# Patient Record
Sex: Female | Born: 1965 | Race: White | Hispanic: No | Marital: Married | Smoking: Former smoker
Health system: Southern US, Community
[De-identification: ages and names within clinical notes are randomized; demographics above are authoritative.]

## PROBLEM LIST (undated history)

## (undated) DIAGNOSIS — F419 Anxiety disorder, unspecified: Secondary | ICD-10-CM

## (undated) DIAGNOSIS — K219 Gastro-esophageal reflux disease without esophagitis: Secondary | ICD-10-CM

## (undated) DIAGNOSIS — R011 Cardiac murmur, unspecified: Secondary | ICD-10-CM

## (undated) DIAGNOSIS — Z8719 Personal history of other diseases of the digestive system: Secondary | ICD-10-CM

## (undated) DIAGNOSIS — E669 Obesity, unspecified: Secondary | ICD-10-CM

## (undated) DIAGNOSIS — R112 Nausea with vomiting, unspecified: Secondary | ICD-10-CM

## (undated) DIAGNOSIS — D649 Anemia, unspecified: Secondary | ICD-10-CM

## (undated) DIAGNOSIS — E785 Hyperlipidemia, unspecified: Secondary | ICD-10-CM

## (undated) DIAGNOSIS — T7840XA Allergy, unspecified, initial encounter: Secondary | ICD-10-CM

## (undated) DIAGNOSIS — H269 Unspecified cataract: Secondary | ICD-10-CM

## (undated) DIAGNOSIS — Z86018 Personal history of other benign neoplasm: Secondary | ICD-10-CM

## (undated) DIAGNOSIS — E119 Type 2 diabetes mellitus without complications: Secondary | ICD-10-CM

## (undated) DIAGNOSIS — Z9889 Other specified postprocedural states: Secondary | ICD-10-CM

## (undated) HISTORY — DX: Anemia, unspecified: D64.9

## (undated) HISTORY — DX: Gastro-esophageal reflux disease without esophagitis: K21.9

## (undated) HISTORY — PX: MANDIBLE FRACTURE SURGERY: SHX706

## (undated) HISTORY — DX: Personal history of other benign neoplasm: Z86.018

## (undated) HISTORY — DX: Hyperlipidemia, unspecified: E78.5

## (undated) HISTORY — DX: Unspecified cataract: H26.9

## (undated) HISTORY — DX: Allergy, unspecified, initial encounter: T78.40XA

## (undated) HISTORY — DX: Cardiac murmur, unspecified: R01.1

## (undated) HISTORY — PX: CLEFT LIP REPAIR: SUR1164

## (undated) HISTORY — DX: Obesity, unspecified: E66.9

## (undated) HISTORY — DX: Type 2 diabetes mellitus without complications: E11.9

## (undated) HISTORY — PX: CATARACT EXTRACTION: SUR2

## (undated) HISTORY — PX: COLONOSCOPY: SHX174

---

## 1997-03-23 HISTORY — PX: ASD REPAIR: SHX258

## 1997-08-07 ENCOUNTER — Ambulatory Visit (HOSPITAL_COMMUNITY): Admission: RE | Admit: 1997-08-07 | Discharge: 1997-08-07 | Payer: Self-pay | Admitting: Cardiovascular Disease

## 1997-08-16 ENCOUNTER — Inpatient Hospital Stay (HOSPITAL_COMMUNITY)
Admission: RE | Admit: 1997-08-16 | Discharge: 1997-08-19 | Payer: Self-pay | Admitting: Thoracic Surgery (Cardiothoracic Vascular Surgery)

## 2001-05-23 ENCOUNTER — Encounter: Payer: Self-pay | Admitting: Family Medicine

## 2001-05-23 LAB — CONVERTED CEMR LAB
RBC count: 4.12 10*6/uL
WBC, blood: 6.2 10*3/uL

## 2004-01-29 ENCOUNTER — Ambulatory Visit: Payer: Self-pay | Admitting: Gastroenterology

## 2004-04-08 ENCOUNTER — Ambulatory Visit: Payer: Self-pay | Admitting: Gastroenterology

## 2004-06-24 ENCOUNTER — Ambulatory Visit: Payer: Self-pay | Admitting: Family Medicine

## 2004-07-07 ENCOUNTER — Ambulatory Visit: Payer: Self-pay | Admitting: Gastroenterology

## 2004-10-21 ENCOUNTER — Ambulatory Visit: Payer: Self-pay | Admitting: Unknown Physician Specialty

## 2005-02-20 ENCOUNTER — Encounter: Payer: Self-pay | Admitting: Family Medicine

## 2005-02-20 LAB — HM MAMMOGRAPHY

## 2005-03-03 ENCOUNTER — Ambulatory Visit: Payer: Self-pay | Admitting: Gastroenterology

## 2005-03-30 ENCOUNTER — Ambulatory Visit: Payer: Self-pay | Admitting: Family Medicine

## 2005-04-27 ENCOUNTER — Ambulatory Visit: Payer: Self-pay | Admitting: Family Medicine

## 2005-07-02 ENCOUNTER — Ambulatory Visit: Payer: Self-pay | Admitting: Family Medicine

## 2005-07-10 ENCOUNTER — Ambulatory Visit: Payer: Self-pay | Admitting: Family Medicine

## 2005-07-10 LAB — CONVERTED CEMR LAB
Blood Glucose, Fasting: 115 mg/dL
RBC count: 4.21 10*6/uL
WBC, blood: 5.2 10*3/uL

## 2005-08-31 ENCOUNTER — Ambulatory Visit: Payer: Self-pay | Admitting: Internal Medicine

## 2005-10-22 ENCOUNTER — Ambulatory Visit: Payer: Self-pay | Admitting: Unknown Physician Specialty

## 2006-03-25 ENCOUNTER — Ambulatory Visit: Payer: Self-pay | Admitting: Family Medicine

## 2006-04-20 ENCOUNTER — Ambulatory Visit: Payer: Self-pay | Admitting: Gastroenterology

## 2006-04-20 LAB — CONVERTED CEMR LAB
Basophils Absolute: 0.1 10*3/uL (ref 0.0–0.1)
Eosinophils Absolute: 0.2 10*3/uL (ref 0.0–0.6)
HCT: 32.5 % — ABNORMAL LOW (ref 36.0–46.0)
Hemoglobin: 11.7 g/dL — ABNORMAL LOW (ref 12.0–15.0)
Iron: 20 ug/dL — ABNORMAL LOW (ref 42–145)
Lymphocytes Relative: 32.8 % (ref 12.0–46.0)
MCHC: 35.9 g/dL (ref 30.0–36.0)
MCV: 86 fL (ref 78.0–100.0)
Monocytes Absolute: 0.8 10*3/uL — ABNORMAL HIGH (ref 0.2–0.7)
Monocytes Relative: 9 % (ref 3.0–11.0)
Neutro Abs: 4.6 10*3/uL (ref 1.4–7.7)
RDW: 14.8 % — ABNORMAL HIGH (ref 11.5–14.6)
WBC: 8.5 10*3/uL (ref 4.5–10.5)

## 2006-10-20 ENCOUNTER — Ambulatory Visit: Payer: Self-pay | Admitting: Family Medicine

## 2006-10-20 DIAGNOSIS — L255 Unspecified contact dermatitis due to plants, except food: Secondary | ICD-10-CM | POA: Insufficient documentation

## 2006-11-04 ENCOUNTER — Ambulatory Visit: Payer: Self-pay | Admitting: Unknown Physician Specialty

## 2006-11-08 ENCOUNTER — Ambulatory Visit: Payer: Self-pay | Admitting: Family Medicine

## 2007-03-30 ENCOUNTER — Ambulatory Visit: Payer: Self-pay | Admitting: Family Medicine

## 2007-03-30 DIAGNOSIS — B009 Herpesviral infection, unspecified: Secondary | ICD-10-CM | POA: Insufficient documentation

## 2007-05-02 ENCOUNTER — Ambulatory Visit: Payer: Self-pay | Admitting: Family Medicine

## 2007-05-03 ENCOUNTER — Encounter: Payer: Self-pay | Admitting: Family Medicine

## 2007-05-03 DIAGNOSIS — K219 Gastro-esophageal reflux disease without esophagitis: Secondary | ICD-10-CM | POA: Insufficient documentation

## 2007-05-03 DIAGNOSIS — Q211 Atrial septal defect: Secondary | ICD-10-CM | POA: Insufficient documentation

## 2007-05-03 DIAGNOSIS — Q375 Cleft hard and soft palate with unilateral cleft lip: Secondary | ICD-10-CM | POA: Insufficient documentation

## 2007-05-03 DIAGNOSIS — Z8679 Personal history of other diseases of the circulatory system: Secondary | ICD-10-CM

## 2007-05-03 DIAGNOSIS — Q2111 Secundum atrial septal defect: Secondary | ICD-10-CM | POA: Insufficient documentation

## 2007-05-03 HISTORY — DX: Cleft hard and soft palate with unilateral cleft lip: Q37.5

## 2007-05-03 HISTORY — DX: Personal history of other diseases of the circulatory system: Z86.79

## 2007-07-05 ENCOUNTER — Ambulatory Visit: Payer: Self-pay | Admitting: Gastroenterology

## 2007-07-05 LAB — CONVERTED CEMR LAB
ALT: 12 units/L (ref 0–35)
Basophils Absolute: 0.1 10*3/uL (ref 0.0–0.1)
Bilirubin, Direct: 0.1 mg/dL (ref 0.0–0.3)
Calcium: 8.9 mg/dL (ref 8.4–10.5)
Creatinine, Ser: 0.6 mg/dL (ref 0.4–1.2)
Eosinophils Absolute: 0.1 10*3/uL (ref 0.0–0.7)
Folate: 9.9 ng/mL
GFR calc Af Amer: 140 mL/min
Glucose, Bld: 98 mg/dL (ref 70–99)
HCT: 35.7 % — ABNORMAL LOW (ref 36.0–46.0)
Iron: 21 ug/dL — ABNORMAL LOW (ref 42–145)
MCHC: 34 g/dL (ref 30.0–36.0)
MCV: 87.9 fL (ref 78.0–100.0)
Monocytes Absolute: 0.5 10*3/uL (ref 0.1–1.0)
Neutrophils Relative %: 59.9 % (ref 43.0–77.0)
Platelets: 235 10*3/uL (ref 150–400)
Saturation Ratios: 5.1 % — ABNORMAL LOW (ref 20.0–50.0)
Sodium: 139 meq/L (ref 135–145)
Tissue Transglutaminase Ab, IgA: 0.4 units (ref <7)
Total Bilirubin: 0.3 mg/dL (ref 0.3–1.2)
Total Protein: 7.1 g/dL (ref 6.0–8.3)
Transferrin: 291.9 mg/dL (ref >=212.0)

## 2007-07-20 ENCOUNTER — Ambulatory Visit: Payer: Self-pay | Admitting: Family Medicine

## 2007-10-18 ENCOUNTER — Ambulatory Visit: Payer: Self-pay | Admitting: Family Medicine

## 2007-11-17 ENCOUNTER — Ambulatory Visit: Payer: Self-pay | Admitting: Unknown Physician Specialty

## 2008-04-03 ENCOUNTER — Ambulatory Visit: Payer: Self-pay | Admitting: Family Medicine

## 2008-05-17 ENCOUNTER — Encounter: Payer: Self-pay | Admitting: Family Medicine

## 2008-07-09 DIAGNOSIS — K449 Diaphragmatic hernia without obstruction or gangrene: Secondary | ICD-10-CM | POA: Insufficient documentation

## 2008-07-09 HISTORY — DX: Diaphragmatic hernia without obstruction or gangrene: K44.9

## 2008-07-10 ENCOUNTER — Ambulatory Visit: Payer: Self-pay | Admitting: Gastroenterology

## 2008-07-10 DIAGNOSIS — Z862 Personal history of diseases of the blood and blood-forming organs and certain disorders involving the immune mechanism: Secondary | ICD-10-CM | POA: Insufficient documentation

## 2008-07-17 ENCOUNTER — Ambulatory Visit: Payer: Self-pay | Admitting: Gastroenterology

## 2008-07-31 ENCOUNTER — Encounter: Payer: Self-pay | Admitting: Family Medicine

## 2008-09-03 ENCOUNTER — Ambulatory Visit: Payer: Self-pay | Admitting: Family Medicine

## 2008-09-03 DIAGNOSIS — J019 Acute sinusitis, unspecified: Secondary | ICD-10-CM | POA: Insufficient documentation

## 2008-09-25 ENCOUNTER — Ambulatory Visit: Payer: Self-pay | Admitting: Family Medicine

## 2008-10-11 ENCOUNTER — Ambulatory Visit: Payer: Self-pay | Admitting: Gastroenterology

## 2008-10-11 LAB — CONVERTED CEMR LAB
Basophils Absolute: 0 10*3/uL (ref 0.0–0.1)
Basophils Relative: 0.4 % (ref 0.0–3.0)
Eosinophils Absolute: 0.1 10*3/uL (ref 0.0–0.7)
Folate: 20 ng/mL
HCT: 34.1 % — ABNORMAL LOW (ref 36.0–46.0)
Hemoglobin: 11.7 g/dL — ABNORMAL LOW (ref 12.0–15.0)
Lymphocytes Relative: 40.2 % (ref 12.0–46.0)
Lymphs Abs: 2.4 10*3/uL (ref 0.7–4.0)
MCHC: 34.3 g/dL (ref 30.0–36.0)
MCV: 88.7 fL (ref 78.0–100.0)
Monocytes Absolute: 0.4 10*3/uL (ref 0.1–1.0)
Neutro Abs: 3 10*3/uL (ref 1.4–7.7)
RBC: 3.84 M/uL — ABNORMAL LOW (ref 3.87–5.11)
RDW: 14 % (ref 11.5–14.6)

## 2008-10-23 ENCOUNTER — Ambulatory Visit: Payer: Self-pay | Admitting: Ophthalmology

## 2008-10-23 ENCOUNTER — Ambulatory Visit: Payer: Self-pay | Admitting: Family Medicine

## 2008-10-23 DIAGNOSIS — H1010 Acute atopic conjunctivitis, unspecified eye: Secondary | ICD-10-CM | POA: Insufficient documentation

## 2008-11-06 ENCOUNTER — Encounter: Payer: Self-pay | Admitting: Family Medicine

## 2008-11-15 ENCOUNTER — Encounter: Payer: Self-pay | Admitting: Family Medicine

## 2008-11-23 ENCOUNTER — Encounter: Payer: Self-pay | Admitting: Family Medicine

## 2008-11-28 ENCOUNTER — Ambulatory Visit: Payer: Self-pay | Admitting: Unknown Physician Specialty

## 2009-06-21 ENCOUNTER — Ambulatory Visit: Payer: Self-pay | Admitting: Gastroenterology

## 2009-06-21 ENCOUNTER — Encounter (INDEPENDENT_AMBULATORY_CARE_PROVIDER_SITE_OTHER): Payer: Self-pay | Admitting: *Deleted

## 2009-06-21 DIAGNOSIS — K589 Irritable bowel syndrome without diarrhea: Secondary | ICD-10-CM | POA: Insufficient documentation

## 2009-06-21 DIAGNOSIS — D509 Iron deficiency anemia, unspecified: Secondary | ICD-10-CM | POA: Insufficient documentation

## 2009-06-21 HISTORY — DX: Irritable bowel syndrome, unspecified: K58.9

## 2009-06-21 LAB — CONVERTED CEMR LAB
AST: 15 units/L (ref 0–37)
Albumin: 3.8 g/dL (ref 3.5–5.2)
Alkaline Phosphatase: 44 units/L (ref 39–117)
BUN: 8 mg/dL (ref 6–23)
Basophils Relative: 0.2 % (ref 0.0–3.0)
CO2: 27 meq/L (ref 19–32)
Calcium: 8.8 mg/dL (ref 8.4–10.5)
Eosinophils Relative: 0.7 % (ref 0.0–5.0)
Ferritin: 30.5 ng/mL (ref 10.0–291.0)
GFR calc non Af Amer: 96.09 mL/min (ref >60)
Glucose, Bld: 131 mg/dL — ABNORMAL HIGH (ref 70–99)
HCT: 37.1 % (ref 36.0–46.0)
Hemoglobin: 13 g/dL (ref 12.0–15.0)
Lymphs Abs: 2.2 10*3/uL (ref 0.7–4.0)
MCV: 91.7 fL (ref 78.0–100.0)
Monocytes Absolute: 0.6 10*3/uL (ref 0.1–1.0)
Monocytes Relative: 7.8 % (ref 3.0–12.0)
Platelets: 195 10*3/uL (ref 150.0–400.0)
RBC: 4.05 M/uL (ref 3.87–5.11)
Saturation Ratios: 26.6 % (ref 20.0–50.0)
Sodium: 134 meq/L — ABNORMAL LOW (ref 135–145)
TSH: 2.67 microintl units/mL (ref 0.35–5.50)
Total Protein: 7.1 g/dL (ref 6.0–8.3)
WBC: 7.2 10*3/uL (ref 4.5–10.5)

## 2009-07-16 ENCOUNTER — Telehealth: Payer: Self-pay | Admitting: Gastroenterology

## 2009-07-17 ENCOUNTER — Ambulatory Visit: Payer: Self-pay | Admitting: Gastroenterology

## 2009-07-22 ENCOUNTER — Encounter: Payer: Self-pay | Admitting: Gastroenterology

## 2009-10-04 ENCOUNTER — Ambulatory Visit: Payer: Self-pay | Admitting: Family Medicine

## 2009-10-04 DIAGNOSIS — H6120 Impacted cerumen, unspecified ear: Secondary | ICD-10-CM | POA: Insufficient documentation

## 2009-10-29 ENCOUNTER — Encounter (INDEPENDENT_AMBULATORY_CARE_PROVIDER_SITE_OTHER): Payer: Self-pay | Admitting: *Deleted

## 2009-12-24 ENCOUNTER — Ambulatory Visit: Payer: Self-pay | Admitting: Unknown Physician Specialty

## 2010-01-15 ENCOUNTER — Ambulatory Visit: Payer: Self-pay | Admitting: Family Medicine

## 2010-01-15 DIAGNOSIS — N951 Menopausal and female climacteric states: Secondary | ICD-10-CM | POA: Insufficient documentation

## 2010-01-15 DIAGNOSIS — F4323 Adjustment disorder with mixed anxiety and depressed mood: Secondary | ICD-10-CM | POA: Insufficient documentation

## 2010-01-15 HISTORY — DX: Menopausal and female climacteric states: N95.1

## 2010-01-16 ENCOUNTER — Telehealth: Payer: Self-pay | Admitting: Family Medicine

## 2010-01-16 LAB — CONVERTED CEMR LAB
LH: 3.83 milliintl units/mL
TSH: 1.64 microintl units/mL (ref 0.35–5.50)

## 2010-01-17 ENCOUNTER — Encounter: Payer: Self-pay | Admitting: Family Medicine

## 2010-01-20 ENCOUNTER — Telehealth: Payer: Self-pay | Admitting: Family Medicine

## 2010-01-27 ENCOUNTER — Ambulatory Visit: Payer: Self-pay | Admitting: Family Medicine

## 2010-03-11 ENCOUNTER — Ambulatory Visit: Payer: Self-pay | Admitting: Family Medicine

## 2010-03-11 DIAGNOSIS — J069 Acute upper respiratory infection, unspecified: Secondary | ICD-10-CM | POA: Insufficient documentation

## 2010-04-16 ENCOUNTER — Ambulatory Visit
Admission: RE | Admit: 2010-04-16 | Discharge: 2010-04-16 | Payer: Self-pay | Source: Home / Self Care | Attending: Family Medicine | Admitting: Family Medicine

## 2010-04-22 NOTE — Procedures (Signed)
Summary: Colonoscopy  Patient: Hannah Rocha Note: All result statuses are Final unless otherwise noted.  Tests: (1) Colonoscopy (COL)   COL Colonoscopy           DONE     [facility]     [address]     [place], [place]  [address]           COLONOSCOPY PROCEDURE REPORT           PATIENT:  Hannah Rocha, Hannah Rocha  MR#:  [id number]     BIRTHDATE:  [birth year], 44 yrs. old  GENDER:  female     ENDOSCOPIST:  Vania Rea. Jarold Motto, MD, [facility]     REF. BY:     PROCEDURE DATE:  07/17/2009     PROCEDURE:  Higher-risk screening colonoscopy G0105           ASA CLASS:  Class II     INDICATIONS:  Elevated Risk Screening     MEDICATIONS:   Fentanyl 75 mcg IV, Versed 8 mg PO           DESCRIPTION OF PROCEDURE:   After the risks benefits and     alternatives of the procedure were thoroughly explained, informed     consent was obtained.  Digital rectal exam was performed and     revealed no abnormalities.   The LB CF-H180AL [id number] endoscope     was introduced through the anus and advanced to the cecum, which     was identified by both the appendix and ileocecal valve, without     limitations.  The quality of the prep was excellent, using     MoviPrep.  The instrument was then slowly withdrawn as the colon     was fully examined.     <<PROCEDUREIMAGES>>           FINDINGS:  No polyps or cancers were seen.  This was otherwise a     normal examination of the colon.   Retroflexed views in the rectum     revealed no abnormalities.    The scope was then withdrawn from     the patient and the procedure completed.           COMPLICATIONS:  None     ENDOSCOPIC IMPRESSION:     1) No polyps or cancers     2) Otherwise normal examination     RECOMMENDATIONS:     1) Continue current colorectal screening recommendations for     "routine risk" patients with a repeat colonoscopy in 10 years.     REPEAT EXAM:  No           ______________________________     Vania Rea. Jarold Motto, MD, [facility]            CC:           n.     eSIGNED:   Vania Rea. Sohrab Keelan at 07/17/2009 02:57 PM           Tamala Ser, [id number]  Note: An exclamation mark (!) indicates a result that was not dispersed into the flowsheet. Document Creation Date: 07/17/2009 2:58 PM _______________________________________________________________________  (1) Order result status: Final Collection or observation date-time: 07/17/2009 14:53 Requested date-time:  Receipt date-time:  Reported date-time:  Referring Physician:   Ordering Physician: Sheryn Bison [phone]) Specimen Source:  Source: Launa Grill Order Number: [id number] Lab site:   Appended Document: Colonoscopy    Clinical Lists Changes  Observations: Added new observation of COLONNXTDUE: 06/2019 (07/17/2009  15:11)     

## 2010-04-22 NOTE — Letter (Signed)
Summary: Nadara Eaton letter  [facility]  [address] [place], [place] [address]   Phone: [phone]  Fax: [phone]       10/29/2009 MRN: [id number]  [facility] [address] [place], [place]  [address]  Dear Ms. Dan Humphreys,  [facility] [place], and [facility] announce the retirement of Arta Silence, M.D., from full-time practice at the [facility] office effective September 19, 2009 and his plans of returning part-time.  It is important to Dr. Hetty Ely and to our practice that you understand that [facility] has seven physicians in our office for your health care needs.  We will continue to offer the same exceptional care that you have today.    Dr. Hetty Ely has spoken to many of you about his plans for retirement and returning part-time in the fall.   We will continue to work with you through the transition to schedule appointments for you in the office and meet the high standards that [place] is committed to.   Again, it is with great pleasure that we share the news that Dr. Hetty Ely will return to [facility] in October of 2011 with a reduced schedule.    If you have any questions, or would like to request an appointment with one of our physicians, please call us at [phone] and press the option for Scheduling an appointment.  We take pleasure in providing you with excellent patient care and look forward to seeing you at your next office visit.  Our [facility] Physicians are:  Tillman Abide, M.D. Laurita Quint, M.D. Roxy Manns, M.D. Kerby Nora, M.D. Hannah Beat, M.D. Ruthe Mannan, M.D. We proudly welcomed Raechel Ache, M.D. and Eustaquio Boyden, M.D. to the practice in July/August 2011.  Sincerely,  [facility]

## 2010-04-22 NOTE — Progress Notes (Signed)
Summary: prep ?'s  Phone Note Call from Patient Call back at [phone]   Caller: Patient Call For: Dr. Jarold Motto Reason for Call: Talk to Nurse Summary of Call: pt has COL tomorrow and has general ?'s about prep Initial call taken by: Vallarie Mare,  July 16, 2009 9:29 AM  Follow-up for Phone Call        Returned phone call to the patient she had questions regaurding if she could mix powdered  gator-aid with the movi prep which had already been mixed with 1 pouch A & 1 pouch B and with water.  I asked Dixie Doss, RN and she advised against adding gator-aid powder with the already mixed movi prep.  I suggested she might try to suck on hard candy if the taste was a problem.  Pt states she understands these instructions.  Pt asked what to do if she has problems with the prep if after hours.  I gave the pt. [phone] to the answering service if she needs to speak with the on call MD.  Pt has no further questions. Follow-up by: Eual Fines RN,  July 16, 2009 1:36 PM

## 2010-04-22 NOTE — Letter (Signed)
Summary: [facility] Instructions  [facility]  [address] [place], [place] [address]   Phone: [phone]  Fax: [phone]       Hannah Rocha    [birth year]    MRN: [id number]        Procedure Day /Date: Friday, 07/19/09     Arrival Time: 1:30      Procedure Time: 2:30     Location of Procedure:                    X_ _  [facility] (4th Floor)                        PREPARATION FOR COLONOSCOPY WITH MOVIPREP   Starting 5 days prior to your procedure 07/14/09 do not eat nuts, seeds, popcorn, corn, beans, peas,  salads, or any raw vegetables.  Do not take any fiber supplements (e.g. Metamucil, Citrucel, and Benefiber).  THE DAY BEFORE YOUR PROCEDURE         DATE: 07/18/09    DAY: Thursday  1.  Drink clear liquids the entire day-NO SOLID FOOD  2.  Do not drink anything colored red or purple.  Avoid juices with pulp.  No orange juice.  3.  Drink at least 64 oz. (8 glasses) of fluid/clear liquids during the day to prevent dehydration and help the prep work efficiently.  CLEAR LIQUIDS INCLUDE: Water Jello Ice Popsicles Tea (sugar ok, no milk/cream) Powdered fruit flavored drinks Coffee (sugar ok, no milk/cream) Gatorade Juice: apple, white grape, white cranberry  Lemonade Clear bullion, consomm, broth Carbonated beverages (any kind) Strained chicken noodle soup Hard Candy                             4.  In the morning, mix first dose of MoviPrep solution:    Empty 1 Pouch A and 1 Pouch B into the disposable container    Add lukewarm drinking water to the top line of the container. Mix to dissolve    Refrigerate (mixed solution should be used within 24 hrs)  5.  Begin drinking the prep at 5:00 p.m. The MoviPrep container is divided by 4 marks.   Every 15 minutes drink the solution down to the next mark (approximately 8 oz) until the full liter is complete.   6.  Follow completed prep with 16 oz of clear liquid of your choice (Nothing  red or purple).  Continue to drink clear liquids until bedtime.  7.  Before going to bed, mix second dose of MoviPrep solution:    Empty 1 Pouch A and 1 Pouch B into the disposable container    Add lukewarm drinking water to the top line of the container. Mix to dissolve    Refrigerate  THE DAY OF YOUR PROCEDURE      DATE: 07/19/09   DAY: Friday  Beginning at 9:30 a.m. (5 hours before procedure):         1. Every 15 minutes, drink the solution down to the next mark (approx 8 oz) until the full liter is complete.  2. Follow completed prep with 16 oz. of clear liquid of your choice.    3. You may drink clear liquids until 12:30  (2 HOURS BEFORE PROCEDURE).   MEDICATION INSTRUCTIONS  Unless otherwise instructed, you should take regular prescription medications with a small sip of water   as early as possible  the morning of your procedure.                  OTHER INSTRUCTIONS  You will need a responsible adult at least 46 years of age to accompany you and drive you home.   This person must remain in the waiting room during your procedure.  Wear loose fitting clothing that is easily removed.  Leave jewelry and other valuables at home.  However, you may wish to bring a book to read or  an iPod/MP3 player to listen to music as you wait for your procedure to start.  Remove all body piercing jewelry and leave at home.  Total time from sign-in until discharge is approximately 2-3 hours.  You should go home directly after your procedure and rest.  You can resume normal activities the  day after your procedure.  The day of your procedure you should not:   Drive   Make legal decisions   Operate machinery   Drink alcohol   Return to work  You will receive specific instructions about eating, activities and medications before you leave.    The above instructions have been reviewed and explained to me by   _______________________    I fully understand and can  verbalize these instructions _____________________________ Date _________

## 2010-04-22 NOTE — Letter (Signed)
Summary: Patient Notice-Endo Biopsy Results  [facility]  [address] [place], [place] [address]   Phone: [phone]  Fax: [phone]        Jul 22, 2009 MRN: [id number]    [facility] [address] [place], [place]  [address]    Dear Ms. Dan Humphreys,  I am pleased to inform you that the biopsies taken during your recent endoscopic examination did not show any evidence of cancer upon pathologic examination.  Additional information/recommendations:  __No further action is needed at this time.  Please follow-up with      your primary care physician for your other healthcare needs.  __ Please call [phone] to schedule a return visit to review      your condition.  XX__ Continue with the treatment plan as outlined on the day of your      exam.  __ You should have a repeat endoscopic examination for this problem              in _ months/years.   Please call us if you are having persistent problems or have questions about your condition that have not been fully answered at this time.  Sincerely,  Mardella Layman MD [facility]  This letter has been electronically signed by your physician.  Appended Document: Patient Notice-Endo Biopsy Results letter mailed 5.5.11

## 2010-04-22 NOTE — Procedures (Signed)
Summary: Upper Endoscopy  Patient: Hannah Rocha Note: All result statuses are Final unless otherwise noted.  Tests: (1) Upper Endoscopy (EGD)   EGD Upper Endoscopy       DONE     [facility]     [address]     [place], [place]  [address]           ENDOSCOPY PROCEDURE REPORT           PATIENT:  [name], [name]  MR#:  [id number]     BIRTHDATE:  09/16/1965, 44 yrs. old  GENDER:  female           ENDOSCOPIST:  Vania Rea. Jarold Motto, MD, [facility]     Referred by:           PROCEDURE DATE:  07/17/2009     PROCEDURE:  EGD with biopsy     ASA CLASS:  Class II     INDICATIONS:  CHRONIC GERD           MEDICATIONS:   There was residual sedation effect present from     prior procedure., Fentanyl 25 mcg IV, Versed 2 mg IV     TOPICAL ANESTHETIC:           DESCRIPTION OF PROCEDURE:   After the risks benefits and     alternatives of the procedure were thoroughly explained, informed     consent was obtained.  The [facility] GIF-H180 [id number] endoscope was     introduced through the mouth and advanced to the second portion of     the duodenum, without limitations.  The instrument was slowly     withdrawn as the mucosa was fully examined.     <<PROCEDUREIMAGES>>           ULTRASONIC FINDINGS:   A hiatal hernia was found.  CM. HH NOTED.     Normal GE junction was noted.  There were multiple polyps     identified. ENTIRE POLYP REMOVED FOR EXAM.MULTIPLE BENIGN FUNDAL     HYPERPLASTIC POLYPS NOTED.  The duodenal bulb was normal in     appearance, as was the postbulbar duodenum.    POLYPS  The scope     was then withdrawn from the patient and the procedure completed.           COMPLICATIONS:  None           ENDOSCOPIC IMPRESSION:     1) Normal GE junction     2) Hiatal hernia     3) Polyps, multiple     4) Normal duodenum     1. NO BARRETT'S MUCOSA     2. 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     RECOMMENDATIONS:     1) Await biopsy results     2) continue current medications        REPEAT EXAM:  No           ______________________________     Vania Rea. Jarold Motto, MD, [facility]           CC:           n.     eSIGNED:   Vania Rea. Patterson at 07/17/2009 03:09 PM           Tamala Ser, [id number]  Note: An exclamation mark (!) indicates a result that was not dispersed into the flowsheet. Document Creation Date: 07/17/2009 3:09 PM _______________________________________________________________________  (1) Order result status: Final Collection or observation date-time: 07/17/2009 15:03 Requested date-time:  Receipt date-time:  Reported date-time:  Referring Physician:   Ordering Physician: Sheryn Bison [phone]) Specimen Source:  Source: Launa Grill Order Number: [id number] Lab site:

## 2010-04-22 NOTE — Assessment & Plan Note (Signed)
Summary: DISCUSS MEDICATION/CLE   Vital Signs:  Patient profile:   45 year old female Height:      64 inches Weight:      171 pounds BMI:     29.46 Temp:     98.6 degrees F oral Pulse rate:   76 / minute Pulse rhythm:   regular BP sitting:   150 / 90  (right arm) Cuff size:   regular  Vitals Entered By: [name] CMA Duncan Dull) (January 27, 2010 3:14 PM) CC: discuss medications   History of Present Illness: 45 yo here to to discuss anxiety/depression.  Saw her on 01/15/2010, started on Zoloft 50 mg daily for anxiety and tearfulness. Stopped taking it after 9 days, made her to sick on her stomach and actually made her more nervous. Referred for psychotherapy, but she could not afford to go.  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 Father is doing better, now has [employer].  Does still have what she thinks are panic attacks- starts thinking about things and gets very tearful and hyperventilates.  Talked to her husband about it and they came up with strategies to calm herself down. No SI or HI.    Current Medications (verified): 1)  Protonix 40 Mg  Tbec (Pantoprazole Sodium) .... Take One By Mouth Daily 2)  Zovirax 5 %  Crea (Acyclovir) .... Apply 5x Per Day For Four Days As Needed. 3)  Necon 1/35 (28) 1-35 Mg-Mcg  Tabs (Norethindrone-Eth Estradiol) .... Take As Directed 4)  Tandem Plus 162-115.2-1 Mg  Caps (Fefum-Fepo-Fa-B Cmp-C-Zn-Mn-Cu) .... Take One Capsule By Mouth Daily 5)  Protonix 40 Mg Tbec (Pantoprazole Sodium) .... Take 1 Tablet By Mouth Once A Day 6)  Bcp .... Use As Directed 7)  Omnicef 300 Mg Caps (Cefdinir) .... 2 Tabs By Mouth Once A Day 8)  Protonix 40 Mg  Tbec (Pantoprazole Sodium) .Marland Kitchen.. 1 Each Day 30 Minutes Before Meal 9)  Loestrin 24 Fe 1-20 Mg-Mcg Tabs (Norethin Ace-Eth Estrad-Fe) .... One Tablet By Mouth Once Daily 10)  Alavert 10 Mg Tabs (Loratadine) .... One Tablet By Mouth Once Daily 11)  Se-Tan Plus 162-115.2-1 Mg Caps  (Fefum-Fepo-Fa-B Cmp-C-Zn-Mn-Cu) .... Take One Tablet By Mouth Daily 12)  Alprazolam 0.25 Mg Tabs (Alprazolam) .Marland Kitchen.. 1 Tab By Mouth Three Times A Day As Needed Anxiety.  Allergies (verified): 1)  ! Zoloft  Review of Systems      See HPI Psych:  Complains of anxiety, easily tearful, and panic attacks; denies depression, suicidal thoughts/plans, thoughts of violence, unusual visions or sounds, and thoughts /plans of harming others.  Physical Exam  General:  Well developed, well nourished, no acute distress.healthy appearing.   Psych:  Cognition and judgment appear intact. Alert and cooperative with normal attention span and concentration. No apparent delusions, illusions, hallucinations Tearful but very appropriate, anxious.     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 Has more difficulty with anxiety than depression. Discussed Buspar, I feel she would do quite well on that but she would like to try to deal with it on her own first. Does want something in case she has a severe panic attack because when she becomes short of breath, it really scares her. Will use very low dose as needed alprazolam but explained this is not meant for daily or  chronic use.  Pt in agreement with plan, will call me in a few weeks.  If she is still as anxious, she will try Buspar.  Complete Medication List: 1)  Protonix 40 Mg Tbec (Pantoprazole sodium) .... Take one by mouth daily 2)  Zovirax 5 % Crea (Acyclovir) .... Apply 5x per day for four days as needed. 3)  Necon 1/35 (28) 1-35 Mg-mcg Tabs (Norethindrone-eth estradiol) .... Take as directed 4)  Tandem Plus 162-115.2-1 Mg Caps (Fefum-fepo-fa-b cmp-c-zn-mn-cu) .... Take one capsule by mouth daily 5)  Protonix 40 Mg Tbec (Pantoprazole sodium) .... Take 1 tablet by  mouth once a day 6)  Bcp  .... Use as directed 7)  Omnicef 300 Mg Caps (Cefdinir) .... 2 tabs by mouth once a day 8)  Protonix 40 Mg Tbec (Pantoprazole sodium) .Marland Kitchen.. 1 each day 30 minutes before meal 9)  Loestrin 24 Fe 1-20 Mg-mcg Tabs (Norethin ace-eth estrad-fe) .... One tablet by mouth once daily 10)  Alavert 10 Mg Tabs (Loratadine) .... One tablet by mouth once daily 11)  Se-tan Plus 162-115.2-1 Mg Caps (Fefum-fepo-fa-b cmp-c-zn-mn-cu) .... Take one tablet by mouth daily 12)  Alprazolam 0.25 Mg Tabs (Alprazolam) .Marland Kitchen.. 1 tab by mouth three times a day as needed anxiety. Prescriptions: ALPRAZOLAM 0.25 MG TABS (ALPRAZOLAM) 1 tab by mouth three times a day as needed anxiety.  #30 x 0   Entered and Authorized by:   Ruthe Mannan MD   Signed by:   Ruthe Mannan MD on 01/27/2010   Method used:   Print then Give to Patient   RxID:   [id number]    Orders Added: 1)  Est. Patient Level IV [14782]    Current Allergies (reviewed today): ! ZOLOFT

## 2010-04-22 NOTE — Assessment & Plan Note (Signed)
Summary: female issues   Vital Signs:  Patient profile:   45 year old female Height:      64 inches Weight:      173 pounds BMI:     29.80 Temp:     98.2 degrees F oral Pulse rate:   80 / minute Pulse rhythm:   regular BP sitting:   130 / 82  (right arm) Cuff size:   regular  Vitals Entered By: [name] CMA Duncan Dull) (January 15, 2010 11:59 AM) CC: personal issues, menopause questions   History of Present Illness: 45 yo here for ?menopause.  Last several months, has had 2-3 months between periods, sometimes very light and othertimes very heavy.  Was placed on OCPs by her OBGYN. Discussed these symptoms with him as well. Lately, has been very tearful, difficulty sleeping and concentrating.  Very anxious.  Some vaginal dryness and hot sweats.    Current Medications (verified): 1)  Protonix 40 Mg  Tbec (Pantoprazole Sodium) .... Take One By Mouth Daily 2)  Zovirax 5 %  Crea (Acyclovir) .... Apply 5x Per Day For Four Days As Needed. 3)  Necon 1/35 (28) 1-35 Mg-Mcg  Tabs (Norethindrone-Eth Estradiol) .... Take As Directed 4)  Tandem Plus 162-115.2-1 Mg  Caps (Fefum-Fepo-Fa-B Cmp-C-Zn-Mn-Cu) .... Take One Capsule By Mouth Daily 5)  Protonix 40 Mg Tbec (Pantoprazole Sodium) .... Take 1 Tablet By Mouth Once A Day 6)  Bcp .... Use As Directed 7)  Omnicef 300 Mg Caps (Cefdinir) .... 2 Tabs By Mouth Once A Day 8)  Protonix 40 Mg  Tbec (Pantoprazole Sodium) .Marland Kitchen.. 1 Each Day 30 Minutes Before Meal 9)  Loestrin 24 Fe 1-20 Mg-Mcg Tabs (Norethin Ace-Eth Estrad-Fe) .... One Tablet By Mouth Once Daily 10)  Alavert 10 Mg Tabs (Loratadine) .... One Tablet By Mouth Once Daily 11)  Se-Tan Plus 162-115.2-1 Mg Caps (Fefum-Fepo-Fa-B Cmp-C-Zn-Mn-Cu) .... Take One Tablet By Mouth Daily 12)  Zoloft 50 Mg Tabs (Sertraline Hcl) .Marland Kitchen.. 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  Alcohol Use - yes occasionally Daily Caffeine Use Illicit Drug Use - no  Risk Factors: Smoking Status: never (06/21/2009)  Review of Systems      See HPI Psych:  Denies panic attacks, sense of great danger, suicidal thoughts/plans, thoughts of violence, unusual visions or sounds, and thoughts /plans of harming others. Endo:  Denies cold intolerance and heat intolerance.   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 Explained that LH and FSH may be skewed by OCPs but will check today.  Pt in agreement. Will start Zolof today, see below.   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  Discussed psychotherapy, she says they have a free counselor at work that she will talk to.  Follow up in one month.  Complete Medication List: 1)  Protonix 40 Mg Tbec (Pantoprazole sodium) .... Take one by mouth  daily 2)  Zovirax 5 % Crea (Acyclovir) .... Apply 5x per day for four days as needed. 3)  Necon 1/35 (28) 1-35 Mg-mcg Tabs (Norethindrone-eth estradiol) .... Take as directed 4)  Tandem Plus 162-115.2-1 Mg Caps (Fefum-fepo-fa-b cmp-c-zn-mn-cu) .... Take one capsule by mouth daily 5)  Protonix 40 Mg Tbec (Pantoprazole sodium) .... Take 1 tablet by mouth once a day 6)  Bcp  .... Use as directed 7)  Omnicef 300 Mg Caps  (Cefdinir) .... 2 tabs by mouth once a day 8)  Protonix 40 Mg Tbec (Pantoprazole sodium) .Marland Kitchen.. 1 each day 30 minutes before meal 9)  Loestrin 24 Fe 1-20 Mg-mcg Tabs (Norethin ace-eth estrad-fe) .... One tablet by mouth once daily 10)  Alavert 10 Mg Tabs (Loratadine) .... One tablet by mouth once daily 11)  Se-tan Plus 162-115.2-1 Mg Caps (Fefum-fepo-fa-b cmp-c-zn-mn-cu) .... Take one tablet by mouth daily 12)  Zoloft 50 Mg Tabs (Sertraline hcl) .Marland Kitchen.. 1 by mouth daily  Other Orders: Admin 1st Vaccine (36644) Flu Vaccine 82yrs + (03474)  Patient Instructions: 1)  Please make an appt to see me in one month. Prescriptions: ZOLOFT 50 MG TABS (SERTRALINE HCL) 1 by mouth daily  #30 x 6   Entered and Authorized by:   Ruthe Mannan MD   Signed by:   Ruthe Mannan MD on 01/15/2010   Method used:   [facility]        [facility] [address] [id number]       [address]       [place], [place]  [address]       Ph: [phone] or [phone]       Fax: [phone]   RxID:   [id number]    Orders Added: 1)  Admin 1st Vaccine [90471] 2)  Flu Vaccine 33yrs + [83151] 3)  Venipuncture [76160] 4)  TLB-TSH (Thyroid Stimulating Hormone) [84443-TSH] 5)  TLB-FSH (Follicle Stimulating Hormone) [83001-FSH] 6)  TLB-Luteinizing Hormone (LH) [83002-LH] 7)  Est. Patient Level IV [73710]     ACurrent Allergies (reviewed today): No known allergies  Flu Vaccine Consent Questions     Do you have a history of severe allergic reactions to this vaccine? no    Any prior history of allergic reactions to egg and/or gelatin? no    Do you have a sensitivity to the preservative Thimersol? no    Do you have a past history of Guillan-Barre Syndrome? no    Do you currently have an acute febrile illness? no    Have you ever had a severe reaction to latex? no    Vaccine information given and explained to patient? yes    Are you currently pregnant? no    Lot Number:[id number]   Exp Date:09/20/2010   Site  Given  Left Deltoid IM

## 2010-04-22 NOTE — Assessment & Plan Note (Signed)
Summary: 1 yr. f/u-med. refill/ch.   History of Present Illness Primary GI MD: Sheryn Bison MD FACP FAGA Chief Complaint: yearly f/u, Protonix refill. No problems at this time. History of Present Illness:   45 year old Caucasian female with chronic iron deficiency suspected from menorrhagia resolved daily and therapy. She denies lower GI complaints but does have a family history of colon polyps in her mother. She has chronic reflux symptoms managed by daily Protonix 40 mg, and denies current symptomatology or dysphagia. She does have a lot of abdominal gas and bloating and uses a large amount of p.o. fructose and sorbitol. She denies rectal bleeding, melena, alcohol, cigarette, or NSAID use. She has not had previous colonoscopy. Last endoscopy was completed in 1999.   GI Review of Systems      Denies abdominal pain, acid reflux, belching, bloating, chest pain, dysphagia with liquids, dysphagia with solids, heartburn, loss of appetite, nausea, vomiting, vomiting blood, weight loss, and  weight gain.        Denies anal fissure, black tarry stools, change in bowel habit, constipation, diarrhea, diverticulosis, fecal incontinence, heme positive stool, hemorrhoids, irritable bowel syndrome, jaundice, light color stool, liver problems, rectal bleeding, and  rectal pain. Preventive Screening-Counseling & Management  Alcohol-Tobacco     Smoking Status: never      Drug Use:  no.      Current Medications (verified): 1)  Protonix 40 Mg  Tbec (Pantoprazole Sodium) .Marland Kitchen.. 1 Each Day 30 Minutes Before Meal 2)  Loestrin 24 Fe 1-20 Mg-Mcg Tabs (Norethin Ace-Eth Estrad-Fe) .... One Tablet By Mouth Once Daily 3)  Alavert 10 Mg Tabs (Loratadine) .... One Tablet By Mouth Once Daily  Allergies (verified): No Known Drug Allergies  Past History:  Past medical, surgical, family and social histories (including risk factors) reviewed for relevance to current acute and chronic problems.  Past Medical  History: Anemia GERD Allergies  Past Surgical History: Reviewed history from 06/19/2009 and no changes required. 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 Family History of Prostate Cancer:Father Family History of Heart Disease: Mother, Father-MI Family History of Colon Polyps:Mother  Social History: Reviewed history and no changes required. Married Patient has never smoked.  Alcohol Use - yes occasionally Daily Caffeine Use Illicit Drug Use - no Smoking Status:  never Drug Use:  no  Review of Systems       The patient complains of vision changes.  The patient denies allergy/sinus, anemia, anxiety-new, arthritis/joint pain, back pain, blood in urine, breast changes/lumps, change in vision, confusion, cough, coughing up blood, depression-new, fainting, fatigue, fever, headaches-new, hearing problems, heart murmur, heart rhythm changes, itching, menstrual pain, muscle pains/cramps, night sweats, nosebleeds, pregnancy symptoms, shortness of breath, skin rash, sleeping problems, sore throat, swelling of feet/legs, swollen lymph glands, thirst - excessive , urination - excessive , urination changes/pain, urine leakage, and voice change.    Vital Signs:  Patient profile:   45 year old female Height:      64 inches Weight:      180.50 pounds BMI:     31.09 Pulse rate:   88 / minute Pulse rhythm:   regular BP sitting:   124 / 72  (right arm) Cuff size:   regular  Vitals Entered By: Christie Nottingham CMA [facility] Dull) (June 21, 2009 11:32 AM)  Physical Exam  General:  Well developed, well nourished, no acute distress.healthy appearing.   Head:  Normocephalic and atraumatic. Eyes:  PERRLA, no  icterus.exam deferred to patient's ophthalmologist.   Lungs:  Clear throughout to auscultation. Heart:  Regular rate and rhythm; no murmurs, rubs,  or bruits. Abdomen:  Soft, nontender and nondistended. No masses,  hepatosplenomegaly or hernias noted. Normal bowel sounds. Extremities:  No clubbing, cyanosis, edema or deformities noted. Neurologic:  Alert and  oriented x4;  grossly normal neurologically. Inguinal Nodes:  No significant inguinal adenopathy. Psych:  Alert and cooperative. Normal mood and affect.   Impression & Recommendations:  Problem # 1:  ESOPHAGEAL REFLUX (ICD-530.81) Assessment Improved Continue daily Protonix with followup endoscopy and followup labs. Orders: TLB-CBC Platelet - w/Differential (85025-CBCD) TLB-BMP (Basic Metabolic Panel-BMET) (80048-METABOL) TLB-Hepatic/Liver Function Pnl (80076-HEPATIC) TLB-TSH (Thyroid Stimulating Hormone) (84443-TSH) TLB-B12, Serum-Total ONLY (81191-Y78) TLB-Ferritin (82728-FER) TLB-Folic Acid (Folate) (82746-FOL) TLB-IBC Pnl (Iron/FE;Transferrin) (83550-IBC) Colon/Endo (Colon/Endo)  Problem # 2:  FAMILY HX OF COLONIC POLYPS (ICD-V18.51) Assessment: Unchanged  Colonoscopy scheduled at her convenience.  Orders: Colon/Endo (Colon/Endo)  Problem # 3:  ANEMIA-IRON DEFICIENCY (ICD-280.9) Assessment: Unchanged  Endoscopy, colonoscopy, and repeat labs scheduled.  Orders: Colon/Endo (Colon/Endo)  Patient Instructions: 1)  You are being scheduled for a colonoscopy. 2)  Please go to the basement for lab work. 3)  Please continue current medications.  4)  The medication list was reviewed and reconciled.  All changed / newly prescribed medications were explained.  A complete medication list was provided to the patient / caregiver. Prescriptions: MOVIPREP 100 GM  SOLR (PEG-KCL-NACL-NASULF-NA ASC-C) As per prep instructions.  #1 x 0   Entered by:   Ashok Cordia RN   Authorized by:   Mardella Layman MD [facility]   Signed by:   Ashok Cordia RN on 06/21/2009   Method used:   [facility]        [facility] [facility]       [address]       [place], [place]  [address]       Ph: [phone] or [phone]        Fax: [phone]   RxID:   [id number]

## 2010-04-22 NOTE — Progress Notes (Signed)
Summary: zoloft   Phone Note Call from Patient Call back at [phone]   Caller: Patient Call For: Dr. Dayton Martes  Summary of Call: Patient was in yesterday and was given rx for zoloft. She took it for the first time this morning. Patient says that after taking it she felt "really funky", her mouth is "dry as a bone", and feels very anxious. She is asking if it is normal to feel this way or if she should try something else. [facility]  Initial call taken by: Melody Comas,  January 16, 2010 11:45 AM  Follow-up for Phone Call        yes as we discussed, can have those side effects for the first week, usually resolves completely. Ruthe Mannan MD  January 16, 2010 11:48 AM  Patient advised as instructed via telephone.    Follow-up by: [name] CMA Duncan Dull),  January 16, 2010 11:50 AM

## 2010-04-22 NOTE — Assessment & Plan Note (Signed)
Summary: ears stopped up/nt   Vital Signs:  Patient profile:   45 year old female Height:      64 inches Weight:      180 pounds BMI:     31.01 Temp:     98.7 degrees F oral Pulse rate:   84 / minute Pulse rhythm:   regular BP sitting:   140 / 82  (left arm) Cuff size:   regular  Vitals Entered By: [name] CMA Duncan Dull) (October 04, 2009 12:20 PM) CC: right ear pain   History of Present Illness: 45 yo here for right ear pain and decreased hearing for 1 month. No runny nose, sore throat, sneezing, cough, fevers or other URI symptoms.   Current Medications (verified): 1)  Protonix 40 Mg  Tbec (Pantoprazole Sodium) .... Take One By Mouth Daily 2)  Zovirax 5 %  Crea (Acyclovir) .... Apply 5x Per Day For Four Days As Needed. 3)  Necon 1/35 (28) 1-35 Mg-Mcg  Tabs (Norethindrone-Eth Estradiol) .... Take As Directed 4)  Tandem Plus 162-115.2-1 Mg  Caps (Fefum-Fepo-Fa-B Cmp-C-Zn-Mn-Cu) .... Take One Capsule By Mouth Daily 5)  Protonix 40 Mg Tbec (Pantoprazole Sodium) .... Take 1 Tablet By Mouth Once A Day 6)  Bcp .... Use As Directed 7)  Omnicef 300 Mg Caps (Cefdinir) .... 2 Tabs By Mouth Once A Day 8)  Protonix 40 Mg  Tbec (Pantoprazole Sodium) .Marland Kitchen.. 1 Each Day 30 Minutes Before Meal 9)  Loestrin 24 Fe 1-20 Mg-Mcg Tabs (Norethin Ace-Eth Estrad-Fe) .... One Tablet By Mouth Once Daily 10)  Alavert 10 Mg Tabs (Loratadine) .... One Tablet By Mouth Once Daily  Allergies (verified): No Known Drug Allergies  Review of Systems      See HPI General:  Denies fever. ENT:  Complains of decreased hearing and earache; denies difficulty swallowing, ear discharge, postnasal drainage, ringing in ears, sinus pressure, and sore throat. Resp:  Denies cough.  Physical Exam  General:  Well developed, well nourished, no acute distress.healthy appearing.   Ears:  R cerumen impaction.   s/p cerumen removal, TM normal. Left ear normal. Mouth:  Oral mucosa and oropharynx without lesions or exudates.   Teeth in good repair.  Lungs:  Normal respiratory effort, chest expands symmetrically. Lungs are clear to auscultation, no crackles or wheezes. Heart:  Normal rate and regular rhythm. S1 and S2 normal without gallop, murmur, click, rub or other extra sounds. Psych:  Cognition and judgment appear intact. Alert and cooperative with normal attention span and concentration. No apparent delusions, illusions, hallucinations   Impression & Recommendations:  Problem # 1:  CERUMEN IMPACTION, RIGHT (ICD-380.4) Assessment New  Cerumen removed with curette and water irrigation. Hearing and pain immediately resolved once cerumen removed.  Orders: Cerumen Impaction Removal (16109)  Complete Medication List: 1)  Protonix 40 Mg Tbec (Pantoprazole sodium) .... Take one by mouth daily 2)  Zovirax 5 % Crea (Acyclovir) .... Apply 5x per day for four days as needed. 3)  Necon 1/35 (28) 1-35 Mg-mcg Tabs (Norethindrone-eth estradiol) .... Take as directed 4)  Tandem Plus 162-115.2-1 Mg Caps (Fefum-fepo-fa-b cmp-c-zn-mn-cu) .... Take one capsule by mouth daily 5)  Protonix 40 Mg Tbec (Pantoprazole sodium) .... Take 1 tablet by mouth once a day 6)  Bcp  .... Use as directed 7)  Omnicef 300 Mg Caps (Cefdinir) .... 2 tabs by mouth once a day 8)  Protonix 40 Mg Tbec (Pantoprazole sodium) .Marland Kitchen.. 1 each day 30 minutes before meal 9)  Loestrin 24 Fe  1-20 Mg-mcg Tabs (Norethin ace-eth estrad-fe) .... One tablet by mouth once daily 10)  Alavert 10 Mg Tabs (Loratadine) .... One tablet by mouth once daily   Current Allergies (reviewed today): No known allergies

## 2010-04-22 NOTE — Progress Notes (Signed)
Summary: Cannot afford to see a therapist  Phone Note Call from Patient Call back at [phone] cell   Caller: Patient Call For: Dr. Dayton Martes Summary of Call: Patient called and stated that Dr. Dayton Martes thought it may be a good idea for her to go see a therapist.  She says that she cannot afford to do this right now because her husband lost his job and found a new one but his pay is almost cut in half from what he was making.  She says that maybe she can see a therapist later on but right now is out of the question.  Please advise. Initial call taken by: [name] CMA Duncan Dull),  January 20, 2010 9:34 AM  Follow-up for Phone Call        Noted.  Thank you. Ruthe Mannan MD  January 20, 2010 9:48 AM  Patient advised as instructed via telephone.  She will call back when she can afford to see a therapist.  Follow-up by: [name] CMA Duncan Dull),  January 20, 2010 10:06 AM

## 2010-04-22 NOTE — Letter (Signed)
Summary: Out of Work  [employer] [employer] at [facility]  [address] [place], [place] [address]   Phone: [phone]  Fax: [phone]    January 17, 2010   Employee:  Hannah Rocha    To Whom It May Concern:   For Medical reasons, please excuse the above named employee from work for the following dates:  Start:   01/14/2010-01/16/2010  End:   May return to work on 01/17/2010, earlier if she feels up to it.  If you need additional information, please feel free to contact our office.         Sincerely,      [name] CMA [facility]) for Dr. Ruthe Mannan

## 2010-04-24 NOTE — Assessment & Plan Note (Signed)
Summary: EARS FEEL STOPPED UP/ LB   Vital Signs:  Patient profile:   45 year old female Height:      64 inches Weight:      172.25 pounds BMI:     29.67 Temp:     98.6 degrees F oral Pulse rate:   81 / minute Pulse rhythm:   regular BP sitting:   140 / 80  (right arm) Cuff size:   regular  Vitals Entered By: [name] CMA Duncan Dull) (April 16, 2010 12:23 PM) CC: ears stopped up   History of Present Illness: 45 yo with 2 weeks of progressive URI symptoms.  coughing  and sneezing.   Now both ear full, right ear hurt so much last night she could not sleep. +sinus pressure. Chills, no fever that she is aware of.  No sputum production or wheezing.  Current Medications (verified): 1)  Protonix 40 Mg Tbec (Pantoprazole Sodium) .... Take 1 Tablet By Mouth Once A Day 2)  Loestrin 24 Fe 1-20 Mg-Mcg Tabs (Norethin Ace-Eth Estrad-Fe) .... One Tablet By Mouth Once Daily 3)  Alavert 10 Mg Tabs (Loratadine) .... One Tablet By Mouth Once Daily 4)  Se-Tan Plus 162-115.2-1 Mg Caps (Fefum-Fepo-Fa-B Cmp-C-Zn-Mn-Cu) .... Take One Tablet By Mouth Daily 5)  Alprazolam 0.25 Mg Tabs (Alprazolam) .Marland Kitchen.. 1 Tab By Mouth Three Times A Day As Needed Anxiety. 6)  Augmentin 875-125 Mg Tabs (Amoxicillin-Pot Clavulanate) .Marland Kitchen.. 1 By Mouth 2 Times Daily X 10 Days  Allergies: 1)  ! 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  Alcohol Use - yes occasionally Daily Caffeine Use Illicit Drug Use - no  Risk Factors: Smoking Status: never (06/21/2009)  Review of Systems      See HPI General:   Complains of chills; denies fever. ENT:  Complains of earache; denies ear discharge. Resp:  Complains of cough; denies shortness of breath, sputum productive, and wheezing.  Physical Exam  General:  GEN: nad, alert and oriented HEENT: mucous membranes moist, TM w/o erythema, nasal epithelium injected, OP with cobblestoning NECK: supple w/o LA CV: rrr. PULM: ctab, no inc wob ABD: soft, +bs EXT: no edema     Impression & Recommendations:  Problem # 1:  URI (ICD-465.9) Assessment New Given duration and progression of symptoms, will treat for bacterial sinusitis. Supportive care as per pt instructions. Her updated medication list for this problem includes:    Alavert 10 Mg Tabs (Loratadine) ..... One tablet by mouth once daily  Complete Medication List: 1)  Protonix 40 Mg Tbec (Pantoprazole sodium) .... Take 1 tablet by mouth once a day 2)  Loestrin 24 Fe 1-20 Mg-mcg Tabs (Norethin ace-eth estrad-fe) .... One tablet by mouth once daily 3)  Alavert 10 Mg Tabs (Loratadine) .... One tablet by mouth once daily 4)  Se-tan Plus 162-115.2-1 Mg Caps (Fefum-fepo-fa-b cmp-c-zn-mn-cu) .... Take one tablet by mouth daily 5)  Alprazolam 0.25 Mg Tabs (Alprazolam) .Marland Kitchen.. 1 tab by mouth three times a day as needed anxiety. 6)  Augmentin 875-125 Mg Tabs (Amoxicillin-pot clavulanate) .Marland Kitchen.. 1 by mouth 2 times daily x 10 days  Patient Instructions: 1)  Take antibiotic as directed.  Drink lots of fluids.  Treat sympotmatically with Mucinex, nasal saline irrigation, and Tylenol/Ibuprofen. Also try claritin D or zyrtec D over the counter- two times a day as needed ( have to sign for them at pharmacy). You can use warm compresses.  Cough suppressant at night. Call if not improving as expected in 5-7 days.  Prescriptions: AUGMENTIN 875-125 MG TABS (AMOXICILLIN-POT CLAVULANATE) 1 by mouth 2 times daily x 10 days  #20 x 0   Entered and Authorized by:   Ruthe Mannan MD   Signed by:   Ruthe Mannan MD on 04/16/2010    Method used:   [facility]        [facility]       [address]       [place], [place]  [address]       Ph: [phone] or [phone]       Fax: [phone]   RxID:   [id number]    Orders Added: 1)  Est. Patient Level III [59563]    Current Allergies (reviewed today): ! ZOLOFT

## 2010-04-24 NOTE — Assessment & Plan Note (Signed)
Summary: CHEST CONGESTION,COUGH/CLE   Vital Signs:  Patient profile:   45 year old female Height:      64 inches Weight:      176.75 pounds BMI:     30.45 Temp:     98.8 degrees F oral Pulse rate:   88 / minute Pulse rhythm:   regular BP sitting:   152 / 86  (left arm) Cuff size:   regular  Vitals Entered By: Delilah Shan CMA Duncan Dull) (March 11, 2010 4:08 PM) CC: Chest congestion   History of Present Illness: Feeling better today than yesterday.  Had green sputum over last 2 weeks. Increased earlier in the week.  Had been coughing more and sneezing.  No FCNAV.  Occ R ear pain over last few days.  She can hear better from L ear.  H/o ETD.    Current Medications (verified): 1)  Zovirax 5 %  Crea (Acyclovir) .... Apply 5x Per Day For Four Days As Needed. 2)  Tandem Plus 162-115.2-1 Mg  Caps (Fefum-Fepo-Fa-B Cmp-C-Zn-Mn-Cu) .... Take One Capsule By Mouth Daily 3)  Protonix 40 Mg Tbec (Pantoprazole Sodium) .... Take 1 Tablet By Mouth Once A Day 4)  Loestrin 24 Fe 1-20 Mg-Mcg Tabs (Norethin Ace-Eth Estrad-Fe) .... One Tablet By Mouth Once Daily 5)  Alavert 10 Mg Tabs (Loratadine) .... One Tablet By Mouth Once Daily 6)  Se-Tan Plus 162-115.2-1 Mg Caps (Fefum-Fepo-Fa-B Cmp-C-Zn-Mn-Cu) .... Take One Tablet By Mouth Daily 7)  Alprazolam 0.25 Mg Tabs (Alprazolam) .Marland Kitchen.. 1 Tab By Mouth Three Times A Day As Needed Anxiety.  Allergies: 1)  ! Zoloft  Review of Systems       See HPI.  Otherwise negative.    Physical Exam  General:  GEN: nad, alert and oriented HEENT: mucous membranes moist, TM w/o erythema, nasal epithelium injected, OP with cobblestoning NECK: supple w/o LA CV: rrr. PULM: ctab, no inc wob ABD: soft, +bs EXT: no edema  Weber test eqal with air>bone conduction bilateral    Impression & Recommendations:  Problem # 1:  URI (ICD-465.9) Likely resolving uri, likely viral and no indication for antibiotics.  follow up as needed. supporitve tx o/w. She agrees.  follow up  if hearing changes are noted.   Her updated medication list for this problem includes:    Alavert 10 Mg Tabs (Loratadine) ..... One tablet by mouth once daily  Complete Medication List: 1)  Zovirax 5 % Crea (Acyclovir) .... Apply 5x per day for four days as needed. 2)  Tandem Plus 162-115.2-1 Mg Caps (Fefum-fepo-fa-b cmp-c-zn-mn-cu) .... Take one capsule by mouth daily 3)  Protonix 40 Mg Tbec (Pantoprazole sodium) .... Take 1 tablet by mouth once a day 4)  Loestrin 24 Fe 1-20 Mg-mcg Tabs (Norethin ace-eth estrad-fe) .... One tablet by mouth once daily 5)  Alavert 10 Mg Tabs (Loratadine) .... One tablet by mouth once daily 6)  Se-tan Plus 162-115.2-1 Mg Caps (Fefum-fepo-fa-b cmp-c-zn-mn-cu) .... Take one tablet by mouth daily 7)  Alprazolam 0.25 Mg Tabs (Alprazolam) .Marland Kitchen.. 1 tab by mouth three times a day as needed anxiety.  Patient Instructions: 1)  I would use the over the counter cough syrup.   2)  Get plenty of rest, drink lots of clear liquids, and use Tylenol or Ibuprofen for fever and comfort.  3)  This should continue to get better gradually.  Take care.     Orders Added: 1)  Est. Patient Level III [16109]    Current Allergies (reviewed today): ! ZOLOFT

## 2010-07-17 ENCOUNTER — Encounter: Payer: Self-pay | Admitting: Family Medicine

## 2010-07-18 ENCOUNTER — Ambulatory Visit (INDEPENDENT_AMBULATORY_CARE_PROVIDER_SITE_OTHER): Payer: BC Managed Care – PPO | Admitting: Family Medicine

## 2010-07-18 ENCOUNTER — Encounter: Payer: Self-pay | Admitting: Family Medicine

## 2010-07-18 DIAGNOSIS — L989 Disorder of the skin and subcutaneous tissue, unspecified: Secondary | ICD-10-CM

## 2010-07-18 NOTE — Patient Instructions (Signed)
Let us know if the spot on your leg gets bigger, redder, or more tender.  Take care.

## 2010-07-18 NOTE — Progress Notes (Signed)
"  Lump" on L anterior shin.  Present for ~43month.  No sig change in size.  Minimally ttp.  No trauma known.  Feels well o/w except for some congestion from allergies.  No other lumps/lesions noted by patient.  No FCNAV, no sweats.    Meds, vitals, and allergies reviewed.   ROS: See HPI.  Otherwise, noncontributory.  nad Skin exam unremarkable except for lesion on L anterior shin. ~1cm across.  Skin is minimally pinkish across the lesion, but not red.  I can feel a soft, minimally ttp lesion under the skin but mobile and not affixed to bone.  No ulceration or other changes.

## 2010-07-18 NOTE — Assessment & Plan Note (Signed)
No known trauma or bug bite.  It doesn't look infected or resemble a boil.  It doesn't feel like it involves deeper structures, ie muscle/tendon DDx: Dermatofibroma, lipoma, cyst, resolving bruise/scar tissue.  She'll observe and fu if she notes changes, inc in size or pain.

## 2010-07-29 ENCOUNTER — Other Ambulatory Visit: Payer: Self-pay | Admitting: Gastroenterology

## 2010-08-05 NOTE — Assessment & Plan Note (Signed)
[facility]                         GASTROENTEROLOGY OFFICE NOTE   Hannah Rocha, Hannah Rocha                      MRN:          [id number]  DATE:07/05/2007                            DOB:          January 02, 1966    Hannah Rocha is asymptomatic and is not having acid reflux symptoms on  Protonix 40 mg a day.  She was last seen in February 2008 and was  asymptomatic, but CBC showed mild anemia with a hemoglobin of 11.7 and a  5% iron saturation with a serum ferritin of 9.3 ng percent. She was on  iron replacement therapy for several months.   She is followed by Dr. Arvil Chaco in [facility] and continues to have  regular menstrual periods which generally last 2 days and are not  especially heavy.  She has had no melena, hematochezia, or lower GI  complaints.  She has no family history of colon cancer or polyps.  In  addition to Protonix, she is taking daily Ortho-Novum.   PAST MEDICAL HISTORY:  Otherwise entirely unremarkable except for  history of an ASD repair by Dr. Andrey Spearman in 1999.   PHYSICAL EXAMINATION:  GENERAL:  She is awake and alert, in no acute  distress.  VITAL SIGNS:  She weighs 182 pounds.  Blood pressure 114/76, pulse 72  and regular.  CHEST:  Clear.  CARDIAC:  She was in a regular rhythm without murmurs, gallops, or rubs.  ABDOMEN:  Exam was entirely benign without organomegaly, masses, or  tenderness.  Bowel sounds were normal.   ASSESSMENT:  1. Chronic gastroesophageal reflux disease, doing well on daily      Protonix therapy.  2. Mild iron-deficiency anemia.  3. Rule out osteoporosis.  4. Status post atrial septal defect repair.   RECOMMENDATIONS:  1. Repeat CBC and anemia profile.  2. Continue reflux regimen with daily Protonix.  3. Check celiac panel and multi-chemistry profile.  4. Continue GYN followup with Dr. Arvil Chaco.     Vania Rea. Jarold Motto, MD, Caleen Essex, FAGA  Electronically Signed   DRP/MedQ  DD: 07/05/2007  DT:  07/05/2007  Job #: [id number]   cc:   Salvatore Decent. Dorris Fetch, M.D.  Bonney [facility] Luberta Mutter C. Eden Emms, MD, [facility]

## 2010-11-25 ENCOUNTER — Encounter: Payer: Self-pay | Admitting: Family Medicine

## 2010-11-25 ENCOUNTER — Ambulatory Visit (INDEPENDENT_AMBULATORY_CARE_PROVIDER_SITE_OTHER): Payer: BC Managed Care – PPO | Admitting: Family Medicine

## 2010-11-25 DIAGNOSIS — H6590 Unspecified nonsuppurative otitis media, unspecified ear: Secondary | ICD-10-CM

## 2010-11-25 DIAGNOSIS — L989 Disorder of the skin and subcutaneous tissue, unspecified: Secondary | ICD-10-CM

## 2010-11-25 MED ORDER — FLUTICASONE PROPIONATE 50 MCG/ACT NA SUSP: 2.0000 | Freq: Every day | NASAL | Status: DC

## 2010-11-25 NOTE — Progress Notes (Signed)
New rash on L shin L biceps.  Present a few days.  Not itchy.  Flat w/o blister.  No others with similar.  No known trigger.  No help with topical cortisone.    R ear- dec in hearing noted. Gradual change.  No FCNAV.   Meds, vitals, and allergies reviewed.   ROS: See HPI.  Otherwise, noncontributory.  nad ncat Tm w/o erythema but R SOM noted Rinne and weber wnl Hearing improved after insuflation of R TM by pt.  Op wnl Neck supple ~1cm flaky patch on L biceps with central clearing.  Larger lesion on L anterior shin.  No fluctuant mass

## 2010-11-25 NOTE — Patient Instructions (Signed)
I would use the flonase 2 sprays in each nostril daily.  That should help with your ear symptoms.  I would use over the counter cream (clotrimazole, ketoconazole, or terbenafine) for the rash.  Take care.

## 2010-11-26 ENCOUNTER — Encounter: Payer: Self-pay | Admitting: Family Medicine

## 2010-11-26 DIAGNOSIS — H6590 Unspecified nonsuppurative otitis media, unspecified ear: Secondary | ICD-10-CM | POA: Insufficient documentation

## 2010-11-26 NOTE — Assessment & Plan Note (Signed)
Improved after inflation.  Use alavert and add on flonase, f/u prn.  She agrees.  Anatomy d/w pt.

## 2010-11-26 NOTE — Assessment & Plan Note (Signed)
Likely superficial fungal infection and use OTC tx.  She agrees.

## 2010-12-21 ENCOUNTER — Other Ambulatory Visit: Payer: Self-pay | Admitting: Gastroenterology

## 2011-01-06 NOTE — Telephone Encounter (Signed)
Patient will need an office visit for further refills  

## 2011-01-11 ENCOUNTER — Ambulatory Visit (INDEPENDENT_AMBULATORY_CARE_PROVIDER_SITE_OTHER): Payer: BC Managed Care – PPO

## 2011-01-11 ENCOUNTER — Inpatient Hospital Stay (INDEPENDENT_AMBULATORY_CARE_PROVIDER_SITE_OTHER)
Admission: RE | Admit: 2011-01-11 | Discharge: 2011-01-11 | Disposition: A | Discharge status: Home / Self Care | Payer: BC Managed Care – PPO | Source: Ambulatory Visit | Attending: Family Medicine | Admitting: Family Medicine

## 2011-01-11 DIAGNOSIS — J189 Pneumonia, unspecified organism: Secondary | ICD-10-CM

## 2011-01-12 ENCOUNTER — Telehealth: Payer: Self-pay | Admitting: *Deleted

## 2011-01-12 NOTE — Telephone Encounter (Signed)
[facility] Triage Call Report Triage Record Num: [id number] Operator: Chevis Pretty Patient Name: Hannah Rocha Call Date & Time: 01/11/2011 7:42:48AM Patient Phone: [phone] PCP: Ruthe Mannan Patient Gender: Female PCP Fax : [phone] Patient DOB: [birth year] [facility] Reason for Call: Patient calling about cough. Onset 01/07/11 but has progressed over the course of the week. States having some wheezing. States OTC medications are not helpful. Temp not taken, but states she has chills. States her back and chest ache from the cough. Had ASD repair 1999. Denies shortness of breath. Mild wheeze noted. Per protocol, advised being seen within 4 hours; referred to [facility] UC. States will go to UC at [facility]. Protocol(s) Used: Cough - Adult Recommended Outcome per Protocol: See Provider within 4 hours Reason for Outcome: New or worsening cough AND known cardiac or respiratory condition Care Advice: ~ Another adult should drive. Limit or avoid exposure to irritants and allergens (e.g. air pollution, smoke/smoking, chemicals, dust, pollen, pet dander, etc.) ~ ~ Continue all prescription medication as recommended until evaluated by provider. ~ Call provider if symptoms worsen or new symptoms develop. If home oxygen has been prescribed, apply it at the recommended flow rate; DO NOT increase the flow rate before consulting provider. ~ If patient has had similar symptoms in past, relieved by provider's recommendations, follow those recommendations now. ~ ~ Avoid any activity that produces symptoms until evaluated by provider. Call EMS 911 if sudden worsening of breathing problem, dusky or blue/gray color of lips, fingernail beds or skin; chest pain, weakness, or confusion occurs. ~ Go to ED IMMEDIATELY if developing increased shortness of breath, continuous cough, worsening fatigue, or unable to perform ADLs. ~ ~ SYMPTOM / CONDITION MANAGEMENT ~ IMMEDIATE  ACTION ~ CAUTIONS ~ List, or take, all current prescription(s), nonprescription or alternative medication(s) to provider for evaluation. Most adults need to drink 6-10 eight-ounce glasses (1.2-2.0 liters) of fluids per day unless previously told to limit fluid intake for other medical reasons. Limit fluids that contain caffeine, sugar or alcohol. Urine will be a very light yellow color when you drink enough fluids. ~ 01/11/2011 7:52:20AM Page 1 of 1 [email or web address]_[email or web address]

## 2011-01-13 ENCOUNTER — Encounter: Payer: Self-pay | Admitting: Family Medicine

## 2011-01-13 ENCOUNTER — Telehealth: Payer: Self-pay | Admitting: *Deleted

## 2011-01-13 ENCOUNTER — Other Ambulatory Visit: Payer: Self-pay | Admitting: Family Medicine

## 2011-01-13 ENCOUNTER — Ambulatory Visit (INDEPENDENT_AMBULATORY_CARE_PROVIDER_SITE_OTHER)
Admission: RE | Admit: 2011-01-13 | Discharge: 2011-01-13 | Disposition: A | Discharge status: Home / Self Care | Payer: BC Managed Care – PPO | Source: Ambulatory Visit | Attending: Family Medicine | Admitting: Family Medicine

## 2011-01-13 ENCOUNTER — Ambulatory Visit (INDEPENDENT_AMBULATORY_CARE_PROVIDER_SITE_OTHER): Payer: BC Managed Care – PPO | Admitting: Family Medicine

## 2011-01-13 VITALS — BP 122/70 | HR 96 | Temp 98.2°F | Ht 64.0 in | Wt 181.5 lb

## 2011-01-13 DIAGNOSIS — J189 Pneumonia, unspecified organism: Secondary | ICD-10-CM

## 2011-01-13 NOTE — Telephone Encounter (Signed)
Ok to write this note and use my stamp to sign. Thank you.

## 2011-01-13 NOTE — Telephone Encounter (Signed)
Pt was seen in office yestrday and diagnosed with and treated for CAP.

## 2011-01-13 NOTE — Telephone Encounter (Signed)
Patient is requesting a note to be out of work all week and return on Monday 01/19/2011.  Please advise.

## 2011-01-13 NOTE — Progress Notes (Signed)
  Subjective:    Patient ID: Hannah Rocha, female    DOB: 1965/09/22, 45 y.o.   MRN: [id number]  HPI  45 yo with h/o septal defect repair here for UC follow up. Note reviewed.  Was seen at [facility] on 01/11/2011 for 3 day h/o productive cough, wheezing and SOB.  CXR:  Patchy densities and possible nodular densities L mid lung.  Radiology recommended short term follow up to ensure resolution.  If not resolved, CT scan recommended.  Was placed Zpack, promethazine-codeine, and Ventolin inhaler.  Advised to follow up here for follow up CXR today. Feels better, has not had a fever in 3 days.  Still fatigued and easily SOB with exertion.  Review of Systems See HPI    Objective:   Physical Exam BP 122/70  Pulse 96  Temp(Src) 98.2 F (36.8 C) (Oral)  Ht 5\' 4"  (1.626 m)  Wt 181 lb 8 oz (82.328 kg)  BMI 31.15 kg/m2  LMP 01/12/2011  Gen:  Alert, NAD HEENT: neck supple, no adenopathy or pharyngeal erythema. Resp:  No increased WOB, Pos rales LLL CVS:  RRR Skin:  Good color and tone Ext:  No edema.     Assessment & Plan:   1. Community acquired pneumonia    New. Still with rales throughout LLL. Will repeat CXR as patient was instructed from UC but will likely need a repeat xray in several weeks for true comparison. If symptoms persist, consider adding Avelox (currently on day 3/5 of Zpack).

## 2011-01-13 NOTE — Patient Instructions (Signed)
Good to see you. We will call you with your xray results tonight or tomorrow. Please keep [place] posted with your symptoms.

## 2011-01-14 NOTE — Telephone Encounter (Signed)
Letter complete, patient advised via telephone letter ready for pick up will be left at front desk.

## 2011-01-17 ENCOUNTER — Other Ambulatory Visit: Payer: Self-pay | Admitting: Gastroenterology

## 2011-01-23 ENCOUNTER — Ambulatory Visit (INDEPENDENT_AMBULATORY_CARE_PROVIDER_SITE_OTHER): Payer: BC Managed Care – PPO | Admitting: Gastroenterology

## 2011-01-23 ENCOUNTER — Ambulatory Visit: Payer: BC Managed Care – PPO

## 2011-01-23 ENCOUNTER — Encounter: Payer: Self-pay | Admitting: Gastroenterology

## 2011-01-23 VITALS — BP 116/70 | HR 80 | Ht 64.0 in | Wt 180.0 lb

## 2011-01-23 DIAGNOSIS — K219 Gastro-esophageal reflux disease without esophagitis: Secondary | ICD-10-CM

## 2011-01-23 DIAGNOSIS — N920 Excessive and frequent menstruation with regular cycle: Secondary | ICD-10-CM

## 2011-01-23 DIAGNOSIS — D649 Anemia, unspecified: Secondary | ICD-10-CM | POA: Insufficient documentation

## 2011-01-23 LAB — CBC WITH DIFFERENTIAL/PLATELET
Basophils Absolute: 0 10*3/uL (ref 0.0–0.1)
Eosinophils Relative: 0.8 % (ref 0.0–5.0)
HCT: 39.5 % (ref 36.0–46.0)
Lymphs Abs: 2.4 10*3/uL (ref 0.7–4.0)
MCV: 93.9 fl (ref 78.0–100.0)
Monocytes Absolute: 0.8 10*3/uL (ref 0.1–1.0)
Neutro Abs: 6.4 10*3/uL (ref 1.4–7.7)
Platelets: 262 10*3/uL (ref 150.0–400.0)
RDW: 12.5 % (ref 11.5–14.6)

## 2011-01-23 LAB — IBC PANEL
Iron: 128 ug/dL (ref 42–145)
Saturation Ratios: 34.6 % (ref 20.0–50.0)
Transferrin: 264 mg/dL (ref 212.0–360.0)

## 2011-01-23 MED ORDER — PANTOPRAZOLE SODIUM 40 MG PO TBEC: 40.0000 mg | DELAYED_RELEASE_TABLET | Freq: Every day | ORAL | Status: DC

## 2011-01-23 NOTE — Progress Notes (Signed)
This is a pleasant 45 year old Caucasian female who has a history of chronic iron deficiency related to menorrhagia. I have had her on iron therapy for 6 months, she denies any current gastrointestinal issues. She does have chronic GERD and is on Protonix 40 mg a day. She has had negative screening colonoscopy. She specifically denies melena, hematochezia, reflux symptoms, dysphagia, or any hepatobiliary complaints her general medical symptomatology. He feels that she is undergoing menopause with decreasing intervals of menstrual cycles. She is on birth control pills.  Current Medications, Allergies, Past Medical History, Past Surgical History, Family History and Social History were reviewed in [employer] record.  Pertinent Review of Systems Negative   Physical Exam: Awake and alert no acute distress. Her abdomen shows no organomegaly, masses or tenderness. Bowel sounds are normal. Mental status is normal and peripheral extremities are unremarkable.    Assessment and Plan: I GERD doing well daily Protonix therapy. Reflux regime reviewed with the patient. We will repeat her CBC and iron studies and advise her as to continued Tandem prescription. She is to continue other medications as per primary care. Encounter Diagnosis  Name Primary?  Marland Kitchen Anemia Yes

## 2011-01-23 NOTE — Patient Instructions (Signed)
We will renew your Protonix prescription You need to go to the basement for labs today

## 2011-01-30 ENCOUNTER — Ambulatory Visit (INDEPENDENT_AMBULATORY_CARE_PROVIDER_SITE_OTHER)
Admission: RE | Admit: 2011-01-30 | Discharge: 2011-01-30 | Disposition: A | Discharge status: Home / Self Care | Payer: BC Managed Care – PPO | Source: Ambulatory Visit | Attending: Family Medicine | Admitting: Family Medicine

## 2011-01-30 DIAGNOSIS — J189 Pneumonia, unspecified organism: Secondary | ICD-10-CM

## 2011-02-11 ENCOUNTER — Ambulatory Visit: Payer: BC Managed Care – PPO

## 2011-02-16 ENCOUNTER — Ambulatory Visit (INDEPENDENT_AMBULATORY_CARE_PROVIDER_SITE_OTHER): Payer: BC Managed Care – PPO | Admitting: Family Medicine

## 2011-02-16 ENCOUNTER — Encounter: Payer: Self-pay | Admitting: Family Medicine

## 2011-02-16 DIAGNOSIS — H6590 Unspecified nonsuppurative otitis media, unspecified ear: Secondary | ICD-10-CM

## 2011-02-16 MED ORDER — AMOXICILLIN 875 MG PO TABS: 875.0000 mg | ORAL_TABLET | Freq: Two times a day (BID) | ORAL | Status: AC

## 2011-02-16 NOTE — Patient Instructions (Addendum)
Get some nasal saline and use it several times a day.  I would hold the antibiotics and only start them if the facial pain continues to the end of the week.  Continue using the flonase.  Take care.  Use back up birth control on the antibiotics.

## 2011-02-16 NOTE — Progress Notes (Signed)
Friday she had some ear congestion.  Now with more pain across her face, maxillary more than frontal.  Nose feels dry.  Prev with PNA per patient report, but she had gotten over that.  No fevers.  No cough.  No ST.  Voice is at baseline.    Meds, vitals, and allergies reviewed.   ROS: See HPI.  Otherwise, noncontributory.  GEN: nad, alert and oriented, nontoxic HEENT: mucous membranes moist, tm w/o erythema but B SOM noted, nasal exam w/o erythema, clear discharge noted,  OP with cobblestoning, max sinuses ttp NECK: supple w/o LA CV: rrr.   PULM: ctab, no inc wob EXT: no edema SKIN: no acute rash

## 2011-02-16 NOTE — Assessment & Plan Note (Signed)
With URI.  Likely viral and nontoxic, I would hold abx and use nasal saline and flonase.  If sinus pain continues, then start abx.  Fu prn. She agrees.

## 2011-03-24 HISTORY — PX: BREAST CYST ASPIRATION: SHX578

## 2011-04-03 ENCOUNTER — Encounter: Payer: Self-pay | Admitting: Family Medicine

## 2011-04-03 ENCOUNTER — Ambulatory Visit (INDEPENDENT_AMBULATORY_CARE_PROVIDER_SITE_OTHER): Payer: BC Managed Care – PPO | Admitting: Family Medicine

## 2011-04-03 VITALS — BP 132/72 | HR 87 | Temp 98.8°F | Wt 183.8 lb

## 2011-04-03 DIAGNOSIS — J029 Acute pharyngitis, unspecified: Secondary | ICD-10-CM

## 2011-04-03 DIAGNOSIS — J069 Acute upper respiratory infection, unspecified: Secondary | ICD-10-CM

## 2011-04-03 NOTE — Progress Notes (Signed)
End of 11/12, had SOM that got better with flonase and time, didn't need amoxil filled.   duration of symptoms: 5 days Rhinorrhea: no congestion:yes ear pain:yes sore throat:yes Cough: minimal  Myalgias: yes (she getting ready to start menses and that may be the cause of the aches, as that is common for her) other concerns: sinus pain and pressure.  Gargling with salt water, with some relief.   No fevers  ROS: See HPI.  Otherwise negative.    Meds, vitals, and allergies reviewed.   GEN: nad, alert and oriented HEENT: mucous membranes moist, TM w/o erythema, nasal epithelium injected, OP with cobblestoning and irritation on the OP (c/w a viral infection) NECK: supple w/o LA CV: rrr. Murmur noted PULM: ctab, no inc wob ABD: soft, +bs EXT: no edema  RST neg.

## 2011-04-03 NOTE — Patient Instructions (Addendum)
When you get better, please call back about getting a pneumonia shot.   Drink plenty of fluids, take tylenol as needed, and gargle with warm salt water for your throat.  This should gradually improve.  Take care.  Let us know if you have other concerns.  Use the flonase for the nasal congestion and ear pressure.

## 2011-04-05 DIAGNOSIS — J069 Acute upper respiratory infection, unspecified: Secondary | ICD-10-CM | POA: Insufficient documentation

## 2011-04-05 NOTE — Assessment & Plan Note (Signed)
Likely viral given the exam, sinuses not ttp, nontoxic, supportive tx and f/u prn. D/w pt.

## 2011-04-16 ENCOUNTER — Telehealth: Payer: Self-pay | Admitting: Family Medicine

## 2011-04-16 NOTE — Telephone Encounter (Signed)
Patient has an appt on 04/17/2011 with Dr. Dayton Martes.

## 2011-04-16 NOTE — Telephone Encounter (Signed)
Triage Record Num: [id number] Operator: Durward Mallard DiMatteis Patient Name: Hannah Rocha Call Date & Time: 04/16/2011 12:01:45PM Patient Phone: [phone] PCP: Ruthe Mannan Patient Gender: Female PCP Fax : [phone] Patient DOB: 23-Nov-1965 [facility] Name[facility] Reason for Call: Caller: Ambur/Patient; PCP: Ruthe Mannan Nestor Ramp); CB#: [id number]; Call regarding Ear Feels Stopped Up and Dizzy; rt ear is hurting and dizzy when she gets up; sx started today; no fever; Triaged per Ear: Symptoms Guideline; See in 24 hr d/t constant earache and feelingfullness that is interfering with daily activites; appt made for tomorrow, 04/17/11 at 1:00pm with Dr Dayton Martes Protocol(s) Used: Ear: Symptoms Recommended Outcome per Protocol: See Provider within 24 hours Reason for Outcome: Constant or intermittent dull earache, throbbing in ear(s) or feeling of fullness; may interfere with sleep or ability to carry out normal activities Care Advice: A warm washcloth or heating pad set on low to the affected ear may help relieve the discomfort. May apply for 15 to 20 minutes, 3 to 4 times a day. ~ Resting or sleeping with head elevated, such as semi-reclining in a recliner, may help reduce inner ear pressure and discomfort. ~ Keep ear canal as dry as possible. Take a bath instead of showering. Try to keep water out of ear when washing hair by placing cotton balls in ear opening. Avoid swimming or water sports until [place] by provider. ~ ~ SYMPTOM / CONDITION MANAGEMENT Analgesic/Antipyretic Advice - Acetaminophen: Consider acetaminophen as directed on label or by pharmacist/provider for pain or fever PRECAUTIONS: - Use if there is no history of liver disease, alcoholism, or intake of three or more alcohol drinks per day - Only if approved by provider during pregnancy or when breastfeeding - During pregnancy, acetaminophen should not be taken more than 3 consecutive days without telling  provider - Do not exceed recommended dose or frequency ~ 04/16/2011 12:12:07PM Page 1 of 1 [email or web address]_[email or web address]_[email or web address]

## 2011-04-17 ENCOUNTER — Ambulatory Visit: Payer: BC Managed Care – PPO | Admitting: Family Medicine

## 2011-04-20 ENCOUNTER — Encounter: Payer: Self-pay | Admitting: Family Medicine

## 2011-04-20 ENCOUNTER — Ambulatory Visit (INDEPENDENT_AMBULATORY_CARE_PROVIDER_SITE_OTHER): Payer: BC Managed Care – PPO | Admitting: Family Medicine

## 2011-04-20 VITALS — BP 118/80 | HR 76 | Temp 98.3°F | Wt 181.5 lb

## 2011-04-20 DIAGNOSIS — H9201 Otalgia, right ear: Secondary | ICD-10-CM | POA: Insufficient documentation

## 2011-04-20 DIAGNOSIS — H9209 Otalgia, unspecified ear: Secondary | ICD-10-CM

## 2011-04-20 MED ORDER — AMOXICILLIN-POT CLAVULANATE 875-125 MG PO TABS: 1.0000 | ORAL_TABLET | Freq: Two times a day (BID) | ORAL | Status: AC

## 2011-04-20 NOTE — Patient Instructions (Signed)
Good to see you. Continue Ibuprofen and take Augmentin as directed.

## 2011-04-20 NOTE — Progress Notes (Signed)
SUBJECTIVE: Hannah Rocha is a 46 y.o. female with 1 week(s) history of pain  at right ear, and coryza and congestion. Temperature not measured at home.   Patient Active Problem List  Diagnoses  . HERPES LABIALIS  . ANEMIA-IRON DEFICIENCY  . ADJUSTMENT DISORDER WITH MIXED FEATURES  . ACUTE ATOPIC CONJUNCTIVITIS  . Esophageal Reflux  . HIATAL HERNIA  . IBS  . MENOPAUSAL SYNDROME  . DERMATITIS, CONTACT, DUE TO PLANTS  . ATRIAL SEPTAL DEFECT, SECUNDUM TYPE  . 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  . IRON DEFICIENCY ANEMIA, HX OF  . CARDIAC MURMUR, HX OF  . Skin lesion  . Community acquired pneumonia  . Anemia  . GERD (gastroesophageal reflux disease)  . Menorrhagia  . URI (upper respiratory infection)  . Right ear pain   Past Medical History  Diagnosis Date  . Anemia   . GERD (gastroesophageal reflux disease)   . Allergy    Past Surgical History  Procedure Date  . Asd repair   . Mandible fracture surgery   . Cleft lip repair    History  Substance Use Topics  . Smoking status: Never Smoker   . Smokeless tobacco: Never Used  . Alcohol Use: Yes     occ   Family History  Problem Relation Age of Onset  . Heart disease Mother   . Colon polyps Mother   . Prostate cancer Father   . Heart disease Father    Allergies  Allergen Reactions  . Sertraline Hcl     REACTION: vomiting, severe anxiety   Current Outpatient Prescriptions on File Prior to Visit  Medication Sig Dispense Refill  . ALPRAZolam (XANAX) 0.25 MG tablet Take 0.25 mg by mouth 3 (three) times daily as needed.        . FeFum-FePo-FA-B Cmp-C-Zn-Mn-Cu (TANDEM PLUS) 162-115.2-1 MG CAPS TAKE 1 CAPSULE EVERY DAY BY MOUTH  30 each  2  . fluticasone (FLONASE) 50 MCG/ACT nasal spray Place 2 sprays into the nose daily.  16 g  2  . loratadine (ALAVERT) 10 MG tablet Take 10 mg by mouth daily.        . Norethin Ace-Eth Estrad-FE (LOESTRIN 24 FE) 1-20 MG-MCG(24) TABS Take 1 tablet by mouth daily.        .  pantoprazole (PROTONIX) 40 MG tablet Take 1 tablet (40 mg total) by mouth daily.  30 tablet  5   The PMH, PSH, Social History, Family History, Medications, and allergies have been reviewed in [facility], and have been updated if relevant.  OBJECTIVE: BP 118/80  Pulse 76  Temp(Src) 98.3 F (36.8 C) (Oral)  Wt 181 lb 8 oz (82.328 kg)  LMP 02/23/2011 General appearance: alert, well appearing, and in no distress.   Ears: left ear normal, right TM red, dull, bulging Nose: mucosal congestion Oropharynx: mucous membranes moist, pharynx normal without lesions Neck: supple, no significant adenopathy Lungs: clear to auscultation, no wheezes, rales or rhonchi, symmetric air entry  ASSESSMENT: Otitis Media  PLAN: 1) See orders for this visit as documented in the electronic medical record. 2) Symptomatic therapy suggested: use acetaminophen prn.  3) Call or return to clinic prn if these symptoms worsen or fail to improve as anticipated.

## 2011-05-19 ENCOUNTER — Ambulatory Visit: Payer: Self-pay | Admitting: Unknown Physician Specialty

## 2011-05-26 ENCOUNTER — Ambulatory Visit: Payer: Self-pay | Admitting: Unknown Physician Specialty

## 2011-05-29 ENCOUNTER — Ambulatory Visit (INDEPENDENT_AMBULATORY_CARE_PROVIDER_SITE_OTHER): Payer: BC Managed Care – PPO | Admitting: Family Medicine

## 2011-05-29 ENCOUNTER — Encounter: Payer: Self-pay | Admitting: Family Medicine

## 2011-05-29 VITALS — BP 150/80 | HR 96 | Temp 98.5°F | Wt 181.0 lb

## 2011-05-29 DIAGNOSIS — J069 Acute upper respiratory infection, unspecified: Secondary | ICD-10-CM

## 2011-05-29 MED ORDER — FLUTICASONE PROPIONATE 50 MCG/ACT NA SUSP: 2.0000 | Freq: Every day | NASAL | Status: DC

## 2011-05-29 NOTE — Progress Notes (Signed)
SUBJECTIVE:  Hannah Rocha is a 46 y.o. female who complains of coryza, congestion and dry cough for 4 days. She denies a history of anorexia, chest pain and chills and admits to a history of asthma.   Had tooth extraction this week- taking amoxicillin 500 mg three times daily.   Patient Active Problem List  Diagnoses  . HERPES LABIALIS  . ANEMIA-IRON DEFICIENCY  . ADJUSTMENT DISORDER WITH MIXED FEATURES  . ACUTE ATOPIC CONJUNCTIVITIS  . Esophageal Reflux  . HIATAL HERNIA  . IBS  . MENOPAUSAL SYNDROME  . DERMATITIS, CONTACT, DUE TO PLANTS  . ATRIAL SEPTAL DEFECT, SECUNDUM TYPE  . 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  . IRON DEFICIENCY ANEMIA, HX OF  . CARDIAC MURMUR, HX OF  . Skin lesion  . Community acquired pneumonia  . Anemia  . GERD (gastroesophageal reflux disease)  . Menorrhagia  . URI (upper respiratory infection)  . Right ear pain   Past Medical History  Diagnosis Date  . Anemia   . GERD (gastroesophageal reflux disease)   . Allergy    Past Surgical History  Procedure Date  . Asd repair   . Mandible fracture surgery   . Cleft lip repair    History  Substance Use Topics  . Smoking status: Never Smoker   . Smokeless tobacco: Never Used  . Alcohol Use: Yes     occ   Family History  Problem Relation Age of Onset  . Heart disease Mother   . Colon polyps Mother   . Prostate cancer Father   . Heart disease Father    Allergies  Allergen Reactions  . Sertraline Hcl     REACTION: vomiting, severe anxiety   Current Outpatient Prescriptions on File Prior to Visit  Medication Sig Dispense Refill  . ALPRAZolam (XANAX) 0.25 MG tablet Take 0.25 mg by mouth 3 (three) times daily as needed.        . FeFum-FePo-FA-B Cmp-C-Zn-Mn-Cu (TANDEM PLUS) 162-115.2-1 MG CAPS TAKE 1 CAPSULE EVERY DAY BY MOUTH  30 each  2  . loratadine (ALAVERT) 10 MG tablet Take 10 mg by mouth daily.        . Norethin Ace-Eth Estrad-FE (LOESTRIN 24 FE) 1-20 MG-MCG(24) TABS Take 1  tablet by mouth daily.        . pantoprazole (PROTONIX) 40 MG tablet Take 1 tablet (40 mg total) by mouth daily.  30 tablet  5   The PMH, PSH, Social History, Family History, Medications, and allergies have been reviewed in [facility], and have been updated if relevant.  OBJECTIVE: \BP 150/80  Pulse 96  Temp(Src) 98.5 F (36.9 C) (Oral)  Wt 181 lb (82.101 kg)  She appears well, vital signs are as noted. Ears normal.  Throat and pharynx normal.  Neck supple. No adenopathy in the neck. Nose is congested. Sinuses non tender. The chest is clear, without wheezes or rales.  ASSESSMENT:  viral upper respiratory illness  PLAN: Symptomatic therapy suggested: push fluids, rest and return office visit prn if symptoms persist or worsen. Finish abx prescribed by dentist. Call or return to clinic prn if these symptoms worsen or fail to improve as anticipated.

## 2011-07-26 ENCOUNTER — Other Ambulatory Visit: Payer: Self-pay | Admitting: Gastroenterology

## 2012-01-14 ENCOUNTER — Other Ambulatory Visit: Payer: Self-pay | Admitting: Gastroenterology

## 2012-01-29 ENCOUNTER — Other Ambulatory Visit: Payer: Self-pay | Admitting: Family Medicine

## 2012-02-04 ENCOUNTER — Encounter: Payer: Self-pay | Admitting: Gastroenterology

## 2012-02-08 ENCOUNTER — Encounter: Payer: Self-pay | Admitting: *Deleted

## 2012-02-12 ENCOUNTER — Encounter: Payer: Self-pay | Admitting: Gastroenterology

## 2012-02-12 ENCOUNTER — Ambulatory Visit (INDEPENDENT_AMBULATORY_CARE_PROVIDER_SITE_OTHER): Payer: BC Managed Care – PPO | Admitting: Gastroenterology

## 2012-02-12 ENCOUNTER — Other Ambulatory Visit (INDEPENDENT_AMBULATORY_CARE_PROVIDER_SITE_OTHER): Payer: BC Managed Care – PPO

## 2012-02-12 VITALS — BP 140/80 | HR 84 | Ht 64.0 in | Wt 182.8 lb

## 2012-02-12 DIAGNOSIS — D649 Anemia, unspecified: Secondary | ICD-10-CM

## 2012-02-12 DIAGNOSIS — K219 Gastro-esophageal reflux disease without esophagitis: Secondary | ICD-10-CM

## 2012-02-12 LAB — CBC WITH DIFFERENTIAL/PLATELET
Basophils Relative: 0.4 % (ref 0.0–3.0)
Eosinophils Absolute: 0 10*3/uL (ref 0.0–0.7)
Eosinophils Relative: 0.9 % (ref 0.0–5.0)
Hemoglobin: 13.3 g/dL (ref 12.0–15.0)
Lymphocytes Relative: 31.2 % (ref 12.0–46.0)
MCHC: 33.6 g/dL (ref 30.0–36.0)
MCV: 92.3 fl (ref 78.0–100.0)
Monocytes Absolute: 0.6 10*3/uL (ref 0.1–1.0)
Neutro Abs: 2.2 10*3/uL (ref 1.4–7.7)
Neutrophils Relative %: 53.6 % (ref 43.0–77.0)
RBC: 4.3 Mil/uL (ref 3.87–5.11)
WBC: 4.2 10*3/uL — ABNORMAL LOW (ref 4.5–10.5)

## 2012-02-12 LAB — FOLATE: Folate: >24.8 ng/mL (ref >5.9)

## 2012-02-12 LAB — IBC PANEL
Iron: 76 ug/dL (ref 42–145)
Saturation Ratios: 22.3 % (ref 20.0–50.0)

## 2012-02-12 MED ORDER — PANTOPRAZOLE SODIUM 40 MG PO TBEC: 40.0000 mg | DELAYED_RELEASE_TABLET | Freq: Every day | ORAL | Status: DC

## 2012-02-12 NOTE — Progress Notes (Signed)
This is a very nice 46 year old Caucasian female with chronic acid reflux.  She is currently asymptomatic on Protonix 40 mg a day.  She denies dysphagia, hepatobiliary, or lower GI problems.  He previously had an unexplained iron deficiency anemia felt probably secondary to menorrhagia.  She was on iron therapy with resolution of her anemia, but her hemoglobin has not been checked in 1 year.  She currently takes over-the-counter  Multivitamins but apparently did contain some iron products.  Other meds include medications for asthma and Loestrin.  He continues to menstruate but feels like she  is near menopause.  Current Medications, Allergies, Past Medical History, Past Surgical History, Family History and Social History were reviewed in [employer] record.  Pertinent Review of Systems Negative   Physical Exam: Healthy-appearing patient in no distress.  Blood pressure 140/80, pulse 84 and regular, and weight 182 pounds with a BMI of 31.38.  I cannot appreciate stigmata of chronic liver disease.  Chest is clear, and she is in a regular rhythm without murmurs gallops or rubs.  Her abdomen shows no organomegaly, masses or tenderness.  Bowel sounds are normal.  Rectal exam was deferred.  Mental status is normal    Assessment and Plan: Chronic GERD well-controlled on daily PPI therapy.  Her Protonix was renewed today.  We'll repeat her CBC and anemia profile.  He is up-to-date on her endoscopic exams. Encounter Diagnosis  Name Primary?  Marland Kitchen Anemia Yes

## 2012-02-12 NOTE — Patient Instructions (Signed)
We have sent the following medications to your pharmacy for you to pick up at your convenience: Protonix   Your physician has requested that you go to the basement for the following lab work before leaving today: CBC and Anemia Panel

## 2012-02-22 ENCOUNTER — Ambulatory Visit (INDEPENDENT_AMBULATORY_CARE_PROVIDER_SITE_OTHER): Payer: BC Managed Care – PPO | Admitting: Obstetrics & Gynecology

## 2012-02-22 ENCOUNTER — Encounter: Payer: BC Managed Care – PPO | Admitting: Obstetrics & Gynecology

## 2012-02-22 ENCOUNTER — Encounter: Payer: Self-pay | Admitting: Obstetrics & Gynecology

## 2012-02-22 VITALS — BP 142/89 | HR 86 | Ht 63.0 in | Wt 187.0 lb

## 2012-02-22 DIAGNOSIS — Z01419 Encounter for gynecological examination (general) (routine) without abnormal findings: Secondary | ICD-10-CM

## 2012-02-22 DIAGNOSIS — Z1151 Encounter for screening for human papillomavirus (HPV): Secondary | ICD-10-CM

## 2012-02-22 DIAGNOSIS — N951 Menopausal and female climacteric states: Secondary | ICD-10-CM

## 2012-02-22 DIAGNOSIS — Z0000 Encounter for general adult medical examination without abnormal findings: Secondary | ICD-10-CM

## 2012-02-22 DIAGNOSIS — Z124 Encounter for screening for malignant neoplasm of cervix: Secondary | ICD-10-CM

## 2012-02-22 DIAGNOSIS — R232 Flushing: Secondary | ICD-10-CM

## 2012-02-22 DIAGNOSIS — R635 Abnormal weight gain: Secondary | ICD-10-CM

## 2012-02-22 NOTE — Progress Notes (Signed)
Here today for new gyn physical, no complaints.

## 2012-02-22 NOTE — Progress Notes (Signed)
Subjective:    Hannah Rocha is a 46 y.o. female who presents for an annual exam. She complains of hot flashes which were not relieved by her OCPs. She ran out last week and plans to use condoms until she can get her tubes tied.  She is interested in permanent sterility. She also complains of weight gain.  The patient is sexually active. GYN screening history: last pap: was normal. The patient wears seatbelts: yes. The patient participates in regular exercise: no. Has the patient ever been transfused or tattooed?: no. The patient reports that there is not domestic violence in her life.   Menstrual History: OB History    Grav Para Term Preterm Abortions TAB SAB Ect Mult Living                  Menarche age: 23 Patient's last menstrual period was 02/11/2012.    The following portions of the patient's history were reviewed and updated as appropriate: allergies, current medications, past family history, past medical history, past social history, past surgical history and problem list.  Review of Systems A comprehensive review of systems was negative. She has already had a flu vaccine this year. Her mammogram was in 6/13 and she required a breast cyst drainage. She has worked in [employer] for 25 years. Been monogamous for 25 years.   Objective:    BP 148/90  Pulse 86  Ht 5\' 3"  (1.6 m)  Wt 187 lb (84.823 kg)  BMI 33.13 kg/m2  LMP 02/11/2012  General Appearance:    Alert, cooperative, no distress, appears stated age  Head:    Normocephalic, without obvious abnormality, atraumatic  Eyes:    PERRL, conjunctiva/corneas clear, EOM's intact, fundi    benign, both eyes  Ears:    Normal TM's and external ear canals, both ears  Nose:   Nares normal, septum midline, mucosa normal, no drainage    or sinus tenderness  Throat:   Lips, mucosa, and tongue normal; teeth and gums normal  Neck:   Supple, symmetrical, trachea midline, no adenopathy;    thyroid:  no  enlargement/tenderness/nodules; no carotid   bruit or JVD  Back:     Symmetric, no curvature, ROM normal, no CVA tenderness  Lungs:     Clear to auscultation bilaterally, respirations unlabored  Chest Wall:    No tenderness or deformity   Heart:    Regular rate and rhythm, S1 and S2 normal, no murmur, rub   or gallop  Breast Exam:    No tenderness, masses, or nipple abnormality  Abdomen:     Soft, non-tender, bowel sounds active all four quadrants,    no masses, no organomegaly  Genitalia:    Normal female without lesion, discharge or tenderness, nulliparous cervix, NSSA, NT, no adnexal masses or tenderness     Extremities:   Extremities normal, atraumatic, no cyanosis or edema  Pulses:   2+ and symmetric all extremities  Skin:   Skin color, texture, turgor normal, no rashes or lesions  Lymph nodes:   Cervical, supraclavicular, and axillary nodes normal  Neurologic:   CNII-XII intact, normal strength, sensation and reflexes    throughout  .    Assessment:    Healthy female exam.   Weight gain Hot flashes Desire for permanent sterility Plan:     Thin prep Pap smear.  TSH and FSH I will send a note to [place] about scheduling a BTL.

## 2012-02-23 LAB — TSH: TSH: 3.409 u[IU]/mL (ref 0.350–4.500)

## 2012-03-08 ENCOUNTER — Encounter (HOSPITAL_COMMUNITY): Payer: Self-pay | Admitting: Pharmacist

## 2012-03-18 ENCOUNTER — Encounter (HOSPITAL_COMMUNITY): Payer: Self-pay

## 2012-03-18 ENCOUNTER — Encounter (HOSPITAL_COMMUNITY)
Admission: RE | Admit: 2012-03-18 | Discharge: 2012-03-18 | Disposition: A | Discharge status: Home / Self Care | Payer: BC Managed Care – PPO | Source: Ambulatory Visit | Attending: Obstetrics & Gynecology | Admitting: Obstetrics & Gynecology

## 2012-03-18 ENCOUNTER — Ambulatory Visit: Payer: BC Managed Care – PPO | Admitting: Obstetrics & Gynecology

## 2012-03-18 HISTORY — DX: Other specified postprocedural states: Z98.890

## 2012-03-18 HISTORY — DX: Nausea with vomiting, unspecified: R11.2

## 2012-03-18 LAB — CBC
HCT: 37.1 % (ref 36.0–46.0)
Hemoglobin: 13.2 g/dL (ref 12.0–15.0)
MCH: 31.9 pg (ref 26.0–34.0)
MCHC: 35.6 g/dL (ref 30.0–36.0)
MCV: 89.6 fL (ref 78.0–100.0)
RBC: 4.14 MIL/uL (ref 3.87–5.11)

## 2012-03-18 LAB — SURGICAL PCR SCREEN: MRSA, PCR: NEGATIVE

## 2012-03-18 NOTE — Patient Instructions (Addendum)
20 Gretel Cantu  03/18/2012   Your procedure is scheduled on:  03/21/12  Enter through the Main Entrance of [facility] at 1000 AM.  Pick up the phone at the desk and dial [phone].   Call this number if you have problems the morning of surgery: [phone]   Remember:   Do not eat food:After Midnight.  Do not drink clear liquids: After Midnight.  Take these medicines the morning of surgery with A SIP OF WATER: Protonix   Do not wear jewelry, make-up or nail polish.  Do not wear lotions, powders, or perfumes. You may wear deodorant.  Do not shave 48 hours prior to surgery.  Do not bring valuables to the hospital.  Contacts, dentures or bridgework may not be worn into surgery.  Leave suitcase in the car. After surgery it may be brought to your room.  For patients admitted to the hospital, checkout time is 11:00 AM the day of discharge.   Patients discharged the day of surgery will not be allowed to drive home.  Name and phone number of your driver: Husband  Lajuanda Penick  Special Instructions: Shower using CHG 2 nights before surgery and the night before surgery.  If you shower the day of surgery use CHG.  Use special wash - you have one bottle of CHG for all showers.  You should use approximately 1/3 of the bottle for each shower.   Please read over the following fact sheets that you were given: MRSA Information

## 2012-03-21 ENCOUNTER — Ambulatory Visit (HOSPITAL_COMMUNITY): Payer: BC Managed Care – PPO | Admitting: Anesthesiology

## 2012-03-21 ENCOUNTER — Ambulatory Visit (HOSPITAL_COMMUNITY)
Admission: RE | Admit: 2012-03-21 | Discharge: 2012-03-21 | Disposition: A | Discharge status: Home / Self Care | Payer: BC Managed Care – PPO | Source: Ambulatory Visit | Attending: Obstetrics & Gynecology | Admitting: Obstetrics & Gynecology

## 2012-03-21 ENCOUNTER — Encounter (HOSPITAL_COMMUNITY): Payer: Self-pay | Admitting: Anesthesiology

## 2012-03-21 ENCOUNTER — Encounter (HOSPITAL_COMMUNITY)
Admission: RE | Disposition: A | Discharge status: Home / Self Care | Payer: Self-pay | Source: Ambulatory Visit | Attending: Obstetrics & Gynecology

## 2012-03-21 DIAGNOSIS — Z01818 Encounter for other preprocedural examination: Secondary | ICD-10-CM | POA: Insufficient documentation

## 2012-03-21 DIAGNOSIS — R635 Abnormal weight gain: Secondary | ICD-10-CM | POA: Insufficient documentation

## 2012-03-21 DIAGNOSIS — Z01812 Encounter for preprocedural laboratory examination: Secondary | ICD-10-CM | POA: Insufficient documentation

## 2012-03-21 DIAGNOSIS — Z302 Encounter for sterilization: Secondary | ICD-10-CM | POA: Insufficient documentation

## 2012-03-21 HISTORY — PX: LAPAROSCOPIC TUBAL LIGATION: SHX1937

## 2012-03-21 LAB — PREGNANCY, URINE: Preg Test, Ur: NEGATIVE

## 2012-03-21 SURGERY — LIGATION, FALLOPIAN TUBE, LAPAROSCOPIC
Anesthesia: General | Site: Abdomen | Laterality: Bilateral | Wound class: Clean Contaminated

## 2012-03-21 MED ORDER — MEPERIDINE HCL 25 MG/ML IJ SOLN: 6.2500 mg | INTRAMUSCULAR | Status: DC | PRN

## 2012-03-21 MED ORDER — MIDAZOLAM HCL 2 MG/2ML IJ SOLN
INTRAMUSCULAR | Status: AC
  Filled 2012-03-21: qty 2

## 2012-03-21 MED ORDER — GLYCOPYRROLATE 0.2 MG/ML IJ SOLN
INTRAMUSCULAR | Status: DC | PRN
  Administered 2012-03-21: 0.6 mg via INTRAVENOUS

## 2012-03-21 MED ORDER — METOCLOPRAMIDE HCL 5 MG/ML IJ SOLN: 10.0000 mg | Freq: Once | INTRAMUSCULAR | Status: DC | PRN

## 2012-03-21 MED ORDER — DEXAMETHASONE SODIUM PHOSPHATE 10 MG/ML IJ SOLN
INTRAMUSCULAR | Status: AC
  Filled 2012-03-21: qty 1

## 2012-03-21 MED ORDER — ROCURONIUM BROMIDE 100 MG/10ML IV SOLN
INTRAVENOUS | Status: DC | PRN
  Administered 2012-03-21: 30 mg via INTRAVENOUS

## 2012-03-21 MED ORDER — FENTANYL CITRATE 0.05 MG/ML IJ SOLN
INTRAMUSCULAR | Status: DC | PRN
  Administered 2012-03-21: 100 ug via INTRAVENOUS
  Administered 2012-03-21 (×3): 50 ug via INTRAVENOUS

## 2012-03-21 MED ORDER — CEFAZOLIN SODIUM-DEXTROSE 2-3 GM-% IV SOLR
INTRAVENOUS | Status: AC
  Filled 2012-03-21: qty 50

## 2012-03-21 MED ORDER — DEXAMETHASONE SODIUM PHOSPHATE 4 MG/ML IJ SOLN
INTRAMUSCULAR | Status: DC | PRN
  Administered 2012-03-21: 8 mg via INTRAVENOUS

## 2012-03-21 MED ORDER — MIDAZOLAM HCL 5 MG/5ML IJ SOLN
INTRAMUSCULAR | Status: DC | PRN
  Administered 2012-03-21: 2 mg via INTRAVENOUS

## 2012-03-21 MED ORDER — HYDROCODONE-ACETAMINOPHEN 5-500 MG PO TABS: 1.0000 | ORAL_TABLET | ORAL | Status: DC | PRN

## 2012-03-21 MED ORDER — KETOROLAC TROMETHAMINE 30 MG/ML IJ SOLN
INTRAMUSCULAR | Status: DC | PRN
  Administered 2012-03-21: 30 mg via INTRAVENOUS

## 2012-03-21 MED ORDER — LIDOCAINE HCL (CARDIAC) 20 MG/ML IV SOLN
INTRAVENOUS | Status: AC
  Filled 2012-03-21: qty 5

## 2012-03-21 MED ORDER — PROPOFOL 10 MG/ML IV EMUL
INTRAVENOUS | Status: DC | PRN
  Administered 2012-03-21: 200 mg via INTRAVENOUS
  Administered 2012-03-21: 70 mg via INTRAVENOUS

## 2012-03-21 MED ORDER — NEOSTIGMINE METHYLSULFATE 1 MG/ML IJ SOLN
INTRAMUSCULAR | Status: DC | PRN
  Administered 2012-03-21: 3 mg via INTRAVENOUS

## 2012-03-21 MED ORDER — CEFAZOLIN SODIUM-DEXTROSE 2-3 GM-% IV SOLR
2.0000 g | INTRAVENOUS | Status: AC
  Administered 2012-03-21: 2 g via INTRAVENOUS

## 2012-03-21 MED ORDER — SCOPOLAMINE 1 MG/3DAYS TD PT72
1.0000 | MEDICATED_PATCH | TRANSDERMAL | Status: DC
  Administered 2012-03-21: 1.5 mg via TRANSDERMAL

## 2012-03-21 MED ORDER — ONDANSETRON HCL 4 MG/2ML IJ SOLN
INTRAMUSCULAR | Status: DC | PRN
  Administered 2012-03-21: 4 mg via INTRAVENOUS

## 2012-03-21 MED ORDER — HYDROCODONE-ACETAMINOPHEN 5-325 MG PO TABS
1.0000 | ORAL_TABLET | Freq: Once | ORAL | Status: AC
  Administered 2012-03-21: 1 via ORAL

## 2012-03-21 MED ORDER — FENTANYL CITRATE 0.05 MG/ML IJ SOLN: 25.0000 ug | INTRAMUSCULAR | Status: DC | PRN

## 2012-03-21 MED ORDER — HYDROCODONE-ACETAMINOPHEN 5-325 MG PO TABS
ORAL_TABLET | ORAL | Status: AC
  Filled 2012-03-21: qty 1

## 2012-03-21 MED ORDER — LIDOCAINE HCL (CARDIAC) 20 MG/ML IV SOLN
INTRAVENOUS | Status: DC | PRN
  Administered 2012-03-21: 80 mg via INTRAVENOUS

## 2012-03-21 MED ORDER — BUPIVACAINE HCL (PF) 0.5 % IJ SOLN
INTRAMUSCULAR | Status: AC
  Filled 2012-03-21: qty 30

## 2012-03-21 MED ORDER — PROPOFOL 10 MG/ML IV EMUL
INTRAVENOUS | Status: AC
  Filled 2012-03-21: qty 20

## 2012-03-21 MED ORDER — ONDANSETRON HCL 4 MG/2ML IJ SOLN
INTRAMUSCULAR | Status: AC
  Filled 2012-03-21: qty 2

## 2012-03-21 MED ORDER — LACTATED RINGERS IV SOLN
INTRAVENOUS | Status: DC
  Administered 2012-03-21 (×2): via INTRAVENOUS

## 2012-03-21 MED ORDER — FENTANYL CITRATE 0.05 MG/ML IJ SOLN
INTRAMUSCULAR | Status: AC
  Filled 2012-03-21: qty 5

## 2012-03-21 MED ORDER — SCOPOLAMINE 1 MG/3DAYS TD PT72
MEDICATED_PATCH | TRANSDERMAL | Status: AC
  Administered 2012-03-21: 1.5 mg via TRANSDERMAL
  Filled 2012-03-21: qty 1

## 2012-03-21 MED ORDER — BUPIVACAINE HCL (PF) 0.5 % IJ SOLN
INTRAMUSCULAR | Status: DC | PRN
  Administered 2012-03-21: 20 mL

## 2012-03-21 SURGICAL SUPPLY — 20 items
CABLE HIGH FREQUENCY MONO STRZ (ELECTRODE) IMPLANT
CATH ROBINSON RED A/P 16FR (CATHETERS) ×2 IMPLANT
CHLORAPREP W/TINT 26ML (MISCELLANEOUS) ×2 IMPLANT
CLOTH BEACON ORANGE TIMEOUT ST (SAFETY) ×2 IMPLANT
DILATOR CANAL MILEX (MISCELLANEOUS) ×1 IMPLANT
ELECT REM PT RETURN 9FT ADLT (ELECTROSURGICAL)
ELECTRODE REM PT RTRN 9FT ADLT (ELECTROSURGICAL) IMPLANT
GLOVE BIO SURGEON STRL SZ 6.5 (GLOVE) ×4 IMPLANT
GOWN PREVENTION PLUS LG XLONG (DISPOSABLE) ×4 IMPLANT
NDL INSUFFLATION 14GA 120MM (NEEDLE) ×1 IMPLANT
NDL SAFETY ECLIPSE 18X1.5 (NEEDLE) ×1 IMPLANT
NEEDLE HYPO 18GX1.5 SHARP (NEEDLE) ×2
NEEDLE INSUFFLATION 14GA 120MM (NEEDLE) ×2 IMPLANT
PACK LAPAROSCOPY BASIN (CUSTOM PROCEDURE TRAY) ×2 IMPLANT
SUT VICRYL 0 UR6 27IN ABS (SUTURE) ×2 IMPLANT
SUT VICRYL 4-0 PS2 18IN ABS (SUTURE) ×2 IMPLANT
TOWEL OR 17X24 6PK STRL BLUE (TOWEL DISPOSABLE) ×4 IMPLANT
TROCAR XCEL NON-BLD 11X100MML (ENDOMECHANICALS) ×2 IMPLANT
TROCAR Z-THREAD FIOS 5X100MM (TROCAR) ×2 IMPLANT
WATER STERILE IRR 1000ML POUR (IV SOLUTION) ×2 IMPLANT

## 2012-03-21 NOTE — Op Note (Signed)
03/21/2012  1:08 PM  PATIENT:  Hannah Rocha  46 y.o. female  PRE-OPERATIVE DIAGNOSIS:  Desires sterilization  POST-OPERATIVE DIAGNOSIS:  Desires sterilization  PROCEDURE:  BILATERAL SALPINGECTOMY  SURGEON:  Surgeon(s) and Role:    * Allie Bossier, MD - Primary     PHYSICIAN ASSISTANT:   ASSISTANTS: Catalina Antigua, MD   ANESTHESIA:   general  EBL:  Total I/O In: 1325 [I.V.:1325] Out: 115 [Urine:100; Blood:15]  BLOOD ADMINISTERED:none  DRAINS: none   LOCAL MEDICATIONS USED:  MARCAINE     SPECIMEN:  Source of Specimen:  bilateral salpingectomy  DISPOSITION OF SPECIMEN:  PATHOLOGY  COUNTS:  YES  TOURNIQUET:  * No tourniquets in log *  DICTATION: .Dragon Dictation  PLAN OF CARE: Discharge to home after PACU  PATIENT DISPOSITION:  PACU - hemodynamically stable.   Delay start of Pharmacological VTE agent (>24hrs) due to surgical blood loss or risk of bleeding: not applicable  The risks, benefits, and alternatives of surgery were explained, understood, accepted. She is certain that she wants permanent sterility. She understands there is a small failure rate of this procedure. She also would like to have both oviducts removed due to the newest recommendations to help prevent ovarian/peritoneal cancer. In the operating room she was placed in the dorsal lithotomy position, and general anesthesia was given without complication. Her abdomen and vagina were prepped and draped in the usual sterile fashion. A timeout procedure was done. A bimanual exam revealed a ULN size anteverted and mobile uterus. Her adnexa felt normal. A Hulka manipulator was placed. Her bladder was emptied with a Robinson catheter. Gloves were changed, and attention was turned to the abdomen. Approximately the 10 mL of 0.5% Marcaine was injected into the umbilicus. A vertical incision was made at the site. A Veress needle was placed intraperitoneally. Low-flow CO2 was used to insufflate the abdomen to  approximately 3-1/2 L. Once a good pneumoperitoneum was established, a 10 mm Excel trocar was placed. Laparoscopy confirmed correct placement.a 5 mm port was placed in each lower quadrant under direct laparoscopic visualization after injecting 0.5% Marcaine in the incision sites. Her pelvis appeared normal but her uterus was grossly enlarged with several 4 cm fundal pedunculated fibroids noted. A harmonic scapel was used to hemostatically remove both oviducts. I switched to a 5 mm camera and removed the oviducts through the umbilical port. I removed the 5 mm ports and noted hemostasis. The umbilical fascia was closed with a 0 vicryl suture. A subcuticular closure was done with 4-0 Vicryl suture at each incision, and  Steri-Strip was placed across the incisions. She was extubated and taken to the recovery room in stable condition. She tolerated the procedure well.

## 2012-03-21 NOTE — Anesthesia Postprocedure Evaluation (Signed)
  Anesthesia Post-op Note  Patient: Hannah Rocha  Procedure(s) Performed: Procedure(s) (LRB) with comments: LAPAROSCOPIC TUBAL LIGATION (Bilateral)  Patient Location: PACU  Anesthesia Type:General  Level of Consciousness: awake, alert  and oriented  Airway and Oxygen Therapy: Patient Spontanous Breathing  Post-op Pain: mild  Post-op Assessment: Post-op Vital signs reviewed, Patient's Cardiovascular Status Stable, Respiratory Function Stable, Patent Airway, No signs of Nausea or vomiting and Pain level controlled  Post-op Vital Signs: Reviewed and stable  Complications: No apparent anesthesia complications

## 2012-03-21 NOTE — Anesthesia Preprocedure Evaluation (Addendum)
Anesthesia Evaluation  Patient identified by MRN, date of birth, ID band Patient awake    Reviewed: Allergy & Precautions, H&P , NPO status , Patient's Chart, lab work & pertinent test results  History of Anesthesia Complications (+) PONV  Airway Mallampati: III TM Distance: >3 FB Neck ROM: Full    Dental No notable dental hx. (+) Teeth Intact   Pulmonary pneumonia -, resolved,  breath sounds clear to auscultation  Pulmonary exam normal       Cardiovascular negative cardio ROS  Rhythm:Regular Rate:Normal  S/P ASD repair   Neuro/Psych PSYCHIATRIC DISORDERS negative neurological ROS     GI/Hepatic Neg liver ROS, hiatal hernia, GERD-  Medicated and Controlled,  Endo/Other  negative endocrine ROS  Renal/GU negative Renal ROS  negative genitourinary   Musculoskeletal negative musculoskeletal ROS (+)   Abdominal (+) + obese,   Peds  Hematology negative hematology ROS (+)   Anesthesia Other Findings   Reproductive/Obstetrics Desires Permanent Sterilization                          Anesthesia Physical Anesthesia Plan  ASA: II  Anesthesia Plan: General   Post-op Pain Management:    Induction: Intravenous  Airway Management Planned: Oral ETT  Additional Equipment:   Intra-op Plan:   Post-operative Plan: Extubation in OR  Informed Consent: I have reviewed the patients History and Physical, chart, labs and discussed the procedure including the risks, benefits and alternatives for the proposed anesthesia with the patient or authorized representative who has indicated his/her understanding and acceptance.   Dental advisory given  Plan Discussed with: CRNA, Anesthesiologist and Surgeon  Anesthesia Plan Comments:         Anesthesia Quick Evaluation

## 2012-03-21 NOTE — Transfer of Care (Signed)
Immediate Anesthesia Transfer of Care Note  Patient: Hannah Rocha  Procedure(s) Performed: Procedure(s) (LRB) with comments: LAPAROSCOPIC TUBAL LIGATION (Bilateral)  Patient Location: PACU  Anesthesia Type:General  Level of Consciousness: awake, alert  and oriented  Airway & Oxygen Therapy: Patient Spontanous Breathing and Patient connected to nasal cannula oxygen  Post-op Assessment: Report given to PACU RN and Post -op Vital signs reviewed and stable  Post vital signs: stable  Complications: No apparent anesthesia complications

## 2012-03-21 NOTE — H&P (Signed)
  Hannah Rocha is a 46 y.o. female who presents for an annual exam. She complains of hot flashes which were not relieved by her OCPs. She ran out last week and plans to use condoms until she can get her tubes tied. She is interested in permanent sterility. She also complains of weight gain. The patient is sexually active. GYN screening history: last pap: was normal. The patient wears seatbelts: yes. The patient participates in regular exercise: no. Has the patient ever been transfused or tattooed?: no. The patient reports that there is not domestic violence in her life.  Menstrual History:  OB History    Grav  Para  Term  Preterm  Abortions  TAB  SAB  Ect  Mult  Living                 Menarche age: 57  Patient's last menstrual period was 02/11/2012.  The following portions of the patient's history were reviewed and updated as appropriate: allergies, current medications, past family history, past medical history, past social history, past surgical history and problem list.  Review of Systems  A comprehensive review of systems was negative. She has already had a flu vaccine this year. Her mammogram was in 6/13 and she required a breast cyst drainage. She has worked in [employer] for 25 years. Been monogamous for 25 years.  Objective:   BP 148/90  Pulse 86  Ht 5\' 3"  (1.6 m)  Wt 187 lb (84.823 kg)  BMI 33.13 kg/m2  LMP 02/11/2012  General Appearance:  Alert, cooperative, no distress, appears stated age   Head:  Normocephalic, without obvious abnormality, atraumatic   Eyes:  PERRL, conjunctiva/corneas clear, EOM's intact, fundi  benign, both eyes   Ears:  Normal TM's and external ear canals, both ears   Nose:  Nares normal, septum midline, mucosa normal, no drainage or sinus tenderness   Throat:  Lips, mucosa, and tongue normal; teeth and gums normal   Neck:  Supple, symmetrical, trachea midline, no adenopathy;  thyroid: no enlargement/tenderness/nodules; no carotid  bruit or JVD     Back:  Symmetric, no curvature, ROM normal, no CVA tenderness   Lungs:  Clear to auscultation bilaterally, respirations unlabored   Chest Wall:  No tenderness or deformity   Heart:  Regular rate and rhythm, S1 and S2 normal, no murmur, rub or gallop   Breast Exam:  No tenderness, masses, or nipple abnormality   Abdomen:  Soft, non-tender, bowel sounds active all four quadrants,  no masses, no organomegaly   Genitalia:  Normal female without lesion, discharge or tenderness, nulliparous cervix, NSSA, NT, no adnexal masses or tenderness      Extremities:  Extremities normal, atraumatic, no cyanosis or edema   Pulses:  2+ and symmetric all extremities   Skin:  Skin color, texture, turgor normal, no rashes or lesions   Lymph nodes:  Cervical, supraclavicular, and axillary nodes normal   Neurologic:  CNII-XII intact, normal strength, sensation and reflexes  throughout   .  Assessment:   Healthy female exam.  Weight gain  Hot flashes  Desire for permanent sterility  Plan:    I will plan to do a laparoscopic bilateral salpingectomy. We have discussed the rare risk of failure. She is also aware of risks of surgery including but not limited to infection, bleeding, DVT, damage to bowel, bladder ureters.

## 2012-03-23 ENCOUNTER — Encounter (HOSPITAL_COMMUNITY): Payer: Self-pay | Admitting: Obstetrics & Gynecology

## 2012-03-25 ENCOUNTER — Encounter: Payer: Self-pay | Admitting: Obstetrics & Gynecology

## 2012-03-25 ENCOUNTER — Ambulatory Visit (INDEPENDENT_AMBULATORY_CARE_PROVIDER_SITE_OTHER): Payer: BC Managed Care – PPO | Admitting: Obstetrics & Gynecology

## 2012-03-25 VITALS — BP 150/87 | HR 80 | Ht 64.0 in | Wt 192.0 lb

## 2012-03-25 DIAGNOSIS — Z0000 Encounter for general adult medical examination without abnormal findings: Secondary | ICD-10-CM

## 2012-03-25 DIAGNOSIS — Z09 Encounter for follow-up examination after completed treatment for conditions other than malignant neoplasm: Secondary | ICD-10-CM

## 2012-03-25 MED ORDER — IBUPROFEN 800 MG PO TABS: 800.0000 mg | ORAL_TABLET | Freq: Three times a day (TID) | ORAL | Status: DC | PRN

## 2012-03-25 NOTE — Progress Notes (Signed)
  Subjective:    Patient ID: Hannah Rocha, female    DOB: August 26, 1965, 47 y.o.   MRN: [id number]  HPI  She is here to go over blood work done at her pap smear. It was all normal. She is having no post op problems.   Review of Systems     Objective:   Physical Exam  Incisions healing well Abdomen benign     Assessment & Plan:  Post op doing well RTC 1year

## 2012-03-31 ENCOUNTER — Telehealth: Payer: Self-pay | Admitting: Family Medicine

## 2012-03-31 NOTE — Telephone Encounter (Signed)
Patient Information:  Caller Name: Jahnia  Phone: [phone]  Patient: Hannah Rocha, Hannah Rocha  Gender: Female  DOB: 1965/07/18  Age: 47 Years  PCP: [facility])  Pregnant: No  Office Follow Up:  Does the office need to follow up with this patient?: No  Instructions For The Office: N/A  RN Note:  She has appt for Monday 04/04/12 at 08:00  Symptoms  Reason For Call & Symptoms: Patient states she is having headaches.(  She states recent surgery 03/21/2012 tubal removal /outpatient. ) She had been having issues with her blood pressure readings-  B/P 153/80 . She has been taking it at home and work.  She is not on any medication.  Slight dizziness brief, Headache behind  her eyes. Described a "dull and I know its there".  she was on Hydrocodone from 12/30- 03/24/12 for her surgery.  Reviewed Health History In EMR: Yes  Reviewed Medications In EMR: Yes  Reviewed Allergies In EMR: Yes  Reviewed Surgeries / Procedures: No  Date of Onset of Symptoms: 03/30/2012  Treatments Tried: Treatment with Tylenol.  Treatments Tried Worked: Yes OB / GYN:  LMP: Unknown  Guideline(s) Used:  Headache  High Blood Pressure  Disposition Per Guideline:   See Within 2 Weeks in Office  Reason For Disposition Reached:   BP > 140/90 and is not taking BP medications  Advice Given:  Pain Medicines:  For pain relief, you can take either acetaminophen, ibuprofen, or naproxen.  They are over-the-counter (OTC) pain drugs. You can buy them at the drugstore.  Ibuprofen (e.g., Motrin, Advil):  Take 400 mg (two 200 mg pills) by mouth every 6 hours.  Another choice is to take 600 mg (three 200 mg pills) by mouth every 8 hours.  Rest:   Lie down in a dark, quiet place and try to relax. Close your eyes and imagine your entire body relaxing.  Apply Cold to the Area:   Apply a cold wet washcloth or cold pack to the forehead for 20 minutes.  Call Back If:  Headache lasts longer than 24 hours  You become  worse.  BP 120-139 / 80-89   This is considered borderline high blood pressure, or "prehypertension".  Sometimes, changes in your lifestyle can reduce your blood pressure without medications.  If your blood pressure stays elevated during the next month, you should go in to see the doctor and get your blood pressure checked.  How Much Sodium (Salt) Should You Eat Each Day?  Aim to eat less than 1,500 mg of sodium each day.  Remember that one teaspoon of salt has 2,300 mg of sodium.  Unfortunately, 75% of the salt in the average person's diet is in pre-processed foods.  How To Reduce Your Sodium (Salt) Intake - DO NOT:  Buy or eat heavily salted foods. Examples include pickled foods, salted crackers or chips, and processed meats.  Add salt while cooking or at the table.  Call Back If:  Headache, blurred vision, difficulty talking, or difficulty walking occurs  Chest pain or difficulty breathing occurs  You want to come in to the office for a blood pressure check  You become worse.

## 2012-04-04 ENCOUNTER — Ambulatory Visit (INDEPENDENT_AMBULATORY_CARE_PROVIDER_SITE_OTHER): Payer: BC Managed Care – PPO | Admitting: Family Medicine

## 2012-04-04 ENCOUNTER — Ambulatory Visit: Payer: BC Managed Care – PPO | Admitting: Family Medicine

## 2012-04-04 ENCOUNTER — Encounter: Payer: Self-pay | Admitting: Family Medicine

## 2012-04-04 VITALS — BP 140/80 | HR 89 | Temp 98.2°F | Wt 182.0 lb

## 2012-04-04 DIAGNOSIS — F419 Anxiety disorder, unspecified: Secondary | ICD-10-CM

## 2012-04-04 DIAGNOSIS — I10 Essential (primary) hypertension: Secondary | ICD-10-CM | POA: Insufficient documentation

## 2012-04-04 DIAGNOSIS — R030 Elevated blood-pressure reading, without diagnosis of hypertension: Secondary | ICD-10-CM | POA: Insufficient documentation

## 2012-04-04 DIAGNOSIS — IMO0001 Reserved for inherently not codable concepts without codable children: Secondary | ICD-10-CM | POA: Insufficient documentation

## 2012-04-04 DIAGNOSIS — F411 Generalized anxiety disorder: Secondary | ICD-10-CM | POA: Insufficient documentation

## 2012-04-04 MED ORDER — BUSPIRONE HCL 15 MG PO TABS: 7.5000 mg | ORAL_TABLET | Freq: Two times a day (BID) | ORAL | Status: DC

## 2012-04-04 NOTE — Patient Instructions (Addendum)
Stop by to see Hannah Rocha on your way out- she will set up your appointment with Dr. Laymond Purser. Come see me in 1 month for follow up.

## 2012-04-04 NOTE — Progress Notes (Signed)
Subjective:    Patient ID: Hannah Rocha, female    DOB: 1966-01-13, 47 y.o.   MRN: [id number]  HPI  47 yo female here to "check blood pressure."  Has been under significant stress lately.  Her mom died 4 months ago.  Since then, she feels anxious, very tearful. She feels she is having panic attacks- her face gets flushed and her heart races when she gets anxious. Tried Zoloft in past.  Did not like how it made her feel- did not "feel like herself."  Does not want to take anything that is habit forming.  No HA, blurred vision, CP or SOB.  Denies any SI or HI.  Patient Active Problem List  Diagnosis  . ANEMIA-IRON DEFICIENCY  . ADJUSTMENT DISORDER WITH MIXED FEATURES  . Esophageal Reflux  . HIATAL HERNIA  . IBS  . MENOPAUSAL SYNDROME  . ATRIAL SEPTAL DEFECT, SECUNDUM TYPE  . 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  . IRON DEFICIENCY ANEMIA, HX OF  . CARDIAC MURMUR, HX OF  . Anemia  . GERD (gastroesophageal reflux disease)  . Menorrhagia  . URI (upper respiratory infection)  . Elevated blood pressure  . Anxiety   Past Medical History  Diagnosis Date  . Anemia   . Allergy   . GERD (gastroesophageal reflux disease)   . Obesity   . PONV (postoperative nausea and vomiting)   . Hiatal hernia 07/17/2009    EGD    Past Surgical History  Procedure Date  . Asd repair   . Mandible fracture surgery   . Cleft lip repair   . Laparoscopic tubal ligation 03/21/2012    Procedure: LAPAROSCOPIC TUBAL LIGATION;  Surgeon: Allie Bossier, MD;  Location: [facility];  Service: Gynecology;  Laterality: Bilateral;  . Tubal ligation    History  Substance Use Topics  . Smoking status: Former Smoker    Quit date: 02/22/1987  . Smokeless tobacco: Never Used     Comment: 22 years ago-light smoker  . Alcohol Use: Yes     Comment: occasional   Family History  Problem Relation Age of Onset  . Heart disease Mother   . Colon polyps Mother   . Prostate cancer Father   . Heart disease  Father   . Colon cancer Neg Hx    Allergies  Allergen Reactions  . Sertraline Hcl     REACTION: vomiting, severe anxiety   Current Outpatient Prescriptions on File Prior to Visit  Medication Sig Dispense Refill  . flintstones complete (FLINTSTONES) 60 MG chewable tablet Chew 1 tablet by mouth daily.      . fluticasone (FLONASE) 50 MCG/ACT nasal spray Place 2 sprays into the nose daily.  16 g  2  . ibuprofen (ADVIL,MOTRIN) 800 MG tablet Take 1 tablet (800 mg total) by mouth every 8 (eight) hours as needed for pain.  60 tablet  1  . loratadine (ALAVERT) 10 MG tablet Take 10 mg by mouth daily.        . pantoprazole (PROTONIX) 40 MG tablet Take 1 tablet (40 mg total) by mouth daily.  30 tablet  7   The PMH, PSH, Social History, Family History, Medications, and allergies have been reviewed in [facility], and have been updated if relevant.   Review of Systems    See HPI Objective:   Physical Exam BP 140/80  Pulse 89  Temp 98.2 F (36.8 C) (Tympanic)  Wt 182 lb (82.555 kg)  SpO2 98%  LMP 03/04/2012 Gen:  Alert, very  tearful and anxious Resp:  CTA bilaterally CVS: RRR Psych:  Good eye contact, anxious       Assessment & Plan:   1. Elevated blood pressure  Normotensive today.  This is likely due to anxiety.   2. Anxiety  >25 min spent with face to face with patient, >50% counseling and/or coordinating care She is very hesistant to start medication but did agree to try Buspar 7.5 mg twice daily along with referral for psychotherapy. She will follow up in 1 month. The patient indicates understanding of these issues and agrees with the plan.  Ambulatory referral to Psychology

## 2012-04-26 ENCOUNTER — Ambulatory Visit (INDEPENDENT_AMBULATORY_CARE_PROVIDER_SITE_OTHER): Payer: BC Managed Care – PPO | Admitting: Psychology

## 2012-04-26 DIAGNOSIS — F411 Generalized anxiety disorder: Secondary | ICD-10-CM

## 2012-05-06 ENCOUNTER — Ambulatory Visit: Payer: BC Managed Care – PPO | Admitting: Family Medicine

## 2012-05-09 ENCOUNTER — Ambulatory Visit (INDEPENDENT_AMBULATORY_CARE_PROVIDER_SITE_OTHER): Payer: BC Managed Care – PPO | Admitting: Family Medicine

## 2012-05-09 ENCOUNTER — Encounter: Payer: Self-pay | Admitting: Family Medicine

## 2012-05-09 VITALS — BP 150/90 | HR 76 | Temp 98.4°F | Wt 184.0 lb

## 2012-05-09 DIAGNOSIS — F411 Generalized anxiety disorder: Secondary | ICD-10-CM

## 2012-05-09 DIAGNOSIS — Z87440 Personal history of urinary (tract) infections: Secondary | ICD-10-CM

## 2012-05-09 MED ORDER — BUSPIRONE HCL 15 MG PO TABS: 7.5000 mg | ORAL_TABLET | Freq: Three times a day (TID) | ORAL | Status: DC

## 2012-05-09 NOTE — Addendum Note (Signed)
Addended by: Dianne Dun on: 05/09/2012 11:40 AM   Modules accepted: Level of Service

## 2012-05-09 NOTE — Progress Notes (Signed)
Subjective:    Patient ID: Hannah Rocha, female    DOB: 09/07/1965, 47 y.o.   MRN: [id number]  HPI  47 yo here for one month follow up.    Has been under significant stress lately.  Her mom died 5 months ago.  Since then, she feels anxious, very tearful. She feels she is having panic attacks- her face gets flushed and her heart races when she gets anxious. Tried Zoloft in past.  Did not like how it made her feel- did not "feel like herself."  Started Buspar 7.5 mg twice daily last month.   She already feels much better.  Less anxious, less tearful.  Mid day, sometimes she feels a little flushed and anxious.   She is seeing Dr. Laymond Purser and she feels this has been very helpful.  She is seeing her again next week.  No HA, blurred vision, CP or SOB.  Denies any SI or HI.  Patient Active Problem List  Diagnosis  . ANEMIA-IRON DEFICIENCY  . ADJUSTMENT DISORDER WITH MIXED FEATURES  . Esophageal Reflux  . HIATAL HERNIA  . IBS  . MENOPAUSAL SYNDROME  . ATRIAL SEPTAL DEFECT, SECUNDUM TYPE  . 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  . IRON DEFICIENCY ANEMIA, HX OF  . CARDIAC MURMUR, HX OF  . Anemia  . GERD (gastroesophageal reflux disease)  . Menorrhagia  . Elevated blood pressure  . Anxiety   Past Medical History  Diagnosis Date  . Anemia   . Allergy   . GERD (gastroesophageal reflux disease)   . Obesity   . PONV (postoperative nausea and vomiting)   . Hiatal hernia 07/17/2009    EGD    Past Surgical History  Procedure Laterality Date  . Asd repair    . Mandible fracture surgery    . Cleft lip repair    . Laparoscopic tubal ligation  03/21/2012    Procedure: LAPAROSCOPIC TUBAL LIGATION;  Surgeon: Allie Bossier, MD;  Location: [facility];  Service: Gynecology;  Laterality: Bilateral;  . Tubal ligation     History  Substance Use Topics  . Smoking status: Former Smoker    Quit date: 02/22/1987  . Smokeless tobacco: Never Used     Comment: 22 years ago-light smoker   . Alcohol Use: Yes     Comment: occasional   Family History  Problem Relation Age of Onset  . Heart disease Mother   . Colon polyps Mother   . Prostate cancer Father   . Heart disease Father   . Colon cancer Neg Hx    Allergies  Allergen Reactions  . Sertraline Hcl     REACTION: vomiting, severe anxiety   Current Outpatient Prescriptions on File Prior to Visit  Medication Sig Dispense Refill  . busPIRone (BUSPAR) 15 MG tablet Take 0.5 tablets (7.5 mg total) by mouth 2 (two) times daily.  60 tablet  3  . flintstones complete (FLINTSTONES) 60 MG chewable tablet Chew 1 tablet by mouth daily.      . fluticasone (FLONASE) 50 MCG/ACT nasal spray Place 2 sprays into the nose daily.  16 g  2  . ibuprofen (ADVIL,MOTRIN) 800 MG tablet Take 1 tablet (800 mg total) by mouth every 8 (eight) hours as needed for pain.  60 tablet  1  . loratadine (ALAVERT) 10 MG tablet Take 10 mg by mouth daily.        . pantoprazole (PROTONIX) 40 MG tablet Take 1 tablet (40 mg total) by mouth daily.  30 tablet  7   No current facility-administered medications on file prior to visit.   The PMH, PSH, Social History, Family History, Medications, and allergies have been reviewed in [facility], and have been updated if relevant.   Review of Systems    See HPI Objective:   Physical Exam BP 150/90  Pulse 76  Temp(Src) 98.4 F (36.9 C)  Wt 184 lb (83.462 kg)  BMI 31.57 kg/m2 Wt Readings from Last 3 Encounters:  05/09/12 184 lb (83.462 kg)  04/04/12 182 lb (82.555 kg)  03/25/12 192 lb (87.091 kg)    Gen:  Alert, very tearful and anxious Resp:  CTA bilaterally CVS: RRR Psych:  Good eye contact, anxious     Assessment & Plan:   1. Anxiety  >15 min spent with face to face with patient, >50% counseling and/or coordinating care.  Increase Buspar to 7.5 mg three times daily.  Continue psychotherapy. Call or return to clinic prn if these symptoms worsen or fail to improve as anticipated. The patient indicates  understanding of these issues and agrees with the plan.

## 2012-05-09 NOTE — Patient Instructions (Addendum)
You look great.  We are increasing your Buspar to 7.5 mg three times daily.  Keep your appointment with Dr. Laymond Purser.

## 2012-05-11 ENCOUNTER — Ambulatory Visit (INDEPENDENT_AMBULATORY_CARE_PROVIDER_SITE_OTHER): Payer: BC Managed Care – PPO | Admitting: Psychology

## 2012-05-11 DIAGNOSIS — F411 Generalized anxiety disorder: Secondary | ICD-10-CM

## 2012-05-13 ENCOUNTER — Other Ambulatory Visit: Payer: Self-pay | Admitting: Family Medicine

## 2012-06-01 ENCOUNTER — Ambulatory Visit (INDEPENDENT_AMBULATORY_CARE_PROVIDER_SITE_OTHER): Payer: BC Managed Care – PPO | Admitting: Psychology

## 2012-06-01 DIAGNOSIS — F411 Generalized anxiety disorder: Secondary | ICD-10-CM

## 2012-06-10 ENCOUNTER — Ambulatory Visit (INDEPENDENT_AMBULATORY_CARE_PROVIDER_SITE_OTHER): Payer: BC Managed Care – PPO | Admitting: Family Medicine

## 2012-06-10 ENCOUNTER — Encounter: Payer: Self-pay | Admitting: Family Medicine

## 2012-06-10 VITALS — BP 118/72 | HR 95 | Temp 98.0°F | Wt 181.2 lb

## 2012-06-10 DIAGNOSIS — B354 Tinea corporis: Secondary | ICD-10-CM

## 2012-06-10 MED ORDER — CLOTRIMAZOLE-BETAMETHASONE 1-0.05 % EX CREA: TOPICAL_CREAM | Freq: Two times a day (BID) | CUTANEOUS | Status: DC

## 2012-06-10 MED ORDER — BUSPIRONE HCL 15 MG PO TABS: 15.0000 mg | ORAL_TABLET | Freq: Two times a day (BID) | ORAL | Status: DC

## 2012-06-10 NOTE — Progress Notes (Signed)
Subjective:    Patient ID: Hannah Rocha, female    DOB: 08-Feb-1966, 47 y.o.   MRN: [id number]  HPI  Very pleasant female here for rash on her left arm x 2 weeks.  It is itchy.  Not responding to OTC hydrocortisone or lotion.  No one has similar lesions.  Does not have pets but was around her friend's dogs.  Patient Active Problem List  Diagnosis  . ANEMIA-IRON DEFICIENCY  . ADJUSTMENT DISORDER WITH MIXED FEATURES  . Esophageal Reflux  . HIATAL HERNIA  . IBS  . MENOPAUSAL SYNDROME  . ATRIAL SEPTAL DEFECT, SECUNDUM TYPE  . 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  . IRON DEFICIENCY ANEMIA, HX OF  . CARDIAC MURMUR, HX OF  . Anemia  . GERD (gastroesophageal reflux disease)  . Menorrhagia  . Elevated blood pressure  . Anxiety   Past Medical History  Diagnosis Date  . Anemia   . Allergy   . GERD (gastroesophageal reflux disease)   . Obesity   . PONV (postoperative nausea and vomiting)   . Hiatal hernia 07/17/2009    EGD    Past Surgical History  Procedure Laterality Date  . Asd repair    . Mandible fracture surgery    . Cleft lip repair    . Laparoscopic tubal ligation  03/21/2012    Procedure: LAPAROSCOPIC TUBAL LIGATION;  Surgeon: Allie Bossier, MD;  Location: [facility];  Service: Gynecology;  Laterality: Bilateral;  . Tubal ligation     History  Substance Use Topics  . Smoking status: Former Smoker    Quit date: 02/22/1987  . Smokeless tobacco: Never Used     Comment: 22 years ago-light smoker  . Alcohol Use: Yes     Comment: occasional   Family History  Problem Relation Age of Onset  . Heart disease Mother   . Colon polyps Mother   . Prostate cancer Father   . Heart disease Father   . Colon cancer Neg Hx    Allergies  Allergen Reactions  . Sertraline Hcl     REACTION: vomiting, severe anxiety   Current Outpatient Prescriptions on File Prior to Visit  Medication Sig Dispense Refill  . flintstones complete (FLINTSTONES) 60 MG chewable tablet  Chew 1 tablet by mouth daily.      . fluticasone (FLONASE) 50 MCG/ACT nasal spray Place 2 sprays into the nose daily.  16 g  2  . fluticasone (FLONASE) 50 MCG/ACT nasal spray USE 2 SPRAYS IN EACH NOSTRIL DAILY  16 g  2  . ibuprofen (ADVIL,MOTRIN) 800 MG tablet Take 1 tablet (800 mg total) by mouth every 8 (eight) hours as needed for pain.  60 tablet  1  . loratadine (ALAVERT) 10 MG tablet Take 10 mg by mouth daily.        . pantoprazole (PROTONIX) 40 MG tablet Take 1 tablet (40 mg total) by mouth daily.  30 tablet  7   No current facility-administered medications on file prior to visit.   The PMH, PSH, Social History, Family History, Medications, and allergies have been reviewed in [facility], and have been updated if relevant.   Review of Systems No fevers, chills or other systemic symptoms    Objective:   Physical Exam  Constitutional: She appears well-developed and well-nourished. No distress.  Skin: Skin is warm, dry and intact. Lesion noted. No ecchymosis and no laceration noted.      BP 118/72  Pulse 95  Temp(Src) 98 F (36.7 C) (Oral)  Wt 181 lb 4 oz (82.214 kg)  BMI 31.1 kg/m2  SpO2 98%  LMP 05/20/2012  Wt Readings from Last 3 Encounters:  06/10/12 181 lb 4 oz (82.214 kg)  05/09/12 184 lb (83.462 kg)  04/04/12 182 lb (82.555 kg)         Assessment & Plan:  1. Ringworm of body New.  Discussed course and tx of tinea.  See AVS for details. Treat with lotrisone twice daily x 10 days. Call or return to clinic prn if these symptoms worsen or fail to improve as anticipated. The patient indicates understanding of these issues and agrees with the plan.

## 2012-06-10 NOTE — Patient Instructions (Addendum)
Good to see you. Please apply lotrisone to area twice daily until rash resolves.  If not better in 10 days, please call me.  Ringworm, Body [Tinea Corporis] Ringworm is a fungal infection of the skin and hair. Another name for this problem is Tinea Corporis. It has nothing to do with worms. A fungus is an organism that lives on dead cells (the outer layer of skin). It can involve the entire body. It can spread from infected pets. Tinea corporis can be a problem in wrestlers who may get the infection form other players/opponents, equipment and mats. DIAGNOSIS  A skin scraping can be obtained from the affected area and by looking for fungus under the microscope. This is called a KOH examination.  HOME CARE INSTRUCTIONS   Ringworm may be treated with a topical antifungal cream, ointment, or oral medications.  If you are using a cream or ointment, wash infected skin. Dry it completely before application.  Scrub the skin with a buff puff or abrasive sponge using a shampoo with ketoconazole to remove dead skin and help treat the ringworm.  Have your pet treated by your veterinarian if it has the same infection. SEEK MEDICAL CARE IF:   Your ringworm patch (fungus) continues to spread after 7 days of treatment.  Your rash is not gone in 4 weeks. Fungal infections are slow to respond to treatment. Some redness (erythema) may remain for several weeks after the fungus is gone.  The area becomes red, warm, tender, and swollen beyond the patch. This may be a secondary bacterial (germ) infection.  You have a fever. Document Released: 03/06/2000 Document Revised: 06/01/2011 Document Reviewed: 08/17/2008 [facility] Patient Information 2013 [place], [place].

## 2012-06-14 ENCOUNTER — Ambulatory Visit (INDEPENDENT_AMBULATORY_CARE_PROVIDER_SITE_OTHER): Payer: BC Managed Care – PPO | Admitting: Psychology

## 2012-06-14 DIAGNOSIS — F411 Generalized anxiety disorder: Secondary | ICD-10-CM

## 2012-06-22 ENCOUNTER — Other Ambulatory Visit: Payer: Self-pay | Admitting: Gastroenterology

## 2012-07-25 ENCOUNTER — Encounter: Payer: Self-pay | Admitting: Family Medicine

## 2012-07-25 ENCOUNTER — Ambulatory Visit (INDEPENDENT_AMBULATORY_CARE_PROVIDER_SITE_OTHER): Payer: BC Managed Care – PPO | Admitting: Family Medicine

## 2012-07-25 VITALS — BP 140/82 | HR 68 | Temp 98.7°F | Wt 179.0 lb

## 2012-07-25 DIAGNOSIS — H9209 Otalgia, unspecified ear: Secondary | ICD-10-CM

## 2012-07-25 DIAGNOSIS — H9201 Otalgia, right ear: Secondary | ICD-10-CM | POA: Insufficient documentation

## 2012-07-25 NOTE — Patient Instructions (Addendum)
Keep taking your allergy medication. Your ear is not infected and there is no wax. Take Sudafed (as directed on the box) for 48 hours- ask pharmacist for it. Call me in no improvement by then.

## 2012-07-25 NOTE — Progress Notes (Signed)
Subjective:    Patient ID: Hannah Rocha, female    DOB: 12-18-1965, 47 y.o.   MRN: [id number]  HPI  47 yo female here for right ear itching and "feels clogged" x 3 days.  She has had increased symptoms of allergic rhinitis- runny nose, sneezing, etc. No fevers or chills. No cough.  No ear pain. No drainage from her ear.  She is taking Alavert regularly.  Patient Active Problem List   Diagnosis Date Noted  . Right ear pain 07/25/2012  . Ringworm of body 06/10/2012  . Elevated blood pressure 04/04/2012  . Anxiety 04/04/2012  . Anemia 01/23/2011  . GERD (gastroesophageal reflux disease) 01/23/2011  . Menorrhagia 01/23/2011  . 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 MENOPAUSAL SYNDROME 01/15/2010  . ANEMIA-IRON DEFICIENCY 06/21/2009  . IBS 06/21/2009  . IRON DEFICIENCY ANEMIA, HX OF 07/10/2008  . HIATAL HERNIA 07/09/2008  . Esophageal Reflux 05/03/2007  . ATRIAL SEPTAL DEFECT, SECUNDUM TYPE 05/03/2007  . 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  . CARDIAC MURMUR, HX OF 05/03/2007   Past Medical History  Diagnosis Date  . Anemia   . Allergy   . GERD (gastroesophageal reflux disease)   . Obesity   . PONV (postoperative nausea and vomiting)   . Hiatal hernia 07/17/2009    EGD    Past Surgical History  Procedure Laterality Date  . Asd repair    . Mandible fracture surgery    . Cleft lip repair    . Laparoscopic tubal ligation  03/21/2012    Procedure: LAPAROSCOPIC TUBAL LIGATION;  Surgeon: Allie Bossier, MD;  Location: [facility];  Service: Gynecology;  Laterality: Bilateral;  . Tubal ligation     History  Substance Use Topics  . Smoking status: Former Smoker    Quit date: 02/22/1987  . Smokeless tobacco: Never Used     Comment: 22 years ago-light smoker  . Alcohol Use: Yes     Comment: occasional   Family History  Problem Relation Age of Onset  . Heart disease Mother   . Colon polyps Mother   . Prostate cancer Father   . Heart  disease Father   . Colon cancer Neg Hx    Allergies  Allergen Reactions  . Sertraline Hcl     REACTION: vomiting, severe anxiety   Current Outpatient Prescriptions on File Prior to Visit  Medication Sig Dispense Refill  . busPIRone (BUSPAR) 15 MG tablet Take 1 tablet (15 mg total) by mouth 2 (two) times daily.  90 tablet  3  . clotrimazole-betamethasone (LOTRISONE) cream Apply topically 2 (two) times daily.  30 g  0  . flintstones complete (FLINTSTONES) 60 MG chewable tablet Chew 1 tablet by mouth daily.      . fluticasone (FLONASE) 50 MCG/ACT nasal spray USE 2 SPRAYS IN EACH NOSTRIL DAILY  16 g  2  . ibuprofen (ADVIL,MOTRIN) 800 MG tablet Take 1 tablet (800 mg total) by mouth every 8 (eight) hours as needed for pain.  60 tablet  1  . loratadine (ALAVERT) 10 MG tablet Take 10 mg by mouth daily.        . pantoprazole (PROTONIX) 40 MG tablet 1 EACH DAY 30 MINUTES BEFORE MEAL  30 tablet  5   No current facility-administered medications on file prior to visit.   The PMH, PSH, Social History, Family History, Medications, and allergies have been reviewed in [facility], and have been updated if relevant.   Review of Systems  See HPI Objective:   Physical Exam  Constitutional: She appears well-developed and well-nourished. No distress.  HENT:  Head: Normocephalic and atraumatic.  Right Ear: Hearing normal. No lacerations. There is drainage. No tenderness. No foreign bodies. No mastoid tenderness. Tympanic membrane is not injected, not scarred, not perforated, not erythematous, not retracted and not bulging. Tympanic membrane mobility is normal. No middle ear effusion. No hemotympanum. No decreased hearing is noted.  Left Ear: Hearing, tympanic membrane, external ear and ear canal normal.  Nose: Nose normal.  Mouth/Throat: Oropharynx is clear and moist.  Skin: Skin is warm, dry and intact.  Psychiatric: She has a normal mood and affect. Her speech is normal and behavior is normal. Judgment and  thought content normal. Cognition and memory are normal.   BP 140/82  Pulse 68  Temp(Src) 98.7 F (37.1 C)  Wt 179 lb (81.194 kg)  BMI 30.71 kg/m2        Assessment & Plan:  1. Right ear pain Mild effusion, otherwise clear. Advised sudafed for next 48 hours.  Continue Alavert. Call or return to clinic prn if these symptoms worsen or fail to improve as anticipated. The patient indicates understanding of these issues and agrees with the plan.

## 2012-08-17 ENCOUNTER — Other Ambulatory Visit: Payer: Self-pay | Admitting: Family Medicine

## 2012-08-29 ENCOUNTER — Ambulatory Visit (INDEPENDENT_AMBULATORY_CARE_PROVIDER_SITE_OTHER): Payer: BC Managed Care – PPO | Admitting: Obstetrics & Gynecology

## 2012-08-29 ENCOUNTER — Encounter: Payer: Self-pay | Admitting: Obstetrics & Gynecology

## 2012-08-29 VITALS — BP 153/85 | HR 79 | Resp 16 | Ht 63.0 in | Wt 179.0 lb

## 2012-08-29 DIAGNOSIS — N898 Other specified noninflammatory disorders of vagina: Secondary | ICD-10-CM

## 2012-08-29 DIAGNOSIS — N390 Urinary tract infection, site not specified: Secondary | ICD-10-CM

## 2012-08-29 DIAGNOSIS — B379 Candidiasis, unspecified: Secondary | ICD-10-CM

## 2012-08-29 MED ORDER — CIPROFLOXACIN HCL 500 MG PO TABS: 500.0000 mg | ORAL_TABLET | Freq: Two times a day (BID) | ORAL | Status: DC

## 2012-08-29 NOTE — Patient Instructions (Signed)
Return to clinic for any scheduled appointments or for any gynecologic concerns as needed.   

## 2012-08-29 NOTE — Progress Notes (Signed)
GYNECOLOGY CLINIC PROGRESS NOTE  History:  47 y.o. G1P0010 here today for vaginal itching x 4 days, yellow discharge.  No other concerns.  The following portions of the patient's history were reviewed and updated as appropriate: allergies, current medications, past family history, past medical history, past social history, past surgical history and problem list. 02/22/12 pap smear was normal, neg HPV.  Review of Systems:  Pertinent items are noted in HPI.  Objective:  Physical Exam BP 153/85  Pulse 79  Resp 16  Ht 5\' 3"  (1.6 m)  Wt 179 lb (81.194 kg)  BMI 31.72 kg/m2  LMP 08/08/2012 Gen: NAD Abd: Soft, nontender and nondistended. No suprapubic tenderness. Pelvic: Atrophic external genitalia; vaginal mucosa and cervix.  Normal discharge.  Small uterus, no other palpable masses, no uterine or adnexal tenderness.  Urine dip: Cloudy with nitrites, will send for urine culture Assessment & Plan:  Presumptively treated with Ciprofloxacin; will follow up urine culture.  Follow up wet prep and manage accordingly.

## 2012-08-31 LAB — WET PREP, GENITAL

## 2012-08-31 MED ORDER — FLUCONAZOLE 150 MG PO TABS: ORAL_TABLET | ORAL | Status: DC

## 2012-08-31 NOTE — Addendum Note (Signed)
Addended by: Barbara Cower on: 08/31/2012 04:17 PM   Modules accepted: Orders

## 2012-10-12 ENCOUNTER — Ambulatory Visit: Payer: Self-pay

## 2012-10-15 ENCOUNTER — Other Ambulatory Visit: Payer: Self-pay | Admitting: Gastroenterology

## 2012-10-28 ENCOUNTER — Ambulatory Visit (INDEPENDENT_AMBULATORY_CARE_PROVIDER_SITE_OTHER): Payer: BC Managed Care – PPO | Admitting: Gastroenterology

## 2012-10-28 ENCOUNTER — Other Ambulatory Visit (INDEPENDENT_AMBULATORY_CARE_PROVIDER_SITE_OTHER): Payer: BC Managed Care – PPO

## 2012-10-28 ENCOUNTER — Encounter: Payer: Self-pay | Admitting: Gastroenterology

## 2012-10-28 VITALS — BP 122/72 | HR 80 | Ht 64.5 in | Wt 180.2 lb

## 2012-10-28 DIAGNOSIS — D649 Anemia, unspecified: Secondary | ICD-10-CM

## 2012-10-28 DIAGNOSIS — K219 Gastro-esophageal reflux disease without esophagitis: Secondary | ICD-10-CM

## 2012-10-28 LAB — CBC WITH DIFFERENTIAL/PLATELET
Basophils Absolute: 0 10*3/uL (ref 0.0–0.1)
Eosinophils Absolute: 0.1 10*3/uL (ref 0.0–0.7)
HCT: 39 % (ref 36.0–46.0)
Lymphs Abs: 1.8 10*3/uL (ref 0.7–4.0)
MCV: 92.3 fl (ref 78.0–100.0)
Monocytes Absolute: 0.6 10*3/uL (ref 0.1–1.0)
Monocytes Relative: 8.3 % (ref 3.0–12.0)
Platelets: 186 10*3/uL (ref 150.0–400.0)
RDW: 12.7 % (ref 11.5–14.6)

## 2012-10-28 LAB — FERRITIN: Ferritin: 76.3 ng/mL (ref 10.0–291.0)

## 2012-10-28 MED ORDER — PANTOPRAZOLE SODIUM 40 MG PO TBEC: 40.0000 mg | DELAYED_RELEASE_TABLET | Freq: Every day | ORAL | Status: DC

## 2012-10-28 NOTE — Patient Instructions (Signed)
Your physician has requested that you go to the basement for the following lab work before leaving today: Anemia Panel CBC  New prescription for Protonix was sent to your pharmacy  Please follow up with Dr. Jarold Motto in one year

## 2012-10-28 NOTE — Progress Notes (Signed)
History of Present Illness: This is a 47 year old Caucasian female with chronic GERD doing well on daily Protonix 40 mg.  She denies any reflux symptoms or other gastrointestinal problems.  She has heavy menorrhagia and apparently has a history of chronic iron deficiency anemia although I cannot document this on lab review.  She also is to check her CBC and iron studies today because of some chronic fatigue.  Patient is up-to-date on her endoscopy exams.  She specifically denies dysphagia, burning substernal chest pain, or any hepatobiliary or lower gastrointestinal problems.  Her appetite is good her weight is stable.    Current Medications, Allergies, Past Medical History, Past Surgical History, Family History and Social History were reviewed in [employer] record.  ROS: All systems were reviewed and are negative unless otherwise stated in the HPI.         Assessment and plan: Healthy-appearing patient in no distress.  Blood pressure 122/72, pulse 80 and regular and weight 180 pounds.  General physical exam not performed.  Her GERD seems to be doing well, and I've renewed her Protonix.  We'll check CBC and anemia profile at her request.  I suggested to her that she seek followup gynecologic evaluation because of her continued menorrhagia.  Please copy her primary care physician, referring physician, and pertinent subspecialists.

## 2012-11-03 ENCOUNTER — Other Ambulatory Visit: Payer: Self-pay | Admitting: Family Medicine

## 2012-11-30 ENCOUNTER — Ambulatory Visit (INDEPENDENT_AMBULATORY_CARE_PROVIDER_SITE_OTHER): Payer: BC Managed Care – PPO | Admitting: Family Medicine

## 2012-11-30 ENCOUNTER — Encounter: Payer: Self-pay | Admitting: Family Medicine

## 2012-11-30 VITALS — BP 138/82 | HR 76 | Temp 98.9°F | Wt 181.0 lb

## 2012-11-30 DIAGNOSIS — J069 Acute upper respiratory infection, unspecified: Secondary | ICD-10-CM

## 2012-11-30 HISTORY — DX: Acute upper respiratory infection, unspecified: J06.9

## 2012-11-30 NOTE — Patient Instructions (Addendum)
I think you have a viral upper respiratory infection with a cough  Try mucinex DM to help loosen the crud in chest and suppress cough  Tylenol is ok for pain or fever  For 2 days - use flonase twice daily and then go back to once daily (to open up your ear) Drink lots of fluids If congested - nasal saline spray is helpful  Update if not starting to improve in a week or if worsening   Make a nurse appointment to get a pneumonia vaccine after your vacation Do not forget your flu shot this season

## 2012-11-30 NOTE — Progress Notes (Signed)
Subjective:    Patient ID: Hannah Rocha, female    DOB: 09/04/1965, 47 y.o.   MRN: [id number]  HPI Here for uri symptoms  Sunday night started with a scratchy throat  No fever  Then post nasal drip and clearing throat   R ear hurts- no drainage from that    (tylenol helps)  Coughing up phlegm - at first was greenish / thick - with a funny taste  No sinus pain right now  Some pain on R side of neck   She uses flonase daily - forgot it for a few days   No other otc meds   Patient Active Problem List   Diagnosis Date Noted  . Right ear pain 07/25/2012  . Ringworm of body 06/10/2012  . Elevated blood pressure 04/04/2012  . Anxiety 04/04/2012  . Anemia 01/23/2011  . GERD (gastroesophageal reflux disease) 01/23/2011  . Menorrhagia 01/23/2011  . 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 MENOPAUSAL SYNDROME 01/15/2010  . ANEMIA-IRON DEFICIENCY 06/21/2009  . IBS 06/21/2009  . IRON DEFICIENCY ANEMIA, HX OF 07/10/2008  . HIATAL HERNIA 07/09/2008  . Esophageal Reflux 05/03/2007  . ATRIAL SEPTAL DEFECT, SECUNDUM TYPE 05/03/2007  . 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  . CARDIAC MURMUR, HX OF 05/03/2007   Past Medical History  Diagnosis Date  . Anemia   . Allergy   . GERD (gastroesophageal reflux disease)   . Obesity   . PONV (postoperative nausea and vomiting)   . Hiatal hernia 07/17/2009    EGD    Past Surgical History  Procedure Laterality Date  . Asd repair    . Mandible fracture surgery    . Cleft lip repair    . Laparoscopic tubal ligation  03/21/2012    Procedure: LAPAROSCOPIC TUBAL LIGATION;  Surgeon: Allie Bossier, MD;  Location: [facility];  Service: Gynecology;  Laterality: Bilateral;  . Tubal ligation     History  Substance Use Topics  . Smoking status: Former Smoker    Quit date: 02/22/1987  . Smokeless tobacco: Never Used     Comment: 22 years ago-light smoker  . Alcohol Use: Yes     Comment: occasional   Family History   Problem Relation Age of Onset  . Heart disease Mother   . Colon polyps Mother   . Prostate cancer Father   . Heart disease Father   . Colon cancer Neg Hx    Allergies  Allergen Reactions  . Sertraline Hcl     REACTION: vomiting, severe anxiety   Current Outpatient Prescriptions on File Prior to Visit  Medication Sig Dispense Refill  . busPIRone (BUSPAR) 15 MG tablet Take 1 tablet (15 mg total) by mouth 2 (two) times daily.  90 tablet  3  . flintstones complete (FLINTSTONES) 60 MG chewable tablet Chew 1 tablet by mouth daily.      . fluticasone (FLONASE) 50 MCG/ACT nasal spray USE 2 SPRAYS IN EACH NOSTRIL ONCE A DAY  16 g  4  . ibuprofen (ADVIL,MOTRIN) 800 MG tablet Take 1 tablet (800 mg total) by mouth every 8 (eight) hours as needed for pain.  60 tablet  1  . loratadine (ALAVERT) 10 MG tablet Take 10 mg by mouth daily.        . pantoprazole (PROTONIX) 40 MG tablet Take 1 tablet (40 mg total) by mouth daily.  30 tablet  11   No current facility-administered medications on file prior to visit.     Review  of Systems Review of Systems  Constitutional: Negative for fever, appetite change, fatigue and unexpected weight change.  ENT pos for cong and post nasal drip and ear pain  Eyes: Negative for pain and visual disturbance.  Respiratory: Negative for wheeze  and shortness of breath.   Cardiovascular: Negative for cp or palpitations    Gastrointestinal: Negative for nausea, diarrhea and constipation.  Genitourinary: Negative for urgency and frequency.  Skin: Negative for pallor or rash   Neurological: Negative for weakness, light-headedness, numbness and headaches.  Hematological: Negative for adenopathy. Does not bruise/bleed easily.  Psychiatric/Behavioral: Negative for dysphoric mood. The patient is not nervous/anxious.          Objective:   Physical Exam  Constitutional: She appears well-developed and well-nourished. No distress.  HENT:  Head: Normocephalic and  atraumatic.  Mouth/Throat: Oropharynx is clear and moist. No oropharyngeal exudate.  Nares are injected and congested  R TM is dull with effusion  L TM clear Post nasal drip  Eyes: Conjunctivae and EOM are normal. Pupils are equal, round, and reactive to light. Right eye exhibits no discharge. Left eye exhibits no discharge.  Neck: Normal range of motion. Neck supple.  Cardiovascular: Normal rate and regular rhythm.   Pulmonary/Chest: Effort normal and breath sounds normal. No respiratory distress. She has no wheezes. She has no rales.  Lymphadenopathy:    She has no cervical adenopathy.  Neurological: She is alert.  Skin: Skin is warm and dry. No rash noted.  Psychiatric: She has a normal mood and affect.          Assessment & Plan:

## 2012-12-01 NOTE — Assessment & Plan Note (Signed)
Reassuring exam Disc symptomatic care - see instructions on AVS Update if not starting to improve in a week or if worsening   Recommend a pneumovax in the future since she has a hx of pneumonia

## 2012-12-16 ENCOUNTER — Other Ambulatory Visit: Payer: Self-pay | Admitting: Family Medicine

## 2012-12-28 ENCOUNTER — Encounter: Payer: Self-pay | Admitting: Family Medicine

## 2012-12-28 ENCOUNTER — Ambulatory Visit (INDEPENDENT_AMBULATORY_CARE_PROVIDER_SITE_OTHER): Payer: BC Managed Care – PPO | Admitting: Family Medicine

## 2012-12-28 VITALS — BP 136/86 | HR 83 | Temp 97.8°F

## 2012-12-28 DIAGNOSIS — L237 Allergic contact dermatitis due to plants, except food: Secondary | ICD-10-CM

## 2012-12-28 DIAGNOSIS — L255 Unspecified contact dermatitis due to plants, except food: Secondary | ICD-10-CM

## 2012-12-28 MED ORDER — DEXAMETHASONE SODIUM PHOSPHATE 10 MG/ML IJ SOLN
10.0000 mg | Freq: Once | INTRAMUSCULAR | Status: AC
  Administered 2012-12-28: 10 mg via INTRAVENOUS

## 2012-12-28 MED ORDER — PREDNISONE 20 MG PO TABS: ORAL_TABLET | ORAL | Status: DC

## 2012-12-28 NOTE — Addendum Note (Signed)
Addended by: Criselda Peaches B on: 12/28/2012 09:59 AM   Modules accepted: Orders

## 2012-12-28 NOTE — Patient Instructions (Signed)
Good to see you. Please start taking Prednisone as directed in 3 days.  Finish course as prescribed.  Poison [facility] is an inflammation of the skin (contact dermatitis). It is caused by contact with the allergens on the leaves of the oak (toxicodendron) plants. Depending on your sensitivity, the rash may consist simply of redness and itching, or it may also progress to blisters which may break open (rupture). These must be well cared for to prevent secondary germ (bacterial) infection as these infections can lead to scarring. The eyes may also get puffy. The puffiness is worst in the morning and gets better as the day progresses. Healing is best accomplished by keeping any open areas dry, clean, covered with a bandage, and covered with an antibacterial ointment if needed. Without secondary infection, this dermatitis usually heals without scarring within 2 to 3 weeks without treatment. HOME CARE INSTRUCTIONS When you have been exposed to poison oak, it is very important to thoroughly wash with soap and water as soon as the exposure has been discovered. You have about one half hour to remove the plant resin before it will cause the rash. This cleaning will quickly destroy the oil or antigen on the skin (the antigen is what causes the rash). Wash aggressively under the fingernails as any plant resin still there will continue to spread the rash. Do not rub skin vigorously when washing affected area. Poison oak cannot spread if no oil from the plant remains on your body. Rash that has progressed to weeping sores (lesions) will not spread the rash unless you have not washed thoroughly. It is also important to clean any clothes you have been wearing as they may carry active allergens which will spread the rash, even several days later. Avoidance of the plant in the future is the best measure. Poison oak plants can be recognized by the number of leaves. Generally, poison oak has three leaves with flowering  branches on a single stem. Diphenhydramine may be purchased over the counter and used as needed for itching. Do not drive with this medication if it makes you drowsy. Ask your caregiver about medication for children. SEEK IMMEDIATE MEDICAL CARE IF:   Open areas of the rash develop.  You notice redness extending beyond the area of the rash.  There is a pus like discharge.  There is increased pain.  Other signs of infection develop (such as fever). Document Released: 09/13/2002 Document Revised: 06/01/2011 Document Reviewed: 01/23/2009 [facility] Patient Information 2014 [place], [place].

## 2012-12-28 NOTE — Progress Notes (Signed)
Subjective:    Patient ID: Hannah Rocha, female    DOB: 1965/12/02, 47 y.o.   MRN: [id number]  HPI  Very pleasant 47 yo female here for ? Poison oak.  Was out in the yard two days ago, next day, itchy linear rash on chin.  Now she has it on forehead, cheeks and right arm.  Very itchy.    No fevers, chills or drainage.  Patient Active Problem List   Diagnosis Date Noted  . Plant allergic contact dermatitis 12/28/2012  . Viral URI with cough 11/30/2012  . Right ear pain 07/25/2012  . Ringworm of body 06/10/2012  . Elevated blood pressure 04/04/2012  . Anxiety 04/04/2012  . Anemia 01/23/2011  . GERD (gastroesophageal reflux disease) 01/23/2011  . Menorrhagia 01/23/2011  . 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 MENOPAUSAL SYNDROME 01/15/2010  . ANEMIA-IRON DEFICIENCY 06/21/2009  . IBS 06/21/2009  . IRON DEFICIENCY ANEMIA, HX OF 07/10/2008  . HIATAL HERNIA 07/09/2008  . Esophageal Reflux 05/03/2007  . ATRIAL SEPTAL DEFECT, SECUNDUM TYPE 05/03/2007  . 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  . CARDIAC MURMUR, HX OF 05/03/2007   Past Medical History  Diagnosis Date  . Anemia   . Allergy   . GERD (gastroesophageal reflux disease)   . Obesity   . PONV (postoperative nausea and vomiting)   . Hiatal hernia 07/17/2009    EGD    Past Surgical History  Procedure Laterality Date  . Asd repair    . Mandible fracture surgery    . Cleft lip repair    . Laparoscopic tubal ligation  03/21/2012    Procedure: LAPAROSCOPIC TUBAL LIGATION;  Surgeon: Allie Bossier, MD;  Location: [facility];  Service: Gynecology;  Laterality: Bilateral;  . Tubal ligation     History  Substance Use Topics  . Smoking status: Former Smoker    Quit date: 02/22/1987  . Smokeless tobacco: Never Used     Comment: 22 years ago-light smoker  . Alcohol Use: Yes     Comment: occasional   Family History  Problem Relation Age of Onset  . Heart disease Mother   . Colon polyps  Mother   . Prostate cancer Father   . Heart disease Father   . Colon cancer Neg Hx    Allergies  Allergen Reactions  . Sertraline Hcl     REACTION: vomiting, severe anxiety   Current Outpatient Prescriptions on File Prior to Visit  Medication Sig Dispense Refill  . busPIRone (BUSPAR) 15 MG tablet TAKE 1 TABLET (15 MG TOTAL) BY MOUTH 2 (TWO) TIMES DAILY.  60 tablet  1  . flintstones complete (FLINTSTONES) 60 MG chewable tablet Chew 1 tablet by mouth daily.      . fluticasone (FLONASE) 50 MCG/ACT nasal spray USE 2 SPRAYS IN EACH NOSTRIL ONCE A DAY  16 g  4  . ibuprofen (ADVIL,MOTRIN) 800 MG tablet Take 1 tablet (800 mg total) by mouth every 8 (eight) hours as needed for pain.  60 tablet  1  . loratadine (ALAVERT) 10 MG tablet Take 10 mg by mouth daily.        . pantoprazole (PROTONIX) 40 MG tablet Take 1 tablet (40 mg total) by mouth daily.  30 tablet  11   No current facility-administered medications on file prior to visit.   The PMH, PSH, Social History, Family History, Medications, and allergies have been reviewed in [facility], and have been updated if relevant.   Review of Systems See  HPI    Objective:   Physical Exam  Constitutional: She appears well-developed and well-nourished. She appears distressed.  HENT:  Head: Normocephalic.  Skin: Skin is warm and dry. Rash noted.  Linear raised lesions on right chin, forehead below right eye.  Psychiatric: She has a normal mood and affect. Her speech is normal and behavior is normal. Judgment and thought content normal. Cognition and memory are normal.   BP 136/86  Pulse 83  Temp(Src) 97.8 F (36.6 C) (Oral)  SpO2 97%  LMP 12/08/2012        Assessment & Plan:  1. Plant allergic contact dermatitis New- facial without eye involvement. IM decadron given in office, then she will start oral prednisone taper in 72 hours. Antihistamines prn itching. See AVS. Call or return to clinic prn if these symptoms worsen or fail to improve  as anticipated. The patient indicates understanding of these issues and agrees with the plan.

## 2013-01-26 ENCOUNTER — Other Ambulatory Visit: Payer: Self-pay

## 2013-02-07 ENCOUNTER — Ambulatory Visit (INDEPENDENT_AMBULATORY_CARE_PROVIDER_SITE_OTHER): Payer: BC Managed Care – PPO | Admitting: Obstetrics & Gynecology

## 2013-02-07 ENCOUNTER — Encounter: Payer: Self-pay | Admitting: Obstetrics & Gynecology

## 2013-02-07 VITALS — BP 152/86 | HR 87 | Ht 64.0 in | Wt 180.0 lb

## 2013-02-07 DIAGNOSIS — N949 Unspecified condition associated with female genital organs and menstrual cycle: Secondary | ICD-10-CM

## 2013-02-07 DIAGNOSIS — N938 Other specified abnormal uterine and vaginal bleeding: Secondary | ICD-10-CM

## 2013-02-07 DIAGNOSIS — Z124 Encounter for screening for malignant neoplasm of cervix: Secondary | ICD-10-CM

## 2013-02-07 DIAGNOSIS — Z0000 Encounter for general adult medical examination without abnormal findings: Secondary | ICD-10-CM

## 2013-02-07 DIAGNOSIS — Z1151 Encounter for screening for human papillomavirus (HPV): Secondary | ICD-10-CM

## 2013-02-07 DIAGNOSIS — Z01419 Encounter for gynecological examination (general) (routine) without abnormal findings: Secondary | ICD-10-CM

## 2013-02-07 LAB — CBC
HCT: 36.3 % (ref 36.0–46.0)
Hemoglobin: 12.9 g/dL (ref 12.0–15.0)
MCH: 31.2 pg (ref 26.0–34.0)
MCV: 87.7 fL (ref 78.0–100.0)
RBC: 4.14 MIL/uL (ref 3.87–5.11)

## 2013-02-07 LAB — COMPREHENSIVE METABOLIC PANEL
CO2: 28 mEq/L (ref 19–32)
Calcium: 9.2 mg/dL (ref 8.4–10.5)
Creat: 0.75 mg/dL (ref 0.50–1.10)
Glucose, Bld: 100 mg/dL — ABNORMAL HIGH (ref 70–99)
Total Bilirubin: 0.4 mg/dL (ref 0.3–1.2)

## 2013-02-07 MED ORDER — MISOPROSTOL 200 MCG PO TABS: ORAL_TABLET | ORAL | Status: DC

## 2013-02-07 NOTE — Progress Notes (Signed)
Subjective:    Hannah Rocha is a 47 y.o. female who presents for an annual exam. She would like a hysterectomy because her periods are very painful, lasting 7 days with IMB. She has bled as long as 3 weeks. She also complains of some dyspareunia. She also complains of LBP. During her laparoscopy for BL salpingectomy a year ago, I noted 4 large (4cm) pedunculated fibroids. She has not had an u/s. The patient is sexually active. GYN screening history: last pap: was normal. The patient wears seatbelts: yes. The patient participates in regular exercise: yes. Has the patient ever been transfused or tattooed?: no. The patient reports that there is not domestic violence in her life.   Menstrual History: OB History   Grav Para Term Preterm Abortions TAB SAB Ect Mult Living   1 0 0 0 1 1 0 0 0 0       Menarche age: 20 Co[place]che: 62   Patient's last menstrual period was 01/29/2013.    The following portions of the patient's history were reviewed and updated as appropriate: allergies, current medications, past family history, past medical history, past social history, past surgical history and problem list.  Review of Systems A comprehensive review of systems was negative. She is an Therapist, occupational, monogamous for 25 years, married. She has had her flu vaccine and mammogram this year.   Objective:    BP 152/86  Pulse 87  Ht 5\' 4"  (1.626 m)  Wt 180 lb (81.647 kg)  BMI 30.88 kg/m2  LMP 01/29/2013  General Appearance:    Alert, cooperative, no distress, appears stated age  Head:    Normocephalic, without obvious abnormality, atraumatic  Eyes:    PERRL, conjunctiva/corneas clear, EOM's intact, fundi    benign, both eyes  Ears:    Normal TM's and external ear canals, both ears  Nose:   Nares normal, septum midline, mucosa normal, no drainage    or sinus tenderness  Throat:   Lips, mucosa, and tongue normal; teeth and gums normal  Neck:   Supple, symmetrical, trachea midline, no  adenopathy;    thyroid:  no enlargement/tenderness/nodules; no carotid   bruit or JVD  Back:     Symmetric, no curvature, ROM normal, no CVA tenderness  Lungs:     Clear to auscultation bilaterally, respirations unlabored  Chest Wall:    No tenderness or deformity   Heart:    Regular rate and rhythm, S1 and S2 normal, no murmur, rub   or gallop  Breast Exam:    No tenderness, masses, or nipple abnormality  Abdomen:     Soft, non-tender, bowel sounds active all four quadrants,    no masses, no organomegaly  Genitalia:    Normal female without lesion, discharge or tenderness, nulliparous cervix, very narrow pubic arch, no descensus of uterus, 8 week size uterus, mobile, NT, normal adnexal exam     Extremities:   Extremities normal, atraumatic, no cyanosis or edema  Pulses:   2+ and symmetric all extremities  Skin:   Skin color, texture, turgor normal, no rashes or lesions  Lymph nodes:   Cervical, supraclavicular, and axillary nodes normal  Neurologic:   CNII-XII intact, normal strength, sensation and reflexes    throughout  .    Assessment:    Healthy female exam.  DUB/pelvic pain/dyspareunia/fibroids   Plan:     Breast self exam technique reviewed and patient encouraged to perform self-exam monthly. Thin prep Pap smear. with cotesting Gyn u/s CBC, TSH RTC 1  month for [facility]. She will take cytotec orally the night prior

## 2013-02-08 LAB — TSH: TSH: 2.868 u[IU]/mL (ref 0.350–4.500)

## 2013-02-17 ENCOUNTER — Ambulatory Visit (HOSPITAL_COMMUNITY)
Admission: RE | Admit: 2013-02-17 | Discharge: 2013-02-17 | Disposition: A | Discharge status: Home / Self Care | Payer: BC Managed Care – PPO | Source: Ambulatory Visit | Attending: Obstetrics & Gynecology | Admitting: Obstetrics & Gynecology

## 2013-02-17 DIAGNOSIS — N938 Other specified abnormal uterine and vaginal bleeding: Secondary | ICD-10-CM

## 2013-02-17 DIAGNOSIS — N949 Unspecified condition associated with female genital organs and menstrual cycle: Secondary | ICD-10-CM | POA: Insufficient documentation

## 2013-02-17 DIAGNOSIS — D252 Subserosal leiomyoma of uterus: Secondary | ICD-10-CM | POA: Insufficient documentation

## 2013-02-18 ENCOUNTER — Other Ambulatory Visit: Payer: Self-pay | Admitting: Family Medicine

## 2013-02-24 ENCOUNTER — Ambulatory Visit (INDEPENDENT_AMBULATORY_CARE_PROVIDER_SITE_OTHER): Payer: BC Managed Care – PPO | Admitting: Obstetrics & Gynecology

## 2013-02-24 ENCOUNTER — Encounter: Payer: Self-pay | Admitting: Obstetrics & Gynecology

## 2013-02-24 VITALS — BP 140/85 | HR 84 | Ht 63.0 in | Wt 181.0 lb

## 2013-02-24 DIAGNOSIS — Z01812 Encounter for preprocedural laboratory examination: Secondary | ICD-10-CM

## 2013-02-24 DIAGNOSIS — N949 Unspecified condition associated with female genital organs and menstrual cycle: Secondary | ICD-10-CM

## 2013-02-24 DIAGNOSIS — N938 Other specified abnormal uterine and vaginal bleeding: Secondary | ICD-10-CM

## 2013-02-24 LAB — POCT URINE PREGNANCY: Preg Test, Ur: NEGATIVE

## 2013-02-24 NOTE — Progress Notes (Signed)
   Subjective:    Patient ID: Hannah Rocha, female    DOB: 07/22/1965, 47 y.o.   MRN: [id number]  HPI  This 47 yo nullipara is here today for her EMBX. She took cytotec last evening and IBU this morning.  Review of Systems     Objective:   Physical Exam   UPT negative, consent signed, time out done Cervix prepped with betadine and grasped with a single tooth tenaculum Uterus sounded to 9 cm Pipelle used for 2 passes with a moderate amount of tissue obtained. She tolerated the procedure well. She has a narrow pubic arch       Assessment & Plan:  DUB- await EMBX RTC 2 weeks

## 2013-03-10 ENCOUNTER — Encounter: Payer: Self-pay | Admitting: Family Medicine

## 2013-03-10 ENCOUNTER — Ambulatory Visit (INDEPENDENT_AMBULATORY_CARE_PROVIDER_SITE_OTHER): Payer: BC Managed Care – PPO | Admitting: Family Medicine

## 2013-03-10 VITALS — BP 140/86 | HR 89 | Ht 63.0 in | Wt 181.0 lb

## 2013-03-10 DIAGNOSIS — N920 Excessive and frequent menstruation with regular cycle: Secondary | ICD-10-CM

## 2013-03-10 NOTE — Patient Instructions (Signed)
Hysterectomy Information  A hysterectomy is a procedure where your uterus is surgically removed. It will no longer be possible to have menstrual periods or to become pregnant. The tubes and ovaries can be removed (bilateral salpingo-oopherectomy) during this surgery as well.  REASONS FOR A HYSTERECTOMY  Persistent, abnormal bleeding.  Lasting (chronic) pelvic pain or infection.  The lining of the uterus (endometrium) starts growing outside the uterus (endometriosis).  The endometrium starts growing in the muscle of the uterus (adenomyosis).  The uterus falls down into the vagina (pelvic organ prolapse).  Symptomatic uterine fibroids.  Precancerous cells.  Cervical cancer or uterine cancer. TYPES OF HYSTERECTOMIES  Supracervical hysterectomy. This type removes the top part of the uterus, but not the cervix.  Total hysterectomy. This type removes the uterus and cervix.  Radical hysterectomy. This type removes the uterus, cervix, and the fibrous tissue that holds the uterus in place in the pelvis (parametrium). WAYS A HYSTERECTOMY CAN BE PERFORMED  Abdominal hysterectomy. A large surgical cut (incision) is made in the abdomen. The uterus is removed through this incision.  Vaginal hysterectomy. An incision is made in the vagina. The uterus is removed through this incision. There are no abdominal incisions.  Conventional laparoscopic hysterectomy. A thin, lighted tube with a camera (laparoscope) is inserted into 3 or 4 small incisions in the abdomen. The uterus is cut into small pieces. The small pieces are removed through the incisions, or they are removed through the vagina.  Laparoscopic assisted vaginal hysterectomy (LAVH). Three or four small incisions are made in the abdomen. Part of the surgery is performed laparoscopically and part vaginally. The uterus is removed through the vagina.  Robot-assisted laparoscopic hysterectomy. A laparoscope is inserted into 3 or 4 small  incisions in the abdomen. A computer-controlled device is used to give the surgeon a 3D image. This allows for more precise movements of surgical instruments. The uterus is cut into small pieces and removed through the incisions or removed through the vagina. RISKS OF HYSTERECTOMY   Bleeding and risk of blood transfusion. Tell your caregiver if you do not want to receive any blood products.  Blood clots in the legs or lung.  Infection.  Injury to surrounding organs.  Anesthesia problems or side effects.  Conversion to an abdominal hysterectomy. WHAT TO EXPECT AFTER A HYSTERECTOMY  You will be given pain medicine.  You will need to have someone with you for the first 3 to 5 days after you go home.  You will need to follow up with your surgeon in 2 to 4 weeks after surgery to evaluate your progress.  You may have early menopause symptoms like hot flashes, night sweats, and insomnia.  If you had a hysterectomy for a problem that was not a cancer or a condition that could lead to cancer, then you no longer need Pap tests. However, even if you no longer need a Pap test, a regular exam is a good idea to make sure no other problems are starting. Document Released: 09/02/2000 Document Revised: 06/01/2011 Document Reviewed: 10/18/2010 [employer] Patient Information 2014 [employer].  

## 2013-03-10 NOTE — Progress Notes (Signed)
   Subjective:    Patient ID: Hannah Rocha, female    DOB: [birth year], 47 y.o.   MRN: [id number]  HPI Pt. Returns for discussion of results.  She has been having heavy vaginal bleeding with her cycles.  She has undergone EMB + pelvic sono, which showed fibroids and EMB showed benign path, possible polyp.   Review of Systems  Constitutional: Negative for fever and chills.  Respiratory: Negative for shortness of breath.   Cardiovascular: Negative for leg swelling.  Gastrointestinal: Negative for abdominal pain.  Genitourinary: Positive for menstrual problem.       Objective:   Physical Exam  Vitals reviewed. Constitutional: She is oriented to person, place, and time. She appears well-developed and well-nourished. No distress.  HENT:  Head: Normocephalic and atraumatic.  Neck: Neck supple.  Cardiovascular: Normal rate.   Pulmonary/Chest: Effort normal.  Abdominal: Soft. There is no tenderness.  Neurological: She is alert and oriented to person, place, and time.  Skin: Skin is warm. No rash noted.  Psychiatric: She has a normal mood and affect.          Assessment & Plan:

## 2013-03-10 NOTE — Assessment & Plan Note (Addendum)
Discussed results at length with pt. today. Pt. desires permanent treatment with hysterectomy.  Will post and have pt. F/u with primary MD to discuss mode of this procedure including risks/benefits.

## 2013-03-24 ENCOUNTER — Ambulatory Visit (INDEPENDENT_AMBULATORY_CARE_PROVIDER_SITE_OTHER): Payer: BC Managed Care – PPO | Admitting: Family Medicine

## 2013-03-24 ENCOUNTER — Encounter: Payer: Self-pay | Admitting: Family Medicine

## 2013-03-24 VITALS — BP 138/82 | HR 93 | Temp 98.2°F | Ht 64.5 in | Wt 184.5 lb

## 2013-03-24 DIAGNOSIS — L239 Allergic contact dermatitis, unspecified cause: Secondary | ICD-10-CM | POA: Insufficient documentation

## 2013-03-24 DIAGNOSIS — L259 Unspecified contact dermatitis, unspecified cause: Secondary | ICD-10-CM

## 2013-03-24 DIAGNOSIS — IMO0001 Reserved for inherently not codable concepts without codable children: Secondary | ICD-10-CM

## 2013-03-24 DIAGNOSIS — R030 Elevated blood-pressure reading, without diagnosis of hypertension: Secondary | ICD-10-CM

## 2013-03-24 MED ORDER — FLUOCINONIDE-E 0.05 % EX CREA: 1.0000 "application " | TOPICAL_CREAM | Freq: Two times a day (BID) | CUTANEOUS | Status: DC

## 2013-03-24 NOTE — Progress Notes (Signed)
Pre-visit discussion using our clinic review tool. No additional management support is needed unless otherwise documented below in the visit note.  

## 2013-03-24 NOTE — Patient Instructions (Signed)
Good to see you. Let's a try another type of deodorant like we talked about. Try Lidex as needed on the area (no more than 10 days in a row).

## 2013-03-24 NOTE — Assessment & Plan Note (Signed)
?   Due to to thicker deodorant. Advised going back to type she used in past or a more natural deodorant without antiperspirant. Lidex as needed (no more than 10 days without calling me). Call or return to clinic prn if these symptoms worsen or fail to improve as anticipated. The patient indicates understanding of these issues and agrees with the plan.

## 2013-03-24 NOTE — Assessment & Plan Note (Signed)
Elevated initially - 164/92.  Rechecked twice and was normotensive. She has known white coat HTN.

## 2013-03-24 NOTE — Progress Notes (Signed)
Subjective:    Patient ID: Hannah Rocha, female    DOB: [birth year], 48 y.o.   MRN: [id number]  HPI  Pleasant 48 yo here with intermittent rash under left axilla.  She has changed to secret deodorant from degree but states she had this rash intermittently, even when she used degree. She is using more of a "creamy" deodorant now. Rash is very itchy, raised. Currently does not have any lesions. No SOB or wheezing.  Patient Active Problem List   Diagnosis Date Noted  . Allergic dermatitis 03/24/2013  . Elevated blood pressure 04/04/2012  . Anxiety 04/04/2012  . GERD (gastroesophageal reflux disease) 01/23/2011  . 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 MENOPAUSAL SYNDROME 01/15/2010  . ANEMIA-IRON DEFICIENCY 06/21/2009  . IBS 06/21/2009  . IRON DEFICIENCY ANEMIA, HX OF 07/10/2008  . HIATAL HERNIA 07/09/2008  . Esophageal Reflux 05/03/2007  . ATRIAL SEPTAL DEFECT, SECUNDUM TYPE 05/03/2007  . 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  . CARDIAC MURMUR, HX OF 05/03/2007   Past Medical History  Diagnosis Date  . Anemia   . Allergy   . GERD (gastroesophageal reflux disease)   . Obesity   . PONV (postoperative nausea and vomiting)   . Hiatal hernia 07/17/2009    EGD    Past Surgical History  Procedure Laterality Date  . Asd repair    . Mandible fracture surgery    . Cleft lip repair    . Laparoscopic tubal ligation  03/21/2012    Procedure: LAPAROSCOPIC TUBAL LIGATION;  Surgeon: Emily Filbert, MD;  Location: [facility];  Service: Gynecology;  Laterality: Bilateral;  . Tubal ligation     History  Substance Use Topics  . Smoking status: Former Smoker    Quit date: 02/22/1987  . Smokeless tobacco: Never Used     Comment: 22 years ago-light smoker  . Alcohol Use: Yes     Comment: occasional   Family History  Problem Relation Age of Onset  . Heart disease Mother   . Colon polyps Mother   . Prostate cancer Father   . Heart disease Father     . Colon cancer Neg Hx    Allergies  Allergen Reactions  . Sertraline Hcl     REACTION: vomiting, severe anxiety   Current Outpatient Prescriptions on File Prior to Visit  Medication Sig Dispense Refill  . busPIRone (BUSPAR) 15 MG tablet TAKE 1 TABLET (15 MG TOTAL) BY MOUTH 2 (TWO) TIMES DAILY.  60 tablet  2  . flintstones complete (FLINTSTONES) 60 MG chewable tablet Chew 1 tablet by mouth daily.      . fluticasone (FLONASE) 50 MCG/ACT nasal spray USE 2 SPRAYS IN EACH NOSTRIL ONCE A DAY  16 g  4  . ibuprofen (ADVIL,MOTRIN) 800 MG tablet Take 1 tablet (800 mg total) by mouth every 8 (eight) hours as needed for pain.  60 tablet  1  . loratadine (ALAVERT) 10 MG tablet Take 10 mg by mouth daily.        . pantoprazole (PROTONIX) 40 MG tablet Take 1 tablet (40 mg total) by mouth daily.  30 tablet  11   No current facility-administered medications on file prior to visit.   The PMH, PSH, Social History, Family History, Medications, and allergies have been reviewed in [facility], and have been updated if relevant.   Review of Systems    See HPI Objective:   Physical Exam  Constitutional: She is oriented to person, place, and time. She  appears well-developed and well-nourished. No distress.  HENT:  Head: Normocephalic.  Neurological: She is alert and oriented to person, place, and time.  Skin: Skin is warm and dry. No rash noted. No erythema. No pallor.   BP 138/82  Pulse 93  Temp(Src) 98.2 F (36.8 C) (Oral)  Ht 5' 4.5" (1.638 m)  Wt 184 lb 8 oz (83.689 kg)  BMI 31.19 kg/m2  SpO2 97%  LMP 03/24/2013        Assessment & Plan:

## 2013-03-30 ENCOUNTER — Ambulatory Visit (INDEPENDENT_AMBULATORY_CARE_PROVIDER_SITE_OTHER): Payer: BC Managed Care – PPO | Admitting: Obstetrics & Gynecology

## 2013-03-30 ENCOUNTER — Encounter: Payer: Self-pay | Admitting: Obstetrics & Gynecology

## 2013-03-30 VITALS — BP 158/93 | HR 87 | Ht 64.0 in | Wt 181.0 lb

## 2013-03-30 DIAGNOSIS — N949 Unspecified condition associated with female genital organs and menstrual cycle: Secondary | ICD-10-CM

## 2013-03-30 DIAGNOSIS — D219 Benign neoplasm of connective and other soft tissue, unspecified: Secondary | ICD-10-CM

## 2013-03-30 DIAGNOSIS — N938 Other specified abnormal uterine and vaginal bleeding: Secondary | ICD-10-CM

## 2013-03-30 DIAGNOSIS — D259 Leiomyoma of uterus, unspecified: Secondary | ICD-10-CM

## 2013-03-30 DIAGNOSIS — N920 Excessive and frequent menstruation with regular cycle: Secondary | ICD-10-CM

## 2013-03-30 NOTE — Progress Notes (Signed)
   Subjective:    Patient ID: Hannah Rocha, female    DOB: April 07, 1965, 48 y.o.   MRN: [id number]  HPI 48 yo MW P0 is here for her pre op visit. All questions were answered. Risks and benefits of surgery were explained, understood, and accepted. I repeated a bimanual exam and have suggested that we change the surgery from a TVH to a LAVH (due to nulliparous status and lack of descensus. I will also plan to remove her oviducts to decrease future risk of ovarian cancer.   Review of Systems     Objective:   Physical Exam        Assessment & Plan:  As above

## 2013-03-31 ENCOUNTER — Encounter (HOSPITAL_COMMUNITY): Payer: Self-pay

## 2013-04-03 ENCOUNTER — Encounter: Payer: Self-pay | Admitting: Family Medicine

## 2013-04-03 ENCOUNTER — Ambulatory Visit (INDEPENDENT_AMBULATORY_CARE_PROVIDER_SITE_OTHER): Payer: BC Managed Care – PPO | Admitting: Family Medicine

## 2013-04-03 VITALS — BP 138/72 | HR 89 | Temp 98.3°F | Wt 183.0 lb

## 2013-04-03 DIAGNOSIS — J069 Acute upper respiratory infection, unspecified: Secondary | ICD-10-CM

## 2013-04-03 NOTE — Patient Instructions (Signed)
Great to see you. This is likely a virus.  Your lungs sound great. Keep taking Delsym and Mucinex D.  Call me if not feeling better by the end of the week.

## 2013-04-03 NOTE — Progress Notes (Signed)
Pre-visit discussion using our clinic review tool. No additional management support is needed unless otherwise documented below in the visit note.  

## 2013-04-03 NOTE — Progress Notes (Signed)
SUBJECTIVE:  Hannah Rocha is a 48 y.o. female who complains of coryza, congestion, sneezing and sore throat for 3 days. She denies a history of anorexia, chest pain, chills, dizziness, fatigue, fevers, myalgias, nausea and shortness of breath and denies a history of asthma. Patient denies smoke cigarettes.   Patient Active Problem List   Diagnosis Date Noted  . Elevated blood pressure 04/04/2012  . Anxiety 04/04/2012  . GERD (gastroesophageal reflux disease) 01/23/2011  . 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 MENOPAUSAL SYNDROME 01/15/2010  . ANEMIA-IRON DEFICIENCY 06/21/2009  . IBS 06/21/2009  . IRON DEFICIENCY ANEMIA, HX OF 07/10/2008  . HIATAL HERNIA 07/09/2008  . Esophageal Reflux 05/03/2007  . ATRIAL SEPTAL DEFECT, SECUNDUM TYPE 05/03/2007  . 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  . CARDIAC MURMUR, HX OF 05/03/2007   Past Medical History  Diagnosis Date  . Anemia   . Allergy   . GERD (gastroesophageal reflux disease)   . Obesity   . PONV (postoperative nausea and vomiting)   . Hiatal hernia 07/17/2009    EGD    Past Surgical History  Procedure Laterality Date  . Asd repair    . Mandible fracture surgery    . Cleft lip repair    . Laparoscopic tubal ligation  03/21/2012    Procedure: LAPAROSCOPIC TUBAL LIGATION;  Surgeon: Emily Filbert, MD;  Location: [facility];  Service: Gynecology;  Laterality: Bilateral;  . Tubal ligation     History  Substance Use Topics  . Smoking status: Former Smoker    Quit date: 02/22/1987  . Smokeless tobacco: Never Used     Comment: 22 years ago-light smoker  . Alcohol Use: Yes     Comment: occasional   Family History  Problem Relation Age of Onset  . Heart disease Mother   . Colon polyps Mother   . Prostate cancer Father   . Heart disease Father   . Colon cancer Neg Hx    Allergies  Allergen Reactions  . Sertraline Hcl     REACTION: vomiting, severe anxiety   Current Outpatient Prescriptions  on File Prior to Visit  Medication Sig Dispense Refill  . acetaminophen (TYLENOL) 500 MG tablet Take 1,000 mg by mouth daily as needed for mild pain. Takes about once per week.      . busPIRone (BUSPAR) 15 MG tablet TAKE 1 TABLET (15 MG TOTAL) BY MOUTH 2 (TWO) TIMES DAILY.  60 tablet  2  . flintstones complete (FLINTSTONES) 60 MG chewable tablet Chew 1 tablet by mouth daily.      . fluocinonide-emollient (LIDEX-E) 0.05 % cream Apply 1 application topically 2 (two) times daily.  30 g  0  . fluticasone (FLONASE) 50 MCG/ACT nasal spray USE 2 SPRAYS IN EACH NOSTRIL ONCE A DAY  16 g  4  . ibuprofen (ADVIL,MOTRIN) 800 MG tablet Take 1 tablet (800 mg total) by mouth every 8 (eight) hours as needed for pain.  60 tablet  1  . loratadine (ALAVERT) 10 MG tablet Take 10 mg by mouth daily.        . pantoprazole (PROTONIX) 40 MG tablet Take 1 tablet (40 mg total) by mouth daily.  30 tablet  11   No current facility-administered medications on file prior to visit.   The PMH, PSH, Social History, Family History, Medications, and allergies have been reviewed in [facility], and have been updated if relevant.  OBJECTIVE: BP 138/72  Pulse 89  Temp(Src) 98.3 F (36.8 C) (Oral)  Wt 183 lb (83.008 kg)  SpO2 96%  LMP 03/24/2013  She appears well, vital signs are as noted. Ears normal.  Throat and pharynx normal.  Neck supple. No adenopathy in the neck. Nose is congested. Sinuses non tender. The chest is clear, without wheezes or rales.  ASSESSMENT:  viral upper respiratory illness  PLAN: Symptomatic therapy suggested: push fluids, rest and return office visit prn if symptoms persist or worsen. Lack of antibiotic effectiveness discussed with her. Call or return to clinic prn if these symptoms worsen or fail to improve as anticipated.

## 2013-04-10 ENCOUNTER — Encounter (HOSPITAL_COMMUNITY)
Admission: RE | Admit: 2013-04-10 | Discharge: 2013-04-10 | Disposition: A | Discharge status: Home / Self Care | Payer: BC Managed Care – PPO | Source: Ambulatory Visit | Attending: Obstetrics & Gynecology | Admitting: Obstetrics & Gynecology

## 2013-04-10 ENCOUNTER — Encounter (HOSPITAL_COMMUNITY): Payer: Self-pay

## 2013-04-10 HISTORY — DX: Anxiety disorder, unspecified: F41.9

## 2013-04-10 HISTORY — DX: Personal history of other diseases of the digestive system: Z87.19

## 2013-04-10 LAB — CBC
HCT: 37 % (ref 36.0–46.0)
HEMOGLOBIN: 13.1 g/dL (ref 12.0–15.0)
MCH: 31 pg (ref 26.0–34.0)
MCHC: 35.4 g/dL (ref 30.0–36.0)
MCV: 87.7 fL (ref 78.0–100.0)
Platelets: 219 10*3/uL (ref 150–400)
RBC: 4.22 MIL/uL (ref 3.87–5.11)
RDW: 12.4 % (ref 11.5–15.5)
WBC: 8.9 10*3/uL (ref 4.0–10.5)

## 2013-04-10 NOTE — Patient Instructions (Addendum)
   Your procedure is scheduled on: Thursday, Jan 22  Enter through the Main Entrance of [facility] at: 8 AM Pick up the phone at the desk and dial [phone] and inform us of your arrival.  Please call this number if you have any problems the morning of surgery: [phone]  Remember: Do not eat or drink after midnight: Wednesday Take these medicines the morning of surgery with a SIP OF WATER:  Protonix, alavert, buspar  Do not wear jewelry, make-up, or FINGER nail polish No metal in your hair or on your body. Do not wear lotions, powders, perfumes.  You may wear deodorant.  Do not bring valuables to the hospital. Contacts, dentures or bridgework may not be worn into surgery.  Leave suitcase in the car. After Surgery it may be brought to your room. For patients being admitted to the hospital, checkout time is 11:00am the day of discharge.  Home with husband Hannah Rocha.

## 2013-04-11 ENCOUNTER — Encounter: Payer: Self-pay | Admitting: *Deleted

## 2013-04-13 ENCOUNTER — Ambulatory Visit (HOSPITAL_COMMUNITY)
Admission: RE | Admit: 2013-04-13 | Discharge: 2013-04-14 | Disposition: A | Discharge status: Home / Self Care | Payer: BC Managed Care – PPO | Source: Ambulatory Visit | Attending: Obstetrics & Gynecology | Admitting: Obstetrics & Gynecology

## 2013-04-13 ENCOUNTER — Encounter (HOSPITAL_COMMUNITY)
Admission: RE | Disposition: A | Discharge status: Home / Self Care | Payer: Self-pay | Source: Ambulatory Visit | Attending: Obstetrics & Gynecology

## 2013-04-13 ENCOUNTER — Encounter (HOSPITAL_COMMUNITY): Payer: BC Managed Care – PPO | Admitting: Anesthesiology

## 2013-04-13 ENCOUNTER — Ambulatory Visit (HOSPITAL_COMMUNITY): Payer: BC Managed Care – PPO | Admitting: Anesthesiology

## 2013-04-13 ENCOUNTER — Encounter (HOSPITAL_COMMUNITY): Payer: Self-pay | Admitting: Anesthesiology

## 2013-04-13 DIAGNOSIS — D251 Intramural leiomyoma of uterus: Secondary | ICD-10-CM | POA: Insufficient documentation

## 2013-04-13 DIAGNOSIS — N803 Endometriosis of pelvic peritoneum, unspecified: Secondary | ICD-10-CM | POA: Insufficient documentation

## 2013-04-13 DIAGNOSIS — N946 Dysmenorrhea, unspecified: Secondary | ICD-10-CM | POA: Insufficient documentation

## 2013-04-13 DIAGNOSIS — D259 Leiomyoma of uterus, unspecified: Secondary | ICD-10-CM

## 2013-04-13 DIAGNOSIS — N938 Other specified abnormal uterine and vaginal bleeding: Secondary | ICD-10-CM | POA: Insufficient documentation

## 2013-04-13 DIAGNOSIS — N949 Unspecified condition associated with female genital organs and menstrual cycle: Secondary | ICD-10-CM | POA: Insufficient documentation

## 2013-04-13 DIAGNOSIS — D252 Subserosal leiomyoma of uterus: Secondary | ICD-10-CM | POA: Insufficient documentation

## 2013-04-13 DIAGNOSIS — D219 Benign neoplasm of connective and other soft tissue, unspecified: Secondary | ICD-10-CM

## 2013-04-13 DIAGNOSIS — IMO0002 Reserved for concepts with insufficient information to code with codable children: Secondary | ICD-10-CM | POA: Insufficient documentation

## 2013-04-13 DIAGNOSIS — N925 Other specified irregular menstruation: Secondary | ICD-10-CM | POA: Insufficient documentation

## 2013-04-13 DIAGNOSIS — D649 Anemia, unspecified: Secondary | ICD-10-CM | POA: Insufficient documentation

## 2013-04-13 DIAGNOSIS — Z9889 Other specified postprocedural states: Secondary | ICD-10-CM

## 2013-04-13 HISTORY — PX: LAPAROSCOPIC ASSISTED VAGINAL HYSTERECTOMY: SHX5398

## 2013-04-13 LAB — PREGNANCY, URINE: Preg Test, Ur: NEGATIVE

## 2013-04-13 SURGERY — HYSTERECTOMY, VAGINAL, LAPAROSCOPY-ASSISTED
Anesthesia: General | Laterality: Bilateral

## 2013-04-13 MED ORDER — ZOLPIDEM TARTRATE 5 MG PO TABS: 5.0000 mg | ORAL_TABLET | Freq: Every evening | ORAL | Status: DC | PRN

## 2013-04-13 MED ORDER — HYDROMORPHONE HCL PF 1 MG/ML IJ SOLN
INTRAMUSCULAR | Status: AC
  Filled 2013-04-13: qty 1

## 2013-04-13 MED ORDER — METOCLOPRAMIDE HCL 5 MG/ML IJ SOLN
INTRAMUSCULAR | Status: AC
  Filled 2013-04-13: qty 2

## 2013-04-13 MED ORDER — MEPERIDINE HCL 25 MG/ML IJ SOLN
INTRAMUSCULAR | Status: AC
  Filled 2013-04-13: qty 1

## 2013-04-13 MED ORDER — METOCLOPRAMIDE HCL 5 MG/ML IJ SOLN
10.0000 mg | Freq: Once | INTRAMUSCULAR | Status: AC | PRN
  Administered 2013-04-13: 10 mg via INTRAVENOUS

## 2013-04-13 MED ORDER — HYDROMORPHONE HCL PF 1 MG/ML IJ SOLN
0.2500 mg | INTRAMUSCULAR | Status: DC | PRN
  Administered 2013-04-13: 0.5 mg via INTRAVENOUS

## 2013-04-13 MED ORDER — HYDROMORPHONE HCL PF 1 MG/ML IJ SOLN
INTRAMUSCULAR | Status: DC | PRN
  Administered 2013-04-13: 1 mg via INTRAVENOUS

## 2013-04-13 MED ORDER — NEOSTIGMINE METHYLSULFATE 1 MG/ML IJ SOLN
INTRAMUSCULAR | Status: DC | PRN
  Administered 2013-04-13: 2.5 mg via INTRAVENOUS

## 2013-04-13 MED ORDER — SCOPOLAMINE 1 MG/3DAYS TD PT72
MEDICATED_PATCH | TRANSDERMAL | Status: AC
  Administered 2013-04-13: 1.5 mg via TRANSDERMAL
  Filled 2013-04-13: qty 1

## 2013-04-13 MED ORDER — DEXAMETHASONE SODIUM PHOSPHATE 10 MG/ML IJ SOLN
INTRAMUSCULAR | Status: DC | PRN
  Administered 2013-04-13: 10 mg via INTRAVENOUS

## 2013-04-13 MED ORDER — PROMETHAZINE HCL 25 MG/ML IJ SOLN: 6.2500 mg | INTRAMUSCULAR | Status: DC | PRN

## 2013-04-13 MED ORDER — NEOSTIGMINE METHYLSULFATE 1 MG/ML IJ SOLN
INTRAMUSCULAR | Status: AC
  Filled 2013-04-13: qty 1

## 2013-04-13 MED ORDER — PHENYLEPHRINE HCL 10 MG/ML IJ SOLN
INTRAMUSCULAR | Status: DC | PRN
  Administered 2013-04-13: 40 ug via INTRAVENOUS

## 2013-04-13 MED ORDER — BUPIVACAINE HCL (PF) 0.5 % IJ SOLN
INTRAMUSCULAR | Status: DC | PRN
  Administered 2013-04-13: 10 mL

## 2013-04-13 MED ORDER — ROCURONIUM BROMIDE 100 MG/10ML IV SOLN
INTRAVENOUS | Status: AC
  Filled 2013-04-13: qty 1

## 2013-04-13 MED ORDER — LACTATED RINGERS IV SOLN
INTRAVENOUS | Status: DC
  Administered 2013-04-13 (×5): via INTRAVENOUS

## 2013-04-13 MED ORDER — MIDAZOLAM HCL 2 MG/2ML IJ SOLN
INTRAMUSCULAR | Status: AC
  Filled 2013-04-13: qty 2

## 2013-04-13 MED ORDER — ROCURONIUM BROMIDE 100 MG/10ML IV SOLN
INTRAVENOUS | Status: DC | PRN
  Administered 2013-04-13: 10 mg via INTRAVENOUS

## 2013-04-13 MED ORDER — BUPIVACAINE-EPINEPHRINE 0.5% -1:200000 IJ SOLN
INTRAMUSCULAR | Status: DC | PRN
  Administered 2013-04-13: 20 mL

## 2013-04-13 MED ORDER — MEPERIDINE HCL 25 MG/ML IJ SOLN
6.2500 mg | INTRAMUSCULAR | Status: DC | PRN
  Administered 2013-04-13: 12.5 mg via INTRAVENOUS

## 2013-04-13 MED ORDER — GLYCOPYRROLATE 0.2 MG/ML IJ SOLN
INTRAMUSCULAR | Status: AC
  Filled 2013-04-13: qty 3

## 2013-04-13 MED ORDER — PROPOFOL 10 MG/ML IV EMUL
INTRAVENOUS | Status: AC
  Filled 2013-04-13: qty 20

## 2013-04-13 MED ORDER — OXYCODONE-ACETAMINOPHEN 5-325 MG PO TABS
1.0000 | ORAL_TABLET | ORAL | Status: DC | PRN
  Administered 2013-04-13 (×2): 1 via ORAL
  Administered 2013-04-14: 2 via ORAL
  Administered 2013-04-14: 1 via ORAL
  Filled 2013-04-13: qty 1
  Filled 2013-04-13: qty 2
  Filled 2013-04-13 (×2): qty 1

## 2013-04-13 MED ORDER — BUPIVACAINE HCL (PF) 0.5 % IJ SOLN
INTRAMUSCULAR | Status: AC
  Filled 2013-04-13: qty 30

## 2013-04-13 MED ORDER — PROPOFOL 10 MG/ML IV BOLUS
INTRAVENOUS | Status: DC | PRN
  Administered 2013-04-13: 30 mg via INTRAVENOUS
  Administered 2013-04-13: 170 mg via INTRAVENOUS
  Administered 2013-04-13: 40 mg via INTRAVENOUS

## 2013-04-13 MED ORDER — SODIUM CHLORIDE 0.9 % IJ SOLN
INTRAMUSCULAR | Status: AC
  Filled 2013-04-13: qty 100

## 2013-04-13 MED ORDER — PHENYLEPHRINE 40 MCG/ML (10ML) SYRINGE FOR IV PUSH (FOR BLOOD PRESSURE SUPPORT)
PREFILLED_SYRINGE | INTRAVENOUS | Status: AC
  Filled 2013-04-13: qty 5

## 2013-04-13 MED ORDER — SCOPOLAMINE 1 MG/3DAYS TD PT72
1.0000 | MEDICATED_PATCH | TRANSDERMAL | Status: DC
  Administered 2013-04-13: 1.5 mg via TRANSDERMAL

## 2013-04-13 MED ORDER — GLYCOPYRROLATE 0.2 MG/ML IJ SOLN
INTRAMUSCULAR | Status: DC | PRN
  Administered 2013-04-13: 0.1 mg via INTRAVENOUS
  Administered 2013-04-13: .5 mg via INTRAVENOUS

## 2013-04-13 MED ORDER — MIDAZOLAM HCL 2 MG/2ML IJ SOLN
INTRAMUSCULAR | Status: DC | PRN
  Administered 2013-04-13: 2 mg via INTRAVENOUS

## 2013-04-13 MED ORDER — LACTATED RINGERS IV SOLN
INTRAVENOUS | Status: DC
  Administered 2013-04-13 – 2013-04-14 (×2): via INTRAVENOUS

## 2013-04-13 MED ORDER — DEXAMETHASONE SODIUM PHOSPHATE 10 MG/ML IJ SOLN
INTRAMUSCULAR | Status: AC
  Filled 2013-04-13: qty 1

## 2013-04-13 MED ORDER — ONDANSETRON HCL 4 MG/2ML IJ SOLN
INTRAMUSCULAR | Status: AC
  Filled 2013-04-13: qty 2

## 2013-04-13 MED ORDER — FENTANYL CITRATE 0.05 MG/ML IJ SOLN
INTRAMUSCULAR | Status: AC
  Filled 2013-04-13: qty 2

## 2013-04-13 MED ORDER — LIDOCAINE HCL (CARDIAC) 20 MG/ML IV SOLN
INTRAVENOUS | Status: AC
  Filled 2013-04-13: qty 5

## 2013-04-13 MED ORDER — FENTANYL CITRATE 0.05 MG/ML IJ SOLN
INTRAMUSCULAR | Status: AC
  Filled 2013-04-13: qty 5

## 2013-04-13 MED ORDER — LIDOCAINE HCL (CARDIAC) 20 MG/ML IV SOLN
INTRAVENOUS | Status: DC | PRN
  Administered 2013-04-13: 80 mg via INTRAVENOUS

## 2013-04-13 MED ORDER — IBUPROFEN 800 MG PO TABS: 800.0000 mg | ORAL_TABLET | Freq: Three times a day (TID) | ORAL | Status: DC | PRN

## 2013-04-13 MED ORDER — ONDANSETRON HCL 4 MG/2ML IJ SOLN: 4.0000 mg | Freq: Four times a day (QID) | INTRAMUSCULAR | Status: DC | PRN

## 2013-04-13 MED ORDER — LACTATED RINGERS IR SOLN
Status: DC | PRN
  Administered 2013-04-13: 3000 mL

## 2013-04-13 MED ORDER — CEFAZOLIN SODIUM-DEXTROSE 2-3 GM-% IV SOLR
2.0000 g | INTRAVENOUS | Status: DC
Start: 2013-04-14 — End: 2013-04-13

## 2013-04-13 MED ORDER — BUPIVACAINE-EPINEPHRINE (PF) 0.5% -1:200000 IJ SOLN
INTRAMUSCULAR | Status: AC
  Filled 2013-04-13: qty 10

## 2013-04-13 MED ORDER — ONDANSETRON HCL 4 MG PO TABS: 4.0000 mg | ORAL_TABLET | Freq: Four times a day (QID) | ORAL | Status: DC | PRN

## 2013-04-13 MED ORDER — PROMETHAZINE HCL 25 MG/ML IJ SOLN
INTRAMUSCULAR | Status: AC
  Filled 2013-04-13: qty 1

## 2013-04-13 MED ORDER — CEFAZOLIN SODIUM-DEXTROSE 2-3 GM-% IV SOLR
INTRAVENOUS | Status: AC
  Filled 2013-04-13: qty 50

## 2013-04-13 MED ORDER — CEFAZOLIN SODIUM-DEXTROSE 2-3 GM-% IV SOLR
2.0000 g | INTRAVENOUS | Status: AC
  Administered 2013-04-13: 2 g via INTRAVENOUS

## 2013-04-13 MED ORDER — ONDANSETRON HCL 4 MG/2ML IJ SOLN
INTRAMUSCULAR | Status: DC | PRN
  Administered 2013-04-13: 4 mg via INTRAVENOUS

## 2013-04-13 MED ORDER — FENTANYL CITRATE 0.05 MG/ML IJ SOLN
INTRAMUSCULAR | Status: DC | PRN
  Administered 2013-04-13 (×6): 50 ug via INTRAVENOUS

## 2013-04-13 MED ORDER — BUSPIRONE HCL 5 MG PO TABS
15.0000 mg | ORAL_TABLET | Freq: Two times a day (BID) | ORAL | Status: DC
  Administered 2013-04-13 – 2013-04-14 (×2): 15 mg via ORAL
  Filled 2013-04-13 (×2): qty 3

## 2013-04-13 MED ORDER — HYDROMORPHONE HCL PF 1 MG/ML IJ SOLN
0.2000 mg | INTRAMUSCULAR | Status: DC | PRN
  Administered 2013-04-13: 0.6 mg via INTRAVENOUS
  Filled 2013-04-13: qty 1

## 2013-04-13 SURGICAL SUPPLY — 52 items
APPLICATOR COTTON TIP 6IN STRL (MISCELLANEOUS) ×2 IMPLANT
BAG SPEC RTRVL LRG 6X4 10 (ENDOMECHANICALS)
CABLE HIGH FREQUENCY MONO STRZ (ELECTRODE) IMPLANT
CANISTER SUCT 3000ML (MISCELLANEOUS) ×2 IMPLANT
CATH ROBINSON RED A/P 16FR (CATHETERS) ×1 IMPLANT
CLOTH BEACON ORANGE TIMEOUT ST (SAFETY) ×2 IMPLANT
CONT PATH 16OZ SNAP LID 3702 (MISCELLANEOUS) ×2 IMPLANT
COUNTER NEEDLE 1200 MAGNETIC (NEEDLE) ×1 IMPLANT
COVER LIGHT HANDLE  1/PK (MISCELLANEOUS) ×1
COVER LIGHT HANDLE 1/PK (MISCELLANEOUS) IMPLANT
DECANTER SPIKE VIAL GLASS SM (MISCELLANEOUS) ×4 IMPLANT
DRSG COVADERM 4X10 (GAUZE/BANDAGES/DRESSINGS) ×1 IMPLANT
DURAPREP 26ML APPLICATOR (WOUND CARE) ×4 IMPLANT
ELECT REM PT RETURN 9FT ADLT (ELECTROSURGICAL) ×2
ELECTRODE REM PT RTRN 9FT ADLT (ELECTROSURGICAL) IMPLANT
FORCEPS CUTTING 33CM 5MM (CUTTING FORCEPS) IMPLANT
FORCEPS CUTTING 45CM 5MM (CUTTING FORCEPS) IMPLANT
GLOVE BIO SURGEON STRL SZ 6.5 (GLOVE) ×5 IMPLANT
GLOVE BIO SURGEON STRL SZ7 (GLOVE) ×3 IMPLANT
GOWN STRL REIN XL XLG (GOWN DISPOSABLE) ×4 IMPLANT
NDL SAFETY ECLIPSE 18X1.5 (NEEDLE) ×1 IMPLANT
NDL SPNL 18GX3.5 QUINCKE PK (NEEDLE) ×1 IMPLANT
NEEDLE HYPO 18GX1.5 SHARP (NEEDLE) ×2
NEEDLE INSUFFLATION 120MM (ENDOMECHANICALS) ×2 IMPLANT
NEEDLE MAYO .5 CIRCLE (NEEDLE) ×2 IMPLANT
NEEDLE SPNL 18GX3.5 QUINCKE PK (NEEDLE) ×2 IMPLANT
NS IRRIG 1000ML POUR BTL (IV SOLUTION) ×2 IMPLANT
PACK LAVH (CUSTOM PROCEDURE TRAY) ×2 IMPLANT
POUCH SPECIMEN RETRIEVAL 10MM (ENDOMECHANICALS) IMPLANT
PROTECTOR NERVE ULNAR (MISCELLANEOUS) ×2 IMPLANT
SCALPEL HARMONIC ACE (MISCELLANEOUS) ×1 IMPLANT
SET IRRIG TUBING LAPAROSCOPIC (IRRIGATION / IRRIGATOR) ×1 IMPLANT
SLEEVE SURGEON STRL (DRAPES) ×1 IMPLANT
STRIP CLOSURE SKIN 1/2X4 (GAUZE/BANDAGES/DRESSINGS) ×1 IMPLANT
SUT VIC AB 0 CT1 27 (SUTURE) ×6
SUT VIC AB 0 CT1 27XBRD ANBCTR (SUTURE) IMPLANT
SUT VIC AB 0 CT1 36 (SUTURE) ×2 IMPLANT
SUT VIC AB 2-0 CT1 18 (SUTURE) ×5 IMPLANT
SUT VIC AB 2-0 CT1 27 (SUTURE) ×2
SUT VIC AB 2-0 CT1 TAPERPNT 27 (SUTURE) ×1 IMPLANT
SUT VICRYL 0 ENDOLOOP (SUTURE) IMPLANT
SUT VICRYL 0 TIES 12 18 (SUTURE) ×2 IMPLANT
SUT VICRYL 0 UR6 27IN ABS (SUTURE) ×4 IMPLANT
SUT VICRYL 4-0 PS2 18IN ABS (SUTURE) ×4 IMPLANT
SYR 30ML LL (SYRINGE) ×1 IMPLANT
SYRINGE IRR TOOMEY STRL 70CC (SYRINGE) ×2 IMPLANT
TOWEL OR 17X24 6PK STRL BLUE (TOWEL DISPOSABLE) ×6 IMPLANT
TRAY FOLEY CATH 14FR (SET/KITS/TRAYS/PACK) ×2 IMPLANT
TROCAR XCEL NON-BLD 11X100MML (ENDOMECHANICALS) IMPLANT
TROCAR XCEL NON-BLD 5MMX100MML (ENDOMECHANICALS) IMPLANT
WARMER LAPAROSCOPE (MISCELLANEOUS) ×2 IMPLANT
WATER STERILE IRR 1000ML POUR (IV SOLUTION) ×2 IMPLANT

## 2013-04-13 NOTE — Anesthesia Preprocedure Evaluation (Addendum)
Anesthesia Evaluation  Patient identified by MRN, date of birth, ID band Patient awake    Reviewed: Allergy & Precautions, H&P , NPO status , Patient's Chart, lab work & pertinent test results  History of Anesthesia Complications (+) PONV, DIFFICULT AIRWAY and history of anesthetic complications  Airway Mallampati: I TM Distance: >3 FB Neck ROM: Full    Dental no notable dental hx. (+) Teeth Intact   Pulmonary former smoker,  breath sounds clear to auscultation  Pulmonary exam normal       Cardiovascular negative cardio ROS  + Valvular Problems/Murmurs Rhythm:Regular Rate:Normal + Systolic murmurs Atrial Septal Defect - Secundum type   Neuro/Psych PSYCHIATRIC DISORDERS Anxiety Adjustment disorder - mixed type   GI/Hepatic Neg liver ROS, hiatal hernia, GERD-  Medicated and Controlled,IBS   Endo/Other  Obesity  Renal/GU negative Renal ROS  negative genitourinary   Musculoskeletal negative musculoskeletal ROS (+)   Abdominal (+) + obese,   Peds  Hematology  (+) anemia , Fe deficiency anemia   Anesthesia Other Findings   Reproductive/Obstetrics DUB  Fibroids                         Anesthesia Physical Anesthesia Plan  ASA: II  Anesthesia Plan: General   Post-op Pain Management:    Induction: Intravenous  Airway Management Planned: Oral ETT  Additional Equipment:   Intra-op Plan:   Post-operative Plan: Extubation in OR  Informed Consent: I have reviewed the patients History and Physical, chart, labs and discussed the procedure including the risks, benefits and alternatives for the proposed anesthesia with the patient or authorized representative who has indicated his/her understanding and acceptance.   Dental advisory given  Plan Discussed with: CRNA, Anesthesiologist and Surgeon  Anesthesia Plan Comments: (Use glidescope for intubation.)       Anesthesia Quick  Evaluation

## 2013-04-13 NOTE — Transfer of Care (Signed)
Immediate Anesthesia Transfer of Care Note  Patient: Hannah Rocha  Procedure(s) Performed: Procedure(s): LAPAROSCOPIC ASSISTED VAGINAL HYSTERECTOMY (Bilateral)  Patient Location: PACU  Anesthesia Type:General  Level of Consciousness: awake, alert  and oriented  Airway & Oxygen Therapy: Patient Spontanous Breathing and Patient connected to nasal cannula oxygen  Post-op Assessment: Report given to PACU RN and Post -op Vital signs reviewed and stable  Post vital signs: Reviewed and stable  Complications: No apparent anesthesia complications

## 2013-04-13 NOTE — H&P (Signed)
Hannah Rocha is an 48 y.o.MW  female. She would like a hysterectomy because her periods are very painful, lasting 7 days with IMB. She has bled as long as 3 weeks. She also complains of some dyspareunia. She also complains of LBP. During her laparoscopy for BL salpingectomy a year ago, I noted 4 large (4cm) pedunculated fibroids. An EMBX was normal. She generally takes tylenol and IBU 800 mg with some help. Ultrasound shows a 4 cm fibroid.  Menstrual History: Menarche age: 67 [place]: 63  Patient's last menstrual period was 03/24/2013.    Past Medical History  Diagnosis Date  . Allergy   . GERD (gastroesophageal reflux disease)   . Obesity   . PONV (postoperative nausea and vomiting)   . Anxiety   . H/O hiatal hernia   . Anemia     hx    Past Surgical History  Procedure Laterality Date  . Asd repair  1999    [facility]  . Mandible fracture surgery    . Cleft lip repair    . Laparoscopic tubal ligation  03/21/2012    Procedure: LAPAROSCOPIC TUBAL LIGATION;  Surgeon: Hannah Filbert, MD;  Location: [facility];  Service: Gynecology;  Laterality: Bilateral;  . Tubal ligation      Family History  Problem Relation Age of Onset  . Heart disease Mother   . Colon polyps Mother   . Prostate cancer Father   . Heart disease Father   . Colon cancer Neg Hx     Social History:  reports that she quit smoking about 26 years ago. Her smoking use included Cigarettes. She has a .125 pack-year smoking history. She has never used smokeless tobacco. She reports that she drinks alcohol. She reports that she does not use illicit drugs.  Allergies:  Allergies  Allergen Reactions  . Sertraline Hcl     REACTION: vomiting, severe anxiety    Prescriptions prior to admission  Medication Sig Dispense Refill  . bisacodyl (DULCOLAX) 5 MG EC tablet Take 5 mg by mouth daily as needed for moderate constipation.      . busPIRone (BUSPAR) 15 MG tablet TAKE 1 TABLET (15 MG TOTAL) BY MOUTH 2 (TWO) TIMES DAILY.  60  tablet  2  . loratadine (ALAVERT) 10 MG tablet Take 10 mg by mouth daily.        . pantoprazole (PROTONIX) 40 MG tablet Take 1 tablet (40 mg total) by mouth daily.  30 tablet  11  . acetaminophen (TYLENOL) 500 MG tablet Take 1,000 mg by mouth daily as needed for mild pain. Takes about once per week.      . flintstones complete (FLINTSTONES) 60 MG chewable tablet Chew 1 tablet by mouth daily.      . fluocinonide-emollient (LIDEX-E) 0.05 % cream Apply 1 application topically 2 (two) times daily.  30 g  0  . fluticasone (FLONASE) 50 MCG/ACT nasal spray USE 2 SPRAYS IN EACH NOSTRIL ONCE A DAY  16 g  4  . ibuprofen (ADVIL,MOTRIN) 800 MG tablet Take 1 tablet (800 mg total) by mouth every 8 (eight) hours as needed for pain.  60 tablet  1    ROS marrried for 14 years Works at [employer]  Blood pressure 139/81, pulse 85, temperature 97.9 F (36.6 C), temperature source Oral, resp. rate 20, last menstrual period 03/24/2013, SpO2 100.00%. Physical Exam Heart-rrr Lungs- CTAB Abd- benign  No results found for this or any previous visit (from the past 24 hour(s)).  No  results found.  Assessment/Plan: Symptomatic fibroids- plan for LAVH/BS.  She understands the risks of surgery, including, but not to infection, bleeding, DVTs, damage to bowel, bladder, ureters. She wishes to proceed.     Hannah Rocha C. 04/13/2013, 8:20 AM

## 2013-04-13 NOTE — Anesthesia Procedure Notes (Addendum)
Procedure Name: Intubation Date/Time: 04/13/2013 9:24 AM Performed by: Flossie Dibble Pre-anesthesia Checklist: Patient identified, Timeout performed, Emergency Drugs available, Suction available and Patient being monitored Patient Re-evaluated:Patient Re-evaluated prior to inductionOxygen Delivery Method: Circle system utilized Preoxygenation: Pre-oxygenation with 100% oxygen Intubation Type: IV induction Ventilation: Oral airway inserted - appropriate to patient size and Mask ventilation without difficulty Tube size: 7.0 mm Airway Equipment and Method: Patient positioned with wedge pillow and Video-laryngoscopy Secured at: 21 cm

## 2013-04-13 NOTE — Op Note (Signed)
04/13/2013  11:34 AM  PATIENT:  Hannah Rocha  48 y.o. female  PRE-OPERATIVE DIAGNOSIS:  cpt [phone] - Fibroids and dysfunctional uterine bleeding  POST-OPERATIVE DIAGNOSIS:  cpt [id number] - Fibroids and dysfunctional uterine bleeding + endometriosis  PROCEDURE:  Procedure(s): LAPAROSCOPIC ASSISTED VAGINAL HYSTERECTOMY (Bilateral)  SURGEON:  Surgeon(s) and Role:    * Emily Filbert, MD - Primary    PHYSICIAN ASSISTANT:   ASSISTANTS: Lavonia Drafts   ANESTHESIA:   general  EBL:  Total I/O In: 3500 [I.V.:3500] Out: 850 [Urine:300; Blood:550]  BLOOD ADMINISTERED:none  DRAINS: none   LOCAL MEDICATIONS USED:  MARCAINE     SPECIMEN:  Source of Specimen:  uterus  DISPOSITION OF SPECIMEN:  PATHOLOGY  COUNTS:  YES  TOURNIQUET:  * No tourniquets in log *  DICTATION: .Dragon Dictation  PLAN OF CARE: Admit for overnight observation  PATIENT DISPOSITION:  PACU - hemodynamically stable.   Delay start of Pharmacological VTE agent (>24hrs) due to surgical blood loss or risk of bleeding: not applicable   The risks, benefits, and alternatives of surgery were explained, accepted, and understood. Consents were signed. She was taken to the operating room and placed in the dorsal lithotomy position. When she was comfortable, general anesthesia was applied without complication. Her abdomen and vagina were prepped and draped in the usual sterile fashion. A bimanual exam revealed an enlarged fibroid uterus, her adnexa were not palpable. A Hulka manipulator was placed. A Foley catheter was placed and a drained clear urine throughout the case. Gloves were changed and attention was turned to the abdomen. 0.5% Marcaine was used to inject at all the incision sites prior to making an incision. A 10 mm incision was made in the umbilicus. A varies needle was placed intraperitoneally. Low-flow CO2 was used to insufflate the abdomen to approximately 3 L. After an excellent pneumoperitoneum was  established a 10 mm trocar was placed. Laparoscopy confirmed correct placement. Under direct laparoscopic visualization I placed a 5 mm port in each lower quadrant. She was placed in the Trendelenburg position. The pelvis was inspected. Both adnexa were very small. Her uterus had 2 5 cm pedunculated fibroids. There were endometriosis (powder burn lesions) on the bladder and bowell.   We used a harmonic scalpel to ligate the round ligaments on each side. The uteroovarian ligaments were also ligated. There was a omental adhesion to the pedunculated right side pedunculated fibroid.  I then moved to the vaginal approach. I used a solution of 30cc of 0.5% marcaine with 100 cc of normal saline to hydrodisect at the cervicovaginal junction. I made a circumferential incision at the site. The posterior peritoneum was entered sharply and a long weighted speculum was placed.  2-0 vicryl suture is used throughout unless otherwise specified.  I entered the anterior peritoneum as well. I then continued to separate the uterus from its pelvic attachments. The uterus was morcelated to allow its removal.  I closed the vaginal cuff in a vertical fashion with a 0 vicryl suture in a running, locking fashion. Gloves were changed and attention was turned back to the abdomen. The pelvis was inspected and noted to be hemostatic. The CO2 was allowed to escape from the abdomen. The fascia at the umbilicus was closed with a 0 Vicryl figure-of-eight suture. No defects were palpable. A subcuticular closure was done with 4-0 Vicryl at all of the skin incisions. Steri-Strips were placed across the incision. She was extubated and taken to the recovery room in stable condition.

## 2013-04-13 NOTE — Anesthesia Postprocedure Evaluation (Signed)
  Anesthesia Post-op Note  Patient: Hannah Rocha  Procedure(s) Performed: Procedure(s): LAPAROSCOPIC ASSISTED VAGINAL HYSTERECTOMY (Bilateral)  Patient Location: PACU  Anesthesia Type:General  Level of Consciousness: awake, alert  and oriented  Airway and Oxygen Therapy: Patient Spontanous Breathing  Post-op Pain: mild  Post-op Assessment: Post-op Vital signs reviewed, Patient's Cardiovascular Status Stable, Respiratory Function Stable, Patent Airway, No signs of Nausea or vomiting and Pain level controlled  Post-op Vital Signs: Reviewed and stable  Complications: No apparent anesthesia complications

## 2013-04-14 ENCOUNTER — Encounter (HOSPITAL_COMMUNITY): Payer: Self-pay | Admitting: Obstetrics & Gynecology

## 2013-04-14 LAB — CBC
HEMATOCRIT: 28.1 % — AB (ref 36.0–46.0)
HEMOGLOBIN: 9.8 g/dL — AB (ref 12.0–15.0)
MCH: 31 pg (ref 26.0–34.0)
MCHC: 34.9 g/dL (ref 30.0–36.0)
MCV: 88.9 fL (ref 78.0–100.0)
Platelets: 170 10*3/uL (ref 150–400)
RBC: 3.16 MIL/uL — ABNORMAL LOW (ref 3.87–5.11)
RDW: 12.5 % (ref 11.5–15.5)
WBC: 15.4 10*3/uL — AB (ref 4.0–10.5)

## 2013-04-14 MED ORDER — IBUPROFEN 800 MG PO TABS: 800.0000 mg | ORAL_TABLET | Freq: Three times a day (TID) | ORAL | Status: DC | PRN

## 2013-04-14 MED ORDER — OXYCODONE-ACETAMINOPHEN 5-325 MG PO TABS: 1.0000 | ORAL_TABLET | ORAL | Status: DC | PRN

## 2013-04-14 MED ORDER — PANTOPRAZOLE SODIUM 40 MG PO TBEC
40.0000 mg | DELAYED_RELEASE_TABLET | Freq: Every day | ORAL | Status: DC
  Administered 2013-04-14: 40 mg via ORAL
  Filled 2013-04-14: qty 1

## 2013-04-14 NOTE — Progress Notes (Signed)
Pt  Out in wheelchair   Teaching complete  

## 2013-04-14 NOTE — Discharge Instructions (Signed)
Hysterectomy °Care After °Refer to this sheet in the next few weeks. These instructions provide you with information on caring for yourself after your procedure. Your caregiver may also give you more specific instructions. Your treatment has been planned according to current medical practices, but problems sometimes occur. Call your caregiver if you have any problems or questions after your procedure. °HOME CARE INSTRUCTIONS  °Healing will take time. You may have discomfort, tenderness, swelling, and bruising at the surgical site for about 2 weeks. This is normal and will get better as time goes on. °· Only take over-the-counter or prescription medicines for pain, discomfort, or fever as directed by your caregiver. °· Do not take aspirin. It can cause bleeding. °· Do not drive when taking pain medicine. °· Follow your caregiver's advice regarding exercise, lifting, driving, and general activities. °· Resume your usual diet as directed and allowed. °· Get plenty of rest and sleep. °· Do not douche, use tampons, or have sexual intercourse for at least 6 weeks or until your caregiver gives you permission. °· Change your bandages (dressings) as directed by your caregiver. °· Monitor your temperature. °· Take showers instead of baths for 2 to 3 weeks. °· Do not drink alcohol until your caregiver gives you permission. °· If you are constipated, you may take a mild laxative with your caregiver's permission. Bran foods may help with constipation problems. Drinking enough fluids to keep your urine clear or pale yellow may help as well. °· Try to have someone home with you for 1 or 2 weeks to help around the house. °· Keep all of your follow-up appointments as directed by your caregiver. °SEEK MEDICAL CARE IF:  °· You have swelling, redness, or increasing pain in the surgical cut (incision) area. °· You have pus coming from the incision. °· You notice a bad smell coming from the incision or dressing. °· You have swelling,  redness, or pain around the intravenous (IV) site. °· Your incision breaks open. °· You feel dizzy or lightheaded. °· You have pain or bleeding when you urinate. °· You have persistent diarrhea. °· You have persistent nausea and vomiting. °· You have abnormal vaginal discharge. °· You have a rash. °· You have any type of abnormal reaction or develop an allergy to your medicine. °· Your pain is not controlled with your prescribed medicine. °SEEK IMMEDIATE MEDICAL CARE IF:  °· You have a fever. °· You have severe abdominal pain. °· You have chest pain. °· You have shortness of breath. °· You faint. °· You have pain, swelling, or redness of your leg. °· You have heavy vaginal bleeding with blood clots. °MAKE SURE YOU: °· Understand these instructions. °· Will watch your condition. °· Will get help right away if you are not doing well or get worse. °Document Released: 09/26/2004 Document Revised: 06/01/2011 Document Reviewed: 10/24/2010 °[employer]® Patient Information ©2014 [employer]. ° °

## 2013-04-14 NOTE — Discharge Summary (Signed)
Physician Discharge Summary  Patient ID: Hannah Rocha MRN: [id number] DOB/AGE: [birth year] 48 y.o.  Admit date: 04/13/2013 Discharge date: 04/14/2013  Admission Diagnoses: Symptomatic fibroids   Discharge Diagnoses: same + endometriosis and anemia  Hospital Course: She underwent an uncomplicated LAVH. By POD #1 she was ambulating, voiding, and tolerating po without nausea or vomitting. She voiced her readiness to go home. Her PO hbg was 9.3 and her vitals were stable.  Discharge Exam: Blood pressure 126/65, pulse 82, temperature 98.2 F (36.8 C), temperature source Oral, resp. rate 18, height 5\' 3"  (1.6 m), weight 83.008 kg (183 lb), last menstrual period 03/24/2013, SpO2 100.00%. General appearance: alert Resp: clear to auscultation bilaterally Cardio: regular rate and rhythm, S1, S2 normal, no murmur, click, rub or gallop GI: soft, non-tender; bowel sounds normal; no masses,  no organomegaly Incisions: c/d/i/no erythema  Disposition: 01-Home or Self Care     Medication List    STOP taking these medications       acetaminophen 500 MG tablet  Commonly known as:  TYLENOL      TAKE these medications       ALAVERT 10 MG tablet  Generic drug:  loratadine  Take 10 mg by mouth daily.     bisacodyl 5 MG EC tablet  Commonly known as:  DULCOLAX  Take 5 mg by mouth daily as needed for moderate constipation.     busPIRone 15 MG tablet  Commonly known as:  BUSPAR  TAKE 1 TABLET (15 MG TOTAL) BY MOUTH 2 (TWO) TIMES DAILY.     flintstones complete 60 MG chewable tablet  Chew 1 tablet by mouth daily.     fluocinonide-emollient 0.05 % cream  Commonly known as:  LIDEX-E  Apply 1 application topically 2 (two) times daily.     fluticasone 50 MCG/ACT nasal spray  Commonly known as:  FLONASE  USE 2 SPRAYS IN EACH NOSTRIL ONCE A DAY     ibuprofen 800 MG tablet  Commonly known as:  ADVIL,MOTRIN  Take 1 tablet (800 mg total) by mouth every 8 (eight) hours as needed for pain.     ibuprofen 800 MG tablet  Commonly known as:  ADVIL,MOTRIN  Take 1 tablet (800 mg total) by mouth every 8 (eight) hours as needed (mild pain).     oxyCODONE-acetaminophen 5-325 MG per tablet  Commonly known as:  PERCOCET/ROXICET  Take 1-2 tablets by mouth every 4 (four) hours as needed for severe pain (moderate to severe pain (when tolerating fluids)).     pantoprazole 40 MG tablet  Commonly known as:  PROTONIX  Take 1 tablet (40 mg total) by mouth daily.           Follow-up Information   Schedule an appointment as soon as possible for a visit with Emily Filbert., MD.   Specialty:  Obstetrics and Gynecology   Contact information:   [place]  [phone]       Signed: Miklo Aken C. 04/14/2013, 10:07 AM

## 2013-05-04 ENCOUNTER — Encounter: Payer: BC Managed Care – PPO | Admitting: Obstetrics & Gynecology

## 2013-05-05 ENCOUNTER — Ambulatory Visit (INDEPENDENT_AMBULATORY_CARE_PROVIDER_SITE_OTHER): Payer: BC Managed Care – PPO | Admitting: Obstetrics & Gynecology

## 2013-05-05 ENCOUNTER — Encounter: Payer: Self-pay | Admitting: Obstetrics & Gynecology

## 2013-05-05 VITALS — BP 156/85 | HR 92 | Ht 64.0 in | Wt 185.0 lb

## 2013-05-05 DIAGNOSIS — Z9889 Other specified postprocedural states: Secondary | ICD-10-CM

## 2013-05-05 DIAGNOSIS — Z09 Encounter for follow-up examination after completed treatment for conditions other than malignant neoplasm: Secondary | ICD-10-CM

## 2013-05-05 NOTE — Progress Notes (Signed)
   Subjective:    Patient ID: Hannah Rocha, female    DOB: [birth year], 48 y.o.   MRN: [id number]  HPI She is here for a post op visit (LAVH) 3 weeks ago.   Review of Systems     Objective:   Physical Exam        Assessment & Plan:

## 2013-05-18 ENCOUNTER — Other Ambulatory Visit: Payer: Self-pay | Admitting: Family Medicine

## 2013-05-23 ENCOUNTER — Encounter: Payer: Self-pay | Admitting: Gastroenterology

## 2013-05-23 ENCOUNTER — Ambulatory Visit (INDEPENDENT_AMBULATORY_CARE_PROVIDER_SITE_OTHER): Payer: BC Managed Care – PPO | Admitting: Gastroenterology

## 2013-05-23 VITALS — BP 138/82 | HR 72 | Ht 64.0 in | Wt 183.2 lb

## 2013-05-23 DIAGNOSIS — K219 Gastro-esophageal reflux disease without esophagitis: Secondary | ICD-10-CM

## 2013-05-23 NOTE — Assessment & Plan Note (Signed)
Reason flareup of pyrosis despite taking Protonix.  This may have been due to regular use of ibuprofen.  Recommendations #1 trial of Zegerid 40 mg every morning and each bedtime for 2 weeks; if she's improved she will go back to Protonix #2 antireflux measures  Patient was instructed to call back if she's not improved in the next 2 weeks

## 2013-05-23 NOTE — Progress Notes (Signed)
_                                                                                                                History of Present Illness: This 48 year old white female with history of GERD, status post vaginal hysterectomy approximately 6 weeks ago, here for evaluation of pyrosis.  She had been taking ibuprofen fairly regularly postoperatively but stopped at least 3 weeks ago.  Over the past week she's had very frequent pyrosis at anytime during the day or night.  She denies pyrosis.  She continues on Protonix daily.    Past Medical History  Diagnosis Date  . Allergy   . GERD (gastroesophageal reflux disease)   . Obesity   . PONV (postoperative nausea and vomiting)   . Anxiety   . H/O hiatal hernia   . Anemia     hx   Past Surgical History  Procedure Laterality Date  . Asd repair  1999    [facility]  . Mandible fracture surgery    . Cleft lip repair    . Laparoscopic tubal ligation  03/21/2012    Procedure: LAPAROSCOPIC TUBAL LIGATION;  Surgeon: Emily Filbert, MD;  Location: [facility];  Service: Gynecology;  Laterality: Bilateral;  . Tubal ligation    . Laparoscopic assisted vaginal hysterectomy Bilateral 04/13/2013    Procedure: LAPAROSCOPIC ASSISTED VAGINAL HYSTERECTOMY;  Surgeon: Emily Filbert, MD;  Location: [facility];  Service: Gynecology;  Laterality: Bilateral;   family history includes Colon polyps in her mother; Heart disease in her father and mother; Prostate cancer in her father. There is no history of Colon cancer. Current Outpatient Prescriptions  Medication Sig Dispense Refill  . busPIRone (BUSPAR) 15 MG tablet TAKE 1 TABLET (15 MG TOTAL) BY MOUTH 2 (TWO) TIMES DAILY.  60 tablet  2  . flintstones complete (FLINTSTONES) 60 MG chewable tablet Chew 1 tablet by mouth daily.      . fluocinonide-emollient (LIDEX-E) 0.05 % cream Apply 1 application topically 2 (two) times daily.  30 g  0  . fluticasone (FLONASE) 50 MCG/ACT nasal spray USE 2 SPRAYS IN EACH NOSTRIL ONCE  A DAY  16 g  4  . ibuprofen (ADVIL,MOTRIN) 800 MG tablet Take 1 tablet (800 mg total) by mouth every 8 (eight) hours as needed for pain.  60 tablet  1  . loratadine (ALAVERT) 10 MG tablet Take 10 mg by mouth daily.        . pantoprazole (PROTONIX) 40 MG tablet Take 1 tablet (40 mg total) by mouth daily.  30 tablet  11   No current facility-administered medications for this visit.   Allergies as of 05/23/2013 - Review Complete 05/23/2013  Allergen Reaction Noted  . Sertraline hcl  01/27/2010    reports that she quit smoking about 26 years ago. Her smoking use included Cigarettes. She has a .125 pack-year smoking history. She has never used smokeless tobacco. She reports that she drinks alcohol. She reports that she does not use illicit drugs.  Review of Systems: Pertinent positive and negative review of systems were noted in the above HPI section. All other review of systems were otherwise negative.  Vital signs were reviewed in today's medical record Physical Exam: General: Well developed , well nourished, no acute distress Skin: anicteric Head: Normocephalic and atraumatic Eyes:  sclerae anicteric, EOMI Ears: Normal auditory acuity Mouth: No deformity or lesions Neck: Supple, no masses or thyromegaly Lungs: Clear throughout to auscultation Heart: Regular rate and rhythm; no murmurs, rubs or bruits Abdomen: Soft, non tender and non distended. No masses, hepatosplenomegaly or hernias noted. Normal Bowel sounds Rectal:deferred Musculoskeletal: Symmetrical with no gross deformities  Skin: No lesions on visible extremities Pulses:  Normal pulses noted Extremities: No clubbing, cyanosis, edema or deformities noted Neurological: Alert oriented x 4, grossly nonfocal Cervical Nodes:  No significant cervical adenopathy Inguinal Nodes: No significant inguinal adenopathy Psychological:  Alert and cooperative. Normal mood and affect  See Assessment and Plan under Problem  List

## 2013-05-23 NOTE — Patient Instructions (Signed)
We are giving you Zegerid Samples today Take for 2 weeks If this has not helped you by then call the office [phone]

## 2013-06-21 ENCOUNTER — Other Ambulatory Visit: Payer: Self-pay | Admitting: Family Medicine

## 2013-08-11 ENCOUNTER — Other Ambulatory Visit: Payer: Self-pay | Admitting: Family Medicine

## 2013-10-09 ENCOUNTER — Other Ambulatory Visit: Payer: Self-pay | Admitting: Family Medicine

## 2013-10-11 ENCOUNTER — Telehealth: Payer: Self-pay | Admitting: Gastroenterology

## 2013-10-11 NOTE — Telephone Encounter (Signed)
Pt requests to change from Dr. Deatra Ina to Dr. Fuller Plan because her mother sees Dr. Fuller Plan. Dr. Deatra Ina will you approve the switch? Please advise.

## 2013-10-11 NOTE — Telephone Encounter (Signed)
yes

## 2013-10-12 NOTE — Telephone Encounter (Signed)
Dr. Fuller Plan will you accept this pt? She states you see her mother. Please advise.

## 2013-10-12 NOTE — Telephone Encounter (Signed)
OK 

## 2013-10-13 NOTE — Telephone Encounter (Signed)
Pt does not have any GI issues to be seen for right now, but will call back to schedule with Dr. Fuller Plan if she needs or get her meds refilled.  She appreciated the switch.

## 2013-11-07 ENCOUNTER — Other Ambulatory Visit: Payer: Self-pay | Admitting: Gastroenterology

## 2013-11-07 NOTE — Telephone Encounter (Signed)
Patient switched from Dr. Deatra Ina to Dr. Fuller Plan

## 2013-11-14 ENCOUNTER — Other Ambulatory Visit: Payer: Self-pay | Admitting: Family Medicine

## 2013-12-11 ENCOUNTER — Encounter: Payer: Self-pay | Admitting: Gastroenterology

## 2013-12-11 ENCOUNTER — Ambulatory Visit (INDEPENDENT_AMBULATORY_CARE_PROVIDER_SITE_OTHER): Payer: BC Managed Care – PPO | Admitting: Gastroenterology

## 2013-12-11 VITALS — BP 138/76 | HR 80 | Ht 64.0 in | Wt 178.8 lb

## 2013-12-11 DIAGNOSIS — K219 Gastro-esophageal reflux disease without esophagitis: Secondary | ICD-10-CM

## 2013-12-11 MED ORDER — PANTOPRAZOLE SODIUM 40 MG PO TBEC: DELAYED_RELEASE_TABLET | ORAL | Status: DC

## 2013-12-11 NOTE — Progress Notes (Signed)
    History of Present Illness: This is a 48 year old female previously followed by Dr. Verl Blalock. She has GERD that is well controlled on daily pantoprazole. She underwent upper endoscopy and colonoscopy in April 2011.  Current Medications, Allergies, Past Medical History, Past Surgical History, Family History and Social History were reviewed in [employer] record.  Physical Exam: General: Well developed , well nourished, no acute distress Head: Normocephalic and atraumatic Eyes:  sclerae anicteric, EOMI Ears: Normal auditory acuity Mouth: No deformity or lesions Lungs: Clear throughout to auscultation Heart: Regular rate and rhythm; no murmurs, rubs or bruits Abdomen: Soft, non tender and non distended. No masses, hepatosplenomegaly or hernias noted. Normal Bowel sounds Musculoskeletal: Symmetrical with no gross deformities  Pulses:  Normal pulses noted Extremities: No clubbing, cyanosis, edema or deformities noted Neurological: Alert oriented x 4, grossly nonfocal Psychological:  Alert and cooperative. Normal mood and affect  Assessment and Recommendations:  1. GERD. Standard antireflux measures and pantoprazole 40 mg daily.  2. CRC screening due in 06/2019.

## 2013-12-11 NOTE — Patient Instructions (Signed)
We have sent the following medications to your pharmacy for you to pick up at your convenience: pantoprazole.  You will be due for a recall colonoscopy in 06/2019. We will send you a reminder in the mail when it gets closer to that time.  Thank you for choosing me and [facility].  Pricilla Riffle. Dagoberto Ligas., MD., Marval Regal

## 2013-12-18 ENCOUNTER — Ambulatory Visit: Payer: Self-pay

## 2013-12-20 ENCOUNTER — Other Ambulatory Visit: Payer: Self-pay | Admitting: *Deleted

## 2013-12-20 MED ORDER — BUSPIRONE HCL 15 MG PO TABS: ORAL_TABLET | ORAL | Status: DC

## 2013-12-20 NOTE — Telephone Encounter (Signed)
Spoke to pt and informed her an OV is required for additional refills, as indicated on last Rx. F/u appt sched for 10/8. Pt advised if she does not attend appt she will be unable to receive refills until she is seen.

## 2013-12-28 ENCOUNTER — Ambulatory Visit (INDEPENDENT_AMBULATORY_CARE_PROVIDER_SITE_OTHER): Payer: BC Managed Care – PPO | Admitting: Family Medicine

## 2013-12-28 ENCOUNTER — Encounter: Payer: Self-pay | Admitting: Family Medicine

## 2013-12-28 VITALS — BP 126/72 | HR 78 | Temp 98.0°F | Wt 178.5 lb

## 2013-12-28 DIAGNOSIS — I8390 Asymptomatic varicose veins of unspecified lower extremity: Secondary | ICD-10-CM

## 2013-12-28 DIAGNOSIS — R208 Other disturbances of skin sensation: Secondary | ICD-10-CM

## 2013-12-28 DIAGNOSIS — F419 Anxiety disorder, unspecified: Secondary | ICD-10-CM

## 2013-12-28 DIAGNOSIS — R209 Unspecified disturbances of skin sensation: Secondary | ICD-10-CM | POA: Insufficient documentation

## 2013-12-28 DIAGNOSIS — Z13220 Encounter for screening for lipoid disorders: Secondary | ICD-10-CM

## 2013-12-28 DIAGNOSIS — I868 Varicose veins of other specified sites: Secondary | ICD-10-CM

## 2013-12-28 LAB — LIPID PANEL
Cholesterol: 183 mg/dL (ref 0–200)
HDL: 19.8 mg/dL — ABNORMAL LOW (ref >39.00)
NonHDL: 163.2
Total CHOL/HDL Ratio: 9
Triglycerides: 656 mg/dL — ABNORMAL HIGH (ref 0.0–149.0)
VLDL: 131.2 mg/dL — AB (ref 0.0–40.0)

## 2013-12-28 LAB — COMPREHENSIVE METABOLIC PANEL
ALBUMIN: 3.7 g/dL (ref 3.5–5.2)
ALK PHOS: 61 U/L (ref 39–117)
ALT: 21 U/L (ref 0–35)
AST: 17 U/L (ref 0–37)
BUN: 16 mg/dL (ref 6–23)
CALCIUM: 9.3 mg/dL (ref 8.4–10.5)
CHLORIDE: 103 meq/L (ref 96–112)
CO2: 26 mEq/L (ref 19–32)
Creatinine, Ser: 0.7 mg/dL (ref 0.4–1.2)
GFR: 91.24 mL/min (ref >60.00)
GLUCOSE: 170 mg/dL — AB (ref 70–99)
POTASSIUM: 4.7 meq/L (ref 3.5–5.1)
Sodium: 135 mEq/L (ref 135–145)
Total Bilirubin: 0.3 mg/dL (ref 0.2–1.2)
Total Protein: 7.6 g/dL (ref 6.0–8.3)

## 2013-12-28 LAB — LDL CHOLESTEROL, DIRECT: LDL DIRECT: 54 mg/dL

## 2013-12-28 LAB — TSH: TSH: 3.35 u[IU]/mL (ref 0.35–4.50)

## 2013-12-28 MED ORDER — BUSPIRONE HCL 15 MG PO TABS: ORAL_TABLET | ORAL | Status: DC

## 2013-12-28 NOTE — Progress Notes (Signed)
Pre visit review using our clinic review tool, if applicable. No additional management support is needed unless otherwise documented below in the visit note. 

## 2013-12-28 NOTE — Assessment & Plan Note (Signed)
New- asymptomatic. Reassurance provided. I did suggest she see a vein specialist if she would like procedure for cosmetic reasons. If she does develop symptoms, she will let me know ASAP.

## 2013-12-28 NOTE — Patient Instructions (Signed)
Good to see you. We will call you with your lab results from today. 

## 2013-12-28 NOTE — Progress Notes (Signed)
Subjective:    Patient ID: Hannah Rocha, female    DOB: 06-14-1965, 48 y.o.   MRN: [id number]  HPI  HTN-  Diet controlled, more related to her anxiety. Not taking any antihypertensives.  Anxiety- seems well controlled on Buspar 15 mg twice daily. Still "an anxious person" but no longer having panic attacks. Denies depression. Sleeping better. Appetite good-  Has been trying to lose weight- walking more and eating less. Wt Readings from Last 3 Encounters:  12/28/13 178 lb 8 oz (80.967 kg)  12/11/13 178 lb 12.8 oz (81.103 kg)  05/23/13 183 lb 3.2 oz (83.099 kg)    No results found for this basename: CHOL, HDL, LDLCALC, LDLDIRECT, TRIG, CHOLHDL   Lab Results  Component Value Date   CREATININE 0.75 02/07/2013   Varicose vein- noticed it about a year ago- inner left thigh. Non tender.  Wanted to know if she needed to do something about it.  Current Outpatient Prescriptions on File Prior to Visit  Medication Sig Dispense Refill  . flintstones complete (FLINTSTONES) 60 MG chewable tablet Chew 1 tablet by mouth daily.      . fluocinonide-emollient (LIDEX-E) 0.05 % cream Apply 1 application topically 2 (two) times daily.  30 g  0  . fluticasone (FLONASE) 50 MCG/ACT nasal spray USE 2 SPRAYS IN EACH NOSTRIL ONCE A DAY  16 g  4  . ibuprofen (ADVIL,MOTRIN) 800 MG tablet Take 1 tablet (800 mg total) by mouth every 8 (eight) hours as needed for pain.  60 tablet  1  . loratadine (ALAVERT) 10 MG tablet Take 10 mg by mouth daily.        . pantoprazole (PROTONIX) 40 MG tablet TAKE 1 TABLET (40 MG TOTAL) BY MOUTH DAILY.  30 tablet  11   No current facility-administered medications on file prior to visit.    Allergies  Allergen Reactions  . Sertraline Hcl     REACTION: vomiting, severe anxiety    Past Medical History  Diagnosis Date  . Allergy   . GERD (gastroesophageal reflux disease)   . Obesity   . PONV (postoperative nausea and vomiting)   . Anxiety   . H/O hiatal  hernia   . Anemia     hx    Past Surgical History  Procedure Laterality Date  . Asd repair  1999    [facility]  . Mandible fracture surgery    . Cleft lip repair    . Laparoscopic tubal ligation  03/21/2012    Procedure: LAPAROSCOPIC TUBAL LIGATION;  Surgeon: Emily Filbert, MD;  Location: [facility];  Service: Gynecology;  Laterality: Bilateral;  . Tubal ligation    . Laparoscopic assisted vaginal hysterectomy Bilateral 04/13/2013    Procedure: LAPAROSCOPIC ASSISTED VAGINAL HYSTERECTOMY;  Surgeon: Emily Filbert, MD;  Location: [facility];  Service: Gynecology;  Laterality: Bilateral;    Family History  Problem Relation Age of Onset  . Heart disease Mother   . Colon polyps Mother   . Prostate cancer Father   . Heart disease Father   . Colon cancer Neg Hx     History   Social History  . Marital Status: Married    Spouse Name: N/A    Number of Children: 0  . Years of Education: N/A   Occupational History  . [employer]    Social History Main Topics  . Smoking status: Former Smoker -- 0.25 packs/day for .5 years    Types: Cigarettes    Quit date: 02/21/1987  .  Smokeless tobacco: Never Used     Comment: 22 years ago-light smoker  . Alcohol Use: Yes     Comment: occasional  . Drug Use: No  . Sexual Activity: Yes    Partners: Male    Birth Control/ Protection: Surgical     Comment: tubaligation   Other Topics Concern  . Not on file   Social History Narrative   Daily caffeine use   The PMH, PSH, Social History, Family History, Medications, and allergies have been reviewed in [facility], and have been updated if relevant.   Review of Systems See HPI No CP No SOB No leg pain or swelling +sensation of coldness in her feet since her hysterectomy No foot pain No HA    Objective:   Physical Exam BP 126/72  Pulse 78  Temp(Src) 98 F (36.7 C) (Oral)  Wt 178 lb 8 oz (80.967 kg)  SpO2 96%  LMP 03/24/2013   General:  Well-developed,well-nourished,in no acute distress;  alert,appropriate and cooperative throughout examination Head:  normocephalic and atraumatic.   Eyes:  vision grossly intact, pupils equal, pupils round, and pupils reactive to light.   Ears:  R ear normal and L ear normal.   Nose:  no external deformity.   Mouth:  good dentition.   Neck:  No deformities, masses, or tenderness noted. Breasts:  No mass, nodules, thickening, tenderness, bulging, retraction, inflamation, nipple discharge or skin changes noted.   Lungs:  Normal respiratory effort, chest expands symmetrically. Lungs are clear to auscultation, no crackles or wheezes. Heart:  Normal rate and regular rhythm. S1 and S2 normal without gallop, murmur, click, rub or other extra sounds. Extremities:  No clubbing, cyanosis, edema, or deformity noted with normal full range of motion of all joints.  Varicosity of left inner upper thigh, NTTP, no erythema or warmth  Neurologic:  alert & oriented X3 and gait normal.   Skin:  Intact without suspicious lesions or rashes Psych:  Cognition and judgment appear intact. Alert and cooperative with normal attention span and concentration. No apparent delusions, illusions, hallucinations       Assessment & Plan:

## 2013-12-28 NOTE — Assessment & Plan Note (Signed)
New- unclear etiology. Extremity exam normal today and reassuring.  Normal pulses.  Will check TSH.

## 2013-12-28 NOTE — Assessment & Plan Note (Signed)
Well controlled on current rx. No changes made today. 

## 2013-12-29 ENCOUNTER — Other Ambulatory Visit (INDEPENDENT_AMBULATORY_CARE_PROVIDER_SITE_OTHER): Payer: BC Managed Care – PPO

## 2013-12-29 DIAGNOSIS — E781 Pure hyperglyceridemia: Secondary | ICD-10-CM

## 2013-12-29 LAB — LIPID PANEL
Cholesterol: 157 mg/dL (ref 0–200)
HDL: 23.3 mg/dL — ABNORMAL LOW (ref >39.00)
NonHDL: 133.7
Total CHOL/HDL Ratio: 7
Triglycerides: 345 mg/dL — ABNORMAL HIGH (ref 0.0–149.0)
VLDL: 69 mg/dL — ABNORMAL HIGH (ref 0.0–40.0)

## 2013-12-29 LAB — LDL CHOLESTEROL, DIRECT: LDL DIRECT: 59.3 mg/dL

## 2014-01-22 ENCOUNTER — Encounter: Payer: Self-pay | Admitting: Family Medicine

## 2014-02-08 ENCOUNTER — Encounter: Payer: Self-pay | Admitting: Obstetrics & Gynecology

## 2014-02-08 ENCOUNTER — Ambulatory Visit (INDEPENDENT_AMBULATORY_CARE_PROVIDER_SITE_OTHER): Payer: BC Managed Care – PPO | Admitting: Obstetrics & Gynecology

## 2014-02-08 VITALS — BP 132/79 | HR 80 | Ht 64.0 in | Wt 176.2 lb

## 2014-02-08 DIAGNOSIS — Z0000 Encounter for general adult medical examination without abnormal findings: Secondary | ICD-10-CM

## 2014-02-08 DIAGNOSIS — Z01419 Encounter for gynecological examination (general) (routine) without abnormal findings: Secondary | ICD-10-CM

## 2014-02-08 NOTE — Progress Notes (Signed)
Subjective:    Hannah Rocha is a 48 y.o. MW female who presents for an annual exam. The patient has no complaints today. The patient is sexually active. GYN screening history: last pap: was normal. The patient wears seatbelts: yes. The patient participates in regular exercise: yes. (walks 30 minutes per day). Has the patient ever been transfused or tattooed?: no. The patient reports that there is not domestic violence in her life.   Menstrual History: OB History    Gravida Para Term Preterm AB TAB SAB Ectopic Multiple Living   1 0 0 0 1 1 0 0 0 0       Menarche age: 43  Patient's last menstrual period was 03/24/2013.    The following portions of the patient's history were reviewed and updated as appropriate: allergies, current medications, past family history, past medical history, past social history, past surgical history and problem list.  Review of Systems Pertinent items are noted in HPI. Married for 15 years, denies dyspareunia. Works at [employer], counselling). Already got flu vaccine this season. Mammogram 9/15 normal. Dr. Deborra Medina is her FP.   Objective:    BP 132/79 mmHg  Pulse 80  Ht 5\' 4"  (1.626 m)  Wt 176 lb 3.2 oz (79.924 kg)  BMI 30.23 kg/m2  LMP 03/24/2013  General Appearance:    Alert, cooperative, no distress, appears stated age  Head:    Normocephalic, without obvious abnormality, atraumatic  Eyes:    PERRL, conjunctiva/corneas clear, EOM's intact, fundi    benign, both eyes  Ears:    Normal TM's and external ear canals, both ears  Nose:   Nares normal, septum midline, mucosa normal, no drainage    or sinus tenderness  Throat:   Lips, mucosa, and tongue normal; teeth and gums normal  Neck:   Supple, symmetrical, trachea midline, no adenopathy;    thyroid:  no enlargement/tenderness/nodules; no carotid   bruit or JVD  Back:     Symmetric, no curvature, ROM normal, no CVA tenderness  Lungs:     Clear to auscultation  bilaterally, respirations unlabored  Chest Wall:    No tenderness or deformity   Heart:    Regular rate and rhythm, S1 and S2 normal, no murmur, rub   or gallop  Breast Exam:    No tenderness, masses, or nipple abnormality  Abdomen:     Soft, non-tender, bowel sounds active all four quadrants,    no masses, no organomegaly  Genitalia:    Normal female without lesion, discharge or tenderness, no atrophy, good support of her vaginal cuff, no masses or tenderness with bimanual exam     Extremities:   Extremities normal, atraumatic, no cyanosis or edema  Pulses:   2+ and symmetric all extremities  Skin:   Skin color, texture, turgor normal, no rashes or lesions  Lymph nodes:   Cervical, supraclavicular, and axillary nodes normal  Neurologic:   CNII-XII intact, normal strength, sensation and reflexes    throughout  .    Assessment:    Healthy female exam.    Plan:     Rec SBE/SVE monthly

## 2014-02-27 ENCOUNTER — Other Ambulatory Visit (INDEPENDENT_AMBULATORY_CARE_PROVIDER_SITE_OTHER): Payer: BC Managed Care – PPO

## 2014-02-27 DIAGNOSIS — E781 Pure hyperglyceridemia: Secondary | ICD-10-CM

## 2014-02-27 LAB — LIPID PANEL
CHOLESTEROL: 186 mg/dL (ref 0–200)
HDL: 25.9 mg/dL — AB (ref >39.00)
NonHDL: 160.1
TRIGLYCERIDES: 306 mg/dL — AB (ref 0.0–149.0)
Total CHOL/HDL Ratio: 7
VLDL: 61.2 mg/dL — ABNORMAL HIGH (ref 0.0–40.0)

## 2014-02-27 LAB — LDL CHOLESTEROL, DIRECT: Direct LDL: 87 mg/dL

## 2014-03-05 ENCOUNTER — Ambulatory Visit (INDEPENDENT_AMBULATORY_CARE_PROVIDER_SITE_OTHER): Payer: BC Managed Care – PPO | Admitting: Family Medicine

## 2014-03-05 ENCOUNTER — Encounter: Payer: Self-pay | Admitting: Family Medicine

## 2014-03-05 VITALS — BP 134/76 | HR 82 | Temp 98.4°F | Wt 174.8 lb

## 2014-03-05 DIAGNOSIS — M722 Plantar fascial fibromatosis: Secondary | ICD-10-CM

## 2014-03-05 NOTE — Progress Notes (Signed)
Pre visit review using our clinic review tool, if applicable. No additional management support is needed unless otherwise documented below in the visit note. 

## 2014-03-05 NOTE — Progress Notes (Signed)
Subjective:   Patient ID: Hannah Rocha, female    DOB: [birth year], 48 y.o.   MRN: [id number]  Hannah Rocha is a pleasant 48 y.o. year old femaleyear old female who presents to clinic today with Foot Pain  on 03/05/2014  HPI:  2 weeks of progressive bilateral heel pain- worse first few steps of the day. Has been walking more.  Does wear heels to work. Trying to lose weight to bring down her triglycerides. No injury. Wt Readings from Last 3 Encounters:  03/05/14 174 lb 12 oz (79.266 kg)  02/08/14 176 lb 3.2 oz (79.924 kg)  12/28/13 178 lb 8 oz (80.967 kg)   Ibuprofen has been helping a little bit. "just give me a cortisone shot" to make it better.  Current Outpatient Prescriptions on File Prior to Visit  Medication Sig Dispense Refill  . busPIRone (BUSPAR) 15 MG tablet TAKE 1 TABLET BY MOUTH 2 TIMES DAILY. 60 tablet 2  . flintstones complete (FLINTSTONES) 60 MG chewable tablet Chew 1 tablet by mouth daily.    . fluticasone (FLONASE) 50 MCG/ACT nasal spray USE 2 SPRAYS IN EACH NOSTRIL ONCE A DAY 16 g 4  . ibuprofen (ADVIL,MOTRIN) 800 MG tablet Take 1 tablet (800 mg total) by mouth every 8 (eight) hours as needed for pain. 60 tablet 1  . loratadine (ALAVERT) 10 MG tablet Take 10 mg by mouth daily.      . pantoprazole (PROTONIX) 40 MG tablet TAKE 1 TABLET (40 MG TOTAL) BY MOUTH DAILY. 30 tablet 11   No current facility-administered medications on file prior to visit.    Allergies  Allergen Reactions  . Sertraline Hcl     REACTION: vomiting, severe anxiety    Past Medical History  Diagnosis Date  . Allergy   . GERD (gastroesophageal reflux disease)   . Obesity   . PONV (postoperative nausea and vomiting)   . Anxiety   . H/O hiatal hernia   . Anemia     hx    Past Surgical History  Procedure Laterality Date  . Asd repair  1999    [facility]  . Mandible fracture surgery    . Cleft lip repair    . Laparoscopic tubal ligation  03/21/2012    Procedure: LAPAROSCOPIC  TUBAL LIGATION;  Surgeon: Emily Filbert, MD;  Location: [facility];  Service: Gynecology;  Laterality: Bilateral;  . Tubal ligation    . Laparoscopic assisted vaginal hysterectomy Bilateral 04/13/2013    Procedure: LAPAROSCOPIC ASSISTED VAGINAL HYSTERECTOMY;  Surgeon: Emily Filbert, MD;  Location: [facility];  Service: Gynecology;  Laterality: Bilateral;    Family History  Problem Relation Age of Onset  . Heart disease Mother   . Colon polyps Mother   . Prostate cancer Father   . Heart disease Father   . Colon cancer Neg Hx     History   Social History  . Marital Status: Married    Spouse Name: N/A    Number of Children: 0  . Years of Education: N/A   Occupational History  . [employer]    Social History Main Topics  . Smoking status: Former Smoker -- 0.25 packs/day for .5 years    Types: Cigarettes    Quit date: 02/21/1987  . Smokeless tobacco: Never Used     Comment: 22 years ago-light smoker  . Alcohol Use: 0.0 oz/week    0 Not specified per week     Comment: occasional  . Drug Use: No  . Sexual  Activity:    Partners: Male    Patent examiner Protection: Surgical     Comment: tubaligation/hysterctomy   Other Topics Concern  . Not on file   Social History Narrative   Daily caffeine use   The PMH, PSH, Social History, Family History, Medications, and allergies have been reviewed in [facility], and have been updated if relevant.  Review of Systems  Constitutional: Negative.   Musculoskeletal: Negative for back pain.  Skin: Negative.   Neurological: Negative.   All other systems reviewed and are negative.      Objective:    BP 134/76 mmHg  Pulse 82  Temp(Src) 98.4 F (36.9 C) (Oral)  Wt 174 lb 12 oz (79.266 kg)  SpO2 97%  LMP 03/24/2013   Physical Exam  Constitutional: She is oriented to person, place, and time. She appears well-developed.  HENT:  Head: Normocephalic.  Cardiovascular: Normal rate.   Pulmonary/Chest: Effort normal.  Musculoskeletal:        Feet:  Neurological: She is alert and oriented to person, place, and time.  Skin: Skin is warm and dry.  Psychiatric: She has a normal mood and affect. Her behavior is normal. Judgment and thought content normal.  Nursing note and vitals reviewed.         Assessment & Plan:   Plantar fasciitis, bilateral No Follow-up on file.

## 2014-03-05 NOTE — Assessment & Plan Note (Signed)
New- Discussed course and treatment of plantar fascitis. Reviewed the sports med advisor handout with supportive care and exercises. Letter given to pt stating she can wear sneakers to work. Call or return to clinic prn if these symptoms worsen or fail to improve as anticipated. The patient indicates understanding of these issues and agrees with the plan.

## 2014-04-02 ENCOUNTER — Other Ambulatory Visit: Payer: Self-pay | Admitting: Family Medicine

## 2014-04-06 ENCOUNTER — Encounter: Payer: Self-pay | Admitting: Podiatry

## 2014-04-06 ENCOUNTER — Ambulatory Visit (INDEPENDENT_AMBULATORY_CARE_PROVIDER_SITE_OTHER): Payer: BLUE CROSS/BLUE SHIELD

## 2014-04-06 ENCOUNTER — Ambulatory Visit (INDEPENDENT_AMBULATORY_CARE_PROVIDER_SITE_OTHER): Payer: BLUE CROSS/BLUE SHIELD | Admitting: Podiatry

## 2014-04-06 VITALS — BP 112/78 | HR 74 | Resp 16

## 2014-04-06 DIAGNOSIS — M79672 Pain in left foot: Secondary | ICD-10-CM

## 2014-04-06 DIAGNOSIS — M722 Plantar fascial fibromatosis: Secondary | ICD-10-CM

## 2014-04-06 MED ORDER — TRIAMCINOLONE ACETONIDE 10 MG/ML IJ SUSP
10.0000 mg | Freq: Once | INTRAMUSCULAR | Status: AC
  Administered 2014-04-06: 10 mg

## 2014-04-06 MED ORDER — DICLOFENAC SODIUM 75 MG PO TBEC: 75.0000 mg | DELAYED_RELEASE_TABLET | Freq: Two times a day (BID) | ORAL | Status: DC

## 2014-04-06 NOTE — Progress Notes (Signed)
   Subjective:    Patient ID: Hannah Rocha, female    DOB: 21-Jul-1965, 49 y.o.   MRN: [id number]  HPI  Pt presents with bilateral PF pain. Has tried ice, stretching and new shoes.  Review of Systems  All other systems reviewed and are negative.      Objective:   Physical Exam        Assessment & Plan:

## 2014-04-06 NOTE — Patient Instructions (Signed)

## 2014-04-07 NOTE — Progress Notes (Signed)
Subjective:     Patient ID: Hannah Rocha, female   DOB: 07-07-1965, 49 y.o.   MRN: [id number]  HPI patient states she's having a lot of pain in both of her heels of several months duration. States that it's gradually intensified over that time and is making ambulation and any form of physical activity difficult   Review of Systems  All other systems reviewed and are negative.      Objective:   Physical Exam  Constitutional: She is oriented to person, place, and time.  Cardiovascular: Intact distal pulses.   Musculoskeletal: Normal range of motion.  Neurological: She is oriented to person, place, and time.  Skin: Skin is warm.  Nursing note and vitals reviewed.  neurovascular status intact with muscle strength adequate and range of motion subtalar and midtarsal joint within normal limits. Patient is noted to have good digital perfusion is well oriented 3 and has no equinus condition noted. She is noted to have exquisite discomfort plantar aspect heel at the insertion of the tendon into the calcaneus with fluid buildup at the insertion of the fascia into the calcaneus     Assessment:     Plantar fasciitis of both heels of months duration    Plan:     Reviewed condition and also discussed her foot structure which is moderately depressed. At this time I went ahead and I injected the plantar fascia bilateral 3 mg Kenalog 5 mg Xylocaine and applied fascial brace bilateral and placed on diclofenac 75 mg twice a day area gave instructions on physical therapy and shoe gear modification and reappoint in 2 weeks

## 2014-04-27 ENCOUNTER — Ambulatory Visit (INDEPENDENT_AMBULATORY_CARE_PROVIDER_SITE_OTHER): Payer: BLUE CROSS/BLUE SHIELD | Admitting: Podiatry

## 2014-04-27 VITALS — BP 147/75 | HR 87 | Resp 16

## 2014-04-27 DIAGNOSIS — M722 Plantar fascial fibromatosis: Secondary | ICD-10-CM

## 2014-04-27 MED ORDER — TRIAMCINOLONE ACETONIDE 10 MG/ML IJ SUSP
10.0000 mg | Freq: Once | INTRAMUSCULAR | Status: AC
  Administered 2014-04-27: 10 mg

## 2014-04-29 NOTE — Progress Notes (Signed)
Subjective:     Patient ID: Hannah Rocha, female   DOB: 1965-06-10, 49 y.o.   MRN: [id number]  HPI patient states my heels are improved with continued discomfort noted upon prolonged weightbearing but much better than previously   Review of Systems     Objective:   Physical Exam Neurovascular status unchanged with pain in the plantar heel with deep debridement but significantly improved from previously    Assessment:     Improve plantar fasciitis bilateral with moderate depression of the arch    Plan:     Reviewed physical therapy supportive shoes and anti-inflammatories. If symptoms persist we will consider orthotics or other treatment options

## 2014-05-25 ENCOUNTER — Ambulatory Visit (INDEPENDENT_AMBULATORY_CARE_PROVIDER_SITE_OTHER): Payer: BLUE CROSS/BLUE SHIELD | Admitting: Podiatry

## 2014-05-25 DIAGNOSIS — M722 Plantar fascial fibromatosis: Secondary | ICD-10-CM | POA: Diagnosis not present

## 2014-05-26 NOTE — Progress Notes (Signed)
Subjective:     Patient ID: Hannah Rocha, female   DOB: November 20, 1965, 49 y.o.   MRN: [id number]  HPI patient states my heels still hurt to a moderate level. It seems more chronic now and the acute pain seems better but I still get discomfort with ambulation or after periods of sitting   Review of Systems     Objective:   Physical Exam Neurovascular status intact with no health history changes noted and noted to have continued discomfort in the plantar heels bilateral with moderate pain upon ambulation    Assessment:     Plantar fasciitis bilateral still present but improved    Plan:     At this time due to the chronic nature of symptoms I have recommended long-term orthotics and scanned for customized orthotic devices. Patient will be seen back when returned and we'll continue physical therapy and supportive shoe gear

## 2014-05-30 ENCOUNTER — Telehealth: Payer: Self-pay | Admitting: *Deleted

## 2014-05-30 NOTE — Telephone Encounter (Signed)
Left message for patient that we need to rescan her for orthotics

## 2014-05-31 ENCOUNTER — Ambulatory Visit: Payer: BLUE CROSS/BLUE SHIELD

## 2014-06-22 ENCOUNTER — Ambulatory Visit: Payer: BLUE CROSS/BLUE SHIELD

## 2014-06-28 ENCOUNTER — Encounter: Payer: Self-pay | Admitting: Podiatry

## 2014-06-28 NOTE — Patient Instructions (Signed)

## 2014-06-30 ENCOUNTER — Other Ambulatory Visit: Payer: Self-pay | Admitting: Family Medicine

## 2014-07-25 ENCOUNTER — Ambulatory Visit (INDEPENDENT_AMBULATORY_CARE_PROVIDER_SITE_OTHER): Payer: BLUE CROSS/BLUE SHIELD | Admitting: Family Medicine

## 2014-07-25 ENCOUNTER — Encounter: Payer: Self-pay | Admitting: Family Medicine

## 2014-07-25 ENCOUNTER — Ambulatory Visit: Payer: BLUE CROSS/BLUE SHIELD | Admitting: Podiatry

## 2014-07-25 VITALS — BP 122/62 | HR 106 | Temp 99.4°F | Wt 173.0 lb

## 2014-07-25 DIAGNOSIS — M545 Low back pain, unspecified: Secondary | ICD-10-CM | POA: Insufficient documentation

## 2014-07-25 DIAGNOSIS — J069 Acute upper respiratory infection, unspecified: Secondary | ICD-10-CM

## 2014-07-25 LAB — POCT URINALYSIS DIPSTICK
BILIRUBIN UA: NEGATIVE
GLUCOSE UA: NEGATIVE
Ketones, UA: NEGATIVE
LEUKOCYTES UA: NEGATIVE
Nitrite, UA: NEGATIVE
Protein, UA: POSITIVE
RBC UA: NEGATIVE
Spec Grav, UA: 1.025
Urobilinogen, UA: 0.2
pH, UA: 6

## 2014-07-25 MED ORDER — HYDROCOD POLST-CPM POLST ER 10-8 MG/5ML PO SUER: 5.0000 mL | Freq: Every evening | ORAL | Status: DC | PRN

## 2014-07-25 MED ORDER — ALBUTEROL SULFATE HFA 108 (90 BASE) MCG/ACT IN AERS: 2.0000 | INHALATION_SPRAY | RESPIRATORY_TRACT | Status: DC | PRN

## 2014-07-25 MED ORDER — AZITHROMYCIN 250 MG PO TABS: ORAL_TABLET | ORAL | Status: DC

## 2014-07-25 NOTE — Progress Notes (Signed)
Pre visit review using our clinic review tool, if applicable. No additional management support is needed unless otherwise documented below in the visit note. 

## 2014-07-25 NOTE — Progress Notes (Signed)
SUBJECTIVE:  Hannah Rocha is a 49 y.o. female who complains of coryza, congestion, sore throat, dry cough, fever and bilateral ear pressure for 6 days. She denies a history of shortness of breath, vomiting and weakness and denies a history of asthma. Patient denies smoke cigarettes.  Low back has been hurting too- bilaterally. No dysuria.  Drainage and cough presents for several days but just developed a fever yesterday.   Current Outpatient Prescriptions on File Prior to Visit  Medication Sig Dispense Refill  . busPIRone (BUSPAR) 15 MG tablet TAKE 1 TABLET BY MOUTH 2 TIMES DAILY. 60 tablet 1  . diclofenac (VOLTAREN) 75 MG EC tablet Take 1 tablet (75 mg total) by mouth 2 (two) times daily. 50 tablet 2  . flintstones complete (FLINTSTONES) 60 MG chewable tablet Chew 1 tablet by mouth daily.    . fluticasone (FLONASE) 50 MCG/ACT nasal spray USE 2 SPRAYS IN EACH NOSTRIL ONCE A DAY 16 g 4  . ibuprofen (ADVIL,MOTRIN) 800 MG tablet Take 1 tablet (800 mg total) by mouth every 8 (eight) hours as needed for pain. 60 tablet 1  . loratadine (ALAVERT) 10 MG tablet Take 10 mg by mouth daily.      . pantoprazole (PROTONIX) 40 MG tablet TAKE 1 TABLET (40 MG TOTAL) BY MOUTH DAILY. 30 tablet 11   No current facility-administered medications on file prior to visit.    Allergies  Allergen Reactions  . Sertraline Hcl     REACTION: vomiting, severe anxiety    Past Medical History  Diagnosis Date  . Allergy   . GERD (gastroesophageal reflux disease)   . Obesity   . PONV (postoperative nausea and vomiting)   . Anxiety   . H/O hiatal hernia   . Anemia     hx    Past Surgical History  Procedure Laterality Date  . Asd repair  1999    [facility]  . Mandible fracture surgery    . Cleft lip repair    . Laparoscopic tubal ligation  03/21/2012    Procedure: LAPAROSCOPIC TUBAL LIGATION;  Surgeon: Emily Filbert, MD;  Location: [facility];  Service: Gynecology;  Laterality: Bilateral;  . Tubal ligation     . Laparoscopic assisted vaginal hysterectomy Bilateral 04/13/2013    Procedure: LAPAROSCOPIC ASSISTED VAGINAL HYSTERECTOMY;  Surgeon: Emily Filbert, MD;  Location: [facility];  Service: Gynecology;  Laterality: Bilateral;    Family History  Problem Relation Age of Onset  . Heart disease Mother   . Colon polyps Mother   . Prostate cancer Father   . Heart disease Father   . Colon cancer Neg Hx     History   Social History  . Marital Status: Married    Spouse Name: N/A  . Number of Children: 0  . Years of Education: N/A   Occupational History  . [employer]    Social History Main Topics  . Smoking status: Former Smoker -- 0.25 packs/day for .5 years    Types: Cigarettes    Quit date: 02/21/1987  . Smokeless tobacco: Never Used     Comment: 22 years ago-light smoker  . Alcohol Use: 0.0 oz/week    0 Standard drinks or equivalent per week     Comment: occasional  . Drug Use: No  . Sexual Activity:    Partners: Male    Birth Control/ Protection: Surgical     Comment: tubaligation/hysterctomy   Other Topics Concern  . Not on file   Social History Narrative  Daily caffeine use   The PMH, PSH, Social History, Family History, Medications, and allergies have been reviewed in [facility], and have been updated if relevant.  ROS  Review of Systems  Constitutional: Positive for fever and chills.  HENT: Positive for congestion, ear pain, postnasal drip, rhinorrhea and sore throat. Negative for ear discharge, sinus pressure, trouble swallowing and voice change.   Respiratory: Positive for cough and wheezing. Negative for shortness of breath and stridor.   Cardiovascular: Positive for chest pain.  Gastrointestinal: Negative.   Endocrine: Negative.   Genitourinary: Negative.   Musculoskeletal: Positive for back pain.  Allergic/Immunologic: Negative.   Neurological: Negative.   Hematological: Negative.     OBJECTIVE: BP 122/62 mmHg  Pulse 106  Temp(Src) 99.4 F (37.4 C) (Oral)   Wt 173 lb (78.472 kg)  SpO2 99%  LMP 03/24/2013  Physical Exam  Constitutional: She is oriented to person, place, and time and well-developed, well-nourished, and in no distress. No distress.  HENT:  Head: Normocephalic.  Right Ear: Hearing and tympanic membrane normal.  Left Ear: Tympanic membrane is retracted.  Nose: Mucosal edema and rhinorrhea present. Right sinus exhibits no maxillary sinus tenderness and no frontal sinus tenderness. Left sinus exhibits no maxillary sinus tenderness and no frontal sinus tenderness.  Mouth/Throat: Uvula is midline. Posterior oropharyngeal erythema present. No oropharyngeal exudate.  Eyes: Conjunctivae are normal.  Neck: Normal range of motion. Neck supple.  Cardiovascular: Normal rate.   Pulmonary/Chest: Effort normal. She has wheezes.  Abdominal: Soft. Bowel sounds are normal.  Musculoskeletal:  No CVA tenderness  Neurological: She is alert and oriented to person, place, and time.  Skin: Skin is warm and dry.  Psychiatric: Mood, memory, affect and judgment normal.  Nursing note and vitals reviewed.

## 2014-07-25 NOTE — Patient Instructions (Signed)
Good to see you. Please take zpack as directed.  Proair inhaler and tussionex as needed.  Stay hydrated and keep me updated.

## 2014-07-25 NOTE — Assessment & Plan Note (Signed)
New-likely due to coughing. Lower back. UA neg. tussionex prn will hopefully help with this.

## 2014-07-25 NOTE — Assessment & Plan Note (Signed)
History and exam consistent with bronchitis. Treat with zpack, prn albuterol and tussionex. Call or return to clinic prn if these symptoms worsen or fail to improve as anticipated. The patient indicates understanding of these issues and agrees with the plan.

## 2014-07-27 ENCOUNTER — Ambulatory Visit: Payer: BLUE CROSS/BLUE SHIELD

## 2014-07-27 LAB — URINE CULTURE
Colony Count: NO GROWTH
Organism ID, Bacteria: NO GROWTH

## 2014-07-31 ENCOUNTER — Ambulatory Visit (INDEPENDENT_AMBULATORY_CARE_PROVIDER_SITE_OTHER): Payer: BLUE CROSS/BLUE SHIELD | Admitting: Podiatry

## 2014-07-31 ENCOUNTER — Encounter: Payer: Self-pay | Admitting: Podiatry

## 2014-07-31 ENCOUNTER — Ambulatory Visit: Payer: BLUE CROSS/BLUE SHIELD

## 2014-07-31 VITALS — BP 140/80 | HR 89 | Resp 15

## 2014-07-31 DIAGNOSIS — M722 Plantar fascial fibromatosis: Secondary | ICD-10-CM | POA: Diagnosis not present

## 2014-07-31 MED ORDER — TRIAMCINOLONE ACETONIDE 10 MG/ML IJ SUSP
10.0000 mg | Freq: Once | INTRAMUSCULAR | Status: AC
  Administered 2014-07-31: 10 mg

## 2014-08-01 NOTE — Progress Notes (Signed)
Subjective:     Patient ID: Hannah Rocha, female   DOB: [birth year], 49 y.o.   MRN: [id number]  HPI patient states that my left heel has been bothering me and that patient admits she's not been doing her stretching or physical therapy or shoe gear modifications as we had wanted. Orthotics are fitted well   Review of Systems     Objective:   Physical Exam Neurovascular status intact with quite a bit of discomfort plantar aspect left heel at the insertional point the tendon into the calcaneus    Assessment:     Plantar fasciitis left with inflammation    Plan:     Advised on physical therapy and I reinjected the plantar fascia 3 mg Kenalog 5 mg Xylocaine and went ahead today and placed patient into a night splint with instructions on usage. Reappoint to recheck in 4 weeks

## 2014-08-27 ENCOUNTER — Encounter: Payer: Self-pay | Admitting: Podiatry

## 2014-08-27 ENCOUNTER — Ambulatory Visit (INDEPENDENT_AMBULATORY_CARE_PROVIDER_SITE_OTHER): Payer: BLUE CROSS/BLUE SHIELD | Admitting: Podiatry

## 2014-08-27 DIAGNOSIS — M722 Plantar fascial fibromatosis: Secondary | ICD-10-CM

## 2014-08-27 DIAGNOSIS — M79672 Pain in left foot: Secondary | ICD-10-CM

## 2014-08-29 ENCOUNTER — Other Ambulatory Visit: Payer: Self-pay | Admitting: Family Medicine

## 2014-08-30 NOTE — Progress Notes (Signed)
Subjective:     Patient ID: Hannah Rocha, female   DOB: 03-Jun-1965, 49 y.o.   MRN: [id number]  HPI patient presents stating my left heel is still bothering me quite a bit and it simply not getting better with treatment   Review of Systems     Objective:   Physical Exam Neurovascular status intact with continued discomfort left plantar heel at the insertional point the tendon the calcaneus and also patient is slated to go out of town    Assessment:     Plantar fasciitis left with inflammation and pain with patient due to go out of town    Plan:     Reviewed treatment options and at this time we will initiate shockwave therapy the next several weeks she will reappoint one week we'll probably do 1 more injection wire to her going out of town and then initiate shockwave

## 2014-09-12 ENCOUNTER — Ambulatory Visit (INDEPENDENT_AMBULATORY_CARE_PROVIDER_SITE_OTHER): Payer: BLUE CROSS/BLUE SHIELD | Admitting: Podiatry

## 2014-09-12 DIAGNOSIS — M722 Plantar fascial fibromatosis: Secondary | ICD-10-CM | POA: Diagnosis not present

## 2014-09-12 MED ORDER — TRIAMCINOLONE ACETONIDE 10 MG/ML IJ SUSP
10.0000 mg | Freq: Once | INTRAMUSCULAR | Status: AC
Start: 2014-09-12 — End: 2014-09-12
  Administered 2014-09-12: 10 mg

## 2014-09-12 NOTE — Progress Notes (Signed)
Subjective:     Patient ID: Hannah Rocha, female   DOB: [birth year], 49 y.o.   MRN: [id number]  HPI patient presents for treatment of the left heel and also states that her right heel is becoming very tender and is making it hard to walk   Review of Systems     Objective:   Physical Exam Neurovascular status unchanged with pain in the right and left plantar fascia at the insertion of the tendon into the calcaneus    Assessment:     Plantar fasciitis bilateral with inflammation especially in the medial band right at this time    Plan:     For the left with initiated shockwave and I was able to get her to 4.0 on intensity 3000 shocks 17 frequency and I injected the right plantar fascia 3 Milligan Kenalog 5 mill grams Xylocaine and we will see response and may require shockwave for right. Reappoint in 1 week

## 2014-09-19 ENCOUNTER — Ambulatory Visit (INDEPENDENT_AMBULATORY_CARE_PROVIDER_SITE_OTHER): Payer: BLUE CROSS/BLUE SHIELD | Admitting: Podiatry

## 2014-09-19 DIAGNOSIS — M722 Plantar fascial fibromatosis: Secondary | ICD-10-CM

## 2014-09-19 NOTE — Progress Notes (Signed)
Subjective:     Patient ID: Hannah Rocha, female   DOB: Jul 24, 1965, 49 y.o.   MRN: [id number]  HPI patient presents stating I do believe I'm improving and my other heel seems okay at this time   Review of Systems     Objective:   Physical Exam Neurovascular status intact with discomfort in the plantar left heel which is present but improved    Assessment:     Plantar fasciitis left improving    Plan:     Shockwave obtained to the area tolerated well and will be seen back for recheck I was able to get her to 4.6 today 3,000 shocks 17 frequency

## 2014-09-26 ENCOUNTER — Ambulatory Visit (INDEPENDENT_AMBULATORY_CARE_PROVIDER_SITE_OTHER): Payer: BLUE CROSS/BLUE SHIELD | Admitting: Podiatry

## 2014-09-26 ENCOUNTER — Encounter: Payer: Self-pay | Admitting: Podiatry

## 2014-09-26 VITALS — BP 89/61 | HR 74 | Resp 15

## 2014-09-26 DIAGNOSIS — M722 Plantar fascial fibromatosis: Secondary | ICD-10-CM

## 2014-09-26 NOTE — Progress Notes (Signed)
Subjective:     Patient ID: Hannah Rocha, female   DOB: 03/29/1965, 49 y.o.   MRN: [id number]  HPI patient states my heel is feeling quite a bit better and the right one is bothering me a little bit but not severe   Review of Systems     Objective:   Physical Exam Neurovascular status intact with discomfort in the plantar aspect left heel of a minimal nature on the medial and central side and the right heel moderately tender when palpate pland    Assessment:     Plantar fasciitis improving left and present to a mild degree right    Plan:     Shockwave administered left 3000 shocks at 4.7 intensity 17 frequency and then 1000 shocks to the arch and reappoint 6 weeks for reevaluation and possible treatment of the right if it were to get back

## 2014-10-28 ENCOUNTER — Other Ambulatory Visit: Payer: Self-pay | Admitting: Family Medicine

## 2014-11-07 ENCOUNTER — Encounter: Payer: Self-pay | Admitting: Podiatry

## 2014-11-07 ENCOUNTER — Ambulatory Visit (INDEPENDENT_AMBULATORY_CARE_PROVIDER_SITE_OTHER): Payer: BLUE CROSS/BLUE SHIELD | Admitting: Podiatry

## 2014-11-07 DIAGNOSIS — M722 Plantar fascial fibromatosis: Secondary | ICD-10-CM

## 2014-11-07 NOTE — Progress Notes (Signed)
Subjective:     Patient ID: Hannah Rocha, female   DOB: 02-Apr-1965, 49 y.o.   MRN: [id number]  HPI patient presents stating my heel is feeling better but still some soreness at the end of the day   Review of Systems     Objective:   Physical Exam  neurovascular status intact muscle strength adequate with soreness in the left heel that's improved dramatically    Assessment:      improved plantar fasciitis    Plan:      shockwave 3000 shocks at 4.8 intensity 17 frequency and 1000 shocks to the left arch tolerated well by patient and discharge at this time

## 2014-12-03 ENCOUNTER — Encounter: Payer: Self-pay | Admitting: Family Medicine

## 2014-12-03 ENCOUNTER — Ambulatory Visit (INDEPENDENT_AMBULATORY_CARE_PROVIDER_SITE_OTHER): Payer: BLUE CROSS/BLUE SHIELD | Admitting: Family Medicine

## 2014-12-03 VITALS — BP 134/62 | HR 80 | Temp 97.9°F | Wt 178.0 lb

## 2014-12-03 DIAGNOSIS — Z23 Encounter for immunization: Secondary | ICD-10-CM

## 2014-12-03 DIAGNOSIS — F419 Anxiety disorder, unspecified: Secondary | ICD-10-CM | POA: Diagnosis not present

## 2014-12-03 DIAGNOSIS — M7711 Lateral epicondylitis, right elbow: Secondary | ICD-10-CM | POA: Insufficient documentation

## 2014-12-03 DIAGNOSIS — M7712 Lateral epicondylitis, left elbow: Secondary | ICD-10-CM | POA: Diagnosis not present

## 2014-12-03 MED ORDER — BUSPIRONE HCL 15 MG PO TABS: 15.0000 mg | ORAL_TABLET | Freq: Three times a day (TID) | ORAL | Status: DC

## 2014-12-03 NOTE — Assessment & Plan Note (Signed)
New- likely caused by new arm angle at work with her new chairs. Given handout from sports med advisor with supportive care and exercises.  Note written for her employer asking for change in her work station. Call or return to clinic prn if these symptoms worsen or fail to improve as anticipated. The patient indicates understanding of these issues and agrees with the plan.

## 2014-12-03 NOTE — Patient Instructions (Signed)
Good to see you. Please try the exercises given to you today, pick up some elbow straps at your pharmacy.  Talk to your employer about new chairs.  We are increasing your buspar to 15 mg three times daily.  Please keep me updated.

## 2014-12-03 NOTE — Progress Notes (Signed)
Pre visit review using our clinic review tool, if applicable. No additional management support is needed unless otherwise documented below in the visit note. 

## 2014-12-03 NOTE — Addendum Note (Signed)
Addended by: Cloyd Stagers B on: 12/03/2014 08:58 AM   Modules accepted: Orders

## 2014-12-03 NOTE — Progress Notes (Signed)
Subjective:   Patient ID: Hannah Rocha, female    DOB: 1965/06/29, 49 y.o.   MRN: [id number]  Hannah Rocha is a pleasant 49 y.o. year old female who presents to clinic today with Elbow Pain  and worsening anxiety on 12/03/2014  HPI: Bilateral elbow pain- Started a few weeks ago after she got new chairs at work that have arm rests.  Shoots from her elbow to her mid arm bilaterally.  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  No UE weakness.  Tried otc "copper" mits without improvement of symptoms. Has not tried anything for it.  Does seem a little better on weekends.  No UE weakness or decreased grip strength.  No tingling in her hands or fingers.  Has never had anything like this in past.  Anxiety- has been taking Buspar 15 mg twice daily and symptoms have been well controlled until recently.  Has more responsibility and stressors at work.  Feels herself getting more anxious during the day.  Sleeping ok.  Appetite good. No SI or HI. Wt Readings from Last 3 Encounters:  12/03/14 178 lb (80.74 kg)  07/25/14 173 lb (78.472 kg)  03/05/14 174 lb 12 oz (79.266 kg)   Current Outpatient Prescriptions on File Prior to Visit  Medication Sig Dispense Refill  . flintstones complete (FLINTSTONES) 60 MG chewable tablet Chew 1 tablet by mouth daily.    . fluticasone (FLONASE) 50 MCG/ACT nasal spray USE 2 SPRAYS IN EACH NOSTRIL ONCE A DAY 16 g 4  . ibuprofen (ADVIL,MOTRIN) 800 MG tablet Take 1 tablet (800 mg total) by mouth every 8 (eight) hours as needed for pain. 60 tablet 1  . loratadine (ALAVERT) 10 MG tablet Take 10 mg by mouth daily.      . pantoprazole (PROTONIX) 40 MG tablet TAKE 1 TABLET (40 MG TOTAL) BY MOUTH DAILY. 30 tablet 11  . albuterol (PROVENTIL HFA;VENTOLIN HFA) 108 (90 BASE) MCG/ACT inhaler Inhale 2 puffs into the lungs every 4 (four) hours as needed. (Patient not taking: Reported on 12/03/2014) 1 Inhaler 0  . azithromycin (ZITHROMAX) 250 MG tablet Take two  tablets on day one, then one tablet on days 2-5 (Patient not taking: Reported on 09/26/2014) 6 each 0  . chlorpheniramine-HYDROcodone (TUSSIONEX PENNKINETIC ER) 10-8 MG/5ML SUER Take 5 mLs by mouth at bedtime as needed for cough. (Patient not taking: Reported on 12/03/2014) 140 mL 0  . diclofenac (VOLTAREN) 75 MG EC tablet Take 1 tablet (75 mg total) by mouth 2 (two) times daily. (Patient not taking: Reported on 12/03/2014) 50 tablet 2   No current facility-administered medications on file prior to visit.    Allergies  Allergen Reactions  . Sertraline Hcl     REACTION: vomiting, severe anxiety    Past Medical History  Diagnosis Date  . Allergy   . GERD (gastroesophageal reflux disease)   . Obesity   . PONV (postoperative nausea and vomiting)   . Anxiety   . H/O hiatal hernia   . Anemia     hx    Past Surgical History  Procedure Laterality Date  . Asd repair  1999    [facility]  . Mandible fracture surgery    . Cleft lip repair    . Laparoscopic tubal ligation  03/21/2012    Procedure: LAPAROSCOPIC TUBAL LIGATION;  Surgeon: Emily Filbert, MD;  Location: [facility];  Service: Gynecology;  Laterality: Bilateral;  . Tubal ligation    . Laparoscopic assisted  vaginal hysterectomy Bilateral 04/13/2013    Procedure: LAPAROSCOPIC ASSISTED VAGINAL HYSTERECTOMY;  Surgeon: Emily Filbert, MD;  Location: [facility];  Service: Gynecology;  Laterality: Bilateral;    Family History  Problem Relation Age of Onset  . Heart disease Mother   . Colon polyps Mother   . Prostate cancer Father   . Heart disease Father   . Colon cancer Neg Hx     Social History   Social History  . Marital Status: Married    Spouse Name: N/A  . Number of Children: 0  . Years of Education: N/A   Occupational History  . [employer]    Social History Main Topics  . Smoking status: Former Smoker -- 0.25 packs/day for .5 years    Types: Cigarettes    Quit date: 02/21/1987  . Smokeless tobacco: Never Used     Comment: 22  years ago-light smoker  . Alcohol Use: 0.0 oz/week    0 Standard drinks or equivalent per week     Comment: occasional  . Drug Use: No  . Sexual Activity:    Partners: Male    Birth Control/ Protection: Surgical     Comment: tubaligation/hysterctomy   Other Topics Concern  . Not on file   Social History Narrative   Daily caffeine use   The PMH, PSH, Social History, Family History, Medications, and allergies have been reviewed in [facility], and have been updated if relevant.   Review of Systems  Constitutional: Negative.   HENT: Negative.   Eyes: Negative.   Respiratory: Negative.   Cardiovascular: Negative.   Gastrointestinal: Negative.  Negative for abdominal distention.  Endocrine: Negative.   Genitourinary: Negative.   Musculoskeletal: Positive for myalgias.  Allergic/Immunologic: Negative.   Neurological: Negative.   Hematological: Negative.   Psychiatric/Behavioral: Negative for suicidal ideas, sleep disturbance and self-injury. The patient is nervous/anxious.   All other systems reviewed and are negative.      Objective:    BP 134/62 mmHg  Pulse 80  Temp(Src) 97.9 F (36.6 C) (Oral)  Wt 178 lb (80.74 kg)  SpO2 97%  LMP 03/24/2013   Physical Exam  Constitutional: She is oriented to person, place, and time. She appears well-developed and well-nourished. No distress.  HENT:  Head: Normocephalic.  Eyes: Conjunctivae are normal.  Cardiovascular: Normal rate.   Pulmonary/Chest: Effort normal.  Musculoskeletal: She exhibits no edema.  Localized tenderness over the lateral epicondyle and proximal wrist extensor muscle mass bilaterally  Neurological: She is alert and oriented to person, place, and time. No cranial nerve deficit.  Skin: Skin is warm and dry.  Psychiatric: She has a normal mood and affect. Her behavior is normal. Judgment and thought content normal.  Nursing note and vitals reviewed.         Assessment & Plan:   Bilateral tennis  elbow  Anxiety No Follow-up on file.

## 2014-12-03 NOTE — Assessment & Plan Note (Signed)
Deteriorated. Increase Buspar to 15 mg three times daily. Call or return to clinic prn if these symptoms worsen or fail to improve as anticipated. The patient indicates understanding of these issues and agrees with the plan.

## 2014-12-23 ENCOUNTER — Other Ambulatory Visit: Payer: Self-pay | Admitting: Family Medicine

## 2014-12-31 ENCOUNTER — Ambulatory Visit (INDEPENDENT_AMBULATORY_CARE_PROVIDER_SITE_OTHER): Payer: BLUE CROSS/BLUE SHIELD | Admitting: Family Medicine

## 2014-12-31 ENCOUNTER — Encounter: Payer: Self-pay | Admitting: Family Medicine

## 2014-12-31 VITALS — BP 132/72 | HR 78 | Temp 98.6°F | Wt 184.0 lb

## 2014-12-31 DIAGNOSIS — F419 Anxiety disorder, unspecified: Secondary | ICD-10-CM | POA: Diagnosis not present

## 2014-12-31 MED ORDER — BUSPIRONE HCL 15 MG PO TABS: 15.0000 mg | ORAL_TABLET | Freq: Three times a day (TID) | ORAL | Status: DC

## 2014-12-31 NOTE — Progress Notes (Signed)
Subjective:   Patient ID: Hannah Rocha, female    DOB: 03/03/1966, 49 y.o.   MRN: [id number]  Hannah Rocha is a pleasant 49 y.o. year old female who presents to clinic today with Follow-up  and worsening anxiety on 12/31/2014  HPI:   Anxiety- has been taking Buspar 15 mg twice daily and symptoms have been well controlled until recently. Increased Buspar to 15 mg three times daily last month but insurance would not pay for new rx yet.  Still only taking it twice daily. Has more responsibility and stressors at work.  Feels herself getting more anxious during the day.  Sleeping ok.  Appetite good.  Upset she is gaining weight. No SI or HI. Wt Readings from Last 3 Encounters:  12/31/14 184 lb (83.462 kg)  12/03/14 178 lb (80.74 kg)  07/25/14 173 lb (78.472 kg)   Current Outpatient Prescriptions on File Prior to Visit  Medication Sig Dispense Refill  . flintstones complete (FLINTSTONES) 60 MG chewable tablet Chew 1 tablet by mouth daily.    . fluticasone (FLONASE) 50 MCG/ACT nasal spray USE 2 SPRAYS IN EACH NOSTRIL ONCE A DAY 16 g 4  . ibuprofen (ADVIL,MOTRIN) 800 MG tablet Take 1 tablet (800 mg total) by mouth every 8 (eight) hours as needed for pain. 60 tablet 1  . loratadine (ALAVERT) 10 MG tablet Take 10 mg by mouth daily.      . pantoprazole (PROTONIX) 40 MG tablet TAKE 1 TABLET (40 MG TOTAL) BY MOUTH DAILY. 30 tablet 11   No current facility-administered medications on file prior to visit.    Allergies  Allergen Reactions  . Sertraline Hcl     REACTION: vomiting, severe anxiety    Past Medical History  Diagnosis Date  . Allergy   . GERD (gastroesophageal reflux disease)   . Obesity   . PONV (postoperative nausea and vomiting)   . Anxiety   . H/O hiatal hernia   . Anemia     hx    Past Surgical History  Procedure Laterality Date  . Asd repair  1999    [facility]  . Mandible fracture surgery    . Cleft lip repair    . Laparoscopic tubal ligation   03/21/2012    Procedure: LAPAROSCOPIC TUBAL LIGATION;  Surgeon: Emily Filbert, MD;  Location: [facility];  Service: Gynecology;  Laterality: Bilateral;  . Tubal ligation    . Laparoscopic assisted vaginal hysterectomy Bilateral 04/13/2013    Procedure: LAPAROSCOPIC ASSISTED VAGINAL HYSTERECTOMY;  Surgeon: Emily Filbert, MD;  Location: [facility];  Service: Gynecology;  Laterality: Bilateral;    Family History  Problem Relation Age of Onset  . Heart disease Mother   . Colon polyps Mother   . Prostate cancer Father   . Heart disease Father   . Colon cancer Neg Hx     Social History   Social History  . Marital Status: Married    Spouse Name: N/A  . Number of Children: 0  . Years of Education: N/A   Occupational History  . [employer]    Social History Main Topics  . Smoking status: Former Smoker -- 0.25 packs/day for .5 years    Types: Cigarettes    Quit date: 02/21/1987  . Smokeless tobacco: Never Used     Comment: 22 years ago-light smoker  . Alcohol Use: 0.0 oz/week    0 Standard drinks or equivalent per week     Comment: occasional  . Drug Use: No  .  Sexual Activity:    Partners: Male    Patent examiner Protection: Surgical     Comment: tubaligation/hysterctomy   Other Topics Concern  . Not on file   Social History Narrative   Daily caffeine use   The PMH, PSH, Social History, Family History, Medications, and allergies have been reviewed in [facility], and have been updated if relevant.   Review of Systems  Constitutional: Negative.   HENT: Negative.   Eyes: Negative.   Respiratory: Negative.   Cardiovascular: Negative.   Gastrointestinal: Negative.  Negative for abdominal distention.  Endocrine: Negative.   Genitourinary: Negative.   Musculoskeletal: Negative for myalgias.  Allergic/Immunologic: Negative.   Neurological: Negative.   Hematological: Negative.   Psychiatric/Behavioral: Negative for suicidal ideas, sleep disturbance and self-injury. The patient is  nervous/anxious.   All other systems reviewed and are negative.      Objective:    BP 132/72 mmHg  Pulse 78  Temp(Src) 98.6 F (37 C) (Oral)  Wt 184 lb (83.462 kg)  SpO2 98%  LMP 03/24/2013   Physical Exam  Constitutional: She is oriented to person, place, and time. She appears well-developed and well-nourished. No distress.  HENT:  Head: Normocephalic.  Eyes: Conjunctivae are normal.  Cardiovascular: Normal rate.   Pulmonary/Chest: Effort normal.  Musculoskeletal: She exhibits no edema.  Neurological: She is alert and oriented to person, place, and time. No cranial nerve deficit.  Skin: Skin is warm and dry.  Psychiatric: She has a normal mood and affect. Her behavior is normal. Judgment and thought content normal.  Nursing note and vitals reviewed.         Assessment & Plan:   Anxiety No Follow-up on file.

## 2014-12-31 NOTE — Progress Notes (Signed)
Pre visit review using our clinic review tool, if applicable. No additional management support is needed unless otherwise documented below in the visit note. 

## 2014-12-31 NOTE — Assessment & Plan Note (Signed)
Deteriorated. She will go pick up new rx for buspar 15 mg three times daily and update me in 2 weeks. The patient indicates understanding of these issues and agrees with the plan. >15 minutes spent in face to face time with patient, >50% spent in counselling or coordination of care

## 2015-01-08 ENCOUNTER — Other Ambulatory Visit: Payer: Self-pay | Admitting: Gastroenterology

## 2015-01-14 DIAGNOSIS — D239 Other benign neoplasm of skin, unspecified: Secondary | ICD-10-CM

## 2015-01-14 HISTORY — DX: Other benign neoplasm of skin, unspecified: D23.9

## 2015-01-21 ENCOUNTER — Encounter: Payer: Self-pay | Admitting: Podiatry

## 2015-01-21 ENCOUNTER — Ambulatory Visit (INDEPENDENT_AMBULATORY_CARE_PROVIDER_SITE_OTHER): Payer: BLUE CROSS/BLUE SHIELD | Admitting: Podiatry

## 2015-01-21 VITALS — BP 127/78 | HR 94 | Resp 16

## 2015-01-21 DIAGNOSIS — M722 Plantar fascial fibromatosis: Secondary | ICD-10-CM

## 2015-01-21 NOTE — Patient Instructions (Signed)
Pre-Operative Instructions  Congratulations, you have decided to take an important step to improving your quality of life.  You can be assured that the doctors of [facility] will be with you every step of the way.  1. Plan to be at the surgery center/hospital at least 1 (one) hour prior to your scheduled time unless otherwise directed by the surgical center/hospital staff.  You must have a responsible adult accompany you, remain during the surgery and drive you home.  Make sure you have directions to the surgical center/hospital and know how to get there on time. 2. For hospital based surgery you will need to obtain a history and physical form from your family physician within 1 month prior to the date of surgery- we will give you a form for you primary physician.  3. We make every effort to accommodate the date you request for surgery.  There are however, times where surgery dates or times have to be moved.  We will contact you as soon as possible if a change in schedule is required.   4. No Aspirin/Ibuprofen for one week before surgery.  If you are on aspirin, any non-steroidal anti-inflammatory medications (Mobic, Aleve, Ibuprofen) you should stop taking it 7 days prior to your surgery.  You make take Tylenol  For pain prior to surgery.  5. Medications- If you are taking daily heart and blood pressure medications, seizure, reflux, allergy, asthma, anxiety, pain or diabetes medications, make sure the surgery center/hospital is aware before the day of surgery so they may notify you which medications to take or avoid the day of surgery. 6. No food or drink after midnight the night before surgery unless directed otherwise by surgical center/hospital staff. 7. No alcoholic beverages 24 hours prior to surgery.  No smoking 24 hours prior to or 24 hours after surgery. 8. Wear loose pants or shorts- loose enough to fit over bandages, boots, and casts. 9. No slip on shoes, sneakers are best. 10. Bring  your boot with you to the surgery center/hospital.  Also bring crutches or a Belizaire if your physician has prescribed it for you.  If you do not have this equipment, it will be provided for you after surgery. 11. If you have not been contracted by the surgery center/hospital by the day before your surgery, call to confirm the date and time of your surgery. 12. Leave-time from work may vary depending on the type of surgery you have.  Appropriate arrangements should be made prior to surgery with your employer. 13. Prescriptions will be provided immediately following surgery by your doctor.  Have these filled as soon as possible after surgery and take the medication as directed. 14. Remove nail polish on the operative foot. 15. Wash the night before surgery.  The night before surgery wash the foot and leg well with the antibacterial soap provided and water paying special attention to beneath the toenails and in between the toes.  Rinse thoroughly with water and dry well with a towel.  Perform this wash unless told not to do so by your physician.  Enclosed: 1 Ice pack (please put in freezer the night before surgery)   1 Hibiclens skin cleaner   Pre-op Instructions  If you have any questions regarding the instructions, do not hesitate to call our office.  [place]: [address] [place], [place] [address] [phone]  [place]: [address] [place], [place] [address] [phone]  [place]: [address]  , [place] [address] [phone]  Dr. Richard   Tuchman DPM, Dr. Norman Regal DPM Dr. Richard Sikora DPM, Dr. M. Todd Hyatt DPM, Dr. Kathryn Egerton DPM 

## 2015-01-22 HISTORY — PX: OTHER SURGICAL HISTORY: SHX169

## 2015-01-23 NOTE — Progress Notes (Signed)
Subjective:     Patient ID: Hannah Rocha, female   DOB: [birth year], 49 y.o.   MRN: [id number]  HPI patient states I'm still having some much pain in my heel and I've tried all the different ways to get it better without relief   Review of Systems     Objective:   Physical Exam Neurovascular status intact with intense discomfort plantar fascial bilateral left slightly over right with fluid buildup around the medial band depression of the arch and failure to respond to numerous treatments we've utilized    Assessment:     Continued acute plantar fasciitis left over right    Plan:     H&P and conditions reviewed with patient. Today I went ahead and I discussed the long-term nature of this condition for her to respond to conservative care and treatment options available. Patient is opted for surgery due to long-standing nature and severe discomfort and I allowed her to review consent form reviewing alternative treatments and complications. Patient wants surgery understanding risk and no still recovery can take 6 months to one year for this type procedure and is scheduled for outpatient surgery after questions were answered patient will be seen back for surgical intervention at surgical center

## 2015-02-04 ENCOUNTER — Other Ambulatory Visit: Payer: Self-pay | Admitting: Gastroenterology

## 2015-02-05 ENCOUNTER — Encounter: Payer: Self-pay | Admitting: Podiatry

## 2015-02-05 DIAGNOSIS — M722 Plantar fascial fibromatosis: Secondary | ICD-10-CM | POA: Diagnosis not present

## 2015-02-07 ENCOUNTER — Telehealth: Payer: Self-pay | Admitting: *Deleted

## 2015-02-07 NOTE — Telephone Encounter (Signed)
Pt states she has questions about 02/05/2015 surgery.  Pt asked if it was okay to walk back and forth from the refridgerator to get her ice packs every hour, I told her that would be fine as long as she had her boot on, and she must sleep in the boot.  I told pt she should only be on the surgery foot about 5 min/hr, and if longer occasionally, she would want to keep it closer to the 5 min/hr the next hour to prevent swelling and pain.  Pt states understanding.

## 2015-02-11 ENCOUNTER — Ambulatory Visit (INDEPENDENT_AMBULATORY_CARE_PROVIDER_SITE_OTHER): Payer: BLUE CROSS/BLUE SHIELD | Admitting: Gastroenterology

## 2015-02-11 ENCOUNTER — Ambulatory Visit (INDEPENDENT_AMBULATORY_CARE_PROVIDER_SITE_OTHER): Payer: BLUE CROSS/BLUE SHIELD | Admitting: Podiatry

## 2015-02-11 ENCOUNTER — Encounter: Payer: Self-pay | Admitting: Gastroenterology

## 2015-02-11 ENCOUNTER — Ambulatory Visit: Payer: BLUE CROSS/BLUE SHIELD | Admitting: Obstetrics & Gynecology

## 2015-02-11 VITALS — BP 122/82 | HR 88 | Ht 64.0 in | Wt 178.0 lb

## 2015-02-11 DIAGNOSIS — K219 Gastro-esophageal reflux disease without esophagitis: Secondary | ICD-10-CM | POA: Diagnosis not present

## 2015-02-11 DIAGNOSIS — M722 Plantar fascial fibromatosis: Secondary | ICD-10-CM

## 2015-02-11 DIAGNOSIS — Z1211 Encounter for screening for malignant neoplasm of colon: Secondary | ICD-10-CM | POA: Diagnosis not present

## 2015-02-11 DIAGNOSIS — Z9889 Other specified postprocedural states: Secondary | ICD-10-CM

## 2015-02-11 MED ORDER — PANTOPRAZOLE SODIUM 40 MG PO TBEC: 40.0000 mg | DELAYED_RELEASE_TABLET | Freq: Every day | ORAL | Status: DC

## 2015-02-11 NOTE — Progress Notes (Signed)
    History of Present Illness: This is a 49 year old female with a history of GERD returning for follow-up. Reflux symptoms are well controlled on pantoprazole 40 mg daily. She was previously followed by Dr. Verl Blalock and underwent EGD and colonoscopy in April 2011. Colonoscopy was normal. EGD showed a small hiatal hernia and benign gastric fundic polyps. Colonoscopy was performed at age 55 due to a first degree relative with adenomatous colon polyps and, given that her colonoscopy was normal, a 10 year colonoscopy was recommended.  Current Medications, Allergies, Past Medical History, Past Surgical History, Family History and Social History were reviewed in [employer] record.  Physical Exam: General: Well developed, well nourished, no acute distress Head: Normocephalic and atraumatic Eyes:  sclerae anicteric, EOMI Ears: Normal auditory acuity Mouth: No deformity or lesions Lungs: Clear throughout to auscultation Heart: Regular rate and rhythm; no murmurs, rubs or bruits Abdomen: Soft, non tender and non distended. No masses, hepatosplenomegaly or hernias noted. Normal Bowel sounds Musculoskeletal: Symmetrical with no gross deformities  Pulses:  Normal pulses noted Extremities: No clubbing, cyanosis, edema or deformities noted Neurological: Alert oriented x 4, grossly nonfocal Psychological:  Alert and cooperative. Normal mood and affect  Assessment and Recommendations:  1. GERD. Continue standard antireflux measures and pantoprazole 40 mg daily.  2. CRC screening. Colonoscopy recommended in April 2021.

## 2015-02-11 NOTE — Progress Notes (Signed)
Subjective:     Patient ID: Hannah Rocha, female   DOB: Nov 28, 1965, 49 y.o.   MRN: [id number]  HPI patient presents with the left heel that is doing quite a bit better with surgery with minimal discomfort or swelling and states that she needs to have the right heel fixed   Review of Systems     Objective:   Physical Exam Neurovascular status intact patient has well coapted incision sites medial lateral aspect left heel with minimal discomfort and exquisite discomfort plantar aspect right heel    Assessment:     Doing well post endoscopic surgery left with chronic heel pain right    Plan:     Reviewed both conditions and at this time reapplied sterile dressing left reappoint 2 weeks for suture removal and tentatively scheduled for endoscopic surgery right in 3 weeks

## 2015-02-11 NOTE — Patient Instructions (Signed)
We have sent the following medications to your pharmacy for you to pick up at your convenience: Protonix 40 mg 1 daily.  Thank you for choosing me and [facility].  Pricilla Riffle. Dagoberto Ligas., MD., Marval Regal

## 2015-02-12 ENCOUNTER — Ambulatory Visit (INDEPENDENT_AMBULATORY_CARE_PROVIDER_SITE_OTHER): Payer: BLUE CROSS/BLUE SHIELD | Admitting: Obstetrics & Gynecology

## 2015-02-12 ENCOUNTER — Encounter: Payer: Self-pay | Admitting: Obstetrics & Gynecology

## 2015-02-12 VITALS — BP 127/83 | HR 85 | Resp 18 | Ht 64.0 in | Wt 178.0 lb

## 2015-02-12 DIAGNOSIS — Z0000 Encounter for general adult medical examination without abnormal findings: Secondary | ICD-10-CM

## 2015-02-12 DIAGNOSIS — Z01419 Encounter for gynecological examination (general) (routine) without abnormal findings: Secondary | ICD-10-CM

## 2015-02-12 DIAGNOSIS — Z90710 Acquired absence of both cervix and uterus: Secondary | ICD-10-CM | POA: Diagnosis not present

## 2015-02-12 NOTE — Progress Notes (Signed)
Subjective:    Hannah Rocha is a 49 y.o. MW P0 female who presents for an annual exam. The patient has no complaints today. The patient is sexually active. Sex is much better since her hysterectomy. Libido is up. Says not as dry. GYN screening history: last pap: was normal. The patient wears seatbelts: yes. The patient participates in regular exercise: yes. Has the patient ever been transfused or tattooed?: no. The patient reports that there is not domestic violence in her life.   Menstrual History: OB History    Gravida Para Term Preterm AB TAB SAB Ectopic Multiple Living   1 0 0 0 1 1 0 0 0 0       Menarche age: 68  Patient's last menstrual period was 03/24/2013.    The following portions of the patient's history were reviewed and updated as appropriate: allergies, current medications, past family history, past medical history, past social history, past surgical history and problem list.  Review of Systems Pertinent items noted in HPI and remainder of comprehensive ROS otherwise negative. Needs mammo. Has had flu vaccine. Married for 16 years, no dyspareunia. Had colonoscopy with Dr. Sharlett Iles about 2-3 years ago. It will be due about 7 years from now.   Objective:    BP 127/83 mmHg  Pulse 85  Resp 18  Ht 5\' 4"  (1.626 m)  Wt 178 lb (80.74 kg)  BMI 30.54 kg/m2  LMP 03/24/2013  General Appearance:    Alert, cooperative, no distress, appears stated age  Head:    Normocephalic, without obvious abnormality, atraumatic  Eyes:    PERRL, conjunctiva/corneas clear, EOM's intact, fundi    benign, both eyes  Ears:    Normal TM's and external ear canals, both ears  Nose:   Nares normal, septum midline, mucosa normal, no drainage    or sinus tenderness  Throat:   Lips, mucosa, and tongue normal; teeth and gums normal  Neck:   Supple, symmetrical, trachea midline, no adenopathy;    thyroid:  no enlargement/tenderness/nodules; no carotid   bruit or JVD  Back:     Symmetric, no  curvature, ROM normal, no CVA tenderness  Lungs:     Clear to auscultation bilaterally, respirations unlabored  Chest Wall:    No tenderness or deformity   Heart:    Regular rate and rhythm, S1 and S2 normal, no murmur, rub   or gallop  Breast Exam:    No tenderness, masses, or nipple abnormality  Abdomen:     Soft, non-tender, bowel sounds active all four quadrants,    no masses, no organomegaly  Genitalia:    Normal female without lesion, discharge or tenderness, no pelvic masses or tenderness     Extremities:   Extremities normal, atraumatic, no cyanosis or edema  Pulses:   2+ and symmetric all extremities  Skin:   Skin color, texture, turgor normal, no rashes or lesions  Lymph nodes:   Cervical, supraclavicular, and axillary nodes normal  Neurologic:   CNII-XII intact, normal strength, sensation and reflexes    throughout  .    Assessment:    Healthy female exam.    Plan:     Breast self exam technique reviewed and patient encouraged to perform self-exam monthly. Mammogram.

## 2015-02-18 ENCOUNTER — Other Ambulatory Visit: Payer: BLUE CROSS/BLUE SHIELD

## 2015-02-21 ENCOUNTER — Telehealth: Payer: Self-pay | Admitting: *Deleted

## 2015-02-21 ENCOUNTER — Ambulatory Visit: Payer: BLUE CROSS/BLUE SHIELD

## 2015-02-21 NOTE — Telephone Encounter (Addendum)
Pt states she is asking for her opinion if she should postpone her surgery, she is having a skin biopsy of her back 03/04/2015, and is scheduled for surgery with Dr. Paulla Dolly 03/05/2015.  She states her other doctor said they would be fine with whatever Dr. Paulla Dolly said.  I told pt I would ask Dr. Paulla Dolly and call again.  Dr. Paulla Dolly states should not be a problem, I left message with statement.

## 2015-02-22 NOTE — Telephone Encounter (Signed)
Should not be a problem

## 2015-02-28 ENCOUNTER — Ambulatory Visit (INDEPENDENT_AMBULATORY_CARE_PROVIDER_SITE_OTHER): Payer: BLUE CROSS/BLUE SHIELD | Admitting: Podiatry

## 2015-02-28 DIAGNOSIS — M722 Plantar fascial fibromatosis: Secondary | ICD-10-CM

## 2015-02-28 DIAGNOSIS — Z9889 Other specified postprocedural states: Secondary | ICD-10-CM

## 2015-02-28 NOTE — Patient Instructions (Signed)
Pre-Operative Instructions  Congratulations, you have decided to take an important step to improving your quality of life.  You can be assured that the doctors of [facility] will be with you every step of the way.  1. Plan to be at the surgery center/hospital at least 1 (one) hour prior to your scheduled time unless otherwise directed by the surgical center/hospital staff.  You must have a responsible adult accompany you, remain during the surgery and drive you home.  Make sure you have directions to the surgical center/hospital and know how to get there on time. 2. For hospital based surgery you will need to obtain a history and physical form from your family physician within 1 month prior to the date of surgery- we will give you a form for you primary physician.  3. We make every effort to accommodate the date you request for surgery.  There are however, times where surgery dates or times have to be moved.  We will contact you as soon as possible if a change in schedule is required.   4. No Aspirin/Ibuprofen for one week before surgery.  If you are on aspirin, any non-steroidal anti-inflammatory medications (Mobic, Aleve, Ibuprofen) you should stop taking it 7 days prior to your surgery.  You make take Tylenol  For pain prior to surgery.  5. Medications- If you are taking daily heart and blood pressure medications, seizure, reflux, allergy, asthma, anxiety, pain or diabetes medications, make sure the surgery center/hospital is aware before the day of surgery so they may notify you which medications to take or avoid the day of surgery. 6. No food or drink after midnight the night before surgery unless directed otherwise by surgical center/hospital staff. 7. No alcoholic beverages 24 hours prior to surgery.  No smoking 24 hours prior to or 24 hours after surgery. 8. Wear loose pants or shorts- loose enough to fit over bandages, boots, and casts. 9. No slip on shoes, sneakers are best. 10. Bring  your boot with you to the surgery center/hospital.  Also bring crutches or a Mcmannis if your physician has prescribed it for you.  If you do not have this equipment, it will be provided for you after surgery. 11. If you have not been contracted by the surgery center/hospital by the day before your surgery, call to confirm the date and time of your surgery. 12. Leave-time from work may vary depending on the type of surgery you have.  Appropriate arrangements should be made prior to surgery with your employer. 13. Prescriptions will be provided immediately following surgery by your doctor.  Have these filled as soon as possible after surgery and take the medication as directed. 14. Remove nail polish on the operative foot. 15. Wash the night before surgery.  The night before surgery wash the foot and leg well with the antibacterial soap provided and water paying special attention to beneath the toenails and in between the toes.  Rinse thoroughly with water and dry well with a towel.  Perform this wash unless told not to do so by your physician.  Enclosed: 1 Ice pack (please put in freezer the night before surgery)   1 Hibiclens skin cleaner   Pre-op Instructions  If you have any questions regarding the instructions, do not hesitate to call our office.  [place]: [address] [place], [place] [address] [phone]  [place]: [address] [place], [place] [address] [phone]  [place]: [address]  [place], [place] [address] [phone]  Dr. Richard   Tuchman DPM, Dr. Jeffifer Rabold DPM Dr. Richard Sikora DPM, Dr. M. Todd Hyatt DPM, Dr. Kathryn Egerton DPM 

## 2015-03-01 NOTE — Progress Notes (Signed)
Subjective:     Patient ID: Hannah Rocha, female   DOB: 09-17-1965, 49 y.o.   MRN: [id number]  HPI patient states that her left heel is doing very well and she's ready to get her right heel fixed. States that she is walking with a good heel toe gait with minimal edema in the left foot and is satisfied with the results of the right heel has been very tender   Review of Systems     Objective:   Physical Exam Neurovascular status intact negative Homans sign noted wound edges well coapted medial and lateral side left heel with minimal plantar pain or swelling    Assessment:     Doing well post surgery left foot with exquisite discomfort in the right plantar heel at the insertion tendon calcaneus    Plan:     Reviewed both and recommended correction of the right and allow patient to read consent form reviewing alternative treatments and complications. I then for the left took stitches out applied sterile dressings and instructed on continued compression immobilization and reduced activity. Patient wants surgery and the right understanding is no guarantee as far as long-term success and signs consent form after extensive review

## 2015-03-05 DIAGNOSIS — M722 Plantar fascial fibromatosis: Secondary | ICD-10-CM | POA: Diagnosis not present

## 2015-03-11 ENCOUNTER — Ambulatory Visit
Admission: RE | Admit: 2015-03-11 | Discharge: 2015-03-11 | Disposition: A | Discharge status: Home / Self Care | Payer: BLUE CROSS/BLUE SHIELD | Source: Ambulatory Visit | Attending: Obstetrics & Gynecology | Admitting: Obstetrics & Gynecology

## 2015-03-11 DIAGNOSIS — Z1231 Encounter for screening mammogram for malignant neoplasm of breast: Secondary | ICD-10-CM | POA: Diagnosis present

## 2015-03-11 DIAGNOSIS — Z0000 Encounter for general adult medical examination without abnormal findings: Secondary | ICD-10-CM

## 2015-03-14 ENCOUNTER — Ambulatory Visit (INDEPENDENT_AMBULATORY_CARE_PROVIDER_SITE_OTHER): Payer: BLUE CROSS/BLUE SHIELD | Admitting: Podiatry

## 2015-03-14 VITALS — Temp 98.0°F

## 2015-03-14 DIAGNOSIS — M722 Plantar fascial fibromatosis: Secondary | ICD-10-CM

## 2015-03-14 DIAGNOSIS — Z9889 Other specified postprocedural states: Secondary | ICD-10-CM

## 2015-03-18 NOTE — Progress Notes (Signed)
She presents today status post EPF right foot date of surgery 03/05/2015. She states that she still has some soreness.  Objective: Vital signs are stable alert and oriented 3. Pulses are palpable. Dry sterile dressing removed demonstrates no erythema cellulitis drainage or odor. Sutures are intact and margins are well coapted.  Assessment: Well-healing surgical foot.  Plan: She will start soaking the foot after each shower Epsom salts and warm water. She will continue to utilize the cam Bosch around the clock until told to discontinue by primary surgeon. We will follow-up with her in 1 week for suture removal.

## 2015-03-21 ENCOUNTER — Encounter: Payer: Self-pay | Admitting: Podiatry

## 2015-03-21 NOTE — Progress Notes (Signed)
DOS 03-05-15  EPF right   Rx'd Vicodin 10-325 #25

## 2015-03-29 ENCOUNTER — Ambulatory Visit (INDEPENDENT_AMBULATORY_CARE_PROVIDER_SITE_OTHER): Payer: BLUE CROSS/BLUE SHIELD | Admitting: Podiatry

## 2015-03-29 DIAGNOSIS — M722 Plantar fascial fibromatosis: Secondary | ICD-10-CM | POA: Diagnosis not present

## 2015-03-29 DIAGNOSIS — Z9889 Other specified postprocedural states: Secondary | ICD-10-CM | POA: Diagnosis not present

## 2015-03-29 NOTE — Progress Notes (Signed)
Subjective:     Patient ID: Hannah Rocha, female   DOB: 1965/10/25, 50 y.o.   MRN: [id number]  HPI this patient returns to the office following foot surgery for the correction of plantar fasciitis, right heel. She says her foot and heel is still sore and stiff, but it is getting better daily. She presents the office wearing a Cam Dioguardi. She is scheduled for suture removal at this visit   Review of Systems     Objective:   Physical Examneurovascular status intact.  Healing noted a two incisions used for EPF surgery right foot. Sutures intact with good wound coaption.  No infection or redness at surgical site.     Assessment:     S/p foot surgery.     Plan:     ROV  Sutures removed.  Told her she may walk in surgical shoe.  If pain persists, she should use cam Charrette.  She already has surgical shoe at home. Told her to sleep with cam Vasek.  RTC 4  weeks for appointment with Dr. Paulla Dolly.   Gardiner Barefoot DPM

## 2015-04-15 ENCOUNTER — Ambulatory Visit (INDEPENDENT_AMBULATORY_CARE_PROVIDER_SITE_OTHER): Payer: BLUE CROSS/BLUE SHIELD | Admitting: Podiatry

## 2015-04-15 DIAGNOSIS — M722 Plantar fascial fibromatosis: Secondary | ICD-10-CM

## 2015-04-15 DIAGNOSIS — Z9889 Other specified postprocedural states: Secondary | ICD-10-CM

## 2015-04-16 NOTE — Progress Notes (Signed)
Subjective:     Patient ID: Hannah Rocha, female   DOB: 1965-12-10, 50 y.o.   MRN: [id number]  HPI patient states I'm doing really well with my heel   Review of Systems     Objective:   Physical Exam Neurovascular status intact with well coapted sites right and left heel with minimal discomfort noted    Assessment:     Doing well post endoscopic surgery    Plan:     Advised on continued compression continued immobilization as needed and reduced activity and this should gradually get better and reappoint as needed

## 2015-05-15 ENCOUNTER — Encounter: Payer: Self-pay | Admitting: Family Medicine

## 2015-05-15 ENCOUNTER — Ambulatory Visit (INDEPENDENT_AMBULATORY_CARE_PROVIDER_SITE_OTHER): Payer: BLUE CROSS/BLUE SHIELD | Admitting: Family Medicine

## 2015-05-15 VITALS — BP 122/82 | HR 84 | Temp 98.5°F | Wt 180.5 lb

## 2015-05-15 DIAGNOSIS — H6983 Other specified disorders of Eustachian tube, bilateral: Secondary | ICD-10-CM

## 2015-05-15 DIAGNOSIS — H6980 Other specified disorders of Eustachian tube, unspecified ear: Secondary | ICD-10-CM | POA: Insufficient documentation

## 2015-05-15 DIAGNOSIS — H6990 Unspecified Eustachian tube disorder, unspecified ear: Secondary | ICD-10-CM | POA: Insufficient documentation

## 2015-05-15 NOTE — Patient Instructions (Signed)
Use flonase 2 sprays in each nostril once a day and gently try to pop your ears.  When better, cut back to 1 spray of flonase in each nostril.  You can change to otc clotrimazole if needed, if not continuing to get better in another few days.  I wouldn't do anything else now.  Take care. Glad to see you.

## 2015-05-15 NOTE — Progress Notes (Signed)
Pre visit review using our clinic review tool, if applicable. No additional management support is needed unless otherwise documented below in the visit note.  With a cough for the last 2 weeks, improved recently. Now with B ears feeling "stopped up."  No FCNAVD.  She is better except for her ear sx.  Some ear discomfort.  She can still pop her ears, with some relief.  Still on flonase, 1 spray per nostril per day.   She has been using otc terbinafine on two spots, 1 on each anterior thigh.  Wanted eval.  She thought they were ringworms.    Meds, vitals, and allergies reviewed.   ROS: See HPI.  Otherwise, noncontributory.  GEN: nad, alert and oriented HEENT: mucous membranes moist, tm w/o erythema but sluggish TM movement on valsalva,  nasal exam w/o erythema, clear discharge noted,  OP with some mild cobblestoning NECK: supple w/o LA SKIN: chaperoned exam.  2 small flat pinkish macules that blanch on the B thighs, both ~2cm across.

## 2015-05-15 NOTE — Assessment & Plan Note (Signed)
Normal weber and rinne testing, d/w pt about anatomy and tx with flonase, valsava.  F/u prn.   The skin lesions are incidental and likely ringworms, continue topical tx as she has partial response so far.  Neither lesion looks ominous.

## 2015-07-16 DIAGNOSIS — Z1283 Encounter for screening for malignant neoplasm of skin: Secondary | ICD-10-CM | POA: Diagnosis not present

## 2015-07-16 DIAGNOSIS — L219 Seborrheic dermatitis, unspecified: Secondary | ICD-10-CM | POA: Diagnosis not present

## 2015-07-16 DIAGNOSIS — D485 Neoplasm of uncertain behavior of skin: Secondary | ICD-10-CM | POA: Diagnosis not present

## 2015-07-16 DIAGNOSIS — L739 Follicular disorder, unspecified: Secondary | ICD-10-CM | POA: Diagnosis not present

## 2015-07-29 ENCOUNTER — Ambulatory Visit (INDEPENDENT_AMBULATORY_CARE_PROVIDER_SITE_OTHER): Payer: BLUE CROSS/BLUE SHIELD | Admitting: Family Medicine

## 2015-07-29 ENCOUNTER — Encounter: Payer: Self-pay | Admitting: Family Medicine

## 2015-07-29 DIAGNOSIS — S161XXA Strain of muscle, fascia and tendon at neck level, initial encounter: Secondary | ICD-10-CM

## 2015-07-29 NOTE — Progress Notes (Signed)
Pre visit review using our clinic review tool, if applicable. No additional management support is needed unless otherwise documented below in the visit note. 
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
## 2015-07-29 NOTE — Patient Instructions (Signed)
Good to see you. Use ice/heat, tylenol and ibuprofen as needed.  Keep me updated.

## 2015-08-24 ENCOUNTER — Other Ambulatory Visit: Payer: Self-pay | Admitting: Family Medicine

## 2015-10-23 ENCOUNTER — Telehealth: Payer: Self-pay | Admitting: Family Medicine
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
## 2015-10-23 NOTE — Telephone Encounter (Signed)
Pt has appt to see Dr Deborra Medina on 10/24/15 at 10:45.

## 2015-10-24 ENCOUNTER — Encounter: Payer: Self-pay | Admitting: Family Medicine

## 2015-10-24 ENCOUNTER — Ambulatory Visit (INDEPENDENT_AMBULATORY_CARE_PROVIDER_SITE_OTHER): Payer: BLUE CROSS/BLUE SHIELD | Admitting: Family Medicine

## 2015-10-24 ENCOUNTER — Ambulatory Visit (INDEPENDENT_AMBULATORY_CARE_PROVIDER_SITE_OTHER)
Admission: RE | Admit: 2015-10-24 | Discharge: 2015-10-24 | Disposition: A | Discharge status: Home / Self Care | Payer: BLUE CROSS/BLUE SHIELD | Source: Ambulatory Visit | Attending: Family Medicine | Admitting: Family Medicine

## 2015-10-24 VITALS — BP 144/80 | HR 78 | Temp 98.7°F | Wt 163.5 lb

## 2015-10-24 DIAGNOSIS — M5441 Lumbago with sciatica, right side: Secondary | ICD-10-CM

## 2015-10-24 DIAGNOSIS — M545 Low back pain: Secondary | ICD-10-CM | POA: Diagnosis not present
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
## 2015-10-24 NOTE — Progress Notes (Signed)
Pre visit review using our clinic review tool, if applicable. No additional management support is needed unless otherwise documented below in the visit note. 

## 2015-10-24 NOTE — Assessment & Plan Note (Signed)
Deteriorated. Lumbar xray today. If xray unremarkable, consider PT. The patient indicates understanding of these issues and agrees with the plan.

## 2015-10-30 ENCOUNTER — Encounter: Payer: Self-pay | Admitting: Family Medicine

## 2015-11-11 ENCOUNTER — Encounter: Payer: Self-pay | Admitting: Family Medicine

## 2015-11-11 ENCOUNTER — Ambulatory Visit (INDEPENDENT_AMBULATORY_CARE_PROVIDER_SITE_OTHER): Payer: BLUE CROSS/BLUE SHIELD | Admitting: Family Medicine

## 2015-11-11 VITALS — BP 132/78 | HR 65 | Temp 98.1°F | Wt 163.8 lb

## 2015-11-11 DIAGNOSIS — M5441 Lumbago with sciatica, right side: Secondary | ICD-10-CM
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
## 2015-11-11 NOTE — Assessment & Plan Note (Signed)
Persistent, intermittent. Refer for PT. The patient indicates understanding of these issues and agrees with the plan.  Orders Placed This Encounter  Procedures  . Ambulatory referral to Physical Therapy

## 2015-11-11 NOTE — Progress Notes (Signed)
Pre visit review using our clinic review tool, if applicable. No additional management support is needed unless otherwise documented below in the visit note. 

## 2015-11-11 NOTE — Patient Instructions (Signed)
Good to see you. Please stop by to see [name] on your way out. 

## 2015-11-18 DIAGNOSIS — M545 Low back pain: Secondary | ICD-10-CM | POA: Diagnosis not present

## 2015-11-18 DIAGNOSIS — M5441 Lumbago with sciatica, right side: Secondary | ICD-10-CM | POA: Diagnosis not present

## 2015-11-20 DIAGNOSIS — M5441 Lumbago with sciatica, right side: Secondary | ICD-10-CM | POA: Diagnosis not present

## 2015-11-20 DIAGNOSIS — M545 Low back pain: Secondary | ICD-10-CM | POA: Diagnosis not present

## 2015-11-27 DIAGNOSIS — M5441 Lumbago with sciatica, right side: Secondary | ICD-10-CM | POA: Diagnosis not present

## 2015-11-27 DIAGNOSIS — M545 Low back pain: Secondary | ICD-10-CM | POA: Diagnosis not present

## 2015-11-28 DIAGNOSIS — M5441 Lumbago with sciatica, right side: Secondary | ICD-10-CM | POA: Diagnosis not present

## 2015-11-28 DIAGNOSIS — M545 Low back pain: Secondary | ICD-10-CM | POA: Diagnosis not present

## 2015-11-29 NOTE — Progress Notes (Signed)
DOS 11.15.2016 Endoscopic release med band left heel

## 2015-12-03 DIAGNOSIS — M545 Low back pain: Secondary | ICD-10-CM | POA: Diagnosis not present

## 2015-12-03 DIAGNOSIS — M5441 Lumbago with sciatica, right side: Secondary | ICD-10-CM | POA: Diagnosis not present

## 2015-12-05 DIAGNOSIS — M5441 Lumbago with sciatica, right side: Secondary | ICD-10-CM | POA: Diagnosis not present

## 2015-12-05 DIAGNOSIS — M545 Low back pain: Secondary | ICD-10-CM | POA: Diagnosis not present

## 2015-12-11 DIAGNOSIS — M545 Low back pain: Secondary | ICD-10-CM | POA: Diagnosis not present

## 2015-12-11 DIAGNOSIS — M5441 Lumbago with sciatica, right side: Secondary | ICD-10-CM | POA: Diagnosis not present

## 2015-12-17 DIAGNOSIS — M5441 Lumbago with sciatica, right side: Secondary | ICD-10-CM | POA: Diagnosis not present

## 2015-12-17 DIAGNOSIS — M545 Low back pain: Secondary | ICD-10-CM | POA: Diagnosis not present

## 2015-12-19 DIAGNOSIS — M5441 Lumbago with sciatica, right side: Secondary | ICD-10-CM | POA: Diagnosis not present

## 2015-12-19 DIAGNOSIS — M545 Low back pain: Secondary | ICD-10-CM | POA: Diagnosis not present

## 2015-12-24 DIAGNOSIS — M5441 Lumbago with sciatica, right side: Secondary | ICD-10-CM | POA: Diagnosis not present

## 2015-12-24 DIAGNOSIS — M545 Low back pain: Secondary | ICD-10-CM | POA: Diagnosis not present

## 2015-12-25 ENCOUNTER — Other Ambulatory Visit: Payer: Self-pay | Admitting: Family Medicine

## 2015-12-26 DIAGNOSIS — M5441 Lumbago with sciatica, right side: Secondary | ICD-10-CM | POA: Diagnosis not present

## 2015-12-26 DIAGNOSIS — M545 Low back pain: Secondary | ICD-10-CM | POA: Diagnosis not present

## 2015-12-31 DIAGNOSIS — M545 Low back pain: Secondary | ICD-10-CM | POA: Diagnosis not present

## 2015-12-31 DIAGNOSIS — M5441 Lumbago with sciatica, right side: Secondary | ICD-10-CM | POA: Diagnosis not present

## 2016-01-02 DIAGNOSIS — M5441 Lumbago with sciatica, right side: Secondary | ICD-10-CM | POA: Diagnosis not present

## 2016-01-02 DIAGNOSIS — M545 Low back pain: Secondary | ICD-10-CM | POA: Diagnosis not present

## 2016-01-07 DIAGNOSIS — M545 Low back pain: Secondary | ICD-10-CM | POA: Diagnosis not present

## 2016-01-07 DIAGNOSIS — M5441 Lumbago with sciatica, right side: Secondary | ICD-10-CM | POA: Diagnosis not present

## 2016-01-09 DIAGNOSIS — M5441 Lumbago with sciatica, right side: Secondary | ICD-10-CM | POA: Diagnosis not present

## 2016-01-09 DIAGNOSIS — M545 Low back pain: Secondary | ICD-10-CM | POA: Diagnosis not present

## 2016-01-25 ENCOUNTER — Other Ambulatory Visit: Payer: Self-pay | Admitting: Family Medicine

## 2016-01-27 NOTE — Telephone Encounter (Signed)
Pt has not had an anxiety f/u since 12/2013

## 2016-01-28 DIAGNOSIS — D485 Neoplasm of uncertain behavior of skin: Secondary | ICD-10-CM | POA: Diagnosis not present

## 2016-01-28 DIAGNOSIS — D229 Melanocytic nevi, unspecified: Secondary | ICD-10-CM | POA: Diagnosis not present

## 2016-01-28 DIAGNOSIS — Z1283 Encounter for screening for malignant neoplasm of skin: Secondary | ICD-10-CM | POA: Diagnosis not present

## 2016-01-28 DIAGNOSIS — I781 Nevus, non-neoplastic: Secondary | ICD-10-CM | POA: Diagnosis not present

## 2016-02-21 ENCOUNTER — Ambulatory Visit (INDEPENDENT_AMBULATORY_CARE_PROVIDER_SITE_OTHER): Payer: BLUE CROSS/BLUE SHIELD | Admitting: Obstetrics & Gynecology

## 2016-02-21 ENCOUNTER — Encounter: Payer: Self-pay | Admitting: Obstetrics & Gynecology

## 2016-02-21 VITALS — BP 146/88 | HR 76 | Resp 18 | Ht 64.0 in | Wt 156.0 lb

## 2016-02-21 DIAGNOSIS — N760 Acute vaginitis: Secondary | ICD-10-CM

## 2016-02-21 DIAGNOSIS — B9689 Other specified bacterial agents as the cause of diseases classified elsewhere: Secondary | ICD-10-CM

## 2016-02-21 DIAGNOSIS — Z01419 Encounter for gynecological examination (general) (routine) without abnormal findings: Secondary | ICD-10-CM

## 2016-02-21 LAB — COMPREHENSIVE METABOLIC PANEL
ALBUMIN: 3.9 g/dL (ref 3.6–5.1)
ALT: 20 U/L (ref 6–29)
AST: 14 U/L (ref 10–35)
Alkaline Phosphatase: 78 U/L (ref 33–130)
BILIRUBIN TOTAL: 0.5 mg/dL (ref 0.2–1.2)
BUN: 10 mg/dL (ref 7–25)
CALCIUM: 8.8 mg/dL (ref 8.6–10.4)
CHLORIDE: 99 mmol/L (ref 98–110)
CO2: 26 mmol/L (ref 20–31)
Creat: 0.69 mg/dL (ref 0.50–1.05)
Glucose, Bld: 351 mg/dL — ABNORMAL HIGH (ref 65–99)
POTASSIUM: 3.8 mmol/L (ref 3.5–5.3)
Sodium: 135 mmol/L (ref 135–146)
Total Protein: 6.5 g/dL (ref 6.1–8.1)

## 2016-02-21 LAB — CBC
HEMATOCRIT: 41.7 % (ref 35.0–45.0)
HEMOGLOBIN: 13.8 g/dL (ref 11.7–15.5)
MCH: 31.1 pg (ref 27.0–33.0)
MCHC: 33.1 g/dL (ref 32.0–36.0)
MCV: 93.9 fL (ref 80.0–100.0)
MPV: 11 fL (ref 7.5–12.5)
Platelets: 166 10*3/uL (ref 140–400)
RBC: 4.44 MIL/uL (ref 3.80–5.10)
RDW: 12.9 % (ref 11.0–15.0)
WBC: 4.7 10*3/uL (ref 3.8–10.8)

## 2016-02-21 LAB — LIPID PANEL
CHOL/HDL RATIO: 7.2 ratio — AB (ref <5.0)
CHOLESTEROL: 222 mg/dL — AB (ref <200)
HDL: 31 mg/dL — AB (ref >50)
LDL Cholesterol: 122 mg/dL — ABNORMAL HIGH (ref <100)
TRIGLYCERIDES: 346 mg/dL — AB (ref <150)
VLDL: 69 mg/dL — AB (ref <30)

## 2016-02-21 LAB — TSH: TSH: 3.47 mIU/L

## 2016-02-21 MED ORDER — METRONIDAZOLE 500 MG PO TABS: 500.0000 mg | ORAL_TABLET | Freq: Two times a day (BID) | ORAL | 0 refills | Status: DC

## 2016-02-21 MED ORDER — IBUPROFEN 800 MG PO TABS: 800.0000 mg | ORAL_TABLET | Freq: Three times a day (TID) | ORAL | 1 refills | Status: DC | PRN

## 2016-02-21 NOTE — Progress Notes (Signed)
Subjective:    Hannah Rocha is a 50 y.o. MW P0  female who presents for an annual exam. The patient has no complaints today except some vaginal discharge and itching. The patient is sexually active. GYN screening history: last pap: was normal. The patient wears seatbelts: yes. The patient participates in regular exercise: yes. Has the patient ever been transfused or tattooed?: no. The patient reports that there is not domestic violence in her life.   Menstrual History: OB History    Gravida Para Term Preterm AB Living   1 0 0 0 1 0   SAB TAB Ectopic Multiple Live Births   0 1 0 0        Menarche age: 52 Patient's last menstrual period was 03/24/2013.    The following portions of the patient's history were reviewed and updated as appropriate: allergies, current medications, past family history, past medical history, past social history, past surgical history and problem list.  Review of Systems Pertinent items are noted in HPI.   Married for 17 years, denies dyspareunia Colonoscopy UTD Mammogram due next month Already had flu vaccine   Objective:    BP (!) 146/88 (BP Location: Left Arm, Patient Position: Sitting, Cuff Size: Normal)   Pulse 76   Resp 18   Ht 5\' 4"  (1.626 m)   Wt 156 lb (70.8 kg)   LMP 03/24/2013   BMI 26.78 kg/m   General Appearance:    Alert, cooperative, no distress, appears stated age  Head:    Normocephalic, without obvious abnormality, atraumatic  Eyes:    PERRL, conjunctiva/corneas clear, EOM's intact, fundi    benign, both eyes  Ears:    Normal TM's and external ear canals, both ears  Nose:   Nares normal, septum midline, mucosa normal, no drainage    or sinus tenderness  Throat:   Lips, mucosa, and tongue normal; teeth and gums normal  Neck:   Supple, symmetrical, trachea midline, no adenopathy;    thyroid:  no enlargement/tenderness/nodules; no carotid   bruit or JVD  Back:     Symmetric, no curvature, ROM normal, no CVA tenderness   Lungs:     Clear to auscultation bilaterally, respirations unlabored  Chest Wall:    No tenderness or deformity   Heart:    Regular rate and rhythm, S1 and S2 normal, no murmur, rub   or gallop  Breast Exam:    No tenderness, masses, or nipple abnormality  Abdomen:     Soft, non-tender, bowel sounds active all four quadrants,    no masses, no organomegaly  Genitalia:    Normal female without lesion, discharge or tenderness, no vault prolapse, vaginal discharge c/w BV, no masses with bimanual exam     Extremities:   Extremities normal, atraumatic, no cyanosis or edema  Pulses:   2+ and symmetric all extremities  Skin:   Skin color, texture, turgor normal, no rashes or lesions  Lymph nodes:   Cervical, supraclavicular, and axillary nodes normal  Neurologic:   CNII-XII intact, normal strength, sensation and reflexes    throughout   .    Assessment:    Healthy female exam.   BV   Plan:     Mammogram. fasting labs today   Flagyl

## 2016-02-22 ENCOUNTER — Other Ambulatory Visit: Payer: Self-pay | Admitting: Family Medicine

## 2016-02-22 ENCOUNTER — Other Ambulatory Visit: Payer: Self-pay | Admitting: Gastroenterology

## 2016-02-22 LAB — VITAMIN D 25 HYDROXY (VIT D DEFICIENCY, FRACTURES): Vit D, 25-Hydroxy: 25 ng/mL — ABNORMAL LOW (ref 30–100)

## 2016-02-24 ENCOUNTER — Encounter: Payer: Self-pay | Admitting: *Deleted

## 2016-02-24 NOTE — Telephone Encounter (Signed)
Pt has not had f/u since 12/2013

## 2016-02-24 NOTE — Telephone Encounter (Signed)
Spoke to pt and advised. CPE scheduled

## 2016-02-24 NOTE — Telephone Encounter (Signed)
Rx refilled- please schedule 30 min office visit for follow up.

## 2016-02-25 ENCOUNTER — Telehealth: Payer: Self-pay | Admitting: *Deleted

## 2016-02-25 DIAGNOSIS — R7989 Other specified abnormal findings of blood chemistry: Secondary | ICD-10-CM

## 2016-02-25 MED ORDER — VITAMIN D (ERGOCALCIFEROL) 1.25 MG (50000 UNIT) PO CAPS: 50000.0000 [IU] | ORAL_CAPSULE | ORAL | 0 refills | Status: DC

## 2016-02-25 NOTE — Telephone Encounter (Signed)
-----   Message from Emily Filbert, MD sent at 02/24/2016  3:58 PM EST ----- She will need to see her FP about her lipids. Thanks  She will need 8 weeks of weekly Vitamin D 50,000 units and then a recheck of her level. Thanks

## 2016-02-25 NOTE — Telephone Encounter (Signed)
Informed pt of lab results and the importance to follow-up with Dr Deborra Medina (PCP) for further evaluation of elevated cholesterol and glucose. Instructed pt on medication use for Vitamin D and to come back to office for repeat Vitamin D level when she completes the 8 weeks of use. Pt acknowledged instructions.

## 2016-02-29 ENCOUNTER — Encounter: Payer: Self-pay | Admitting: Family Medicine

## 2016-03-04 ENCOUNTER — Encounter: Payer: Self-pay | Admitting: Family Medicine

## 2016-03-09 ENCOUNTER — Encounter: Payer: Self-pay | Admitting: Family Medicine

## 2016-03-09 ENCOUNTER — Ambulatory Visit (INDEPENDENT_AMBULATORY_CARE_PROVIDER_SITE_OTHER): Payer: BLUE CROSS/BLUE SHIELD | Admitting: Family Medicine

## 2016-03-09 VITALS — BP 116/74 | HR 97 | Temp 98.3°F | Wt 154.5 lb

## 2016-03-09 DIAGNOSIS — Z01419 Encounter for gynecological examination (general) (routine) without abnormal findings: Secondary | ICD-10-CM | POA: Diagnosis not present

## 2016-03-09 DIAGNOSIS — F419 Anxiety disorder, unspecified: Secondary | ICD-10-CM | POA: Diagnosis not present

## 2016-03-09 DIAGNOSIS — E782 Mixed hyperlipidemia: Secondary | ICD-10-CM | POA: Diagnosis not present

## 2016-03-09 DIAGNOSIS — E1165 Type 2 diabetes mellitus with hyperglycemia: Secondary | ICD-10-CM | POA: Insufficient documentation

## 2016-03-09 DIAGNOSIS — R739 Hyperglycemia, unspecified: Secondary | ICD-10-CM | POA: Diagnosis not present

## 2016-03-09 DIAGNOSIS — E119 Type 2 diabetes mellitus without complications: Secondary | ICD-10-CM | POA: Insufficient documentation

## 2016-03-09 DIAGNOSIS — E785 Hyperlipidemia, unspecified: Secondary | ICD-10-CM | POA: Insufficient documentation

## 2016-03-09 DIAGNOSIS — E559 Vitamin D deficiency, unspecified: Secondary | ICD-10-CM | POA: Insufficient documentation

## 2016-03-09 MED ORDER — BUSPIRONE HCL 15 MG PO TABS: ORAL_TABLET | ORAL | 1 refills | Status: DC

## 2016-03-09 NOTE — Assessment & Plan Note (Signed)
New and advised likely new onset diabetes. She is very hesitant to start rx and I agree that we certainly should confirm with a1c today. She has changed her diet over the last couple of weeks- cut out carbs and sugars.

## 2016-03-09 NOTE — Assessment & Plan Note (Signed)
Recheck triglycerides today.

## 2016-03-09 NOTE — Patient Instructions (Addendum)
Great to see you.  I will call you with your lab results tomorrow.  Happy Holidays!

## 2016-03-09 NOTE — Assessment & Plan Note (Signed)
She is more anxious today because of her new probable diagnosis of diabetes, elevated blood sugar and triglycerides. Prior tot his, she feels buspar was controlling her symptoms. No changes made to anxiety rxs today.

## 2016-03-09 NOTE — Progress Notes (Signed)
Subjective:   Patient ID: Hannah Rocha, female    DOB: February 01, 1966, 50 y.o.   MRN: [id number]  Hannah Rocha is a pleasant 50 y.o. year old female who presents to clinic today with Follow-up and Diabetes (new onset)  on 03/09/2016  HPI:  Originally here for CPX but just had one with GYN and has several issues to follow up today.  Received the following message from pt on 02/29/16-  Hello Dr, Deborra Medina,  I recently had a check up with Dr. Hulan Fray on 120/01/17. She wanted to do blood work on my Vitamin D and it came back abnormal so I am taking a supplement which she prescribed to take for 8 weeks. My other blood work came back and was not good at all and her nurse suggested I talked with you on my next visit on 03/09/16. I will be coming into the office to renew my Buspar for the year and your nurse wanted to do an annual physical also. You can look at my reading and my Triglycerides are still up and my Blood Glucose is extremely high. I have been eating way to much sweets, breads, and not eating lean meats. If I cut out my sweet consumption will that help with the possible Diabetes as I really do not want to go on the medicine called Metformin I just heard a lot about kidney failure. I have not eaten any sweets since 12/ 4/17 when Dr. Alease Medina nurse told me of the Glucose reading which has really scared me. Just giving you heads up.  Thanks, see you soon  Angie Borelli  Blood work reviewed.  TG 346, glucose 351 Has changed her diet.  Lost weight.   Wt Readings from Last 3 Encounters:  03/09/16 154 lb 8 oz (70.1 kg)  02/21/16 156 lb (70.8 kg)  11/11/15 163 lb 12 oz (74.3 kg)   + family history of DM  Lab Results  Component Value Date   CHOL 222 (H) 02/21/2016   HDL 31 (L) 02/21/2016   LDLCALC 122 (H) 02/21/2016   LDLDIRECT 87.0 02/27/2014   TRIG 346 (H) 02/21/2016   CHOLHDL 7.2 (H) 02/21/2016   No results found for: HGBA1C  Liver function, CBC, TSH within normal limits  along with Creatinine and electrolytes.  Anxiety- symptoms have been controlled with buspra 15 mg three times daily.  Vit D deficiency- VIt D 25.  Placed on repletion by GYN.  Current Outpatient Prescriptions on File Prior to Visit  Medication Sig Dispense Refill  . acetaminophen (TYLENOL) 500 MG tablet Take 500 mg by mouth every 6 (six) hours as needed.    . B Complex Vitamins (VITAMIN B COMPLEX PO) Take 1 tablet by mouth daily.    . flintstones complete (FLINTSTONES) 60 MG chewable tablet Chew 1 tablet by mouth daily.    . fluticasone (FLONASE) 50 MCG/ACT nasal spray USE 2 SPRAYS IN EACH NOSTRIL ONCE A DAY 16 g 4  . ibuprofen (ADVIL,MOTRIN) 800 MG tablet Take 1 tablet (800 mg total) by mouth every 8 (eight) hours as needed for pain. 60 tablet 1  . pantoprazole (PROTONIX) 40 MG tablet TAKE 1 TABLET (40 MG TOTAL) BY MOUTH DAILY. 30 tablet 0  . Vitamin D, Ergocalciferol, (DRISDOL) 50000 units CAPS capsule Take 1 capsule (50,000 Units total) by mouth every 7 (seven) days. For 8 weeks 8 capsule 0   No current facility-administered medications on file prior to visit.     Allergies  Allergen Reactions  .  Sertraline Hcl     REACTION: vomiting, severe anxiety    Past Medical History:  Diagnosis Date  . Allergy   . Anemia    hx  . Anxiety   . GERD (gastroesophageal reflux disease)   . H/O hiatal hernia   . Obesity   . PONV (postoperative nausea and vomiting)     Past Surgical History:  Procedure Laterality Date  . ASD REPAIR  1999   [facility]  . BREAST CYST ASPIRATION Right 2013  . CLEFT LIP REPAIR    . LAPAROSCOPIC ASSISTED VAGINAL HYSTERECTOMY Bilateral 04/13/2013   Procedure: LAPAROSCOPIC ASSISTED VAGINAL HYSTERECTOMY;  Surgeon: Emily Filbert, MD;  Location: [facility];  Service: Gynecology;  Laterality: Bilateral;  . LAPAROSCOPIC TUBAL LIGATION  03/21/2012   Procedure: LAPAROSCOPIC TUBAL LIGATION;  Surgeon: Emily Filbert, MD;  Location: [facility];  Service: Gynecology;  Laterality:  Bilateral;  . MANDIBLE FRACTURE SURGERY    . Plantar Fascitis Repair  01/2015  . TUBAL LIGATION      Family History  Problem Relation Age of Onset  . Heart disease Mother   . Colon polyps Mother   . Prostate cancer Father   . Heart disease Father   . Cancer Father     prostate  . Colon cancer Neg Hx     Social History   Social History  . Marital status: Married    Spouse name: N/A  . Number of children: 0  . Years of education: N/A   Occupational History  . [employer]   Social History Main Topics  . Smoking status: Former Smoker    Packs/day: 0.00    Years: 0.00    Quit date: 02/21/1987  . Smokeless tobacco: Never Used     Comment: 22 years ago-light smoker  . Alcohol use 0.0 oz/week     Comment: occasional  . Drug use: No  . Sexual activity: Yes    Partners: Male    Birth control/ protection: Surgical     Comment: tubaligation/hysterctomy   Other Topics Concern  . Not on file   Social History Narrative   Daily caffeine use   The PMH, PSH, Social History, Family History, Medications, and allergies have been reviewed in [facility], and have been updated if relevant.   Review of Systems  Constitutional: Negative.   HENT: Negative.   Respiratory: Negative.   Cardiovascular: Negative.   Gastrointestinal: Negative.   Endocrine: Negative.   Genitourinary: Negative.   Musculoskeletal: Negative.   Hematological: Negative.   Psychiatric/Behavioral: Negative.   All other systems reviewed and are negative.      Objective:    BP 116/74   Pulse 97   Temp 98.3 F (36.8 C) (Oral)   Wt 154 lb 8 oz (70.1 kg)   LMP 03/24/2013   SpO2 97%   BMI 26.52 kg/m   Wt Readings from Last 3 Encounters:  03/09/16 154 lb 8 oz (70.1 kg)  02/21/16 156 lb (70.8 kg)  11/11/15 163 lb 12 oz (74.3 kg)    Physical Exam  Constitutional: She is oriented to person, place, and time. She appears well-developed and well-nourished. No distress.  HENT:    Head: Normocephalic and atraumatic.  Eyes: Conjunctivae are normal.  Cardiovascular: Normal rate and regular rhythm.   Pulmonary/Chest: Effort normal and breath sounds normal.  Musculoskeletal: Normal range of motion. She exhibits no edema.  Neurological: She is alert and oriented to person, place, and time. No cranial nerve deficit.  Skin: Skin is warm and dry. She is not diaphoretic.  Psychiatric: Her behavior is normal. Judgment normal. Her mood appears anxious. Her speech is rapid and/or pressured. Thought content is paranoid. Cognition and memory are normal.  Nursing note and vitals reviewed.         Assessment & Plan:   Anxiety  Well woman exam  Elevated cholesterol with high triglycerides - Plan: Lipid panel  Elevated blood sugar - Plan: Hemoglobin A1c  Vitamin D deficiency No Follow-up on file.

## 2016-03-10 LAB — LIPID PANEL
CHOL/HDL RATIO: 6
Cholesterol: 248 mg/dL — ABNORMAL HIGH (ref 0–200)
HDL: 38.5 mg/dL — ABNORMAL LOW (ref >39.00)
NONHDL: 209.92
TRIGLYCERIDES: 236 mg/dL — AB (ref 0.0–149.0)
VLDL: 47.2 mg/dL — AB (ref 0.0–40.0)

## 2016-03-10 LAB — LDL CHOLESTEROL, DIRECT: Direct LDL: 169 mg/dL

## 2016-03-10 LAB — HEMOGLOBIN A1C: Hgb A1c MFr Bld: 13.6 % — ABNORMAL HIGH (ref 4.6–6.5)

## 2016-03-11 ENCOUNTER — Other Ambulatory Visit: Payer: Self-pay | Admitting: Family Medicine

## 2016-03-11 MED ORDER — METFORMIN HCL 500 MG PO TABS: 500.0000 mg | ORAL_TABLET | Freq: Two times a day (BID) | ORAL | 2 refills | Status: DC

## 2016-03-17 ENCOUNTER — Ambulatory Visit (INDEPENDENT_AMBULATORY_CARE_PROVIDER_SITE_OTHER): Payer: BLUE CROSS/BLUE SHIELD | Admitting: Gastroenterology

## 2016-03-17 ENCOUNTER — Encounter: Payer: Self-pay | Admitting: Gastroenterology

## 2016-03-17 VITALS — BP 118/70 | HR 72 | Ht 64.0 in | Wt 153.2 lb

## 2016-03-17 DIAGNOSIS — K921 Melena: Secondary | ICD-10-CM

## 2016-03-17 DIAGNOSIS — K219 Gastro-esophageal reflux disease without esophagitis: Secondary | ICD-10-CM | POA: Diagnosis not present

## 2016-03-17 MED ORDER — PANTOPRAZOLE SODIUM 40 MG PO TBEC: 40.0000 mg | DELAYED_RELEASE_TABLET | Freq: Every day | ORAL | 11 refills | Status: DC

## 2016-03-17 MED ORDER — NA SULFATE-K SULFATE-MG SULF 17.5-3.13-1.6 GM/177ML PO SOLN: 1.0000 | ORAL | 0 refills | Status: AC

## 2016-03-17 NOTE — Progress Notes (Signed)
History of Present Illness: This is a 50 year old female complaining of hematochezia. She notes intermittent small bowel amounts of bright red blood on the tissue paper when wiping. She is noted blood on the commode on one occasion. She has occasional mild anal discomfort which has been helped with the use of Preparation H cream intermittently. Her reflux symptoms are well controlled on daily pantoprazole. Last colonoscopy was performed by Dr. Sharlett Iles in April 2011 was normal.  Allergies  Allergen Reactions  . Sertraline Hcl     REACTION: vomiting, severe anxiety   Outpatient Medications Prior to Visit  Medication Sig Dispense Refill  . acetaminophen (TYLENOL) 500 MG tablet Take 500 mg by mouth every 6 (six) hours as needed.    . B Complex Vitamins (VITAMIN B COMPLEX PO) Take 1 tablet by mouth daily.    . busPIRone (BUSPAR) 15 MG tablet TAKE 1 TABLET BY MOUTH 3 TIMES DAILY. 270 tablet 1  . flintstones complete (FLINTSTONES) 60 MG chewable tablet Chew 1 tablet by mouth daily.    . fluticasone (FLONASE) 50 MCG/ACT nasal spray USE 2 SPRAYS IN EACH NOSTRIL ONCE A DAY 16 g 4  . ibuprofen (ADVIL,MOTRIN) 800 MG tablet Take 1 tablet (800 mg total) by mouth every 8 (eight) hours as needed for pain. 60 tablet 1  . metFORMIN (GLUCOPHAGE) 500 MG tablet Take 1 tablet (500 mg total) by mouth 2 (two) times daily with a meal. 60 tablet 2  . pantoprazole (PROTONIX) 40 MG tablet TAKE 1 TABLET (40 MG TOTAL) BY MOUTH DAILY. 30 tablet 0  . Vitamin D, Ergocalciferol, (DRISDOL) 50000 units CAPS capsule Take 1 capsule (50,000 Units total) by mouth every 7 (seven) days. For 8 weeks 8 capsule 0   No facility-administered medications prior to visit.    Past Medical History:  Diagnosis Date  . Allergy   . Anemia    hx  . Anxiety   . GERD (gastroesophageal reflux disease)   . H/O hiatal hernia   . Obesity   . PONV (postoperative nausea and vomiting)    Past Surgical History:  Procedure Laterality Date    . ASD REPAIR  1999   [facility]  . BREAST CYST ASPIRATION Right 2013  . CLEFT LIP REPAIR    . LAPAROSCOPIC ASSISTED VAGINAL HYSTERECTOMY Bilateral 04/13/2013   Procedure: LAPAROSCOPIC ASSISTED VAGINAL HYSTERECTOMY;  Surgeon: Emily Filbert, MD;  Location: [facility];  Service: Gynecology;  Laterality: Bilateral;  . LAPAROSCOPIC TUBAL LIGATION  03/21/2012   Procedure: LAPAROSCOPIC TUBAL LIGATION;  Surgeon: Emily Filbert, MD;  Location: [facility];  Service: Gynecology;  Laterality: Bilateral;  . MANDIBLE FRACTURE SURGERY    . Plantar Fascitis Repair  01/2015  . TUBAL LIGATION     Social History   Social History  . Marital status: Married    Spouse name: N/A  . Number of children: 0  . Years of education: N/A   Occupational History  . [employer]   Social History Main Topics  . Smoking status: Former Smoker    Packs/day: 0.00    Years: 0.00    Quit date: 02/21/1987  . Smokeless tobacco: Never Used     Comment: 22 years ago-light smoker  . Alcohol use 0.0 oz/week     Comment: occasional  . Drug use: No  . Sexual activity: Yes    Partners: Male    Birth control/ protection: Surgical     Comment: tubaligation/hysterctomy   Other Topics Concern  .  None   Social History Narrative   Daily caffeine use   Family History  Problem Relation Age of Onset  . Heart disease Mother   . Colon polyps Mother   . Prostate cancer Father   . Heart disease Father   . Cancer Father     prostate  . Colon cancer Neg Hx        Physical Exam: General: Well developed, well nourished, no acute distress Head: Normocephalic and atraumatic Eyes:  sclerae anicteric, EOMI Ears: Normal auditory acuity Mouth: No deformity or lesions Lungs: Clear throughout to auscultation Heart: Regular rate and rhythm; no murmurs, rubs or bruits Abdomen: Soft, non tender and non distended. No masses, hepatosplenomegaly or hernias noted. Normal Bowel sounds Rectal: no lesions, mild anal canal  tenderness, trace heme + brown stool Musculoskeletal: Symmetrical with no gross deformities  Pulses:  Normal pulses noted Extremities: No clubbing, cyanosis, edema or deformities noted Neurological: Alert oriented x 4, grossly nonfocal Psychological:  Alert and cooperative. Normal mood and affect   Assessment and Recommendations:  1. GERD. Refill pantoprazole 40 mg daily. Follow antireflux measures.   2. Small volume hematochezia, trace heme + stool-suspected hemorrhoids. Mother with colon polyps. R/O colorectal neoplasms. Schedule colonoscopy. The risks, benefits, and alternatives to colonoscopy with possible biopsy, possible polypectomy were discussed with the patient and they consent to proceed.

## 2016-03-17 NOTE — Patient Instructions (Signed)
Please purchase the following medications over the counter and take as directed: Preparation H   We have sent the following medications to your pharmacy for you to pick up at your convenience: Pantoprazole   You have been scheduled for a colonoscopy. Please follow written instructions given to you at your visit today.  Please pick up your prep supplies at the pharmacy within the next 1-3 days. If you use inhalers (even only as needed), please bring them with you on the day of your procedure. Your physician has requested that you go to www.startemmi.com and enter the access code given to you at your visit today. This web site gives a general overview about your procedure. However, you should still follow specific instructions given to you by our office regarding your preparation for the procedure.

## 2016-03-25 ENCOUNTER — Other Ambulatory Visit: Payer: Self-pay | Admitting: Family Medicine

## 2016-03-31 ENCOUNTER — Ambulatory Visit
Admission: RE | Admit: 2016-03-31 | Discharge: 2016-03-31 | Disposition: A | Discharge status: Home / Self Care | Payer: BLUE CROSS/BLUE SHIELD | Source: Ambulatory Visit | Attending: Obstetrics & Gynecology | Admitting: Obstetrics & Gynecology

## 2016-03-31 DIAGNOSIS — Z1231 Encounter for screening mammogram for malignant neoplasm of breast: Secondary | ICD-10-CM | POA: Insufficient documentation

## 2016-03-31 DIAGNOSIS — Z01419 Encounter for gynecological examination (general) (routine) without abnormal findings: Secondary | ICD-10-CM

## 2016-04-01 ENCOUNTER — Encounter: Payer: Self-pay | Admitting: Family Medicine

## 2016-04-02 ENCOUNTER — Other Ambulatory Visit: Payer: Self-pay | Admitting: Family Medicine

## 2016-04-02 DIAGNOSIS — E119 Type 2 diabetes mellitus without complications: Secondary | ICD-10-CM

## 2016-04-10 ENCOUNTER — Encounter: Payer: BLUE CROSS/BLUE SHIELD | Attending: Family Medicine | Admitting: *Deleted

## 2016-04-10 ENCOUNTER — Encounter: Payer: Self-pay | Admitting: *Deleted

## 2016-04-10 VITALS — BP 140/82 | Ht 64.0 in | Wt 152.5 lb

## 2016-04-10 DIAGNOSIS — E119 Type 2 diabetes mellitus without complications: Secondary | ICD-10-CM | POA: Insufficient documentation

## 2016-04-10 DIAGNOSIS — Z713 Dietary counseling and surveillance: Secondary | ICD-10-CM | POA: Diagnosis not present

## 2016-04-10 NOTE — Patient Instructions (Signed)
Check blood sugars 1 x day before breakfast or 2 hrs after one meal every day Bring blood sugar records to the next class  Exercise:  Continue walking  for   30  minutes   5  days a week  Eat 3 meals day, 1-2 snacks a day Space meals 4-6 hours apart  Call your doctor for a prescription for:  1. Meter strips (type) One Touch Verio checking  1     time per day  2. Lancets (type) One Touch Delica checking  1     time per day  Return for classes on:

## 2016-04-10 NOTE — Progress Notes (Signed)
Diabetes Self-Management Education  Visit Type: First/Initial  Appt. Start Time: 1045 Appt. End Time: 1210  04/10/2016  Hannah Rocha, identified by name and date of birth, is a 51 y.o. female with a diagnosis of Diabetes: Type 2.   ASSESSMENT  Blood pressure 140/82, height 5\' 4"  (1.626 m), weight 152 lb 8 oz (69.2 kg), last menstrual period 03/24/2013. Body mass index is 26.18 kg/m.      Diabetes Self-Management Education - 04/10/16 1241      Visit Information   Visit Type First/Initial     Initial Visit   Diabetes Type Type 2   Are you currently following a meal plan? Yes   What type of meal plan do you follow? "no sweets, no soft drinks, eating rye bread, cutting carbs"   Are you taking your medications as prescribed? Yes   Date Diagnosed 2 months ago     Health Coping   How would you rate your overall health? Good     Psychosocial Assessment   Patient Belief/Attitude about Diabetes Motivated to manage diabetes   Self-care barriers None   Self-management support Doctor's office;Family   Patient Concerns Nutrition/Meal planning;Medication;Monitoring;Healthy Lifestyle;Problem Solving;Glycemic Control;Weight Control   Special Needs None   Preferred Learning Style Auditory;Visual;Hands on   [place] in progress   How often do you need to have someone help you when you read instructions, pamphlets, or other written materials from your doctor or pharmacy? 1 - Never   What is the last grade level you completed in school? 12th     Pre-Education Assessment   Patient understands the diabetes disease and treatment process. Needs Instruction   Patient understands incorporating nutritional management into lifestyle. Needs Instruction   Patient undertands incorporating physical activity into lifestyle. Needs Instruction   Patient understands using medications safely. Needs Instruction   Patient understands monitoring blood glucose, interpreting and using  results Needs Instruction   Patient understands prevention, detection, and treatment of acute complications. Needs Instruction   Patient understands prevention, detection, and treatment of chronic complications. Needs Instruction   Patient understands how to develop strategies to address psychosocial issues. Needs Instruction   Patient understands how to develop strategies to promote health/change behavior. Needs Instruction     Complications   Last HgB A1C per patient/outside source 13.6 %  03/09/16   How often do you check your blood sugar? 0 times/day (not testing)  Instructed patient on One Touch Verio. BG upon return demonstration was 119 mg/dL at 11:50 am - 4 hrs pp.   Have you had a dilated eye exam in the past 12 months? Yes   Have you had a dental exam in the past 12 months? Yes   Are you checking your feet? Yes   How many days per week are you checking your feet? 7     Dietary Intake   Breakfast oatmeal, eggs   Lunch chicken sandwich, meat on rye bread, left overs   Snack (afternoon) fruit   Dinner chicken, fish, beef, pork chops, stew with cabbage, broccoli, spinach, crackers   Snack (evening) fruit   Beverage(s) water, unsweetened tea and coffee, Power-Aid     Exercise   Exercise Type Light (walking / raking leaves)   How many days per week to you exercise? 5   How many minutes per day do you exercise? 30   Total minutes per week of exercise 150     Patient Education   Previous Diabetes Education No   Disease  state  Definition of diabetes, type 1 and 2, and the diagnosis of diabetes   Nutrition management  Role of diet in the treatment of diabetes and the relationship between the three main macronutrients and blood glucose level;Carbohydrate counting;Meal timing in regards to the patients' current diabetes medication.   Physical activity and exercise  Role of exercise on diabetes management, blood pressure control and cardiac health.   Medications Reviewed patients  medication for diabetes, action, purpose, timing of dose and side effects.   Monitoring Taught/evaluated SMBG meter.;Purpose and frequency of SMBG.;Taught/discussed recording of test results and interpretation of SMBG.;Identified appropriate SMBG and/or A1C goals.   Chronic complications Relationship between chronic complications and blood glucose control   Psychosocial adjustment Role of stress on diabetes;Identified and addressed patients feelings and concerns about diabetes     Individualized Goals (developed by patient)   Reducing Risk Improve blood sugars Decrease medications Prevent diabetes complications Lose weight Lead a healthier lifestyle Become more fit     Outcomes   Expected Outcomes Demonstrated interest in learning. Expect positive outcomes   Future DMSE 2 wks      Individualized Plan for Diabetes Self-Management Training:   Learning Objective:  Patient will have a greater understanding of diabetes self-management. Patient education plan is to attend individual and/or group sessions per assessed needs and concerns.   Plan:   Patient Instructions  Check blood sugars 1 x day before breakfast or 2 hrs after one meal every day Bring blood sugar records to the next class Exercise:  Continue walking  for   30  minutes   5  days a week Eat 3 meals day, 1-2 snacks a day Space meals 4-6 hours apart Call your doctor for a prescription for:  1. Meter strips (type) One Touch Verio checking  1     time per day  2. Lancets (type) One Touch Delica checking  1     time per day  Expected Outcomes:  Demonstrated interest in learning. Expect positive outcomes  Education material provided:  General Meal Planning Guidelines Simple Meal Plan Meter - One Touch Verio Flex  If problems or questions, patient to contact team via:  Johny Drilling, RN, [place], CDE [phone]  Future DSME appointment: 2 wks  April 27, 2016 for Diabetes Class 1

## 2016-04-13 ENCOUNTER — Encounter: Payer: Self-pay | Admitting: Family Medicine

## 2016-04-14 MED ORDER — GLUCOSE BLOOD VI STRP: ORAL_STRIP | 12 refills | Status: DC

## 2016-04-14 MED ORDER — ONETOUCH DELICA LANCETS 33G MISC: 1 refills | Status: DC

## 2016-04-16 ENCOUNTER — Ambulatory Visit: Payer: BLUE CROSS/BLUE SHIELD | Admitting: Dietician

## 2016-04-27 ENCOUNTER — Encounter: Payer: BLUE CROSS/BLUE SHIELD | Attending: Family Medicine | Admitting: Dietician

## 2016-04-27 ENCOUNTER — Encounter: Payer: Self-pay | Admitting: Dietician

## 2016-04-27 VITALS — Ht 64.0 in | Wt 153.1 lb

## 2016-04-27 DIAGNOSIS — E119 Type 2 diabetes mellitus without complications: Secondary | ICD-10-CM

## 2016-04-27 DIAGNOSIS — Z713 Dietary counseling and surveillance: Secondary | ICD-10-CM | POA: Insufficient documentation

## 2016-04-27 NOTE — Progress Notes (Signed)

## 2016-05-04 ENCOUNTER — Encounter: Payer: BLUE CROSS/BLUE SHIELD | Admitting: Dietician

## 2016-05-04 VITALS — Wt 152.1 lb

## 2016-05-04 DIAGNOSIS — E119 Type 2 diabetes mellitus without complications: Secondary | ICD-10-CM | POA: Diagnosis not present

## 2016-05-04 DIAGNOSIS — Z713 Dietary counseling and surveillance: Secondary | ICD-10-CM | POA: Diagnosis not present

## 2016-05-04 NOTE — Patient Instructions (Signed)

## 2016-05-11 ENCOUNTER — Encounter: Payer: BLUE CROSS/BLUE SHIELD | Admitting: Dietician

## 2016-05-11 ENCOUNTER — Encounter: Payer: Self-pay | Admitting: Dietician

## 2016-05-11 ENCOUNTER — Other Ambulatory Visit (INDEPENDENT_AMBULATORY_CARE_PROVIDER_SITE_OTHER): Payer: BLUE CROSS/BLUE SHIELD | Admitting: *Deleted

## 2016-05-11 VITALS — BP 134/80 | Ht 64.0 in | Wt 150.7 lb

## 2016-05-11 DIAGNOSIS — E119 Type 2 diabetes mellitus without complications: Secondary | ICD-10-CM | POA: Diagnosis not present

## 2016-05-11 DIAGNOSIS — E559 Vitamin D deficiency, unspecified: Secondary | ICD-10-CM

## 2016-05-11 DIAGNOSIS — Z713 Dietary counseling and surveillance: Secondary | ICD-10-CM | POA: Diagnosis not present

## 2016-05-11 NOTE — Progress Notes (Signed)
Pt here today for repeat Vitamin D level.

## 2016-05-11 NOTE — Progress Notes (Signed)

## 2016-05-12 LAB — VITAMIN D 25 HYDROXY (VIT D DEFICIENCY, FRACTURES): VIT D 25 HYDROXY: 45.4 ng/mL (ref 30.0–100.0)

## 2016-05-14 ENCOUNTER — Encounter: Payer: BLUE CROSS/BLUE SHIELD | Admitting: Gastroenterology

## 2016-05-22 ENCOUNTER — Encounter: Payer: Self-pay | Admitting: *Deleted

## 2016-06-01 ENCOUNTER — Other Ambulatory Visit: Payer: Self-pay | Admitting: Family Medicine

## 2016-06-09 ENCOUNTER — Ambulatory Visit: Payer: BLUE CROSS/BLUE SHIELD | Admitting: Family Medicine

## 2016-06-11 ENCOUNTER — Encounter: Payer: Self-pay | Admitting: Family Medicine

## 2016-06-11 ENCOUNTER — Ambulatory Visit (INDEPENDENT_AMBULATORY_CARE_PROVIDER_SITE_OTHER): Payer: BLUE CROSS/BLUE SHIELD | Admitting: Family Medicine

## 2016-06-11 VITALS — BP 116/86 | HR 76 | Temp 98.1°F | Ht 63.0 in | Wt 144.0 lb

## 2016-06-11 DIAGNOSIS — E119 Type 2 diabetes mellitus without complications: Secondary | ICD-10-CM

## 2016-06-11 LAB — COMPREHENSIVE METABOLIC PANEL
ALBUMIN: 4 g/dL (ref 3.5–5.2)
ALT: 14 U/L (ref 0–35)
AST: 12 U/L (ref 0–37)
Alkaline Phosphatase: 54 U/L (ref 39–117)
BUN: 18 mg/dL (ref 6–23)
CHLORIDE: 107 meq/L (ref 96–112)
CO2: 28 mEq/L (ref 19–32)
Calcium: 9.2 mg/dL (ref 8.4–10.5)
Creatinine, Ser: 0.72 mg/dL (ref 0.40–1.20)
GFR: 90.35 mL/min (ref >60.00)
Glucose, Bld: 122 mg/dL — ABNORMAL HIGH (ref 70–99)
POTASSIUM: 4.1 meq/L (ref 3.5–5.1)
SODIUM: 140 meq/L (ref 135–145)
Total Bilirubin: 0.4 mg/dL (ref 0.2–1.2)
Total Protein: 6.6 g/dL (ref 6.0–8.3)

## 2016-06-11 LAB — MICROALBUMIN / CREATININE URINE RATIO
CREATININE, U: 88.7 mg/dL
MICROALB/CREAT RATIO: 1.6 mg/g (ref 0.0–30.0)
Microalb, Ur: 1.4 mg/dL (ref 0.0–1.9)

## 2016-06-11 LAB — HEMOGLOBIN A1C: HEMOGLOBIN A1C: 6.9 % — AB (ref 4.6–6.5)

## 2016-06-11 LAB — LIPID PANEL
Cholesterol: 173 mg/dL (ref 0–200)
HDL: 32.8 mg/dL — AB (ref >39.00)
LDL Cholesterol: 123 mg/dL — ABNORMAL HIGH (ref 0–99)
NonHDL: 139.8
Total CHOL/HDL Ratio: 5
Triglycerides: 86 mg/dL (ref 0.0–149.0)
VLDL: 17.2 mg/dL (ref 0.0–40.0)

## 2016-06-11 MED ORDER — GLUCOSE BLOOD VI STRP: ORAL_STRIP | 12 refills | Status: DC

## 2016-06-11 NOTE — Progress Notes (Signed)
Subjective:   Patient ID: Hannah Rocha, female    DOB: [birth year], 51 y.o.   MRN: [id number]  Sadye Kiernan is a pleasant 51 y.o. year old female who presents to clinic today with Diabetes (follow up)  on 06/11/2016  HPI:  DM- doing well. Taking Metformin 500 mg twice daily. Going to see the diabetic nutritionist- last seen on 05/22/16. Has lost 10 pounds.  "I feel great!"  Checks FSBS twice daily- fasting running 109- 138.  Denies any episodes of hypoglycemia.  Lab Results  Component Value Date   HGBA1C 13.6 (H) 03/09/2016   Wt Readings from Last 3 Encounters:  06/11/16 144 lb (65.3 kg)  05/11/16 150 lb 11.2 oz (68.4 kg)  05/04/16 152 lb 1.6 oz (69 kg)      Current Outpatient Prescriptions on File Prior to Visit  Medication Sig Dispense Refill  . acetaminophen (TYLENOL) 500 MG tablet Take 500 mg by mouth every 6 (six) hours as needed.    . B Complex Vitamins (VITAMIN B COMPLEX PO) Take 1 tablet by mouth daily.    . busPIRone (BUSPAR) 15 MG tablet TAKE 1 TABLET BY MOUTH 3 TIMES DAILY. 270 tablet 1  . flintstones complete (FLINTSTONES) 60 MG chewable tablet Chew 1 tablet by mouth daily.    . fluticasone (FLONASE) 50 MCG/ACT nasal spray USE 2 SPRAYS IN EACH NOSTRIL ONCE A DAY 16 g 4  . glucose blood (ONETOUCH VERIO) test strip Use as instructed to test blood sugar once daily e11.9 100 each 12  . ibuprofen (ADVIL,MOTRIN) 800 MG tablet Take 1 tablet (800 mg total) by mouth every 8 (eight) hours as needed for pain. 60 tablet 1  . metFORMIN (GLUCOPHAGE) 500 MG tablet TAKE 1 TABLET (500 MG TOTAL) BY MOUTH 2 (TWO) TIMES DAILY WITH A MEAL. 60 tablet 2  . ONETOUCH DELICA LANCETS 51Z MISC Use to test blood sugar once daily E11.9 100 each 1  . pantoprazole (PROTONIX) 40 MG tablet Take 1 tablet (40 mg total) by mouth daily. 30 tablet 11  . Vitamin D, Ergocalciferol, (DRISDOL) 50000 units CAPS capsule Take 1 capsule (50,000 Units total) by mouth every 7 (seven) days. For  8 weeks 8 capsule 0   No current facility-administered medications on file prior to visit.     Allergies  Allergen Reactions  . Sertraline Hcl     REACTION: vomiting, severe anxiety    Past Medical History:  Diagnosis Date  . Allergy   . Anemia    hx  . Anxiety   . Diabetes mellitus without complication ([place])   . GERD (gastroesophageal reflux disease)   . H/O hiatal hernia   . Obesity   . PONV (postoperative nausea and vomiting)     Past Surgical History:  Procedure Laterality Date  . ASD REPAIR  1999   [facility]  . BREAST CYST ASPIRATION Right 2013  . CLEFT LIP REPAIR    . LAPAROSCOPIC ASSISTED VAGINAL HYSTERECTOMY Bilateral 04/13/2013   Procedure: LAPAROSCOPIC ASSISTED VAGINAL HYSTERECTOMY;  Surgeon: Emily Filbert, MD;  Location: [facility];  Service: Gynecology;  Laterality: Bilateral;  . LAPAROSCOPIC TUBAL LIGATION  03/21/2012   Procedure: LAPAROSCOPIC TUBAL LIGATION;  Surgeon: Emily Filbert, MD;  Location: [facility];  Service: Gynecology;  Laterality: Bilateral;  . MANDIBLE FRACTURE SURGERY    . Plantar Fascitis Repair  01/2015  . TUBAL LIGATION      Family History  Problem Relation Age of Onset  . Heart disease Mother   .  Colon polyps Mother   . Diabetes Mother   . Prostate cancer Father   . Heart disease Father   . Cancer Father     prostate  . Diabetes Maternal Aunt   . Diabetes Paternal Grandmother   . Colon cancer Neg Hx   . Breast cancer Neg Hx     Social History   Social History  . Marital status: Married    Spouse name: N/A  . Number of children: 0  . Years of education: N/A   Occupational History  . [place]   Social History Main Topics  . Smoking status: Former Smoker    Packs/day: 0.00    Years: 0.00    Types: Cigarettes    Quit date: 02/21/1987  . Smokeless tobacco: Never Used     Comment: 22 years ago-light smoker  . Alcohol use 0.0 oz/week     Comment: occasional - hard apple cider, shot  . Drug use: No  .  Sexual activity: Yes    Partners: Male    Birth control/ protection: Surgical     Comment: tubaligation/hysterctomy   Other Topics Concern  . Not on file   Social History Narrative   Daily caffeine use   The PMH, PSH, Social History, Family History, Medications, and allergies have been reviewed in [facility], and have been updated if relevant.   Review of Systems  Constitutional: Negative.   HENT: Negative.   Respiratory: Negative.   Cardiovascular: Negative.   Gastrointestinal: Negative.   Endocrine: Negative.   Genitourinary: Negative.   Musculoskeletal: Negative.   Allergic/Immunologic: Negative.   Neurological: Negative.   Hematological: Negative.   Psychiatric/Behavioral: Negative.   All other systems reviewed and are negative.      Objective:    BP 116/86   Pulse 76   Temp 98.1 F (36.7 C)   Ht 5\' 3"  (1.6 m)   Wt 144 lb (65.3 kg)   LMP 03/24/2013   BMI 25.51 kg/m    Physical Exam   General:  Well-developed,well-nourished,in no acute distress; alert,appropriate and cooperative throughout examination Head:  normocephalic and atraumatic.   Eyes:  vision grossly intact, PERRL Ears:  R ear normal and L ear normal externally, TMs clear bilaterally Nose:  no external deformity.   Mouth:  good dentition.   Neck:  No deformities, masses, or tenderness noted.  Lungs:  Normal respiratory effort, chest expands symmetrically. Lungs are clear to auscultation, no crackles or wheezes. Heart:  Normal rate and regular rhythm. S1 and S2 normal without gallop, murmur, click, rub or other extra sounds. Msk:  No deformity or scoliosis noted of thoracic or lumbar spine.   Extremities:  No clubbing, cyanosis, edema, or deformity noted with normal full range of motion of all joints.   Neurologic:  alert & oriented X3 and gait normal.   Skin:  Intact without suspicious lesions or rashes Cervical Nodes:  No lymphadenopathy noted Axillary Nodes:  No palpable lymphadenopathy Psych:   Cognition and judgment appear intact. Alert and cooperative with normal attention span and concentration. No apparent delusions, illusions, hallucinations      Assessment & Plan:   Type 2 diabetes mellitus without complication, without long-term current use of insulin (HCC) No Follow-up on file.

## 2016-06-11 NOTE — Assessment & Plan Note (Signed)
Doing well. Has lost 10 pounds!  Changed her diet. Check labs today. Continue current dose of Metformin. The patient indicates understanding of these issues and agrees with the plan.  Orders Placed This Encounter  Procedures  . Hemoglobin A1c  . Microalbumin / creatinine urine ratio  . Lipid panel  . Comprehensive metabolic panel

## 2016-06-11 NOTE — Addendum Note (Signed)
Addended by: Lucille Passy on: 06/11/2016 07:33 AM   Modules accepted: Orders

## 2016-07-20 ENCOUNTER — Ambulatory Visit (INDEPENDENT_AMBULATORY_CARE_PROVIDER_SITE_OTHER): Payer: BLUE CROSS/BLUE SHIELD | Admitting: Family Medicine

## 2016-07-20 ENCOUNTER — Encounter: Payer: Self-pay | Admitting: Family Medicine

## 2016-07-20 VITALS — BP 136/82 | HR 80 | Temp 98.3°F | Wt 145.0 lb

## 2016-07-20 DIAGNOSIS — J069 Acute upper respiratory infection, unspecified: Secondary | ICD-10-CM

## 2016-07-20 DIAGNOSIS — B9789 Other viral agents as the cause of diseases classified elsewhere: Secondary | ICD-10-CM

## 2016-07-20 MED ORDER — HYDROCODONE-HOMATROPINE 5-1.5 MG/5ML PO SYRP: 5.0000 mL | ORAL_SOLUTION | Freq: Every evening | ORAL | 0 refills | Status: DC | PRN

## 2016-07-20 NOTE — Progress Notes (Signed)
Pre visit review using our clinic review tool, if applicable. No additional management support is needed unless otherwise documented below in the visit note. 

## 2016-07-20 NOTE — Progress Notes (Signed)
Subjective:    Patient ID: Hannah Rocha, female    DOB: July 07, 1965, 51 y.o.   MRN: [id number]  HPI This is a 51 yo female who presents today with cough x 5 days, worse at night. Was productive at first, not as much now. Does not feel congested in chest.  Taking Delsym without relief. No nasal drainage, no post nasal drainage, no sore throat, no fever, had some ear pain that has resolved. No headache, no myalgias. No wheezing. Has used inhalers in the past, none at home.   Past Medical History:  Diagnosis Date  . Allergy   . Anemia    hx  . Anxiety   . Diabetes mellitus without complication ([place])   . GERD (gastroesophageal reflux disease)   . H/O hiatal hernia   . Obesity   . PONV (postoperative nausea and vomiting)    Past Surgical History:  Procedure Laterality Date  . ASD REPAIR  1999   [facility]  . BREAST CYST ASPIRATION Right 2013  . CLEFT LIP REPAIR    . LAPAROSCOPIC ASSISTED VAGINAL HYSTERECTOMY Bilateral 04/13/2013   Procedure: LAPAROSCOPIC ASSISTED VAGINAL HYSTERECTOMY;  Surgeon: Emily Filbert, MD;  Location: [facility];  Service: Gynecology;  Laterality: Bilateral;  . LAPAROSCOPIC TUBAL LIGATION  03/21/2012   Procedure: LAPAROSCOPIC TUBAL LIGATION;  Surgeon: Emily Filbert, MD;  Location: [facility];  Service: Gynecology;  Laterality: Bilateral;  . MANDIBLE FRACTURE SURGERY    . Plantar Fascitis Repair  01/2015  . TUBAL LIGATION     Family History  Problem Relation Age of Onset  . Heart disease Mother   . Colon polyps Mother   . Diabetes Mother   . Prostate cancer Father   . Heart disease Father   . Cancer Father     prostate  . Diabetes Maternal Aunt   . Diabetes Paternal Grandmother   . Colon cancer Neg Hx   . Breast cancer Neg Hx    Social History  Substance Use Topics  . Smoking status: Former Smoker    Packs/day: 0.00    Years: 0.00    Types: Cigarettes    Quit date: 02/21/1987  . Smokeless tobacco: Never Used     Comment: 22 years ago-light smoker  .  Alcohol use 0.0 oz/week     Comment: occasional - hard apple cider, shot      Review of Systems Per HPI    Objective:   Physical Exam  Constitutional: She is oriented to person, place, and time. She appears well-developed and well-nourished. No distress.  HENT:  Head: Normocephalic and atraumatic.  Right Ear: Tympanic membrane, external ear and ear canal normal.  Left Ear: Tympanic membrane, external ear and ear canal normal.  Nose: Nose normal.  Mouth/Throat: Uvula is midline. No oropharyngeal exudate, posterior oropharyngeal edema or posterior oropharyngeal erythema.  Cleft palate repair.   Eyes: Conjunctivae are normal.  Neck: Normal range of motion. Neck supple.  Cardiovascular: Normal rate, regular rhythm and normal heart sounds.   Pulmonary/Chest: Effort normal and breath sounds normal.  Lymphadenopathy:    She has no cervical adenopathy.  Neurological: She is alert and oriented to person, place, and time.  Skin: Skin is warm and dry. She is not diaphoretic.  Psychiatric: She has a normal mood and affect. Her behavior is normal. Judgment and thought content normal.  Vitals reviewed.     BP 136/82 (BP Location: Right Arm, Patient Position: Sitting, Cuff Size: Normal)   Pulse 80  Temp 98.3 F (36.8 C) (Oral)   Wt 145 lb (65.8 kg)   LMP 03/24/2013   SpO2 99%   BMI 25.69 kg/m  Wt Readings from Last 3 Encounters:  07/20/16 145 lb (65.8 kg)  06/11/16 144 lb (65.3 kg)  05/11/16 150 lb 11.2 oz (68.4 kg)   .    Assessment & Plan:  1. Viral URI with cough - afebrile, lungs clear - symptomatic treatment instructions and RTC instructions provided - HYDROcodone-homatropine (HYCODAN) 5-1.5 MG/5ML syrup; Take 5 mLs by mouth at bedtime as needed for cough.  Dispense: 60 mL; Refill: 0  Clarene Reamer, FNP-BC  [facility]  07/20/2016 9:11 AM

## 2016-07-20 NOTE — Patient Instructions (Signed)
For nasal congestion you can use Afrin nasal spray for 3 days max, Sudafed, saline nasal spray (generic is fine for all). If you feel congested in chest, you can take plain Mucinex Drink enough fluids to make your urine light yellow. For fever/chill/muscle aches you can take over the counter acetaminophen or ibuprofen.  Please come back in if you are not better in 5-7 days or if you develop wheezing, shortness of breath or persistent vomiting.

## 2016-08-23 ENCOUNTER — Other Ambulatory Visit: Payer: Self-pay | Admitting: Family Medicine

## 2016-09-10 ENCOUNTER — Ambulatory Visit (INDEPENDENT_AMBULATORY_CARE_PROVIDER_SITE_OTHER): Payer: BLUE CROSS/BLUE SHIELD | Admitting: Family Medicine

## 2016-09-10 ENCOUNTER — Ambulatory Visit: Payer: BLUE CROSS/BLUE SHIELD | Admitting: Family Medicine

## 2016-09-10 ENCOUNTER — Encounter: Payer: Self-pay | Admitting: Family Medicine

## 2016-09-10 VITALS — BP 120/70 | HR 75 | Wt 143.0 lb

## 2016-09-10 DIAGNOSIS — E119 Type 2 diabetes mellitus without complications: Secondary | ICD-10-CM

## 2016-09-10 DIAGNOSIS — F419 Anxiety disorder, unspecified: Secondary | ICD-10-CM | POA: Diagnosis not present

## 2016-09-10 LAB — HEMOGLOBIN A1C: Hgb A1c MFr Bld: 6.3 % (ref 4.6–6.5)

## 2016-09-10 MED ORDER — BUSPIRONE HCL 15 MG PO TABS: ORAL_TABLET | ORAL | 3 refills | Status: DC

## 2016-09-10 NOTE — Progress Notes (Signed)
Subjective:   Patient ID: Hannah Rocha, female    DOB: July 03, 1965, 51 y.o.   MRN: [id number]  Hannah Rocha is a pleasant 51 y.o. year old female who presents to clinic today with Anxiety and Diabetes  on 09/10/2016  HPI:  DM- doing well. Taking Metformin 500 mg twice daily. Going to see the diabetic nutritionist. Has lost  Pounds 20 pounds now.  "I feel great!"  Checks FSBS twice daily- fasting running 97 -154.  Denies any episodes of hypoglycemia.  Lab Results  Component Value Date   HGBA1C 6.9 (H) 06/11/2016     Anxiety- Has been well controlled on Buspar 15 mg three times daily but has more job stressors now.  Noticing more anxiety. Sleeping okay. Does not feel depressed.  Current Outpatient Prescriptions on File Prior to Visit  Medication Sig Dispense Refill  . acetaminophen (TYLENOL) 500 MG tablet Take 500 mg by mouth every 6 (six) hours as needed.    . B Complex Vitamins (VITAMIN B COMPLEX PO) Take 1 tablet by mouth daily.    . flintstones complete (FLINTSTONES) 60 MG chewable tablet Chew 1 tablet by mouth daily.    . fluticasone (FLONASE) 50 MCG/ACT nasal spray USE 2 SPRAYS IN EACH NOSTRIL ONCE A DAY 16 g 4  . glucose blood (ONETOUCH VERIO) test strip Use as instructed to test blood sugar twice daily e11.9 100 each 12  . ibuprofen (ADVIL,MOTRIN) 800 MG tablet Take 1 tablet (800 mg total) by mouth every 8 (eight) hours as needed for pain. 60 tablet 1  . metFORMIN (GLUCOPHAGE) 500 MG tablet TAKE 1 TABLET (500 MG TOTAL) BY MOUTH 2 (TWO) TIMES DAILY WITH A MEAL. 60 tablet 0  . ONETOUCH DELICA LANCETS 23N MISC Use to test blood sugar once daily E11.9 100 each 1  . pantoprazole (PROTONIX) 40 MG tablet Take 1 tablet (40 mg total) by mouth daily. 30 tablet 11   No current facility-administered medications on file prior to visit.     Allergies  Allergen Reactions  . Sertraline Hcl     REACTION: vomiting, severe anxiety    Past Medical History:    Diagnosis Date  . Allergy   . Anemia    hx  . Anxiety   . Diabetes mellitus without complication ([place])   . GERD (gastroesophageal reflux disease)   . H/O hiatal hernia   . Obesity   . PONV (postoperative nausea and vomiting)     Past Surgical History:  Procedure Laterality Date  . ASD REPAIR  1999   [facility]  . BREAST CYST ASPIRATION Right 2013  . CLEFT LIP REPAIR    . LAPAROSCOPIC ASSISTED VAGINAL HYSTERECTOMY Bilateral 04/13/2013   Procedure: LAPAROSCOPIC ASSISTED VAGINAL HYSTERECTOMY;  Surgeon: Emily Filbert, MD;  Location: [facility];  Service: Gynecology;  Laterality: Bilateral;  . LAPAROSCOPIC TUBAL LIGATION  03/21/2012   Procedure: LAPAROSCOPIC TUBAL LIGATION;  Surgeon: Emily Filbert, MD;  Location: [facility];  Service: Gynecology;  Laterality: Bilateral;  . MANDIBLE FRACTURE SURGERY    . Plantar Fascitis Repair  01/2015  . TUBAL LIGATION      Family History  Problem Relation Age of Onset  . Heart disease Mother   . Colon polyps Mother   . Diabetes Mother   . Prostate cancer Father   . Heart disease Father   . Cancer Father        prostate  . Diabetes Maternal Aunt   . Diabetes Paternal Grandmother   .  Colon cancer Neg Hx   . Breast cancer Neg Hx     Social History   Social History  . Marital status: Married    Spouse name: N/A  . Number of children: 0  . Years of education: N/A   Occupational History  . [employer]   Social History Main Topics  . Smoking status: Former Smoker    Packs/day: 0.00    Years: 0.00    Types: Cigarettes    Quit date: 02/21/1987  . Smokeless tobacco: Never Used     Comment: 22 years ago-light smoker  . Alcohol use 0.0 oz/week     Comment: occasional - hard apple cider, shot  . Drug use: No  . Sexual activity: Yes    Partners: Male    Birth control/ protection: Surgical     Comment: tubaligation/hysterctomy   Other Topics Concern  . Not on file   Social History Narrative   Daily caffeine use   The  PMH, PSH, Social History, Family History, Medications, and allergies have been reviewed in [facility], and have been updated if relevant.   Review of Systems  Endocrine: Negative.   Psychiatric/Behavioral: Negative for agitation, behavioral problems, confusion, decreased concentration, dysphoric mood, hallucinations, self-injury, sleep disturbance and suicidal ideas. The patient is nervous/anxious. The patient is not hyperactive.   All other systems reviewed and are negative.      Objective:    BP 120/70   Pulse 75   Wt 143 lb (64.9 kg)   LMP 03/24/2013   SpO2 98%   BMI 25.33 kg/m  Wt Readings from Last 3 Encounters:  09/10/16 143 lb (64.9 kg)  07/20/16 145 lb (65.8 kg)  06/11/16 144 lb (65.3 kg)     Physical Exam  Constitutional: She is oriented to person, place, and time. She appears well-developed and well-nourished. No distress.  HENT:  Head: Normocephalic and atraumatic.  Eyes: Conjunctivae are normal.  Neck: Normal range of motion.  Cardiovascular: Normal rate.   Pulmonary/Chest: Effort normal.  Musculoskeletal: Normal range of motion.  Neurological: She is alert and oriented to person, place, and time. No cranial nerve deficit.  Skin: Skin is warm and dry. She is not diaphoretic.  Psychiatric: She has a normal mood and affect. Her behavior is normal. Judgment and thought content normal.  Nursing note and vitals reviewed.         Assessment & Plan:   Anxiety  Type 2 diabetes mellitus without complication, without long-term current use of insulin ([place]) - Plan: Hemoglobin A1c No Follow-up on file.

## 2016-09-10 NOTE — Patient Instructions (Addendum)
Great to see you. We will call you with your results from today.  We are increasing your Buspar to 30 mg every morning, 15 mg at lunch and 15 mg at bedtime.  Please keep me updated.

## 2016-09-10 NOTE — Assessment & Plan Note (Signed)
Doing well with diet and weight loss. Tolerating Metformin well. a1c today.

## 2016-09-10 NOTE — Progress Notes (Signed)
Pre visit review using our clinic review tool, if applicable. No additional management support is needed unless otherwise documented below in the visit note. 

## 2016-09-10 NOTE — Assessment & Plan Note (Signed)
>  25 minutes spent in face to face time with patient, >50% spent in counselling or coordination of care Deteriorated. Increase Buspar to 30 mg every morning, 15 mg at lunch and 15 mg at bedtime. Call or return to clinic prn if these symptoms worsen or fail to improve as anticipated. The patient indicates understanding of these issues and agrees with the plan.

## 2016-09-20 ENCOUNTER — Other Ambulatory Visit: Payer: Self-pay | Admitting: Family Medicine

## 2016-10-10 ENCOUNTER — Other Ambulatory Visit: Payer: Self-pay | Admitting: Obstetrics & Gynecology

## 2016-10-10 DIAGNOSIS — R7989 Other specified abnormal findings of blood chemistry: Secondary | ICD-10-CM

## 2016-10-22 ENCOUNTER — Other Ambulatory Visit: Payer: Self-pay | Admitting: Family Medicine

## 2016-10-29 ENCOUNTER — Other Ambulatory Visit: Payer: Self-pay | Admitting: Family Medicine

## 2016-10-29 MED ORDER — BUSPIRONE HCL 15 MG PO TABS: ORAL_TABLET | ORAL | 1 refills | Status: DC

## 2016-11-30 ENCOUNTER — Encounter: Payer: Self-pay | Admitting: Family Medicine

## 2016-11-30 ENCOUNTER — Ambulatory Visit (INDEPENDENT_AMBULATORY_CARE_PROVIDER_SITE_OTHER): Payer: BLUE CROSS/BLUE SHIELD | Admitting: Family Medicine

## 2016-11-30 VITALS — BP 132/74 | HR 89 | Temp 98.8°F | Resp 16 | Wt 147.8 lb

## 2016-11-30 DIAGNOSIS — J069 Acute upper respiratory infection, unspecified: Secondary | ICD-10-CM

## 2016-11-30 MED ORDER — BENZONATATE 200 MG PO CAPS: 200.0000 mg | ORAL_CAPSULE | Freq: Two times a day (BID) | ORAL | 0 refills | Status: DC | PRN

## 2016-11-30 NOTE — Progress Notes (Signed)
SUBJECTIVE:  Hannah Rocha is a 51 y.o. female who complains of coryza, congestion, sneezing and productive cough for 3 days. She denies a history of anorexia and chest pain and denies a history of asthma. Patient denies smoke cigarettes.   Current Outpatient Prescriptions on File Prior to Visit  Medication Sig Dispense Refill  . acetaminophen (TYLENOL) 500 MG tablet Take 500 mg by mouth every 6 (six) hours as needed.    . B Complex Vitamins (VITAMIN B COMPLEX PO) Take 1 tablet by mouth daily.    . busPIRone (BUSPAR) 15 MG tablet 2 tablets by mouth every morning, 1 tablet by mouth at lunch and 1 tablet by mouth at bedtime 270 tablet 1  . flintstones complete (FLINTSTONES) 60 MG chewable tablet Chew 1 tablet by mouth daily.    . fluticasone (FLONASE) 50 MCG/ACT nasal spray USE 2 SPRAYS IN EACH NOSTRIL ONCE A DAY 16 g 4  . glucose blood (ONETOUCH VERIO) test strip Use as instructed to test blood sugar twice daily e11.9 100 each 12  . ibuprofen (ADVIL,MOTRIN) 800 MG tablet Take 1 tablet (800 mg total) by mouth every 8 (eight) hours as needed for pain. 60 tablet 1  . metFORMIN (GLUCOPHAGE) 500 MG tablet TAKE 1 TABLET (500 MG TOTAL) BY MOUTH 2 (TWO) TIMES DAILY WITH A MEAL. 60 tablet 3  . ONETOUCH DELICA LANCETS 27X MISC Use to test blood sugar once daily E11.9 100 each 1  . pantoprazole (PROTONIX) 40 MG tablet Take 1 tablet (40 mg total) by mouth daily. 30 tablet 11   No current facility-administered medications on file prior to visit.     Allergies  Allergen Reactions  . Sertraline Hcl     REACTION: vomiting, severe anxiety    Past Medical History:  Diagnosis Date  . Allergy   . Anemia    hx  . Anxiety   . Diabetes mellitus without complication ([place])   . GERD (gastroesophageal reflux disease)   . H/O hiatal hernia   . Obesity   . PONV (postoperative nausea and vomiting)     Past Surgical History:  Procedure Laterality Date  . ASD REPAIR  1999   [facility]  . BREAST CYST  ASPIRATION Right 2013  . CLEFT LIP REPAIR    . LAPAROSCOPIC ASSISTED VAGINAL HYSTERECTOMY Bilateral 04/13/2013   Procedure: LAPAROSCOPIC ASSISTED VAGINAL HYSTERECTOMY;  Surgeon: Emily Filbert, MD;  Location: [facility];  Service: Gynecology;  Laterality: Bilateral;  . LAPAROSCOPIC TUBAL LIGATION  03/21/2012   Procedure: LAPAROSCOPIC TUBAL LIGATION;  Surgeon: Emily Filbert, MD;  Location: [facility];  Service: Gynecology;  Laterality: Bilateral;  . MANDIBLE FRACTURE SURGERY    . Plantar Fascitis Repair  01/2015  . TUBAL LIGATION      Family History  Problem Relation Age of Onset  . Heart disease Mother   . Colon polyps Mother   . Diabetes Mother   . Prostate cancer Father   . Heart disease Father   . Cancer Father        prostate  . Diabetes Maternal Aunt   . Diabetes Paternal Grandmother   . Colon cancer Neg Hx   . Breast cancer Neg Hx     Social History   Social History  . Marital status: Married    Spouse name: N/A  . Number of children: 0  . Years of education: N/A   Occupational History  . [employer]   Social History Main Topics  . Smoking  status: Former Smoker    Packs/day: 0.00    Years: 0.00    Types: Cigarettes    Quit date: 02/21/1987  . Smokeless tobacco: Never Used     Comment: 22 years ago-light smoker  . Alcohol use 0.0 oz/week     Comment: occasional - hard apple cider, shot  . Drug use: No  . Sexual activity: Yes    Partners: Male    Birth control/ protection: Surgical     Comment: tubaligation/hysterctomy   Other Topics Concern  . Not on file   Social History Narrative   Daily caffeine use   The PMH, PSH, Social History, Family History, Medications, and allergies have been reviewed in [facility], and have been updated if relevant.  OBJECTIVE: BP 132/74   Pulse 89   Temp 98.8 F (37.1 C) (Oral)   Resp 16   Wt 147 lb 12.8 oz (67 kg)   LMP 03/24/2013   SpO2 98%   BMI 26.18 kg/m   She appears well, vital signs are as noted.  Ears normal.  Throat and pharynx normal.  Neck supple. No adenopathy in the neck. Nose is congested. Sinuses non tender. The chest is clear, without wheezes or rales.  ASSESSMENT:  viral upper respiratory illness  PLAN: Symptomatic therapy suggested: push fluids, rest and return office visit prn if symptoms persist or worsen. Lack of antibiotic effectiveness discussed with her. Call or return to clinic prn if these symptoms worsen or fail to improve as anticipated.

## 2016-12-11 ENCOUNTER — Encounter: Payer: Self-pay | Admitting: *Deleted

## 2016-12-17 ENCOUNTER — Ambulatory Visit (INDEPENDENT_AMBULATORY_CARE_PROVIDER_SITE_OTHER): Payer: BLUE CROSS/BLUE SHIELD | Admitting: Family Medicine

## 2016-12-17 ENCOUNTER — Encounter: Payer: Self-pay | Admitting: Family Medicine

## 2016-12-17 VITALS — BP 130/72 | HR 62 | Ht 64.0 in | Wt 147.0 lb

## 2016-12-17 DIAGNOSIS — Z23 Encounter for immunization: Secondary | ICD-10-CM | POA: Diagnosis not present

## 2016-12-17 DIAGNOSIS — E119 Type 2 diabetes mellitus without complications: Secondary | ICD-10-CM

## 2016-12-17 DIAGNOSIS — F419 Anxiety disorder, unspecified: Secondary | ICD-10-CM | POA: Diagnosis not present

## 2016-12-17 DIAGNOSIS — R5383 Other fatigue: Secondary | ICD-10-CM

## 2016-12-17 LAB — CBC WITH DIFFERENTIAL/PLATELET
BASOS ABS: 0 10*3/uL (ref 0.0–0.1)
BASOS PCT: 0.6 % (ref 0.0–3.0)
EOS ABS: 0.1 10*3/uL (ref 0.0–0.7)
Eosinophils Relative: 1.6 % (ref 0.0–5.0)
HCT: 36.9 % (ref 36.0–46.0)
Hemoglobin: 12.4 g/dL (ref 12.0–15.0)
LYMPHS ABS: 1.4 10*3/uL (ref 0.7–4.0)
Lymphocytes Relative: 29.3 % (ref 12.0–46.0)
MCHC: 33.5 g/dL (ref 30.0–36.0)
MCV: 95.1 fl (ref 78.0–100.0)
Monocytes Absolute: 0.3 10*3/uL (ref 0.1–1.0)
Monocytes Relative: 7 % (ref 3.0–12.0)
NEUTROS ABS: 3 10*3/uL (ref 1.4–7.7)
NEUTROS PCT: 61.5 % (ref 43.0–77.0)
PLATELETS: 176 10*3/uL (ref 150.0–400.0)
RBC: 3.88 Mil/uL (ref 3.87–5.11)
RDW: 12.6 % (ref 11.5–15.5)
WBC: 4.8 10*3/uL (ref 4.0–10.5)

## 2016-12-17 LAB — COMPREHENSIVE METABOLIC PANEL
ALT: 25 U/L (ref 0–35)
AST: 16 U/L (ref 0–37)
Albumin: 4.1 g/dL (ref 3.5–5.2)
Alkaline Phosphatase: 50 U/L (ref 39–117)
BILIRUBIN TOTAL: 0.5 mg/dL (ref 0.2–1.2)
BUN: 18 mg/dL (ref 6–23)
CO2: 30 meq/L (ref 19–32)
CREATININE: 0.71 mg/dL (ref 0.40–1.20)
Calcium: 9.4 mg/dL (ref 8.4–10.5)
Chloride: 105 mEq/L (ref 96–112)
GFR: 91.63 mL/min (ref >60.00)
GLUCOSE: 117 mg/dL — AB (ref 70–99)
Potassium: 4.2 mEq/L (ref 3.5–5.1)
SODIUM: 139 meq/L (ref 135–145)
Total Protein: 6.6 g/dL (ref 6.0–8.3)

## 2016-12-17 LAB — TSH: TSH: 2.89 u[IU]/mL (ref 0.35–4.50)

## 2016-12-17 LAB — VITAMIN B12: VITAMIN B 12: 731 pg/mL (ref 211–911)

## 2016-12-17 LAB — HEMOGLOBIN A1C: HEMOGLOBIN A1C: 6.3 % (ref 4.6–6.5)

## 2016-12-17 MED ORDER — BUSPIRONE HCL 15 MG PO TABS: ORAL_TABLET | ORAL | 6 refills | Status: DC

## 2016-12-17 NOTE — Assessment & Plan Note (Signed)
Continue current rx. Well controlled- check labs today.

## 2016-12-17 NOTE — Progress Notes (Signed)
Subjective:   Patient ID: Hannah Rocha, female    DOB: 1965-10-02, 51 y.o.   MRN: [id number]  Laurin Paulo is a pleasant 51 y.o. year old female who presents to clinic today with Follow-up  on 12/17/2016  HPI:  DM- doing well. Taking Metformin 500 mg twice daily. Saw the diabetic nutritionist. Has lost  Pounds 20 pounds now and has maintained this weight.  Checks FSBS twice daily- fasting running 91 -145.  Denies any episodes of hypoglycemia.  Lab Results  Component Value Date   HGBA1C 6.3 09/10/2016     Anxiety- Has been well controlled on Buspar 15 mg four times daily which has helped tremendously with anxiety.  Current Outpatient Prescriptions on File Prior to Visit  Medication Sig Dispense Refill  . acetaminophen (TYLENOL) 500 MG tablet Take 500 mg by mouth every 6 (six) hours as needed.    . B Complex Vitamins (VITAMIN B COMPLEX PO) Take 1 tablet by mouth daily.    . benzonatate (TESSALON) 200 MG capsule Take 1 capsule (200 mg total) by mouth 2 (two) times daily as needed for cough. 20 capsule 0  . Dextromethorphan-Guaifenesin (DELSYM COUGH/CHEST CONGEST DM) 5-100 MG/5ML LIQD Take by mouth.    . flintstones complete (FLINTSTONES) 60 MG chewable tablet Chew 1 tablet by mouth daily.    . fluticasone (FLONASE) 50 MCG/ACT nasal spray USE 2 SPRAYS IN EACH NOSTRIL ONCE A DAY 16 g 4  . glucose blood (ONETOUCH VERIO) test strip Use as instructed to test blood sugar twice daily e11.9 100 each 12  . ibuprofen (ADVIL,MOTRIN) 800 MG tablet Take 1 tablet (800 mg total) by mouth every 8 (eight) hours as needed for pain. 60 tablet 1  . metFORMIN (GLUCOPHAGE) 500 MG tablet TAKE 1 TABLET (500 MG TOTAL) BY MOUTH 2 (TWO) TIMES DAILY WITH A MEAL. 60 tablet 3  . ONETOUCH DELICA LANCETS 10G MISC Use to test blood sugar once daily E11.9 100 each 1  . pantoprazole (PROTONIX) 40 MG tablet Take 1 tablet (40 mg total) by mouth daily. 30 tablet 11   No current  facility-administered medications on file prior to visit.     Allergies  Allergen Reactions  . Sertraline Hcl     REACTION: vomiting, severe anxiety    Past Medical History:  Diagnosis Date  . Allergy   . Anemia    hx  . Anxiety   . Diabetes mellitus without complication ([place])   . GERD (gastroesophageal reflux disease)   . H/O hiatal hernia   . Obesity   . PONV (postoperative nausea and vomiting)     Past Surgical History:  Procedure Laterality Date  . ASD REPAIR  1999   [facility]  . BREAST CYST ASPIRATION Right 2013  . CLEFT LIP REPAIR    . LAPAROSCOPIC ASSISTED VAGINAL HYSTERECTOMY Bilateral 04/13/2013   Procedure: LAPAROSCOPIC ASSISTED VAGINAL HYSTERECTOMY;  Surgeon: Emily Filbert, MD;  Location: [facility];  Service: Gynecology;  Laterality: Bilateral;  . LAPAROSCOPIC TUBAL LIGATION  03/21/2012   Procedure: LAPAROSCOPIC TUBAL LIGATION;  Surgeon: Emily Filbert, MD;  Location: [facility];  Service: Gynecology;  Laterality: Bilateral;  . MANDIBLE FRACTURE SURGERY    . Plantar Fascitis Repair  01/2015  . TUBAL LIGATION      Family History  Problem Relation Age of Onset  . Heart disease Mother   . Colon polyps Mother   . Diabetes Mother   . Prostate cancer Father   . Heart disease Father   .  Cancer Father        prostate  . Diabetes Maternal Aunt   . Diabetes Paternal Grandmother   . Colon cancer Neg Hx   . Breast cancer Neg Hx     Social History   Social History  . Marital status: Married    Spouse name: N/A  . Number of children: 0  . Years of education: N/A   Occupational History  . [employer]   Social History Main Topics  . Smoking status: Former Smoker    Packs/day: 0.00    Years: 0.00    Types: Cigarettes    Quit date: 02/21/1987  . Smokeless tobacco: Never Used     Comment: 22 years ago-light smoker  . Alcohol use 0.0 oz/week     Comment: occasional - hard apple cider, shot  . Drug use: No  . Sexual activity: Yes    Partners:  Male    Birth control/ protection: Surgical     Comment: tubaligation/hysterctomy   Other Topics Concern  . Not on file   Social History Narrative   Daily caffeine use   The PMH, PSH, Social History, Family History, Medications, and allergies have been reviewed in [facility], and have been updated if relevant.   Review of Systems  Constitutional: Positive for fatigue.  Respiratory: Negative.   Endocrine: Negative.   Genitourinary: Negative.   Neurological: Negative.   Psychiatric/Behavioral: Negative for agitation, behavioral problems, confusion, decreased concentration, dysphoric mood, hallucinations, self-injury, sleep disturbance and suicidal ideas. The patient is not nervous/anxious and is not hyperactive.   All other systems reviewed and are negative.      Objective:    BP 130/72   Pulse 62   Ht 5\' 4"  (1.626 m)   Wt 147 lb (66.7 kg)   LMP 03/24/2013   SpO2 98%   BMI 25.23 kg/m  Wt Readings from Last 3 Encounters:  12/17/16 147 lb (66.7 kg)  11/30/16 147 lb 12.8 oz (67 kg)  09/10/16 143 lb (64.9 kg)     Physical Exam  Constitutional: She is oriented to person, place, and time. She appears well-developed and well-nourished. No distress.  HENT:  Head: Normocephalic and atraumatic.  Eyes: Conjunctivae are normal.  Neck: Normal range of motion.  Cardiovascular: Normal rate.   Pulmonary/Chest: Effort normal.  Musculoskeletal: Normal range of motion.  Neurological: She is alert and oriented to person, place, and time. No cranial nerve deficit.  Skin: Skin is warm and dry. She is not diaphoretic.  Psychiatric: She has a normal mood and affect. Her behavior is normal. Judgment and thought content normal.  Nursing note and vitals reviewed.         Assessment & Plan:   Type 2 diabetes mellitus without complication, without long-term current use of insulin (HCC) - Plan: Hemoglobin A1c, Comprehensive metabolic panel, CBC with Differential/Platelet,  TSH  Anxiety  Other fatigue - Plan: Vitamin B12 No Follow-up on file.

## 2016-12-17 NOTE — Assessment & Plan Note (Signed)
Improved with increased dose of Buspar. No changes made.

## 2017-01-31 ENCOUNTER — Other Ambulatory Visit: Payer: Self-pay | Admitting: Family Medicine

## 2017-02-02 DIAGNOSIS — D229 Melanocytic nevi, unspecified: Secondary | ICD-10-CM | POA: Diagnosis not present

## 2017-02-02 DIAGNOSIS — D485 Neoplasm of uncertain behavior of skin: Secondary | ICD-10-CM | POA: Diagnosis not present

## 2017-02-02 DIAGNOSIS — Z1283 Encounter for screening for malignant neoplasm of skin: Secondary | ICD-10-CM | POA: Diagnosis not present

## 2017-02-02 DIAGNOSIS — L814 Other melanin hyperpigmentation: Secondary | ICD-10-CM | POA: Diagnosis not present

## 2017-02-15 ENCOUNTER — Encounter: Payer: Self-pay | Admitting: Nurse Practitioner

## 2017-02-15 ENCOUNTER — Ambulatory Visit: Payer: BLUE CROSS/BLUE SHIELD | Admitting: Nurse Practitioner

## 2017-02-15 VITALS — BP 140/80 | HR 91 | Temp 98.8°F | Ht 64.0 in | Wt 146.0 lb

## 2017-02-15 DIAGNOSIS — J069 Acute upper respiratory infection, unspecified: Secondary | ICD-10-CM | POA: Diagnosis not present

## 2017-02-15 MED ORDER — CHLORPHEN-PE-ACETAMINOPHEN 4-10-325 MG PO TABS: 1.0000 | ORAL_TABLET | Freq: Three times a day (TID) | ORAL | 0 refills | Status: DC

## 2017-02-15 MED ORDER — HYDROCODONE-HOMATROPINE 5-1.5 MG/5ML PO SYRP: 5.0000 mL | ORAL_SOLUTION | Freq: Three times a day (TID) | ORAL | 0 refills | Status: DC | PRN

## 2017-02-15 MED ORDER — IPRATROPIUM BROMIDE 0.03 % NA SOLN: 2.0000 | Freq: Two times a day (BID) | NASAL | 0 refills | Status: DC

## 2017-02-15 NOTE — Patient Instructions (Signed)

## 2017-02-15 NOTE — Progress Notes (Signed)
Subjective:  Patient ID: Hannah Rocha, female    DOB: [birth year]  Age: 51 y.o. MRN: [id number]  CC: Nasal Congestion (congestion,bad coughing little brow mucus, ear stop up, 2 days. took OTC: tussin DM, benzonatate doesnt work/ cant sleep/ hx of bronchitis and pneumonia--make sure nothing wrong)   URI   This is a new problem. The current episode started in the past 7 days. The problem has been unchanged. There has been no fever. Associated symptoms include congestion, coughing, ear pain, headaches, a plugged ear sensation, rhinorrhea, sinus pain, sneezing, a sore throat and swollen glands. Pertinent negatives include no chest pain, nausea, vomiting or wheezing. She has tried acetaminophen and increased fluids for the symptoms.    Outpatient Medications Prior to Visit  Medication Sig Dispense Refill  . acetaminophen (TYLENOL) 500 MG tablet Take 500 mg by mouth every 6 (six) hours as needed.    . B Complex Vitamins (VITAMIN B COMPLEX PO) Take 1 tablet by mouth daily.    . busPIRone (BUSPAR) 15 MG tablet 2 tablets by mouth every morning, 1 tablet by mouth at lunch and 1 tablet by mouth at bedtime 270 tablet 6  . Dextromethorphan-Guaifenesin (DELSYM COUGH/CHEST CONGEST DM) 5-100 MG/5ML LIQD Take by mouth.    . flintstones complete (FLINTSTONES) 60 MG chewable tablet Chew 1 tablet by mouth daily.    . fluticasone (FLONASE) 50 MCG/ACT nasal spray USE 2 SPRAYS IN EACH NOSTRIL ONCE A DAY 16 g 4  . glucose blood (ONETOUCH VERIO) test strip Use as instructed to test blood sugar twice daily e11.9 100 each 12  . ibuprofen (ADVIL,MOTRIN) 800 MG tablet Take 1 tablet (800 mg total) by mouth every 8 (eight) hours as needed for pain. 60 tablet 1  . metFORMIN (GLUCOPHAGE) 500 MG tablet TAKE 1 TABLET (500 MG TOTAL) BY MOUTH 2 (TWO) TIMES DAILY WITH A MEAL. 60 tablet 1  . ONETOUCH DELICA LANCETS 62X MISC Use to test blood sugar once daily E11.9 100 each 1  . pantoprazole (PROTONIX) 40 MG tablet Take 1  tablet (40 mg total) by mouth daily. 30 tablet 11  . benzonatate (TESSALON) 200 MG capsule Take 1 capsule (200 mg total) by mouth 2 (two) times daily as needed for cough. (Patient not taking: Reported on 02/15/2017) 20 capsule 0   No facility-administered medications prior to visit.     ROS See HPI  Objective:  BP 140/80   Pulse 91   Temp 98.8 F (37.1 C)   Ht 5\' 4"  (1.626 m)   Wt 146 lb (66.2 kg)   LMP 03/24/2013   SpO2 98%   BMI 25.06 kg/m   BP Readings from Last 3 Encounters:  02/15/17 140/80  12/17/16 130/72  11/30/16 132/74    Wt Readings from Last 3 Encounters:  02/15/17 146 lb (66.2 kg)  12/17/16 147 lb (66.7 kg)  11/30/16 147 lb 12.8 oz (67 kg)    Physical Exam  Constitutional: She is oriented to person, place, and time.  HENT:  Right Ear: External ear and ear canal normal. No mastoid tenderness. A middle ear effusion is present.  Left Ear: External ear and ear canal normal. No mastoid tenderness. A middle ear effusion is present.  Nose: Mucosal edema and rhinorrhea present. Right sinus exhibits maxillary sinus tenderness. Right sinus exhibits no frontal sinus tenderness. Left sinus exhibits maxillary sinus tenderness. Left sinus exhibits no frontal sinus tenderness.  Mouth/Throat: Uvula is midline. No trismus in the jaw. Posterior oropharyngeal erythema present. No  oropharyngeal exudate.  Eyes: No scleral icterus.  Neck: Normal range of motion. Neck supple.  Cardiovascular: Normal rate and normal heart sounds.  Pulmonary/Chest: Effort normal and breath sounds normal.  Musculoskeletal: She exhibits no edema.  Lymphadenopathy:    She has no cervical adenopathy.  Neurological: She is alert and oriented to person, place, and time.  Vitals reviewed.   Lab Results  Component Value Date   WBC 4.8 12/17/2016   HGB 12.4 12/17/2016   HCT 36.9 12/17/2016   PLT 176.0 12/17/2016   GLUCOSE 117 (H) 12/17/2016   CHOL 173 06/11/2016   TRIG 86.0 06/11/2016   HDL  32.80 (L) 06/11/2016   LDLDIRECT 169.0 03/09/2016   LDLCALC 123 (H) 06/11/2016   ALT 25 12/17/2016   AST 16 12/17/2016   NA 139 12/17/2016   K 4.2 12/17/2016   CL 105 12/17/2016   CREATININE 0.71 12/17/2016   BUN 18 12/17/2016   CO2 30 12/17/2016   TSH 2.89 12/17/2016   HGBA1C 6.3 12/17/2016   MICROALBUR 1.4 06/11/2016    Mm Digital Screening Bilateral  Result Date: 04/01/2016 CLINICAL DATA:  Screening. EXAM: DIGITAL SCREENING BILATERAL MAMMOGRAM WITH CAD COMPARISON:  Previous exam(s). ACR Breast Density Category c: The breast tissue is heterogeneously dense, which may obscure small masses. FINDINGS: There are no findings suspicious for malignancy. Images were processed with CAD. IMPRESSION: No mammographic evidence of malignancy. A result letter of this screening mammogram will be mailed directly to the patient. RECOMMENDATION: Screening mammogram in one year. (Code:SM-B-01Y) BI-RADS CATEGORY  1: Negative. Electronically Signed   By: Dorise Bullion III M.D   On: 04/01/2016 08:51    Assessment & Plan:   Hannah Rocha was seen today for nasal congestion.  Diagnoses and all orders for this visit:  Acute URI -     Chlorphen-PE-Acetaminophen 4-10-325 MG TABS; Take 1 tablet by mouth every 8 (eight) hours. -     ipratropium (ATROVENT) 0.03 % nasal spray; Place 2 sprays into both nostrils 2 (two) times daily. Do not use for more than 5days. -     HYDROcodone-homatropine (HYCODAN) 5-1.5 MG/5ML syrup; Take 5 mLs by mouth every 8 (eight) hours as needed for cough.   I am having Hannah Rocha start on Chlorphen-PE-Acetaminophen, ipratropium, and HYDROcodone-homatropine. I am also having her maintain her flintstones complete, ibuprofen, fluticasone, acetaminophen, B Complex Vitamins (VITAMIN B COMPLEX PO), pantoprazole, ONETOUCH DELICA LANCETS 16X, glucose blood, Dextromethorphan-Guaifenesin, benzonatate, busPIRone, and metFORMIN.  Meds ordered this encounter  Medications  .  Chlorphen-PE-Acetaminophen 4-10-325 MG TABS    Sig: Take 1 tablet by mouth every 8 (eight) hours.    Dispense:  9 tablet    Refill:  0    Order Specific Question:   Supervising Provider    Answer:   Lucille Passy [[id number]]  . ipratropium (ATROVENT) 0.03 % nasal spray    Sig: Place 2 sprays into both nostrils 2 (two) times daily. Do not use for more than 5days.    Dispense:  30 mL    Refill:  0    Order Specific Question:   Supervising Provider    Answer:   Lucille Passy [[id number]]  . HYDROcodone-homatropine (HYCODAN) 5-1.5 MG/5ML syrup    Sig: Take 5 mLs by mouth every 8 (eight) hours as needed for cough.    Dispense:  30 mL    Refill:  0    Order Specific Question:   Supervising Provider    Answer:   Lucille Passy [[id number]]  Follow-up: Return if symptoms worsen or fail to improve.  Wilfred Lacy, NP

## 2017-02-16 ENCOUNTER — Other Ambulatory Visit: Payer: Self-pay

## 2017-02-16 ENCOUNTER — Telehealth: Payer: Self-pay | Admitting: Family Medicine

## 2017-02-16 MED ORDER — BUSPIRONE HCL 30 MG PO TABS: ORAL_TABLET | ORAL | 1 refills | Status: DC

## 2017-02-16 NOTE — Telephone Encounter (Signed)
See request °

## 2017-02-16 NOTE — Progress Notes (Signed)
Changed buspar to 30mg  1qam; 1/2@noon ;1/2qhs per TA/thx dmf

## 2017-02-16 NOTE — Telephone Encounter (Signed)
See phone note/completed/thx dmf

## 2017-02-16 NOTE — Telephone Encounter (Signed)
Copied from [place]. Topic: Quick Communication - See Telephone Encounter >> Feb 16, 2017  9:10 AM Robina Ade, Helene Kelp D wrote: CRM for notification. See Telephone encounter for: 02/16/17. Dalia from [facility] in [place] called and said that the medication  busPIRone (BUSPAR) 15 MG tablet  is backed order and she is requesting for either 7.5mg  or 30mg  for patient. Please call back, [phone].

## 2017-03-12 ENCOUNTER — Ambulatory Visit (INDEPENDENT_AMBULATORY_CARE_PROVIDER_SITE_OTHER): Payer: BLUE CROSS/BLUE SHIELD | Admitting: Obstetrics & Gynecology

## 2017-03-12 ENCOUNTER — Encounter: Payer: Self-pay | Admitting: Obstetrics & Gynecology

## 2017-03-12 VITALS — BP 124/81 | HR 83 | Ht 62.0 in | Wt 147.0 lb

## 2017-03-12 DIAGNOSIS — Z01419 Encounter for gynecological examination (general) (routine) without abnormal findings: Secondary | ICD-10-CM

## 2017-03-12 MED ORDER — IBUPROFEN 800 MG PO TABS: 800.0000 mg | ORAL_TABLET | Freq: Three times a day (TID) | ORAL | 12 refills | Status: DC | PRN

## 2017-03-12 NOTE — Progress Notes (Signed)
Subjective:    Hannah Rocha is a 51 y.o. married P0  female who presents for an annual exam. The patient has no complaints today. She has worked on weight loss and has lost 30 # with portion control. The patient is sexually active. GYN screening history: last pap: was normal. The patient wears seatbelts: yes. The patient participates in regular exercise: yes. Has the patient ever been transfused or tattooed?: no. The patient reports that there is not domestic violence in her life.   Menstrual History: OB History    Gravida Para Term Preterm AB Living   1 0 0 0 1 0   SAB TAB Ectopic Multiple Live Births   0 1 0 0        Menarche age: 21 Patient's last menstrual period was 03/24/2013.    The following portions of the patient's history were reviewed and updated as appropriate: allergies, current medications, past family history, past medical history, past social history, past surgical history and problem list.  Review of Systems Pertinent items are noted in HPI.   Monogamous for 30 years, denies dyspareunia Has had flu vaccine FH- no breast/gyn/colon cancer Mammogram due 1/19   Objective:    BP 124/81   Pulse 83   Ht 5\' 2"  (1.575 m)   Wt 147 lb (66.7 kg)   LMP 03/24/2013   BMI 26.89 kg/m   General Appearance:    Alert, cooperative, no distress, appears stated age  Head:    Normocephalic, without obvious abnormality, atraumatic  Eyes:    PERRL, conjunctiva/corneas clear, EOM's intact, fundi    benign, both eyes  Ears:    Normal TM's and external ear canals, both ears  Nose:   Nares normal, septum midline, mucosa normal, no drainage    or sinus tenderness  Throat:   Lips, mucosa, and tongue normal; teeth and gums normal  Neck:   Supple, symmetrical, trachea midline, no adenopathy;    thyroid:  no enlargement/tenderness/nodules; no carotid   bruit or JVD  Back:     Symmetric, no curvature, ROM normal, no CVA tenderness  Lungs:     Clear to auscultation bilaterally,  respirations unlabored  Chest Wall:    No tenderness or deformity   Heart:    Regular rate and rhythm, S1 and S2 normal, no murmur, rub   or gallop  Breast Exam:    No tenderness, masses, or nipple abnormality  Abdomen:     Soft, non-tender, bowel sounds active all four quadrants,    no masses, no organomegaly  Genitalia:    Normal female without lesion, discharge or tenderness, cuff normal, no masses with bimanual exam     Extremities:   Extremities normal, atraumatic, no cyanosis or edema  Pulses:   2+ and symmetric all extremities  Skin:   Skin color, texture, turgor normal, no rashes or lesions  Lymph nodes:   Cervical, supraclavicular, and axillary nodes normal  Neurologic:   CNII-XII intact, normal strength, sensation and reflexes    throughout  .    Assessment:    Healthy female exam.    Plan:     Thin prep Pap smear. with cotesting mammogram

## 2017-03-19 ENCOUNTER — Ambulatory Visit (INDEPENDENT_AMBULATORY_CARE_PROVIDER_SITE_OTHER): Payer: BLUE CROSS/BLUE SHIELD

## 2017-03-19 ENCOUNTER — Encounter: Payer: Self-pay | Admitting: Nurse Practitioner

## 2017-03-19 ENCOUNTER — Ambulatory Visit: Payer: BLUE CROSS/BLUE SHIELD | Admitting: Nurse Practitioner

## 2017-03-19 VITALS — BP 130/70 | HR 91 | Temp 99.5°F | Ht 62.0 in | Wt 152.0 lb

## 2017-03-19 DIAGNOSIS — J208 Acute bronchitis due to other specified organisms: Secondary | ICD-10-CM | POA: Diagnosis not present

## 2017-03-19 DIAGNOSIS — R0989 Other specified symptoms and signs involving the circulatory and respiratory systems: Secondary | ICD-10-CM | POA: Diagnosis not present

## 2017-03-19 DIAGNOSIS — J101 Influenza due to other identified influenza virus with other respiratory manifestations: Secondary | ICD-10-CM | POA: Diagnosis not present

## 2017-03-19 DIAGNOSIS — R062 Wheezing: Secondary | ICD-10-CM | POA: Diagnosis not present

## 2017-03-19 LAB — POCT INFLUENZA A/B
INFLUENZA A, POC: POSITIVE — AB
INFLUENZA B, POC: NEGATIVE

## 2017-03-19 MED ORDER — DM-GUAIFENESIN ER 30-600 MG PO TB12: 1.0000 | ORAL_TABLET | Freq: Two times a day (BID) | ORAL | 0 refills | Status: DC | PRN

## 2017-03-19 MED ORDER — ALBUTEROL SULFATE HFA 108 (90 BASE) MCG/ACT IN AERS: 1.0000 | INHALATION_SPRAY | Freq: Four times a day (QID) | RESPIRATORY_TRACT | 0 refills | Status: DC | PRN

## 2017-03-19 MED ORDER — CHLORPHEN-PE-ACETAMINOPHEN 4-10-325 MG PO TABS: 1.0000 | ORAL_TABLET | Freq: Three times a day (TID) | ORAL | 0 refills | Status: DC

## 2017-03-19 MED ORDER — HYDROCODONE-HOMATROPINE 5-1.5 MG/5ML PO SYRP: 5.0000 mL | ORAL_SOLUTION | Freq: Four times a day (QID) | ORAL | 0 refills | Status: DC | PRN

## 2017-03-19 MED ORDER — OSELTAMIVIR PHOSPHATE 75 MG PO CAPS: 75.0000 mg | ORAL_CAPSULE | Freq: Two times a day (BID) | ORAL | 0 refills | Status: DC

## 2017-03-19 NOTE — Progress Notes (Signed)
Subjective:  Patient ID: Hannah Rocha, female    DOB: 1965/09/03  Age: 51 y.o. MRN: [id number]  CC: Cough (coughing,bodyache,hot and cold--2 days. )   URI   This is a new problem. The current episode started in the past 7 days. The problem has been gradually worsening. The maximum temperature recorded prior to her arrival was 100.4 - 100.9 F. Associated symptoms include chest pain, congestion, coughing, headaches, joint pain, a plugged ear sensation, rhinorrhea, sinus pain, sneezing, a sore throat, swollen glands and wheezing. She has tried acetaminophen and decongestant for the symptoms. The treatment provided no relief.    Outpatient Medications Prior to Visit  Medication Sig Dispense Refill  . acetaminophen (TYLENOL) 500 MG tablet Take 500 mg by mouth every 6 (six) hours as needed.    . B Complex Vitamins (VITAMIN B COMPLEX PO) Take 1 tablet by mouth daily.    . busPIRone (BUSPAR) 30 MG tablet 1qam, 1/2 at lunch, and 1/2qhs 180 tablet 1  . flintstones complete (FLINTSTONES) 60 MG chewable tablet Chew 1 tablet by mouth daily.    . fluticasone (FLONASE) 50 MCG/ACT nasal spray USE 2 SPRAYS IN EACH NOSTRIL ONCE A DAY 16 g 4  . glucose blood (ONETOUCH VERIO) test strip Use as instructed to test blood sugar twice daily e11.9 100 each 12  . ibuprofen (ADVIL,MOTRIN) 800 MG tablet Take 1 tablet (800 mg total) by mouth every 8 (eight) hours as needed. 60 tablet 12  . metFORMIN (GLUCOPHAGE) 500 MG tablet TAKE 1 TABLET (500 MG TOTAL) BY MOUTH 2 (TWO) TIMES DAILY WITH A MEAL. 60 tablet 1  . ONETOUCH DELICA LANCETS 09F MISC Use to test blood sugar once daily E11.9 100 each 1  . pantoprazole (PROTONIX) 40 MG tablet Take 1 tablet (40 mg total) by mouth daily. 30 tablet 11  . Chlorphen-PE-Acetaminophen 4-10-325 MG TABS Take 1 tablet by mouth every 8 (eight) hours. 9 tablet 0   No facility-administered medications prior to visit.     ROS See HPI  Objective:  BP 130/70   Pulse 91   Temp  99.5 F (37.5 C)   Ht 5\' 2"  (1.575 m)   Wt 152 lb (68.9 kg)   LMP 03/24/2013   SpO2 96%   BMI 27.80 kg/m   BP Readings from Last 3 Encounters:  03/19/17 130/70  03/12/17 124/81  02/15/17 140/80    Wt Readings from Last 3 Encounters:  03/19/17 152 lb (68.9 kg)  03/12/17 147 lb (66.7 kg)  02/15/17 146 lb (66.2 kg)    Physical Exam  Constitutional: She is oriented to person, place, and time. No distress.  HENT:  Right Ear: Tympanic membrane, external ear and ear canal normal.  Left Ear: Tympanic membrane, external ear and ear canal normal.  Nose: Mucosal edema and rhinorrhea present. Right sinus exhibits maxillary sinus tenderness and frontal sinus tenderness. Left sinus exhibits maxillary sinus tenderness and frontal sinus tenderness.  Mouth/Throat: Uvula is midline. Posterior oropharyngeal erythema present. No oropharyngeal exudate.  Cardiovascular: Normal rate and regular rhythm.  Pulmonary/Chest: Effort normal. No respiratory distress. She has wheezes. She has rales.  Neurological: She is alert and oriented to person, place, and time.  Vitals reviewed.   Lab Results  Component Value Date   WBC 4.8 12/17/2016   HGB 12.4 12/17/2016   HCT 36.9 12/17/2016   PLT 176.0 12/17/2016   GLUCOSE 117 (H) 12/17/2016   CHOL 173 06/11/2016   TRIG 86.0 06/11/2016   HDL 32.80 (L)  06/11/2016   LDLDIRECT 169.0 03/09/2016   LDLCALC 123 (H) 06/11/2016   ALT 25 12/17/2016   AST 16 12/17/2016   NA 139 12/17/2016   K 4.2 12/17/2016   CL 105 12/17/2016   CREATININE 0.71 12/17/2016   BUN 18 12/17/2016   CO2 30 12/17/2016   TSH 2.89 12/17/2016   HGBA1C 6.3 12/17/2016   MICROALBUR 1.4 06/11/2016    Mm Digital Screening Bilateral  Result Date: 04/01/2016 CLINICAL DATA:  Screening. EXAM: DIGITAL SCREENING BILATERAL MAMMOGRAM WITH CAD COMPARISON:  Previous exam(s). ACR Breast Density Category c: The breast tissue is heterogeneously dense, which may obscure small masses. FINDINGS: There  are no findings suspicious for malignancy. Images were processed with CAD. IMPRESSION: No mammographic evidence of malignancy. A result letter of this screening mammogram will be mailed directly to the patient. RECOMMENDATION: Screening mammogram in one year. (Code:SM-B-01Y) BI-RADS CATEGORY  1: Negative. Electronically Signed   By: Dorise Bullion III M.D   On: 04/01/2016 08:51    Assessment & Plan:   Hannah Rocha was seen today for cough.  Diagnoses and all orders for this visit:  Influenza A -     POCT Influenza A/B -     DG Chest 2 View -     oseltamivir (TAMIFLU) 75 MG capsule; Take 1 capsule (75 mg total) by mouth 2 (two) times daily. -     Chlorphen-PE-Acetaminophen 4-10-325 MG TABS; Take 1 tablet by mouth every 8 (eight) hours. -     dextromethorphan-guaiFENesin (MUCINEX DM) 30-600 MG 12hr tablet; Take 1 tablet by mouth 2 (two) times daily as needed for cough. -     HYDROcodone-homatropine (HYCODAN) 5-1.5 MG/5ML syrup; Take 5 mLs by mouth every 6 (six) hours as needed for cough. -     albuterol (PROVENTIL HFA;VENTOLIN HFA) 108 (90 Base) MCG/ACT inhaler; Inhale 1-2 puffs into the lungs every 6 (six) hours as needed.  Acute bronchitis due to other specified organisms -     DG Chest 2 View -     albuterol (PROVENTIL HFA;VENTOLIN HFA) 108 (90 Base) MCG/ACT inhaler; Inhale 1-2 puffs into the lungs every 6 (six) hours as needed.   I am having Hannah Rocha start on oseltamivir, dextromethorphan-guaiFENesin, HYDROcodone-homatropine, and albuterol. I am also having her maintain her flintstones complete, fluticasone, acetaminophen, B Complex Vitamins (VITAMIN B COMPLEX PO), pantoprazole, ONETOUCH DELICA LANCETS 84Z, glucose blood, metFORMIN, busPIRone, ibuprofen, and Chlorphen-PE-Acetaminophen.  Meds ordered this encounter  Medications  . oseltamivir (TAMIFLU) 75 MG capsule    Sig: Take 1 capsule (75 mg total) by mouth 2 (two) times daily.    Dispense:  10 capsule    Refill:  0    Order  Specific Question:   Supervising Provider    Answer:   Lucille Passy [[id number]]  . Chlorphen-PE-Acetaminophen 4-10-325 MG TABS    Sig: Take 1 tablet by mouth every 8 (eight) hours.    Dispense:  9 tablet    Refill:  0    Order Specific Question:   Supervising Provider    Answer:   Lucille Passy [[id number]]  . dextromethorphan-guaiFENesin (MUCINEX DM) 30-600 MG 12hr tablet    Sig: Take 1 tablet by mouth 2 (two) times daily as needed for cough.    Dispense:  14 tablet    Refill:  0    Order Specific Question:   Supervising Provider    Answer:   Lucille Passy [[id number]]  . HYDROcodone-homatropine (HYCODAN) 5-1.5 MG/5ML syrup    Sig:  Take 5 mLs by mouth every 6 (six) hours as needed for cough.    Dispense:  60 mL    Refill:  0    Order Specific Question:   Supervising Provider    Answer:   Lucille Passy [[id number]]  . albuterol (PROVENTIL HFA;VENTOLIN HFA) 108 (90 Base) MCG/ACT inhaler    Sig: Inhale 1-2 puffs into the lungs every 6 (six) hours as needed.    Dispense:  1 Inhaler    Refill:  0    Order Specific Question:   Supervising Provider    Answer:   Lucille Passy [[id number]]    Follow-up: Return if symptoms worsen or fail to improve.  Wilfred Lacy, NP

## 2017-03-19 NOTE — Patient Instructions (Addendum)
Normal CXR.  Encourage adequate oral hydration. Return to office if no improvement in 2weeks.  Influenza, Adult Influenza, more commonly known as "the flu," is a viral infection that primarily affects the respiratory tract. The respiratory tract includes organs that help you breathe, such as the lungs, nose, and throat. The flu causes many common cold symptoms, as well as a high fever and body aches. The flu spreads easily from person to person (is contagious). Getting a flu shot (influenza vaccination) every year is the best way to prevent influenza. What are the causes? Influenza is caused by a virus. You can catch the virus by:  Breathing in droplets from an infected person's cough or sneeze.  Touching something that was recently contaminated with the virus and then touching your mouth, nose, or eyes.  What increases the risk? The following factors may make you more likely to get the flu:  Not cleaning your hands frequently with soap and water or alcohol-based hand sanitizer.  Having close contact with many people during cold and flu season.  Touching your mouth, eyes, or nose without washing or sanitizing your hands first.  Not drinking enough fluids or not eating a healthy diet.  Not getting enough sleep or exercise.  Being under a high amount of stress.  Not getting a yearly (annual) flu shot.  You may be at a higher risk of complications from the flu, such as a severe lung infection (pneumonia), if you:  Are over the age of 49.  Are pregnant.  Have a weakened disease-fighting system (immune system). You may have a weakened immune system if you: ? Have HIV or AIDS. ? Are undergoing chemotherapy. ? Aretaking medicines that reduce the activity of (suppress) the immune system.  Have a long-term (chronic) illness, such as heart disease, kidney disease, diabetes, or lung disease.  Have a liver disorder.  Are obese.  Have anemia.  What are the signs or  symptoms? Symptoms of this condition typically last 4-10 days and may include:  Fever.  Chills.  Headache, body aches, or muscle aches.  Sore throat.  Cough.  Runny or congested nose.  Chest discomfort and cough.  Poor appetite.  Weakness or tiredness (fatigue).  Dizziness.  Nausea or vomiting.  How is this diagnosed? This condition may be diagnosed based on your medical history and a physical exam. Your health care provider may do a nose or throat swab test to confirm the diagnosis. How is this treated? If influenza is detected early, you can be treated with antiviral medicine that can reduce the length of your illness and the severity of your symptoms. This medicine may be given by mouth (orally) or through an IV tube that is inserted in one of your veins. The goal of treatment is to relieve symptoms by taking care of yourself at home. This may include taking over-the-counter medicines, drinking plenty of fluids, and adding humidity to the air in your home. In some cases, influenza goes away on its own. Severe influenza or complications from influenza may be treated in a hospital. Follow these instructions at home:  Take over-the-counter and prescription medicines only as told by your health care provider.  Use a cool mist humidifier to add humidity to the air in your home. This can make breathing easier.  Rest as needed.  Drink enough fluid to keep your urine clear or pale yellow.  Cover your mouth and nose when you cough or sneeze.  Wash your hands with soap and water often,  especially after you cough or sneeze. If soap and water are not available, use hand sanitizer.  Stay home from work or school as told by your health care provider. Unless you are visiting your health care provider, try to avoid leaving home until your fever has been gone for 24 hours without the use of medicine.  Keep all follow-up visits as told by your health care provider. This is  important. How is this prevented?  Getting an annual flu shot is the best way to avoid getting the flu. You may get the flu shot in late summer, fall, or winter. Ask your health care provider when you should get your flu shot.  Wash your hands often or use hand sanitizer often.  Avoid contact with people who are sick during cold and flu season.  Eat a healthy diet, drink plenty of fluids, get enough sleep, and exercise regularly. Contact a health care provider if:  You develop new symptoms.  You have: ? Chest pain. ? Diarrhea. ? A fever.  Your cough gets worse.  You produce more mucus.  You feel nauseous or you vomit. Get help right away if:  You develop shortness of breath or difficulty breathing.  Your skin or nails turn a bluish color.  You have severe pain or stiffness in your neck.  You develop a sudden headache or sudden pain in your face or ear.  You cannot stop vomiting. This information is not intended to replace advice given to you by your health care provider. Make sure you discuss any questions you have with your health care provider. Document Released: 03/06/2000 Document Revised: 08/15/2015 Document Reviewed: 01/01/2015 Elsevier Interactive Patient Education  2017 [employer].

## 2017-03-22 ENCOUNTER — Ambulatory Visit: Payer: BLUE CROSS/BLUE SHIELD | Admitting: Family Medicine

## 2017-03-22 ENCOUNTER — Encounter: Payer: Self-pay | Admitting: Family Medicine

## 2017-03-22 VITALS — BP 116/66 | HR 60 | Temp 97.9°F | Ht 62.0 in | Wt 151.0 lb

## 2017-03-22 DIAGNOSIS — E119 Type 2 diabetes mellitus without complications: Secondary | ICD-10-CM

## 2017-03-22 LAB — HEMOGLOBIN A1C: Hgb A1c MFr Bld: 6.7 % — ABNORMAL HIGH (ref 4.6–6.5)

## 2017-03-22 NOTE — Assessment & Plan Note (Signed)
Continue current rx. Check a1c - consider decreasing Metformin to 500 mg daily based on results of a1c. The patient indicates understanding of these issues and agrees with the plan.

## 2017-03-22 NOTE — Progress Notes (Signed)
Subjective:   Patient ID: Hannah Rocha, female    DOB: [birth year], 51 y.o.   MRN: [id number]  Hannah Rocha is a pleasant 51 y.o. year old female who presents to clinic today with Diabetes (Patient is here today to F/U with DM2.  She is currently fasting.)  on 03/22/2017  HPI:  DM- doing well. Taking Metformin 500 mg twice daily. Saw the diabetic nutritionist. Has lost  Pounds 20 pounds now and has maintained this weight.  Wt Readings from Last 3 Encounters:  03/22/17 151 lb (68.5 kg)  03/19/17 152 lb (68.9 kg)  03/12/17 147 lb (66.7 kg)      Checks FSBS twice daily- fasting running 90-131.  Denies any episodes of hypoglycemia.  Lab Results  Component Value Date   HGBA1C 6.3 12/17/2016     Current Outpatient Medications on File Prior to Visit  Medication Sig Dispense Refill  . acetaminophen (TYLENOL) 500 MG tablet Take 500 mg by mouth every 6 (six) hours as needed.    Marland Kitchen albuterol (PROVENTIL HFA;VENTOLIN HFA) 108 (90 Base) MCG/ACT inhaler Inhale 1-2 puffs into the lungs every 6 (six) hours as needed. 1 Inhaler 0  . B Complex Vitamins (VITAMIN B COMPLEX PO) Take 1 tablet by mouth daily.    . busPIRone (BUSPAR) 30 MG tablet 1qam, 1/2 at lunch, and 1/2qhs 180 tablet 1  . Chlorphen-PE-Acetaminophen 4-10-325 MG TABS Take 1 tablet by mouth every 8 (eight) hours. 9 tablet 0  . dextromethorphan-guaiFENesin (MUCINEX DM) 30-600 MG 12hr tablet Take 1 tablet by mouth 2 (two) times daily as needed for cough. 14 tablet 0  . flintstones complete (FLINTSTONES) 60 MG chewable tablet Chew 1 tablet by mouth daily.    . fluticasone (FLONASE) 50 MCG/ACT nasal spray USE 2 SPRAYS IN EACH NOSTRIL ONCE A DAY 16 g 4  . glucose blood (ONETOUCH VERIO) test strip Use as instructed to test blood sugar twice daily e11.9 100 each 12  . HYDROcodone-homatropine (HYCODAN) 5-1.5 MG/5ML syrup Take 5 mLs by mouth every 6 (six) hours as needed for cough. 60 mL 0  . ibuprofen (ADVIL,MOTRIN)  800 MG tablet Take 1 tablet (800 mg total) by mouth every 8 (eight) hours as needed. 60 tablet 12  . metFORMIN (GLUCOPHAGE) 500 MG tablet TAKE 1 TABLET (500 MG TOTAL) BY MOUTH 2 (TWO) TIMES DAILY WITH A MEAL. 60 tablet 1  . ONETOUCH DELICA LANCETS 01S MISC Use to test blood sugar once daily E11.9 100 each 1  . oseltamivir (TAMIFLU) 75 MG capsule Take 1 capsule (75 mg total) by mouth 2 (two) times daily. 10 capsule 0  . pantoprazole (PROTONIX) 40 MG tablet Take 1 tablet (40 mg total) by mouth daily. 30 tablet 11   No current facility-administered medications on file prior to visit.     Allergies  Allergen Reactions  . Sertraline Hcl     REACTION: vomiting, severe anxiety    Past Medical History:  Diagnosis Date  . Allergy   . Anemia    hx  . Anxiety   . Diabetes mellitus without complication ([place])   . GERD (gastroesophageal reflux disease)   . H/O hiatal hernia   . Obesity   . PONV (postoperative nausea and vomiting)     Past Surgical History:  Procedure Laterality Date  . ASD REPAIR  1999   [facility]  . BREAST CYST ASPIRATION Right 2013  . CLEFT LIP REPAIR    . LAPAROSCOPIC ASSISTED VAGINAL HYSTERECTOMY Bilateral 04/13/2013   Procedure:  LAPAROSCOPIC ASSISTED VAGINAL HYSTERECTOMY;  Surgeon: Emily Filbert, MD;  Location: [facility];  Service: Gynecology;  Laterality: Bilateral;  . LAPAROSCOPIC TUBAL LIGATION  03/21/2012   Procedure: LAPAROSCOPIC TUBAL LIGATION;  Surgeon: Emily Filbert, MD;  Location: [facility];  Service: Gynecology;  Laterality: Bilateral;  . MANDIBLE FRACTURE SURGERY    . Plantar Fascitis Repair  01/2015  . TUBAL LIGATION      Family History  Problem Relation Age of Onset  . Heart disease Mother   . Colon polyps Mother   . Diabetes Mother   . Prostate cancer Father   . Heart disease Father   . Cancer Father        prostate  . Diabetes Maternal Aunt   . Diabetes Paternal Grandmother   . Colon cancer Neg Hx   . Breast cancer Neg Hx     Social History    Socioeconomic History  . Marital status: Married    Spouse name: Not on file  . Number of children: 0  . Years of education: Not on file  . Highest education level: Not on file  Social Needs  . Financial resource strain: Not on file  . Food insecurity - worry: Not on file  . Food insecurity - inability: Not on file  . Transportation needs - medical: Not on file  . Transportation needs - non-medical: Not on file  Occupational History  . 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    Employer: [employer], nutritional  Tobacco Use  . Smoking status: Former Smoker    Packs/day: 0.00    Years: 0.00    Pack years: 0.00    Types: Cigarettes    Last attempt to quit: 02/21/1987    Years since quitting: 30.1  . Smokeless tobacco: Never Used  . Tobacco comment: 22 years ago-light smoker  Substance and Sexual Activity  . Alcohol use: Yes    Alcohol/week: 0.0 oz    Comment: occasional - hard apple cider, shot  . Drug use: No  . Sexual activity: Yes    Partners: Male    Birth control/protection: Surgical    Comment: tubaligation/hysterctomy  Other Topics Concern  . Not on file  Social History Narrative   Daily caffeine use   The PMH, PSH, Social History, Family History, Medications, and allergies have been reviewed in [facility], and have been updated if relevant.  Review of Systems  Constitutional: Negative.   HENT: Negative.   Respiratory: Negative.   Cardiovascular: Negative.   Gastrointestinal: Negative.   Genitourinary: Negative.   Musculoskeletal: Negative.   Neurological: Negative.   Hematological: Negative.   Psychiatric/Behavioral: Negative.   All other systems reviewed and are negative.      Objective:    BP 116/66 (BP Location: Left Arm, Patient Position: Sitting, Cuff Size: Normal)   Pulse 60   Temp 97.9 F (36.6 C) (Oral)   Ht 5\' 2"  (1.575 m)   Wt 151 lb (68.5 kg)   LMP 03/24/2013   SpO2 98%   BMI 27.62 kg/m    Physical Exam  Constitutional: She is oriented to  person, place, and time. She appears well-developed and well-nourished. No distress.  HENT:  Head: Normocephalic and atraumatic.  Eyes: Conjunctivae are normal.  Cardiovascular: Normal rate and regular rhythm.  Pulmonary/Chest: Effort normal and breath sounds normal.  Musculoskeletal: Normal range of motion. She exhibits no edema.  Neurological: She is alert and oriented to person, place, and time. No cranial nerve deficit.  Skin: Skin is warm and  dry. She is not diaphoretic.  Psychiatric: She has a normal mood and affect. Her behavior is normal. Judgment and thought content normal.  Nursing note and vitals reviewed.         Assessment & Plan:   Type 2 diabetes mellitus without complication, without long-term current use of insulin ([place]) - Plan: Hemoglobin A1c No Follow-up on file.

## 2017-03-24 ENCOUNTER — Ambulatory Visit: Payer: BLUE CROSS/BLUE SHIELD | Admitting: Gastroenterology

## 2017-03-24 ENCOUNTER — Encounter: Payer: Self-pay | Admitting: Gastroenterology

## 2017-03-24 VITALS — BP 150/80 | HR 78 | Ht 62.0 in | Wt 147.6 lb

## 2017-03-24 DIAGNOSIS — K219 Gastro-esophageal reflux disease without esophagitis: Secondary | ICD-10-CM | POA: Diagnosis not present

## 2017-03-24 DIAGNOSIS — Z8371 Family history of colonic polyps: Secondary | ICD-10-CM

## 2017-03-24 MED ORDER — PANTOPRAZOLE SODIUM 40 MG PO TBEC: 40.0000 mg | DELAYED_RELEASE_TABLET | Freq: Every day | ORAL | 11 refills | Status: DC

## 2017-03-24 NOTE — Progress Notes (Signed)
    History of Present Illness: This is a 52 year old female returning for follow-up of GERD.  Her reflux symptoms are well controlled on daily pantoprazole.  She did not follow through with scheduling colonoscopy after her visit in December 2017 to evaluate Hemoccult positive stool hematochezia and a family history of colon polyps.  She states she had financial concerns and she decided not to proceed with colonoscopy.  Her rectal bleeding has since abated.  Current Medications, Allergies, Past Medical History, Past Surgical History, Family History and Social History were reviewed in [employer] record.  Physical Exam: General: Well developed, well nourished, no acute distress Head: Normocephalic and atraumatic Eyes:  sclerae anicteric, EOMI Ears: Normal auditory acuity Mouth: No deformity or lesions Lungs: Clear throughout to auscultation Heart: Regular rate and rhythm; no murmurs, rubs or bruits Abdomen: Soft, non tender and non distended. No masses, hepatosplenomegaly or hernias noted. Normal Bowel sounds Musculoskeletal: Symmetrical with no gross deformities  Pulses:  Normal pulses noted Extremities: No clubbing, cyanosis, edema or deformities noted Neurological: Alert oriented x 4, grossly nonfocal Psychological:  Alert and cooperative. Normal mood and affect  Assessment and Recommendations:  1. GERD.  Follow antireflux measures and continue pantoprazole 40 mg daily.  2. Family history of colon polyps.  Given her family history I advised her to proceed with colonoscopy as her last colonoscopy was performed in 2011.  Her rectal bleeding has resolved.  She states she may schedule colonoscopy later this year.  Colonoscopy recall entered for September 2019.  If she has a return and rectal bleeding or other new lower GI symptoms she is advised to call.  I spent 15 minutes of face-to-face time with the patient. Greater than 50% of the time was spent counseling and  coordinating care.

## 2017-03-24 NOTE — Patient Instructions (Signed)
We have sent the following medications to your pharmacy for you to pick up at your convenience: pantoprazole.  You will be due for a recall colonoscopy in 11/2017. We will send you a reminder in the mail when it gets closer to that time.  Thank you for choosing me and [facility].  Pricilla Riffle. Dagoberto Ligas., MD., [facility] Regal

## 2017-04-12 ENCOUNTER — Telehealth: Payer: Self-pay | Admitting: Family Medicine

## 2017-04-12 ENCOUNTER — Other Ambulatory Visit: Payer: Self-pay | Admitting: *Deleted

## 2017-04-12 MED ORDER — METFORMIN HCL 500 MG PO TABS: 500.0000 mg | ORAL_TABLET | Freq: Two times a day (BID) | ORAL | 1 refills | Status: DC

## 2017-04-12 NOTE — Telephone Encounter (Signed)
Copied from [place]. Topic: Quick Communication - Rx Refill/Question >> Apr 12, 2017  2:05 PM Robina Ade, Helene Kelp D wrote: Medication: metFORMIN (GLUCOPHAGE) 500 MG tablet   Has the patient contacted their pharmacy? Yes   (Agent: If no, request that the patient contact the pharmacy for the refill.) Patient is completely out of her medication today is her last pill.   Preferred Pharmacy (with phone number or street name): [id number] - [place], [place]: Please be advised that RX refills may take up to 3 business days. We ask that you follow-up with your pharmacy.

## 2017-04-29 ENCOUNTER — Ambulatory Visit
Admission: RE | Admit: 2017-04-29 | Discharge: 2017-04-29 | Disposition: A | Discharge status: Home / Self Care | Payer: BLUE CROSS/BLUE SHIELD | Source: Ambulatory Visit | Attending: Obstetrics & Gynecology | Admitting: Obstetrics & Gynecology

## 2017-04-29 DIAGNOSIS — Z1231 Encounter for screening mammogram for malignant neoplasm of breast: Secondary | ICD-10-CM | POA: Insufficient documentation

## 2017-04-29 DIAGNOSIS — Z01419 Encounter for gynecological examination (general) (routine) without abnormal findings: Secondary | ICD-10-CM

## 2017-05-09 ENCOUNTER — Emergency Department (HOSPITAL_COMMUNITY)
Admission: EM | Admit: 2017-05-09 | Discharge: 2017-05-09 | Disposition: A | Discharge status: Home / Self Care | Payer: BLUE CROSS/BLUE SHIELD | Attending: Emergency Medicine | Admitting: Emergency Medicine

## 2017-05-09 ENCOUNTER — Emergency Department (HOSPITAL_COMMUNITY): Payer: BLUE CROSS/BLUE SHIELD

## 2017-05-09 ENCOUNTER — Other Ambulatory Visit: Payer: Self-pay

## 2017-05-09 ENCOUNTER — Encounter (HOSPITAL_COMMUNITY): Payer: Self-pay

## 2017-05-09 DIAGNOSIS — Z7984 Long term (current) use of oral hypoglycemic drugs: Secondary | ICD-10-CM | POA: Insufficient documentation

## 2017-05-09 DIAGNOSIS — R079 Chest pain, unspecified: Secondary | ICD-10-CM | POA: Diagnosis not present

## 2017-05-09 DIAGNOSIS — Z79899 Other long term (current) drug therapy: Secondary | ICD-10-CM | POA: Insufficient documentation

## 2017-05-09 DIAGNOSIS — E119 Type 2 diabetes mellitus without complications: Secondary | ICD-10-CM | POA: Insufficient documentation

## 2017-05-09 DIAGNOSIS — Z87891 Personal history of nicotine dependence: Secondary | ICD-10-CM | POA: Diagnosis not present

## 2017-05-09 DIAGNOSIS — R072 Precordial pain: Secondary | ICD-10-CM | POA: Diagnosis not present

## 2017-05-09 LAB — BASIC METABOLIC PANEL
ANION GAP: 9 (ref 5–15)
BUN: 15 mg/dL (ref 6–20)
CALCIUM: 8.8 mg/dL — AB (ref 8.9–10.3)
CO2: 24 mmol/L (ref 22–32)
Chloride: 101 mmol/L (ref 101–111)
Creatinine, Ser: 0.97 mg/dL (ref 0.44–1.00)
GFR calc Af Amer: >60 mL/min (ref >60)
GLUCOSE: 174 mg/dL — AB (ref 65–99)
POTASSIUM: 3.9 mmol/L (ref 3.5–5.1)
SODIUM: 134 mmol/L — AB (ref 135–145)

## 2017-05-09 LAB — I-STAT TROPONIN, ED: Troponin i, poc: 0 ng/mL (ref 0.00–0.08)

## 2017-05-09 LAB — CBC
HEMATOCRIT: 35.7 % — AB (ref 36.0–46.0)
Hemoglobin: 12.1 g/dL (ref 12.0–15.0)
MCH: 31.5 pg (ref 26.0–34.0)
MCHC: 33.9 g/dL (ref 30.0–36.0)
MCV: 93 fL (ref 78.0–100.0)
Platelets: 218 10*3/uL (ref 150–400)
RBC: 3.84 MIL/uL — ABNORMAL LOW (ref 3.87–5.11)
RDW: 13 % (ref 11.5–15.5)
WBC: 6.3 10*3/uL (ref 4.0–10.5)

## 2017-05-09 NOTE — ED Provider Notes (Signed)
[facility] EMERGENCY DEPARTMENT Provider Note   CSN: [id number] Arrival date & time: 05/09/17  1752     History   Chief Complaint Chief Complaint  Patient presents with  . Chest Pain    HPI Hannah Rocha is a 52 y.o. female.  Patient with a history of T2DM, ASD s/p repair in 1999, presents with intermittent chest pain for 5 days. She describes the pain as sharp, left sternal radiating to left shoulder blade. No modifying factors. No SOB or pleuritic pain. No recent cough or fever. No nausea or vomiting. No risk factors for PE.   The history is provided by the patient. No language interpreter was used.  Chest Pain   Pertinent negatives include no abdominal pain, no cough, no fever, no headaches, no nausea and no shortness of breath.    Past Medical History:  Diagnosis Date  . Allergy   . Anemia    hx  . Anxiety   . Diabetes mellitus without complication ([place])   . GERD (gastroesophageal reflux disease)   . H/O hiatal hernia   . Obesity   . PONV (postoperative nausea and vomiting)     Patient Active Problem List   Diagnosis Date Noted  . Elevated cholesterol with high triglycerides 03/09/2016  . Diabetes ([place]) 03/09/2016  . Vitamin D deficiency 03/09/2016  . Varicose veins 12/28/2013  . Elevated blood pressure 04/04/2012  . Anxiety 04/04/2012  . GERD (gastroesophageal reflux disease) 01/23/2011  . 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 MENOPAUSAL SYNDROME 01/15/2010  . ANEMIA-IRON DEFICIENCY 06/21/2009  . IBS 06/21/2009  . IRON DEFICIENCY ANEMIA, HX OF 07/10/2008  . HIATAL HERNIA 07/09/2008  . Esophageal reflux 05/03/2007  . ATRIAL SEPTAL DEFECT, SECUNDUM TYPE 05/03/2007  . 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  . CARDIAC MURMUR, HX OF 05/03/2007    Past Surgical History:  Procedure Laterality Date  . ABDOMINAL HYSTERECTOMY    . ASD REPAIR  1999   [facility]  . BREAST CYST ASPIRATION Right 2013    . CLEFT LIP REPAIR    . LAPAROSCOPIC ASSISTED VAGINAL HYSTERECTOMY Bilateral 04/13/2013   Procedure: LAPAROSCOPIC ASSISTED VAGINAL HYSTERECTOMY;  Surgeon: Emily Filbert, MD;  Location: [facility];  Service: Gynecology;  Laterality: Bilateral;  . LAPAROSCOPIC TUBAL LIGATION  03/21/2012   Procedure: LAPAROSCOPIC TUBAL LIGATION;  Surgeon: Emily Filbert, MD;  Location: [facility];  Service: Gynecology;  Laterality: Bilateral;  . MANDIBLE FRACTURE SURGERY    . Plantar Fascitis Repair  01/2015  . TUBAL LIGATION      OB History    Gravida Para Term Preterm AB Living   1 0 0 0 1 0   SAB TAB Ectopic Multiple Live Births   0 1 0 0         Home Medications    Prior to Admission medications   Medication Sig Start Date End Date Taking? Authorizing Provider  acetaminophen (TYLENOL) 500 MG tablet Take 500 mg by mouth every 6 (six) hours as needed.    [provider]  albuterol (PROVENTIL HFA;VENTOLIN HFA) 108 (90 Base) MCG/ACT inhaler Inhale 1-2 puffs into the lungs every 6 (six) hours as needed. 03/19/17   Nche, Charlene Brooke, NP  B Complex Vitamins (VITAMIN B COMPLEX PO) Take 1 tablet by mouth daily.    [provider]  busPIRone (BUSPAR) 30 MG tablet 1qam, 1/2 at lunch, and 1/2qhs 02/16/17   Lucille Passy, MD  Chlorphen-PE-Acetaminophen 4-10-325 MG TABS Take  1 tablet by mouth every 8 (eight) hours. 03/19/17   Nche, Charlene Brooke, NP  flintstones complete (FLINTSTONES) 60 MG chewable tablet Chew 1 tablet by mouth daily.    [provider]  glucose blood (ONETOUCH VERIO) test strip Use as instructed to test blood sugar twice daily e11.9 06/11/16   Lucille Passy, MD  ibuprofen (ADVIL,MOTRIN) 800 MG tablet Take 1 tablet (800 mg total) by mouth every 8 (eight) hours as needed. 03/12/17   Emily Filbert, MD  metFORMIN (GLUCOPHAGE) 500 MG tablet Take 1 tablet (500 mg total) by mouth 2 (two) times daily with a meal. 04/12/17   Lucille Passy, MD  [facility] DELICA LANCETS 71I MISC Use to test  blood sugar once daily E11.9 04/14/16   Lucille Passy, MD  pantoprazole (PROTONIX) 40 MG tablet Take 1 tablet (40 mg total) by mouth daily. 03/24/17   Ladene Artist, MD    Family History Family History  Problem Relation Age of Onset  . Heart disease Mother   . Colon polyps Mother   . Diabetes Mother   . Prostate cancer Father   . Heart disease Father   . Cancer Father        prostate  . Diabetes Maternal Aunt   . Diabetes Paternal Grandmother   . Colon cancer Neg Hx   . Breast cancer Neg Hx     Social History Social History   Tobacco Use  . Smoking status: Former Smoker    Packs/day: 0.00    Years: 0.00    Pack years: 0.00    Types: Cigarettes    Last attempt to quit: 02/21/1987    Years since quitting: 30.2  . Smokeless tobacco: Never Used  . Tobacco comment: 22 years ago-light smoker  Substance Use Topics  . Alcohol use: Yes    Alcohol/week: 0.0 oz    Comment: occasional - hard apple cider, shot  . Drug use: No     Allergies   Sertraline hcl   Review of Systems Review of Systems  Constitutional: Negative for chills and fever.  Respiratory: Negative.  Negative for cough and shortness of breath.   Cardiovascular: Positive for chest pain.  Gastrointestinal: Negative.  Negative for abdominal pain and nausea.  Musculoskeletal: Negative.   Skin: Negative.   Neurological: Negative.  Negative for headaches.     Physical Exam Updated Vital Signs BP 137/74 (BP Location: Left Arm)   Pulse 84   Temp 98.2 F (36.8 C) (Oral)   Resp 15   LMP 03/24/2013   SpO2 100%   Physical Exam  Constitutional: She is oriented to person, place, and time. She appears well-developed and well-nourished.  HENT:  Head: Normocephalic.  Neck: Normal range of motion. Neck supple.  Cardiovascular: Normal rate and regular rhythm.  Pulmonary/Chest: Effort normal and breath sounds normal. She has no wheezes. She has no rhonchi. She has no rales.  No chest wall tenderness.     Abdominal: Soft. Bowel sounds are normal. There is no tenderness. There is no rebound and no guarding.  Musculoskeletal: Normal range of motion.       Right lower leg: She exhibits no edema.  Neurological: She is alert and oriented to person, place, and time.  Skin: Skin is warm and dry. No rash noted.  Psychiatric: She has a normal mood and affect.  Nursing note and vitals reviewed.    ED Treatments / Results  Labs (all labs ordered are listed, but only abnormal results  are displayed) Labs Reviewed  BASIC METABOLIC PANEL - Abnormal; Notable for the following components:      Result Value   Sodium 134 (*)    Glucose, Bld 174 (*)    Calcium 8.8 (*)    All other components within normal limits  CBC - Abnormal; Notable for the following components:   RBC 3.84 (*)    HCT 35.7 (*)    All other components within normal limits  I-STAT TROPONIN, ED    EKG  EKG Interpretation None       Radiology Dg Chest 2 View  Result Date: 05/09/2017 CLINICAL DATA:  Chest pain EXAM: CHEST  2 VIEW COMPARISON:  03/19/2017 chest radiograph. FINDINGS: Intact sternotomy wires. Stable cardiomediastinal silhouette with normal heart size. No pneumothorax. No pleural effusion. Lungs appear clear, with no acute consolidative airspace disease and no pulmonary edema. IMPRESSION: No active cardiopulmonary disease. Electronically Signed   By: Ilona Sorrel M.D.   On: 05/09/2017 18:59    Procedures Procedures (including critical care time)  Medications Ordered in ED Medications - No data to display   Initial Impression / Assessment and Plan / ED Course  I have reviewed the triage vital signs and the nursing notes.  Pertinent labs & imaging results that were available during my care of the patient were reviewed by me and considered in my medical decision making (see chart for details).     Patient with complaint of chest pain, on and off for the past 5 days. No aggravating or alleviating factors. No  fever. No risk factors for PE. Heart Score of 2. No pain currently.  Labs, including troponin x 1, are reassuring. EKG without obvious ischemia. CXR clear of abnormality.   Husband reports he was recently diagnosed with shingles. No rash present. No skin sensitivity.  Symptoms x 5 days. Doubt shingles.   Patient's pain does not appear to be ACS - normal trop, EKG, CXR, atypical pattern. DDx: musculoskeletal vs GI  She can be discharged home with PCP follow up.   Final Clinical Impressions(s) / ED Diagnoses   Final diagnoses:  None   1. Nonspecific chest pain  ED Discharge Orders    None       Charlann Lange, PA-C 05/09/17 2314

## 2017-05-09 NOTE — ED Provider Notes (Signed)
Medical screening examination/treatment/procedure(s) were conducted as a shared visit with non-physician practitioner(s) and myself.  I personally evaluated the patient during the encounter.   EKG Interpretation  Date/Time:  Sunday May 09 2017 17:58:33 EST Ventricular Rate:  82 PR Interval:  152 QRS Duration: 74 QT Interval:  370 QTC Calculation: 432 R Axis:   66 Text Interpretation:  Normal sinus rhythm Low voltage QRS Borderline ECG Confirmed by Lacretia Leigh ([id number]) on 05/09/2017 10:36:50 PM     This is a 52 year old female presents with midsternal chest sharp pain which lasts from seconds to minutes times several days.  No associated dyspnea or diaphoresis.  No anginal type features.  Her EKG is without acute ischemic findings.  Troponin here negative.  Suspect musculoskeletal etiology.  Low risk for pulmonary embolism.  Stable for discharge   Lacretia Leigh, MD 05/09/17 [id number]

## 2017-05-09 NOTE — ED Triage Notes (Signed)
Patient complains of intermittent CP since Tuesday with radiation to shoulder blades. Reports intermittent diaphoresis with same. Describes the pain as a burning

## 2017-05-12 ENCOUNTER — Inpatient Hospital Stay: Payer: BLUE CROSS/BLUE SHIELD | Admitting: Family Medicine

## 2017-05-20 ENCOUNTER — Encounter: Payer: Self-pay | Admitting: Family Medicine

## 2017-05-20 ENCOUNTER — Ambulatory Visit: Payer: BLUE CROSS/BLUE SHIELD | Admitting: Family Medicine

## 2017-05-20 VITALS — BP 122/74 | HR 76 | Temp 98.5°F | Ht 62.0 in | Wt 146.8 lb

## 2017-05-20 DIAGNOSIS — R079 Chest pain, unspecified: Secondary | ICD-10-CM

## 2017-05-20 DIAGNOSIS — F419 Anxiety disorder, unspecified: Secondary | ICD-10-CM | POA: Diagnosis not present

## 2017-05-20 MED ORDER — ESCITALOPRAM OXALATE 5 MG PO TABS: 5.0000 mg | ORAL_TABLET | Freq: Every day | ORAL | 3 refills | Status: DC

## 2017-05-20 NOTE — Patient Instructions (Signed)
We are adding Lexapro 5 mg daily.  Please keep me updated.

## 2017-05-20 NOTE — Progress Notes (Signed)
Subjective:   Patient ID: Hannah Rocha, female    DOB: 08/10/1965, 52 y.o.   MRN: [id number]  Hannah Rocha is a pleasant 52 y.o. year old female who presents to clinic today with Hospitalization Follow-up (Patient is here today for hospital F/U from 2.17.19 for chest pain. She states that she may have intermittent mild chest pain but feels that it may be anxiety related. She is currently fasting.)  on 05/20/2017  HPI:  Martin Majestic to the ER on 05/09/17 for Chest pain.  Note reviewed.  Intermittent sharp chest pain that lasts seconds to minutes for several days prior.  No associated SOB or diaphoresis.  EKG, troponin, CXR reassuring.  She does feel her anxiety is worsening.  Previously well controlled with Buspar 30 mg every morning, 15 mg daily at lunch and 15 mg nightly.  Now having more stress at work. Denies feeling depressed.  When she is anxious, does occasionally get "twinges of chest pain." Current Outpatient Medications on File Prior to Visit  Medication Sig Dispense Refill  . acetaminophen (TYLENOL) 500 MG tablet Take 500 mg by mouth every 6 (six) hours as needed.    Marland Kitchen albuterol (PROVENTIL HFA;VENTOLIN HFA) 108 (90 Base) MCG/ACT inhaler Inhale 1-2 puffs into the lungs every 6 (six) hours as needed. 1 Inhaler 0  . B Complex Vitamins (VITAMIN B COMPLEX PO) Take 1 tablet by mouth daily.    . busPIRone (BUSPAR) 30 MG tablet 1qam, 1/2 at lunch, and 1/2qhs 180 tablet 1  . flintstones complete (FLINTSTONES) 60 MG chewable tablet Chew 1 tablet by mouth daily.    Marland Kitchen glucose blood (ONETOUCH VERIO) test strip Use as instructed to test blood sugar twice daily e11.9 100 each 12  . ibuprofen (ADVIL,MOTRIN) 800 MG tablet Take 1 tablet (800 mg total) by mouth every 8 (eight) hours as needed. 60 tablet 12  . metFORMIN (GLUCOPHAGE) 500 MG tablet Take 1 tablet (500 mg total) by mouth 2 (two) times daily with a meal. 60 tablet 1  . ONETOUCH DELICA LANCETS 62X MISC Use to test blood  sugar once daily E11.9 100 each 1  . pantoprazole (PROTONIX) 40 MG tablet Take 1 tablet (40 mg total) by mouth daily. 30 tablet 11   No current facility-administered medications on file prior to visit.     Allergies  Allergen Reactions  . Sertraline Hcl     REACTION: vomiting, severe anxiety    Past Medical History:  Diagnosis Date  . Allergy   . Anemia    hx  . Anxiety   . Diabetes mellitus without complication ([place])   . GERD (gastroesophageal reflux disease)   . H/O hiatal hernia   . Obesity   . PONV (postoperative nausea and vomiting)     Past Surgical History:  Procedure Laterality Date  . ABDOMINAL HYSTERECTOMY    . ASD REPAIR  1999   [facility]  . BREAST CYST ASPIRATION Right 2013  . CLEFT LIP REPAIR    . LAPAROSCOPIC ASSISTED VAGINAL HYSTERECTOMY Bilateral 04/13/2013   Procedure: LAPAROSCOPIC ASSISTED VAGINAL HYSTERECTOMY;  Surgeon: Emily Filbert, MD;  Location: [facility];  Service: Gynecology;  Laterality: Bilateral;  . LAPAROSCOPIC TUBAL LIGATION  03/21/2012   Procedure: LAPAROSCOPIC TUBAL LIGATION;  Surgeon: Emily Filbert, MD;  Location: [facility];  Service: Gynecology;  Laterality: Bilateral;  . MANDIBLE FRACTURE SURGERY    . Plantar Fascitis Repair  01/2015  . TUBAL LIGATION      Family History  Problem Relation Age  of Onset  . Heart disease Mother   . Colon polyps Mother   . Diabetes Mother   . Prostate cancer Father   . Heart disease Father   . Cancer Father        prostate  . Diabetes Maternal Aunt   . Diabetes Paternal Grandmother   . Colon cancer Neg Hx   . Breast cancer Neg Hx     Social History   Socioeconomic History  . Marital status: Married    Spouse name: Not on file  . Number of children: 0  . Years of education: Not on file  . Highest education level: Not on file  Social Needs  . Financial resource strain: Not on file  . Food insecurity - worry: Not on file  . Food insecurity - inability: Not on file  . Transportation needs - medical: Not  on file  . Transportation needs - non-medical: Not on file  Occupational History  . 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    Employer: [employer], [employer]  Tobacco Use  . Smoking status: Former Smoker    Packs/day: 0.00    Years: 0.00    Pack years: 0.00    Types: Cigarettes    Last attempt to quit: 02/21/1987    Years since quitting: 30.2  . Smokeless tobacco: Never Used  . Tobacco comment: 22 years ago-light smoker  Substance and Sexual Activity  . Alcohol use: Yes    Alcohol/week: 0.0 oz    Comment: occasional - hard apple cider, shot  . Drug use: No  . Sexual activity: Yes    Partners: Male    Birth control/protection: Surgical    Comment: tubaligation/hysterctomy  Other Topics Concern  . Not on file  Social History Narrative   Daily caffeine use   The PMH, PSH, Social History, Family History, Medications, and allergies have been reviewed in [facility], and have been updated if relevant.   Review of Systems  Respiratory: Negative.  Negative for chest tightness.   Cardiovascular: Positive for chest pain. Negative for palpitations and leg swelling.  Gastrointestinal: Negative.   Musculoskeletal: Negative.   Psychiatric/Behavioral: Negative for agitation, behavioral problems, confusion, decreased concentration, dysphoric mood, hallucinations, self-injury, sleep disturbance and suicidal ideas. The patient is nervous/anxious. The patient is not hyperactive.   All other systems reviewed and are negative.      Objective:    BP 122/74 (BP Location: Left Arm, Patient Position: Sitting, Cuff Size: Normal)   Pulse 76   Temp 98.5 F (36.9 C) (Oral)   Ht 5\' 2"  (1.575 m)   Wt 146 lb 12.8 oz (66.6 kg)   LMP 03/24/2013   SpO2 99%   BMI 26.85 kg/m    Physical Exam  Constitutional: She is oriented to person, place, and time. She appears well-developed and well-nourished. No distress.  HENT:  Head: Normocephalic and atraumatic.  Eyes: Conjunctivae are normal.  Neck: Normal range of  motion.  Cardiovascular: Normal rate.  Pulmonary/Chest: Effort normal.  Musculoskeletal: Normal range of motion.  Neurological: She is alert and oriented to person, place, and time. No cranial nerve deficit.  Skin: Skin is warm and dry. She is not diaphoretic.  Psychiatric: She has a normal mood and affect. Her behavior is normal. Judgment and thought content normal.  Nursing note and vitals reviewed.         Assessment & Plan:   Anxiety  Chest pain, unspecified type No Follow-up on file.

## 2017-05-20 NOTE — Assessment & Plan Note (Signed)
Deteriorated. Add lexapro 5 mg daily to current dose of lexapro. She will follow up with me in 1 month. The patient indicates understanding of these issues and agrees with the plan.

## 2017-05-20 NOTE — Assessment & Plan Note (Signed)
ER work up reassuring. Unlikely cardiac. Does seem consistent with panic attacks/anxiety.

## 2017-06-06 ENCOUNTER — Other Ambulatory Visit: Payer: Self-pay | Admitting: Family Medicine

## 2017-06-23 ENCOUNTER — Encounter: Payer: Self-pay | Admitting: Family Medicine

## 2017-06-23 ENCOUNTER — Ambulatory Visit: Payer: BLUE CROSS/BLUE SHIELD | Admitting: Family Medicine

## 2017-06-23 VITALS — BP 126/74 | HR 68 | Temp 98.2°F | Ht 62.0 in | Wt 149.2 lb

## 2017-06-23 DIAGNOSIS — F419 Anxiety disorder, unspecified: Secondary | ICD-10-CM | POA: Diagnosis not present

## 2017-06-23 DIAGNOSIS — E119 Type 2 diabetes mellitus without complications: Secondary | ICD-10-CM

## 2017-06-23 LAB — POCT UA - MICROALBUMIN
Creatinine, POC: 50 mg/dL
MICROALBUMIN (UR) POC: 30 mg/L

## 2017-06-23 LAB — POCT GLYCOSYLATED HEMOGLOBIN (HGB A1C): HEMOGLOBIN A1C: 6

## 2017-06-23 MED ORDER — ESCITALOPRAM OXALATE 10 MG PO TABS: 10.0000 mg | ORAL_TABLET | Freq: Every day | ORAL | 3 refills | Status: DC

## 2017-06-23 MED ORDER — METFORMIN HCL 500 MG PO TABS: 500.0000 mg | ORAL_TABLET | Freq: Every day | ORAL | 3 refills | Status: DC

## 2017-06-23 NOTE — Progress Notes (Signed)
Subjective:   Patient ID: Hannah Rocha, female    DOB: Jul 05, 1965, 52 y.o.   MRN: [id number]  Hannah Rocha is a pleasant 52 y.o. year old female who presents to clinic today with Diabetes (Patient is here today for a 9-month-F/U DM.  Last seen on 12.31.18 and A1C was 6.7.  No changes were made at that time.  She states that she has been working hard.  Her A1C today is 6.0.)  on 06/23/2017  HPI:  DM- still taking Metformin 500 mg twice daily. Working more on diet and exercise.  Brings in FSBS- improved controlled. POC a1c today 6.0.  Denies any episodes of hypoglycemia.  Lab Results  Component Value Date   HGBA1C 6.0 06/23/2017   Feels her anxiety has improved with low dose lexapro and she is tolerating it well.  Asking if she could try to increase the dose.   Current Outpatient Medications on File Prior to Visit  Medication Sig Dispense Refill  . acetaminophen (TYLENOL) 500 MG tablet Take 500 mg by mouth every 6 (six) hours as needed.    Marland Kitchen albuterol (PROVENTIL HFA;VENTOLIN HFA) 108 (90 Base) MCG/ACT inhaler Inhale 1-2 puffs into the lungs every 6 (six) hours as needed. 1 Inhaler 0  . B Complex Vitamins (VITAMIN B COMPLEX PO) Take 1 tablet by mouth daily.    . busPIRone (BUSPAR) 30 MG tablet 1qam, 1/2 at lunch, and 1/2qhs 180 tablet 1  . flintstones complete (FLINTSTONES) 60 MG chewable tablet Chew 1 tablet by mouth daily.    Marland Kitchen glucose blood (ONETOUCH VERIO) test strip Use as instructed to test blood sugar twice daily e11.9 100 each 12  . ibuprofen (ADVIL,MOTRIN) 800 MG tablet Take 1 tablet (800 mg total) by mouth every 8 (eight) hours as needed. 60 tablet 12  . ONETOUCH DELICA LANCETS 59D MISC Use to test blood sugar once daily E11.9 100 each 1  . pantoprazole (PROTONIX) 40 MG tablet Take 1 tablet (40 mg total) by mouth daily. 30 tablet 11   No current facility-administered medications on file prior to visit.     Allergies  Allergen Reactions  . Sertraline  Hcl     REACTION: vomiting, severe anxiety    Past Medical History:  Diagnosis Date  . Allergy   . Anemia    hx  . Anxiety   . Diabetes mellitus without complication ([place])   . GERD (gastroesophageal reflux disease)   . H/O hiatal hernia   . Obesity   . PONV (postoperative nausea and vomiting)     Past Surgical History:  Procedure Laterality Date  . ABDOMINAL HYSTERECTOMY    . ASD REPAIR  1999   [facility]  . BREAST CYST ASPIRATION Right 2013  . CLEFT LIP REPAIR    . LAPAROSCOPIC ASSISTED VAGINAL HYSTERECTOMY Bilateral 04/13/2013   Procedure: LAPAROSCOPIC ASSISTED VAGINAL HYSTERECTOMY;  Surgeon: Emily Filbert, MD;  Location: [facility];  Service: Gynecology;  Laterality: Bilateral;  . LAPAROSCOPIC TUBAL LIGATION  03/21/2012   Procedure: LAPAROSCOPIC TUBAL LIGATION;  Surgeon: Emily Filbert, MD;  Location: [facility];  Service: Gynecology;  Laterality: Bilateral;  . MANDIBLE FRACTURE SURGERY    . Plantar Fascitis Repair  01/2015  . TUBAL LIGATION      Family History  Problem Relation Age of Onset  . Heart disease Mother   . Colon polyps Mother   . Diabetes Mother   . Prostate cancer Father   . Heart disease Father   . Cancer Father  prostate  . Diabetes Maternal Aunt   . Diabetes Paternal Grandmother   . Colon cancer Neg Hx   . Breast cancer Neg Hx     Social History   Socioeconomic History  . Marital status: Married    Spouse name: Not on file  . Number of children: 0  . Years of education: Not on file  . Highest education level: Not on file  Occupational History  . 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    Employer: [employer]  Social Needs  . Financial resource strain: Not on file  . Food insecurity:    Worry: Not on file    Inability: Not on file  . Transportation needs:    Medical: Not on file    Non-medical: Not on file  Tobacco Use  . Smoking status: Former Smoker    Packs/day: 0.00    Years: 0.00    Pack years: 0.00    Types: Cigarettes    Last  attempt to quit: 02/21/1987    Years since quitting: 30.3  . Smokeless tobacco: Never Used  . Tobacco comment: 22 years ago-light smoker  Substance and Sexual Activity  . Alcohol use: Yes    Alcohol/week: 0.0 oz    Comment: occasional - hard apple cider, shot  . Drug use: No  . Sexual activity: Yes    Partners: Male    Birth control/protection: Surgical    Comment: tubaligation/hysterctomy  Lifestyle  . Physical activity:    Days per week: Not on file    Minutes per session: Not on file  . Stress: Not on file  Relationships  . Social connections:    Talks on phone: Not on file    Gets together: Not on file    Attends religious service: Not on file    Active member of club or organization: Not on file    Attends meetings of clubs or organizations: Not on file    Relationship status: Not on file  . Intimate partner violence:    Fear of current or ex partner: Not on file    Emotionally abused: Not on file    Physically abused: Not on file    Forced sexual activity: Not on file  Other Topics Concern  . Not on file  Social History Narrative   Daily caffeine use   The PMH, PSH, Social History, Family History, Medications, and allergies have been reviewed in [facility], and have been updated if relevant.  Review of Systems  Gastrointestinal: Negative.   Endocrine: Negative.   Psychiatric/Behavioral: Negative for agitation, behavioral problems, confusion, decreased concentration, dysphoric mood, hallucinations, self-injury, sleep disturbance and suicidal ideas. The patient is nervous/anxious. The patient is not hyperactive.   All other systems reviewed and are negative.      Objective:    BP 126/74 (BP Location: Left Arm, Patient Position: Sitting, Cuff Size: Normal)   Pulse 68   Temp 98.2 F (36.8 C) (Oral)   Ht 5\' 2"  (1.575 m)   Wt 149 lb 3.2 oz (67.7 kg)   LMP 03/24/2013   SpO2 99%   BMI 27.29 kg/m   Wt Readings from Last 3 Encounters:  06/23/17 149 lb 3.2 oz (67.7 kg)    05/20/17 146 lb 12.8 oz (66.6 kg)  03/24/17 147 lb 9.6 oz (67 kg)    Physical Exam  Constitutional: She appears well-developed and well-nourished. No distress.  HENT:  Head: Normocephalic and atraumatic.  Eyes: Conjunctivae are normal.  Neck: Normal range of motion.  Cardiovascular: Normal rate and regular rhythm.  Pulmonary/Chest: Effort normal and breath sounds normal.  Musculoskeletal: Normal range of motion. She exhibits no edema.  Skin: Skin is warm and dry. She is not diaphoretic.  Psychiatric: She has a normal mood and affect. Her behavior is normal. Judgment and thought content normal.  Nursing note and vitals reviewed.         Assessment & Plan:   Type 2 diabetes mellitus without complication, without long-term current use of insulin (HCC) - Plan: POCT HgB A1C, POCT UA - Microalbumin  Anxiety No follow-ups on file.

## 2017-06-23 NOTE — Assessment & Plan Note (Signed)
Improved control. Congratulated her on success with diet and exercise. Decrease Metformin to 500 mg daily with breakfast.

## 2017-06-23 NOTE — Assessment & Plan Note (Signed)
Improved with low dose lexapro but still having symptoms. Increase lexapro to 10 mg daily.  Tolerating it well. Call or return to clinic prn if these symptoms worsen or fail to improve as anticipated. The patient indicates understanding of these issues and agrees with the plan.

## 2017-06-23 NOTE — Patient Instructions (Signed)
Great to see you. We are decreasing your Metformin to 500 mg daily with breakfast. Come see me in 3 months.  SO PROUD OF YOU!!!!

## 2017-08-13 ENCOUNTER — Other Ambulatory Visit: Payer: Self-pay | Admitting: Family Medicine

## 2017-08-17 ENCOUNTER — Telehealth: Payer: Self-pay

## 2017-08-17 NOTE — Telephone Encounter (Signed)
LMOVM asking pt to RTC/was last seen on 4.3.19 and A1C was checked, Metformin was decreased, advised to RTN in 3 months/she is on the schedule for 5.29.19 which is not even 2 months out and insurance will not pay for an A1C more than every 3 months/I asked that she call back to advise if she wants to reschedule this for after 7.4.19 or if she wants to still be seen for something else as it is a possibility that she will have to pay for the A1C out of pocket/PEC ok to reschedule appt if pt wants to or can be seen on 5.29.19/thx dmf

## 2017-08-18 ENCOUNTER — Ambulatory Visit: Payer: BLUE CROSS/BLUE SHIELD | Admitting: Family Medicine

## 2017-08-26 ENCOUNTER — Other Ambulatory Visit: Payer: Self-pay | Admitting: Family Medicine

## 2017-09-22 ENCOUNTER — Ambulatory Visit: Payer: BLUE CROSS/BLUE SHIELD | Admitting: Family Medicine

## 2017-09-22 ENCOUNTER — Encounter: Payer: Self-pay | Admitting: Family Medicine

## 2017-09-22 VITALS — BP 134/84 | HR 66 | Temp 98.5°F | Ht 62.0 in | Wt 152.0 lb

## 2017-09-22 DIAGNOSIS — E119 Type 2 diabetes mellitus without complications: Secondary | ICD-10-CM | POA: Diagnosis not present

## 2017-09-22 DIAGNOSIS — Z23 Encounter for immunization: Secondary | ICD-10-CM | POA: Diagnosis not present

## 2017-09-22 LAB — COMPREHENSIVE METABOLIC PANEL
ALK PHOS: 59 U/L (ref 39–117)
ALT: 15 U/L (ref 0–35)
AST: 15 U/L (ref 0–37)
Albumin: 4.1 g/dL (ref 3.5–5.2)
BILIRUBIN TOTAL: 0.4 mg/dL (ref 0.2–1.2)
BUN: 20 mg/dL (ref 6–23)
CALCIUM: 9.2 mg/dL (ref 8.4–10.5)
CO2: 28 mEq/L (ref 19–32)
CREATININE: 0.72 mg/dL (ref 0.40–1.20)
Chloride: 105 mEq/L (ref 96–112)
GFR: 89.9 mL/min (ref >60.00)
Glucose, Bld: 126 mg/dL — ABNORMAL HIGH (ref 70–99)
Potassium: 4.9 mEq/L (ref 3.5–5.1)
Sodium: 140 mEq/L (ref 135–145)
TOTAL PROTEIN: 6.8 g/dL (ref 6.0–8.3)

## 2017-09-22 LAB — LIPID PANEL
Cholesterol: 195 mg/dL (ref 0–200)
HDL: 43.5 mg/dL (ref >39.00)
LDL CALC: 130 mg/dL — AB (ref 0–99)
NonHDL: 151.21
Total CHOL/HDL Ratio: 4
Triglycerides: 106 mg/dL (ref 0.0–149.0)
VLDL: 21.2 mg/dL (ref 0.0–40.0)

## 2017-09-22 LAB — HEMOGLOBIN A1C: Hgb A1c MFr Bld: 6.5 % (ref 4.6–6.5)

## 2017-09-22 MED ORDER — METFORMIN HCL 500 MG PO TABS: 500.0000 mg | ORAL_TABLET | Freq: Every day | ORAL | 3 refills | Status: DC

## 2017-09-22 NOTE — Assessment & Plan Note (Signed)
Seems to be under good control based on her home readings. Verify with labs today. Foot exam, PNV given today. Orders Placed This Encounter  Procedures  . Pneumococcal polysaccharide vaccine 23-valent greater than or equal to 53yo subcutaneous/IM  . Tdap vaccine greater than or equal to 7yo IM  . Hemoglobin A1c  . Lipid panel  . Comprehensive metabolic panel

## 2017-09-22 NOTE — Progress Notes (Signed)
0

## 2017-09-22 NOTE — Patient Instructions (Signed)
Great to see you. I will call you with your lab results from today and you can view them online.   

## 2017-09-22 NOTE — Progress Notes (Signed)
Subjective:   Patient ID: Hannah Rocha, female    DOB: 03-20-1966, 52 y.o.   MRN: [id number]  Hannah Rocha is a pleasant 52 y.o. year old female who presents to clinic today with Follow-up (Patient is here today to F/U with DM.  On 4.3.19 her A1C was 6.0.  She has gained 1 lb since last visit.  She brought in her BS readings for the last 3 months.  She agrees to get PNV-23 & Tdap today.)  on 09/22/2017  HPI:   Patient is here today to F/U with DM. On 4.3.19 her A1C was 6.0 and we therefore decreased her Metformin to 500 mg daily with breakfast. She has gained 1 lb since last visit. She brought in her BS readings for the last 3 months.   Fasting FSBS ranging 101-136.  Denies any episodes of hypoglycemia.  Lab Results  Component Value Date   HGBA1C 6.0 06/23/2017    Lab Results  Component Value Date   CHOL 173 06/11/2016   HDL 32.80 (L) 06/11/2016   LDLCALC 123 (H) 06/11/2016   LDLDIRECT 169.0 03/09/2016   TRIG 86.0 06/11/2016   CHOLHDL 5 06/11/2016   Lab Results  Component Value Date   ALT 25 12/17/2016   AST 16 12/17/2016   ALKPHOS 50 12/17/2016   BILITOT 0.5 12/17/2016     Current Outpatient Medications on File Prior to Visit  Medication Sig Dispense Refill  . acetaminophen (TYLENOL) 500 MG tablet Take 500 mg by mouth every 6 (six) hours as needed.    . B Complex Vitamins (VITAMIN B COMPLEX PO) Take 1 tablet by mouth daily.    . busPIRone (BUSPAR) 30 MG tablet TAKE 1 TABLET BY MOUTH EVERY MORNING 1/2 TAB AT LUNCH, AND 1/2 AT BEDTIME 180 tablet 1  . escitalopram (LEXAPRO) 10 MG tablet Take 1 tablet (10 mg total) by mouth daily. 30 tablet 3  . flintstones complete (FLINTSTONES) 60 MG chewable tablet Chew 1 tablet by mouth daily.    Marland Kitchen glucose blood (ONETOUCH VERIO) test strip Use as instructed to test blood sugar twice daily e11.9 100 each 12  . ibuprofen (ADVIL,MOTRIN) 800 MG tablet Take 1 tablet (800 mg total) by mouth every 8 (eight) hours as needed.  60 tablet 12  . ONETOUCH DELICA LANCETS 44Q MISC Use to test blood sugar once daily E11.9 100 each 1  . pantoprazole (PROTONIX) 40 MG tablet Take 1 tablet (40 mg total) by mouth daily. 30 tablet 11   No current facility-administered medications on file prior to visit.     Allergies  Allergen Reactions  . Sertraline Hcl     REACTION: vomiting, severe anxiety    Past Medical History:  Diagnosis Date  . Allergy   . Anemia    hx  . Anxiety   . Diabetes mellitus without complication ([place])   . GERD (gastroesophageal reflux disease)   . H/O hiatal hernia   . Obesity   . PONV (postoperative nausea and vomiting)     Past Surgical History:  Procedure Laterality Date  . ABDOMINAL HYSTERECTOMY    . ASD REPAIR  1999   [facility]  . BREAST CYST ASPIRATION Right 2013  . CLEFT LIP REPAIR    . LAPAROSCOPIC ASSISTED VAGINAL HYSTERECTOMY Bilateral 04/13/2013   Procedure: LAPAROSCOPIC ASSISTED VAGINAL HYSTERECTOMY;  Surgeon: Emily Filbert, MD;  Location: [facility];  Service: Gynecology;  Laterality: Bilateral;  . LAPAROSCOPIC TUBAL LIGATION  03/21/2012   Procedure: LAPAROSCOPIC TUBAL LIGATION;  Surgeon: Emily Filbert, MD;  Location: [facility];  Service: Gynecology;  Laterality: Bilateral;  . MANDIBLE FRACTURE SURGERY    . Plantar Fascitis Repair  01/2015  . TUBAL LIGATION      Family History  Problem Relation Age of Onset  . Heart disease Mother   . Colon polyps Mother   . Diabetes Mother   . Prostate cancer Father   . Heart disease Father   . Cancer Father        prostate  . Diabetes Maternal Aunt   . Diabetes Paternal Grandmother   . Colon cancer Neg Hx   . Breast cancer Neg Hx     Social History   Socioeconomic History  . Marital status: Married    Spouse name: Not on file  . Number of children: 0  . Years of education: Not on file  . Highest education level: Not on file  Occupational History  . 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    Employer: [employer]  Social Needs  .  Financial resource strain: Not on file  . Food insecurity:    Worry: Not on file    Inability: Not on file  . Transportation needs:    Medical: Not on file    Non-medical: Not on file  Tobacco Use  . Smoking status: Former Smoker    Packs/day: 0.00    Years: 0.00    Pack years: 0.00    Types: Cigarettes    Last attempt to quit: 02/21/1987    Years since quitting: 30.6  . Smokeless tobacco: Never Used  . Tobacco comment: 22 years ago-light smoker  Substance and Sexual Activity  . Alcohol use: Yes    Alcohol/week: 0.0 oz    Comment: occasional - hard apple cider, shot  . Drug use: No  . Sexual activity: Yes    Partners: Male    Birth control/protection: Surgical    Comment: tubaligation/hysterctomy  Lifestyle  . Physical activity:    Days per week: Not on file    Minutes per session: Not on file  . Stress: Not on file  Relationships  . Social connections:    Talks on phone: Not on file    Gets together: Not on file    Attends religious service: Not on file    Active member of club or organization: Not on file    Attends meetings of clubs or organizations: Not on file    Relationship status: Not on file  . Intimate partner violence:    Fear of current or ex partner: Not on file    Emotionally abused: Not on file    Physically abused: Not on file    Forced sexual activity: Not on file  Other Topics Concern  . Not on file  Social History Narrative   Daily caffeine use   The PMH, PSH, Social History, Family History, Medications, and allergies have been reviewed in [facility], and have been updated if relevant.   Review of Systems  Constitutional: Negative.   HENT: Negative.   Eyes: Negative.   Respiratory: Negative.   Cardiovascular: Negative.   Gastrointestinal: Negative.   Endocrine: Negative.   Genitourinary: Negative.   Musculoskeletal: Negative.   Allergic/Immunologic: Negative.   Neurological: Negative.   Hematological: Negative.   Psychiatric/Behavioral:  Negative.   All other systems reviewed and are negative.      Objective:    BP 134/84 (BP Location: Left Arm, Patient Position: Sitting, Cuff Size: Normal)   Pulse 66  Temp 98.5 F (36.9 C) (Oral)   Ht 5\' 2"  (1.575 m)   Wt 152 lb (68.9 kg)   LMP 03/24/2013   SpO2 99%   BMI 27.80 kg/m   Wt Readings from Last 3 Encounters:  09/22/17 152 lb (68.9 kg)  06/23/17 149 lb 3.2 oz (67.7 kg)  05/20/17 146 lb 12.8 oz (66.6 kg)    Physical Exam   General:  Well-developed,well-nourished,in no acute distress; alert,appropriate and cooperative throughout examination Head:  normocephalic and atraumatic.   Eyes:  vision grossly intact, PERRL Ears:  R ear normal and L ear normal externally, TMs clear bilaterally Nose:  no external deformity.   Mouth:  good dentition.   Lungs:  Normal respiratory effort, chest expands symmetrically. Lungs are clear to auscultation, no crackles or wheezes. Heart:  Normal rate and regular rhythm. S1 and S2 normal without gallop, murmur, click, rub or other extra sounds. Abdomen:  Bowel sounds positive,abdomen soft and non-tender without masses, organomegaly or hernias noted. Msk:  No deformity or scoliosis noted of thoracic or lumbar spine.   Extremities:  No clubbing, cyanosis, edema, or deformity noted with normal full range of motion of all joints.   Neurologic:  alert & oriented X3 and gait normal.   Skin:  Intact without suspicious lesions or rashes Psych:  Cognition and judgment appear intact. Alert and cooperative with normal attention span and concentration. No apparent delusions, illusions, hallucinations   Assessment & Plan:   Type 2 diabetes mellitus without complication, without long-term current use of insulin (HCC) - Plan: Hemoglobin A1c, Lipid panel, Comprehensive metabolic panel  Need for pneumococcal vaccination - Plan: Pneumococcal polysaccharide vaccine 23-valent greater than or equal to 2yo subcutaneous/IM  Need for Tdap vaccination -  Plan: Tdap vaccine greater than or equal to 7yo IM No follow-ups on file.

## 2017-09-29 ENCOUNTER — Ambulatory Visit: Payer: BLUE CROSS/BLUE SHIELD | Admitting: Family Medicine

## 2017-10-20 ENCOUNTER — Other Ambulatory Visit: Payer: Self-pay

## 2017-10-20 ENCOUNTER — Encounter (HOSPITAL_COMMUNITY): Payer: Self-pay

## 2017-10-20 ENCOUNTER — Emergency Department (HOSPITAL_COMMUNITY): Payer: BLUE CROSS/BLUE SHIELD

## 2017-10-20 ENCOUNTER — Emergency Department (HOSPITAL_COMMUNITY)
Admission: EM | Admit: 2017-10-20 | Discharge: 2017-10-21 | Disposition: A | Discharge status: Home / Self Care | Payer: BLUE CROSS/BLUE SHIELD | Attending: Emergency Medicine | Admitting: Emergency Medicine

## 2017-10-20 DIAGNOSIS — Z87891 Personal history of nicotine dependence: Secondary | ICD-10-CM | POA: Diagnosis not present

## 2017-10-20 DIAGNOSIS — S199XXA Unspecified injury of neck, initial encounter: Secondary | ICD-10-CM | POA: Diagnosis not present

## 2017-10-20 DIAGNOSIS — S0181XA Laceration without foreign body of other part of head, initial encounter: Secondary | ICD-10-CM | POA: Diagnosis not present

## 2017-10-20 DIAGNOSIS — E119 Type 2 diabetes mellitus without complications: Secondary | ICD-10-CM | POA: Diagnosis not present

## 2017-10-20 DIAGNOSIS — S060X0A Concussion without loss of consciousness, initial encounter: Secondary | ICD-10-CM | POA: Diagnosis not present

## 2017-10-20 DIAGNOSIS — S0993XA Unspecified injury of face, initial encounter: Secondary | ICD-10-CM | POA: Diagnosis not present

## 2017-10-20 DIAGNOSIS — Y92017 Garden or yard in single-family (private) house as the place of occurrence of the external cause: Secondary | ICD-10-CM | POA: Insufficient documentation

## 2017-10-20 DIAGNOSIS — Y9301 Activity, walking, marching and hiking: Secondary | ICD-10-CM | POA: Insufficient documentation

## 2017-10-20 DIAGNOSIS — W0110XA Fall on same level from slipping, tripping and stumbling with subsequent striking against unspecified object, initial encounter: Secondary | ICD-10-CM | POA: Insufficient documentation

## 2017-10-20 DIAGNOSIS — S0101XA Laceration without foreign body of scalp, initial encounter: Secondary | ICD-10-CM | POA: Diagnosis not present

## 2017-10-20 DIAGNOSIS — Z7984 Long term (current) use of oral hypoglycemic drugs: Secondary | ICD-10-CM | POA: Diagnosis not present

## 2017-10-20 DIAGNOSIS — W19XXXA Unspecified fall, initial encounter: Secondary | ICD-10-CM

## 2017-10-20 DIAGNOSIS — Z79899 Other long term (current) drug therapy: Secondary | ICD-10-CM | POA: Diagnosis not present

## 2017-10-20 DIAGNOSIS — Y999 Unspecified external cause status: Secondary | ICD-10-CM | POA: Insufficient documentation

## 2017-10-20 DIAGNOSIS — R030 Elevated blood-pressure reading, without diagnosis of hypertension: Secondary | ICD-10-CM | POA: Diagnosis not present

## 2017-10-20 LAB — URINALYSIS, ROUTINE W REFLEX MICROSCOPIC
Bilirubin Urine: NEGATIVE
Glucose, UA: NEGATIVE mg/dL
HGB URINE DIPSTICK: NEGATIVE
Ketones, ur: 5 mg/dL — AB
LEUKOCYTES UA: NEGATIVE
NITRITE: NEGATIVE
PROTEIN: NEGATIVE mg/dL
Specific Gravity, Urine: 1.01 (ref 1.005–1.030)
pH: 6 (ref 5.0–8.0)

## 2017-10-20 LAB — CBC WITH DIFFERENTIAL/PLATELET
Abs Immature Granulocytes: 0 10*3/uL (ref 0.0–0.1)
BASOS PCT: 0 %
Basophils Absolute: 0 10*3/uL (ref 0.0–0.1)
EOS ABS: 0.1 10*3/uL (ref 0.0–0.7)
Eosinophils Relative: 1 %
HCT: 38 % (ref 36.0–46.0)
Hemoglobin: 12.6 g/dL (ref 12.0–15.0)
IMMATURE GRANULOCYTES: 0 %
LYMPHS ABS: 1.6 10*3/uL (ref 0.7–4.0)
Lymphocytes Relative: 21 %
MCH: 30.8 pg (ref 26.0–34.0)
MCHC: 33.2 g/dL (ref 30.0–36.0)
MCV: 92.9 fL (ref 78.0–100.0)
Monocytes Absolute: 0.6 10*3/uL (ref 0.1–1.0)
Monocytes Relative: 8 %
NEUTROS PCT: 70 %
Neutro Abs: 5.3 10*3/uL (ref 1.7–7.7)
PLATELETS: 193 10*3/uL (ref 150–400)
RBC: 4.09 MIL/uL (ref 3.87–5.11)
RDW: 11.9 % (ref 11.5–15.5)
WBC: 7.6 10*3/uL (ref 4.0–10.5)

## 2017-10-20 LAB — BASIC METABOLIC PANEL
ANION GAP: 14 (ref 5–15)
BUN: 9 mg/dL (ref 6–20)
CO2: 22 mmol/L (ref 22–32)
Calcium: 9.1 mg/dL (ref 8.9–10.3)
Chloride: 96 mmol/L — ABNORMAL LOW (ref 98–111)
Creatinine, Ser: 0.69 mg/dL (ref 0.44–1.00)
GFR calc Af Amer: >60 mL/min (ref >60)
Glucose, Bld: 221 mg/dL — ABNORMAL HIGH (ref 70–99)
Potassium: 3.4 mmol/L — ABNORMAL LOW (ref 3.5–5.1)
Sodium: 132 mmol/L — ABNORMAL LOW (ref 135–145)

## 2017-10-20 LAB — I-STAT TROPONIN, ED
TROPONIN I, POC: 0 ng/mL (ref 0.00–0.08)
TROPONIN I, POC: 0 ng/mL (ref 0.00–0.08)

## 2017-10-20 LAB — CBG MONITORING, ED: Glucose-Capillary: 99 mg/dL (ref 70–99)

## 2017-10-20 MED ORDER — LIDOCAINE HCL (PF) 1 % IJ SOLN
10.0000 mL | Freq: Once | INTRAMUSCULAR | Status: AC
  Administered 2017-10-20: 10 mL via INTRADERMAL
  Filled 2017-10-20: qty 10

## 2017-10-20 MED ORDER — LIDOCAINE-EPINEPHRINE-TETRACAINE (LET) SOLUTION
3.0000 mL | Freq: Once | NASAL | Status: AC
  Administered 2017-10-20: 3 mL via TOPICAL
  Filled 2017-10-20: qty 3

## 2017-10-20 MED ORDER — LORAZEPAM 2 MG/ML IJ SOLN
0.5000 mg | Freq: Once | INTRAMUSCULAR | Status: AC
  Administered 2017-10-20: 0.5 mg via INTRAVENOUS
  Filled 2017-10-20: qty 1

## 2017-10-20 MED ORDER — MORPHINE SULFATE (PF) 4 MG/ML IV SOLN
2.0000 mg | Freq: Once | INTRAVENOUS | Status: AC
  Administered 2017-10-20: 2 mg via INTRAVENOUS
  Filled 2017-10-20: qty 1

## 2017-10-20 MED ORDER — FENTANYL CITRATE (PF) 100 MCG/2ML IJ SOLN
25.0000 ug | Freq: Once | INTRAMUSCULAR | Status: AC
  Administered 2017-10-20: 25 ug via INTRAVENOUS
  Filled 2017-10-20: qty 2

## 2017-10-20 MED ORDER — ONDANSETRON HCL 4 MG/2ML IJ SOLN
4.0000 mg | Freq: Once | INTRAMUSCULAR | Status: AC
  Administered 2017-10-20: 4 mg via INTRAVENOUS
  Filled 2017-10-20: qty 2

## 2017-10-20 NOTE — ED Triage Notes (Signed)
Pt states that she was putting up a lawn chair today and felt like her sugar was low. Pt fell, hitting her head on the concrete. Pt has significant laceration to forehead. Pt reports she fell around 6 pm today. Driven by husband to ED. A&O, bleeding controlled at this time.

## 2017-10-20 NOTE — ED Notes (Signed)
Pt states she believes her blood sugar dropped and she fell striking the left side of her forehead. Pt has a large deep laceration to that area as well.

## 2017-10-20 NOTE — ED Provider Notes (Signed)
[name] Provider Note   CSN: [id number] Arrival date & time: 10/20/17  1847     History   Chief Complaint Chief Complaint  Patient presents with  . Fall    HPI Hannah Rocha is a 52 y.o. female.  HPI Patient is a 52 year old female with history of anxiety and diabetes who presents to the emergency department today for evaluation after fall.  States that she was at home walking outside whenever she tripped while putting away a lawn chair.  [address] and struck her face on the concrete sustaining a laceration above her left eyebrow.  States that she felt like her blood sugar was low just prior to the fall.  Uncertain if she lost consciousness.  Denies any numbness or weakness.  Does complain of mild headache.  No neck pain or back pain.  Last tetanus was updated in early July of this year.  No vision changes.  No vomiting.  Is accompanied by her husband who reports that she has had progressive word finding difficulties which is new for her today.  Denies any alcohol or illicit drug use today.   Past Medical History:  Diagnosis Date  . Allergy   . Anemia    hx  . Anxiety   . Diabetes mellitus without complication ([place])   . GERD (gastroesophageal reflux disease)   . H/O hiatal hernia   . Obesity   . PONV (postoperative nausea and vomiting)     Patient Active Problem List   Diagnosis Date Noted  . Elevated cholesterol with high triglycerides 03/09/2016  . Diabetes ([place]) 03/09/2016  . Vitamin D deficiency 03/09/2016  . Varicose veins 12/28/2013  . Elevated blood pressure 04/04/2012  . Anxiety 04/04/2012  . GERD (gastroesophageal reflux disease) 01/23/2011  . 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 MENOPAUSAL SYNDROME 01/15/2010  . ANEMIA-IRON DEFICIENCY 06/21/2009  . IBS 06/21/2009  . IRON DEFICIENCY ANEMIA, HX OF 07/10/2008  . HIATAL HERNIA 07/09/2008  . Esophageal reflux 05/03/2007  . ATRIAL SEPTAL DEFECT,  SECUNDUM TYPE 05/03/2007  . 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  . CARDIAC MURMUR, HX OF 05/03/2007    Past Surgical History:  Procedure Laterality Date  . ABDOMINAL HYSTERECTOMY    . ASD REPAIR  1999   [facility]  . BREAST CYST ASPIRATION Right 2013  . CLEFT LIP REPAIR    . LAPAROSCOPIC ASSISTED VAGINAL HYSTERECTOMY Bilateral 04/13/2013   Procedure: LAPAROSCOPIC ASSISTED VAGINAL HYSTERECTOMY;  Surgeon: Emily Filbert, MD;  Location: [facility];  Service: Gynecology;  Laterality: Bilateral;  . LAPAROSCOPIC TUBAL LIGATION  03/21/2012   Procedure: LAPAROSCOPIC TUBAL LIGATION;  Surgeon: Emily Filbert, MD;  Location: [facility];  Service: Gynecology;  Laterality: Bilateral;  . MANDIBLE FRACTURE SURGERY    . Plantar Fascitis Repair  01/2015  . TUBAL LIGATION       OB History    Gravida  1   Para  0   Term  0   Preterm  0   AB  1   Living  0     SAB  0   TAB  1   Ectopic  0   Multiple  0   Live Births               Home Medications    Prior to Admission medications   Medication Sig Start Date End Date Taking? Authorizing Provider  acetaminophen (TYLENOL) 500 MG tablet Take 500 mg by mouth every  6 (six) hours as needed for mild pain.    Yes [provider]  B Complex Vitamins (VITAMIN B COMPLEX PO) Take 1 tablet by mouth daily.   Yes [provider]  busPIRone (BUSPAR) 30 MG tablet TAKE 1 TABLET BY MOUTH EVERY MORNING 1/2 TAB AT LUNCH, AND 1/2 AT BEDTIME 08/13/17  Yes Lucille Passy, MD  escitalopram (LEXAPRO) 10 MG tablet Take 1 tablet (10 mg total) by mouth daily. 06/23/17  Yes Lucille Passy, MD  flintstones complete (FLINTSTONES) 60 MG chewable tablet Chew 1 tablet by mouth daily.   Yes [provider]  glucose blood (ONETOUCH VERIO) test strip Use as instructed to test blood sugar twice daily e11.9 06/11/16  Yes Lucille Passy, MD  ibuprofen (ADVIL,MOTRIN) 800 MG tablet Take 1 tablet (800 mg total) by mouth every 8 (eight) hours as  needed. Patient taking differently: Take 800 mg by mouth every 8 (eight) hours as needed for moderate pain.  03/12/17  Yes Dove, Myra C, MD  metFORMIN (GLUCOPHAGE) 500 MG tablet Take 1 tablet (500 mg total) by mouth daily with breakfast. 09/22/17  Yes Lucille Passy, MD  [facility] DELICA LANCETS 67E MISC Use to test blood sugar once daily E11.9 04/14/16  Yes Lucille Passy, MD  pantoprazole (PROTONIX) 40 MG tablet Take 1 tablet (40 mg total) by mouth daily. 03/24/17  Yes Ladene Artist, MD    Family History Family History  Problem Relation Age of Onset  . Heart disease Mother   . Colon polyps Mother   . Diabetes Mother   . Prostate cancer Father   . Heart disease Father   . Cancer Father        prostate  . Diabetes Maternal Aunt   . Diabetes Paternal Grandmother   . Colon cancer Neg Hx   . Breast cancer Neg Hx     Social History Social History   Tobacco Use  . Smoking status: Former Smoker    Packs/day: 0.00    Years: 0.00    Pack years: 0.00    Types: Cigarettes    Last attempt to quit: 02/21/1987    Years since quitting: 30.6  . Smokeless tobacco: Never Used  . Tobacco comment: 22 years ago-light smoker  Substance Use Topics  . Alcohol use: Yes    Alcohol/week: 0.0 oz    Comment: occasional - hard apple cider, shot  . Drug use: No     Allergies   Sertraline hcl   Review of Systems Review of Systems  Constitutional: Negative for chills and fever.  HENT: Negative for congestion and sore throat.   Eyes: Negative for visual disturbance.  Respiratory: Negative for cough and shortness of breath.   Cardiovascular: Negative for chest pain and leg swelling.  Gastrointestinal: Negative for abdominal pain, diarrhea, nausea and vomiting.  Genitourinary: Negative for dysuria and hematuria.  Musculoskeletal: Negative for back pain and neck pain.  Skin: Positive for wound (above left eyebrow). Negative for color change and rash.  Neurological: Positive for headaches. Negative  for weakness.  All other systems reviewed and are negative.    Physical Exam Updated Vital Signs BP (!) 152/73   Pulse 69   Temp 98 F (36.7 C) (Oral)   Resp 14   Ht 5\' 2"  (1.575 m)   Wt 66.2 kg (146 lb)   LMP 03/24/2013   SpO2 100%   BMI 26.70 kg/m   Physical Exam  Constitutional: She appears well-developed and well-nourished. No  distress.  HENT:  Head: Normocephalic.    Approximately 6 cm laceration above the left eyebrow with bone exposure.  2cm galea tear.  Unable to lift eyebrow.  Oozing only small amount of blood.  Eyes: Pupils are equal, round, and reactive to light. Conjunctivae are normal.  EOMI. Pupils 75mm reactive bilaterally.   Neck: Neck supple.  Cardiovascular: Regular rhythm and intact distal pulses.  Pulmonary/Chest: Effort normal and breath sounds normal. No respiratory distress.  Abdominal: Soft. She exhibits no distension. There is no tenderness.  Musculoskeletal: She exhibits no edema.  Extremities palpated and are atraumatic.  No bruising. No midline CTL spine tenderness to palpation  Neurological: She is alert.  Patient alert and oriented x3.  Delayed speech.  Word finding difficulties. States first letter of some of her meds but is able to give full med history and med list.  5/5 grip, bicep flexion, and tricep extension bilateral upper extremity.  Able to hold each leg off of bed.  No facial asymmetry except for area affected by laceration.   Skin: Skin is warm and dry.  Psychiatric: She has a normal mood and affect.  Nursing note and vitals reviewed.    ED Treatments / Results  Labs (all labs ordered are listed, but only abnormal results are displayed) Labs Reviewed  BASIC METABOLIC PANEL - Abnormal; Notable for the following components:      Result Value   Sodium 132 (*)    Potassium 3.4 (*)    Chloride 96 (*)    Glucose, Bld 221 (*)    All other components within normal limits  URINALYSIS, ROUTINE W REFLEX MICROSCOPIC - Abnormal; Notable  for the following components:   Ketones, ur 5 (*)    All other components within normal limits  CBC WITH DIFFERENTIAL/PLATELET  CBG MONITORING, ED  I-STAT TROPONIN, ED  I-STAT TROPONIN, ED    EKG EKG Interpretation  Date/Time:  Wednesday October 20 2017 19:23:27 EDT Ventricular Rate:  66 PR Interval:    QRS Duration: 89 QT Interval:  425 QTC Calculation: 446 R Axis:   27 Text Interpretation:  Sinus rhythm Low voltage, precordial leads No significant change since last tracing Confirmed by Blanchie Dessert ([id number]) on 10/20/2017 9:17:38 PM   Radiology Ct Head Wo Contrast  Result Date: 10/20/2017 CLINICAL DATA:  [address] face forward onto concrete. Forehead laceration. Loss of consciousness. EXAM: CT HEAD WITHOUT CONTRAST CT MAXILLOFACIAL WITHOUT CONTRAST CT CERVICAL SPINE WITHOUT CONTRAST TECHNIQUE: Multidetector CT imaging of the head, cervical spine, and maxillofacial structures were performed using the standard protocol without intravenous contrast. Multiplanar CT image reconstructions of the cervical spine and maxillofacial structures were also generated. COMPARISON:  Orbit CT 10/23/2008 FINDINGS: CT HEAD FINDINGS Brain: There is no evidence for acute hemorrhage, hydrocephalus, mass lesion, or abnormal extra-axial fluid collection. No definite CT evidence for acute infarction. Vascular: No hyperdense vessel or unexpected calcification. Skull: No evidence for fracture. No worrisome lytic or sclerotic lesion. Other: Large soft tissue laceration noted left frontal scalp. CT MAXILLOFACIAL FINDINGS Osseous: No fracture or mandibular dislocation. No destructive process. Orbits: Negative. No traumatic or inflammatory finding. Sinuses: Postsurgical change noted left maxillary sinuses, both of which are completely opacified. Remaining visualized paranasal sinuses are clear. Posterior mastoid fluid identified bilaterally, left greater than right. Soft tissues: Unremarkable. CT CERVICAL SPINE FINDINGS  Alignment: Straightening of normal cervical lordosis. Skull base and vertebrae: No acute fracture. No primary bone lesion or focal pathologic process. Soft tissues and spinal canal: No prevertebral fluid or  swelling. No visible canal hematoma. Disc levels:  Preserved. Upper chest: Symmetric biapical scarring. Other: None. IMPRESSION: 1. Large soft tissue defect left frontal scalp. 2. No acute intracranial abnormality.  No underlying skull fracture. 3. No maxillofacial fracture. 4. No cervical spine fracture. Electronically Signed   By: Misty Stanley M.D.   On: 10/20/2017 20:38   Ct Cervical Spine Wo Contrast  Result Date: 10/20/2017 CLINICAL DATA:  [address] face forward onto concrete. Forehead laceration. Loss of consciousness. EXAM: CT HEAD WITHOUT CONTRAST CT MAXILLOFACIAL WITHOUT CONTRAST CT CERVICAL SPINE WITHOUT CONTRAST TECHNIQUE: Multidetector CT imaging of the head, cervical spine, and maxillofacial structures were performed using the standard protocol without intravenous contrast. Multiplanar CT image reconstructions of the cervical spine and maxillofacial structures were also generated. COMPARISON:  Orbit CT 10/23/2008 FINDINGS: CT HEAD FINDINGS Brain: There is no evidence for acute hemorrhage, hydrocephalus, mass lesion, or abnormal extra-axial fluid collection. No definite CT evidence for acute infarction. Vascular: No hyperdense vessel or unexpected calcification. Skull: No evidence for fracture. No worrisome lytic or sclerotic lesion. Other: Large soft tissue laceration noted left frontal scalp. CT MAXILLOFACIAL FINDINGS Osseous: No fracture or mandibular dislocation. No destructive process. Orbits: Negative. No traumatic or inflammatory finding. Sinuses: Postsurgical change noted left maxillary sinuses, both of which are completely opacified. Remaining visualized paranasal sinuses are clear. Posterior mastoid fluid identified bilaterally, left greater than right. Soft tissues: Unremarkable. CT  CERVICAL SPINE FINDINGS Alignment: Straightening of normal cervical lordosis. Skull base and vertebrae: No acute fracture. No primary bone lesion or focal pathologic process. Soft tissues and spinal canal: No prevertebral fluid or swelling. No visible canal hematoma. Disc levels:  Preserved. Upper chest: Symmetric biapical scarring. Other: None. IMPRESSION: 1. Large soft tissue defect left frontal scalp. 2. No acute intracranial abnormality.  No underlying skull fracture. 3. No maxillofacial fracture. 4. No cervical spine fracture. Electronically Signed   By: Misty Stanley M.D.   On: 10/20/2017 20:38   Ct Maxillofacial Wo Contrast  Result Date: 10/20/2017 CLINICAL DATA:  [address] face forward onto concrete. Forehead laceration. Loss of consciousness. EXAM: CT HEAD WITHOUT CONTRAST CT MAXILLOFACIAL WITHOUT CONTRAST CT CERVICAL SPINE WITHOUT CONTRAST TECHNIQUE: Multidetector CT imaging of the head, cervical spine, and maxillofacial structures were performed using the standard protocol without intravenous contrast. Multiplanar CT image reconstructions of the cervical spine and maxillofacial structures were also generated. COMPARISON:  Orbit CT 10/23/2008 FINDINGS: CT HEAD FINDINGS Brain: There is no evidence for acute hemorrhage, hydrocephalus, mass lesion, or abnormal extra-axial fluid collection. No definite CT evidence for acute infarction. Vascular: No hyperdense vessel or unexpected calcification. Skull: No evidence for fracture. No worrisome lytic or sclerotic lesion. Other: Large soft tissue laceration noted left frontal scalp. CT MAXILLOFACIAL FINDINGS Osseous: No fracture or mandibular dislocation. No destructive process. Orbits: Negative. No traumatic or inflammatory finding. Sinuses: Postsurgical change noted left maxillary sinuses, both of which are completely opacified. Remaining visualized paranasal sinuses are clear. Posterior mastoid fluid identified bilaterally, left greater than right. Soft tissues:  Unremarkable. CT CERVICAL SPINE FINDINGS Alignment: Straightening of normal cervical lordosis. Skull base and vertebrae: No acute fracture. No primary bone lesion or focal pathologic process. Soft tissues and spinal canal: No prevertebral fluid or swelling. No visible canal hematoma. Disc levels:  Preserved. Upper chest: Symmetric biapical scarring. Other: None. IMPRESSION: 1. Large soft tissue defect left frontal scalp. 2. No acute intracranial abnormality.  No underlying skull fracture. 3. No maxillofacial fracture. 4. No cervical spine fracture. Electronically Signed   By: Randall Hiss  Tery Sanfilippo M.D.   On: 10/20/2017 20:38    Procedures .Marland KitchenLaceration Repair Date/Time: 10/21/2017 12:04 AM Performed by: Corrie Dandy, MD Authorized by: Corrie Dandy, MD   Consent:    Consent obtained:  Verbal   Consent given by:  Patient   Risks discussed:  Infection, pain, vascular damage, poor cosmetic result, poor wound healing, need for additional repair and nerve damage   Alternatives discussed:  Referral Anesthesia (see MAR for exact dosages):    Anesthesia method:  Topical application and local infiltration   Topical anesthetic:  LET   Local anesthetic:  Lidocaine 1% w/o epi Laceration details:    Location:  Face   Face location:  Forehead   Length (cm):  6   Laceration depth: to skull. Repair type:    Repair type:  Complex Pre-procedure details:    Preparation:  Patient was prepped and draped in usual sterile fashion and imaging obtained to evaluate for foreign bodies Exploration:    Limited defect created (wound extended): no     Hemostasis achieved with:  LET and direct pressure   Wound exploration: entire depth of wound probed and visualized     Wound extent: no foreign bodies/material noted and no underlying fracture noted  Nerve damage: possible.     Wound extent comment:  2cm galea tear   Contaminated: no   Treatment:    Area cleansed with:  Soap and water   Amount of cleaning:  Extensive    Irrigation solution:  Sterile saline   Irrigation volume:  1L   Irrigation method:  Syringe   Visualized foreign bodies/material removed: no     Debridement:  None   Undermining:  None   Scar revision: no   Subcutaneous repair:    Suture size:  6-0   Suture material:  Vicryl   Number of sutures:  7 Skin repair:    Repair method:  Sutures   Suture size:  6-0   Wound skin closure material used: vicryl.   Number of sutures:  12 Approximation:    Approximation:  Close Post-procedure details:    Dressing:  Antibiotic ointment and sterile dressing   Patient tolerance of procedure:  Tolerated well, no immediate complications   (including critical care time)  Medications Ordered in ED Medications  LORazepam (ATIVAN) injection 0.5 mg (has no administration in time range)  lidocaine (PF) (XYLOCAINE) 1 % injection 10 mL (has no administration in time range)  morphine 4 MG/ML injection 2 mg (2 mg Intravenous Given 10/20/17 1945)  ondansetron (ZOFRAN) injection 4 mg (4 mg Intravenous Given 10/20/17 2156)  lidocaine-EPINEPHrine-tetracaine (LET) solution (3 mLs Topical Given 10/20/17 2156)  fentaNYL (SUBLIMAZE) injection 25 mcg (25 mcg Intravenous Given 10/20/17 2156)     Initial Impression / Assessment and Plan / ED Course  I have reviewed the triage vital signs and the nursing notes.  Pertinent labs & imaging results that were available during my care of the patient were reviewed by me and considered in my medical decision making (see chart for details).    Patient is a 52 year old female with history of anxiety and diabetes who presents to the emergency department today for evaluation after fall with forehead lac.   Patient is afebrile, hemodynamically stable at presentation.  History & exam as detailed above.  No focal neurological deficits though does have significant forehead laceration.  Galea torn underneath.  CT head/C-spine/face obtained that shows no acute intracranial abnormality,  no fractures or malalignment including no underlying skull fracture.  Patient does have word finding difficulties but no other neurological signs or symptoms.  Suspect concussion and they were counseled on concussion and postconcussive syndrome.  Wound was closed as detailed above.  Tetanus up-to-date.  Was a mechanical fall.  Labs remarkable for only mild hypokalemia, hyponatremia.  Stable renal function.  CBC completely unremarkable.  Urinalysis also unremarkable.  Troponin undetectable x2 and no signs or symptoms to suggest ACS, dissection, AAA.  Tolerated wound closure well.  Counseled extensively on wound care, scar minimization techniques, and importance of follow-up within 1 week for wound recheck and reevaluation of concussion.  Strict return precautions provided.  Ambulates w/o ataxia and stable at time of discharge.  Case and plan of care discussed with Dr. Maryan Rued.  Final Clinical Impressions(s) / ED Diagnoses   Final diagnoses:  Fall, initial encounter  Forehead laceration, initial encounter  Concussion without loss of consciousness, initial encounter    ED Discharge Orders    None       Corrie Dandy, MD 10/21/17 6122    Blanchie Dessert, MD 10/21/17 [phone]

## 2017-10-20 NOTE — ED Provider Notes (Signed)
Patient placed in [facility] pathway, seen and evaluated   Chief Complaint: near-syncope, fall, head lac  HPI:   Pt is a 52 y.o. female with a PMHx of DM2 and anemia, presenting today with c/o fall just PTA. States she was putting her lawnchair up and felt like her sugar had dropped. Got lightheaded and fell onto concrete, hit head, doesn't think she passed out just "felt very woozy". Has laceration to forehead. Ate a pinwheel cookie to try to bring her sugar up (didn't check it before eating it). Reports headache.  No CP, SOB, neck/back pain, vision changes. Not on blood thinners. Last Tdap 09/22/17  ROS: +head lac, +HA, +lightheadedness. No CP, SOB, neck/back pain, vision changes  Physical Exam:  BP (!) 167/132 (BP Location: Left Arm)   Pulse 72   Temp 98 F (36.7 C) (Oral)   Resp (!) 24   LMP 03/24/2013   SpO2 100%    Gen: crying, appears uncomfortable  Neuro: Awake and Alert x4  Skin: Warm, see focused exam below    Focused Exam: large laceration to L forehead which extends down to the scalp, small ?fracture noted to frontal bone, wound appears to completely go through the frontal musculature, pt unable to raise L eyebrow. Oozing bleeding, no active hemorrhage. No obvious retained FBs. No definite crepitus but hard to definitely determine this due to the tenderness during evaluation limiting amount of palpation that can be done. SEE PICTURE BELOW. No midline cervical spinal TTP, no bony stepoffs or deformities.      Initiation of care has begun. The patient has been counseled on the process, plan, and necessity for staying for the completion/evaluation, and the remainder of the medical screening examination   I informed nursing staff that this patient needs a bed ASAP.    [address], [place], [place] 10/20/17 1910    Blanchie Dessert, MD 10/21/17 [phone]

## 2017-10-20 NOTE — ED Notes (Signed)
Patient transported to CT 

## 2017-10-21 MED ORDER — POTASSIUM CHLORIDE 20 MEQ/15ML (10%) PO SOLN: 40.0000 meq | Freq: Two times a day (BID) | ORAL | Status: DC

## 2017-10-23 ENCOUNTER — Other Ambulatory Visit: Payer: Self-pay | Admitting: Family Medicine

## 2017-10-27 ENCOUNTER — Ambulatory Visit: Payer: BLUE CROSS/BLUE SHIELD | Admitting: Family Medicine

## 2017-10-27 ENCOUNTER — Encounter: Payer: Self-pay | Admitting: Family Medicine

## 2017-10-27 VITALS — BP 126/78 | HR 71 | Temp 97.8°F | Ht 62.0 in | Wt 147.0 lb

## 2017-10-27 DIAGNOSIS — Y92009 Unspecified place in unspecified non-institutional (private) residence as the place of occurrence of the external cause: Secondary | ICD-10-CM

## 2017-10-27 DIAGNOSIS — Z4802 Encounter for removal of sutures: Secondary | ICD-10-CM | POA: Diagnosis not present

## 2017-10-27 DIAGNOSIS — S060X9A Concussion with loss of consciousness of unspecified duration, initial encounter: Secondary | ICD-10-CM | POA: Insufficient documentation

## 2017-10-27 DIAGNOSIS — S060XAA Concussion with loss of consciousness status unknown, initial encounter: Secondary | ICD-10-CM | POA: Insufficient documentation

## 2017-10-27 DIAGNOSIS — S060X0D Concussion without loss of consciousness, subsequent encounter: Secondary | ICD-10-CM

## 2017-10-27 DIAGNOSIS — W19XXXD Unspecified fall, subsequent encounter: Secondary | ICD-10-CM | POA: Diagnosis not present

## 2017-10-27 NOTE — Progress Notes (Signed)
Subjective:   Patient ID: Hannah Rocha, female    DOB: [birth year], 52 y.o.   MRN: [id number]  Hannah Rocha is a pleasant 52 y.o. year old female who presents to clinic today with Hospitalization Follow-up (Patient is here today for a Hosptital F/U.  She was seen at [facility] from 7.31.19-8.1.19.  She lost her footing and landed face first on her garage floor.  Denies Syncope.  Large laceration and defect to forehead: 6cm laceration above her left eyebrow with bone exposure.  17mm galea tear.  7 subcutaneous sutures and 12 epidermal sutures.  Applied abx ointment and covered.  Today she has 2 healing black eyes.  There is no erythema or purulence at suture area.  She states she feels well today.)  on 10/27/2017  HPI:  ER follow up-  Was seen in the ER on 10/20/2017 after she fell in her garage.  Notes reviewed.   She lost her footing and fell face first onto the floor. She had turned her head quickly just prior to the fall.  Denied LOC.  Large laceration over her forehead on presenting to the ER. She did have a HA, some lightheadedness after the injury.  Head and facial CTs showed soft tissue swelling without fracture.  Tetanus up-to-date. Was a mechanical fall. Labs remarkable for only mild hypokalemia, hyponatremia. Stable renal function. CBC completely unremarkable. Urinalysis also unremarkable. Troponin undetectable x2 and no signs or symptoms to suggest ACS, dissection, AAA.   Today, the swelling has gone down.  Has been trying to rest her brain as much as possible- not looking at her phone, no TV.  When she tried to watch a little TV last night, she felt a little queasy so she stopped.     Ct Head Wo Contrast  Result Date: 10/20/2017 CLINICAL DATA:  [place] face forward onto concrete. Forehead laceration. Loss of consciousness. EXAM: CT HEAD WITHOUT CONTRAST CT MAXILLOFACIAL WITHOUT CONTRAST CT CERVICAL SPINE WITHOUT CONTRAST TECHNIQUE: Multidetector CT imaging of the  head, cervical spine, and maxillofacial structures were performed using the standard protocol without intravenous contrast. Multiplanar CT image reconstructions of the cervical spine and maxillofacial structures were also generated. COMPARISON:  Orbit CT 10/23/2008 FINDINGS: CT HEAD FINDINGS Brain: There is no evidence for acute hemorrhage, hydrocephalus, mass lesion, or abnormal extra-axial fluid collection. No definite CT evidence for acute infarction. Vascular: No hyperdense vessel or unexpected calcification. Skull: No evidence for fracture. No worrisome lytic or sclerotic lesion. Other: Large soft tissue laceration noted left frontal scalp. CT MAXILLOFACIAL FINDINGS Osseous: No fracture or mandibular dislocation. No destructive process. Orbits: Negative. No traumatic or inflammatory finding. Sinuses: Postsurgical change noted left maxillary sinuses, both of which are completely opacified. Remaining visualized paranasal sinuses are clear. Posterior mastoid fluid identified bilaterally, left greater than right. Soft tissues: Unremarkable. CT CERVICAL SPINE FINDINGS Alignment: Straightening of normal cervical lordosis. Skull base and vertebrae: No acute fracture. No primary bone lesion or focal pathologic process. Soft tissues and spinal canal: No prevertebral fluid or swelling. No visible canal hematoma. Disc levels:  Preserved. Upper chest: Symmetric biapical scarring. Other: None. IMPRESSION: 1. Large soft tissue defect left frontal scalp. 2. No acute intracranial abnormality.  No underlying skull fracture. 3. No maxillofacial fracture. 4. No cervical spine fracture. Electronically Signed   By: Misty Stanley M.D.   On: 10/20/2017 20:38   Ct Cervical Spine Wo Contrast  Result Date: 10/20/2017 CLINICAL DATA:  [place] face forward onto concrete. Forehead laceration. Loss of consciousness. EXAM:  CT HEAD WITHOUT CONTRAST CT MAXILLOFACIAL WITHOUT CONTRAST CT CERVICAL SPINE WITHOUT CONTRAST TECHNIQUE: Multidetector  CT imaging of the head, cervical spine, and maxillofacial structures were performed using the standard protocol without intravenous contrast. Multiplanar CT image reconstructions of the cervical spine and maxillofacial structures were also generated. COMPARISON:  Orbit CT 10/23/2008 FINDINGS: CT HEAD FINDINGS Brain: There is no evidence for acute hemorrhage, hydrocephalus, mass lesion, or abnormal extra-axial fluid collection. No definite CT evidence for acute infarction. Vascular: No hyperdense vessel or unexpected calcification. Skull: No evidence for fracture. No worrisome lytic or sclerotic lesion. Other: Large soft tissue laceration noted left frontal scalp. CT MAXILLOFACIAL FINDINGS Osseous: No fracture or mandibular dislocation. No destructive process. Orbits: Negative. No traumatic or inflammatory finding. Sinuses: Postsurgical change noted left maxillary sinuses, both of which are completely opacified. Remaining visualized paranasal sinuses are clear. Posterior mastoid fluid identified bilaterally, left greater than right. Soft tissues: Unremarkable. CT CERVICAL SPINE FINDINGS Alignment: Straightening of normal cervical lordosis. Skull base and vertebrae: No acute fracture. No primary bone lesion or focal pathologic process. Soft tissues and spinal canal: No prevertebral fluid or swelling. No visible canal hematoma. Disc levels:  Preserved. Upper chest: Symmetric biapical scarring. Other: None. IMPRESSION: 1. Large soft tissue defect left frontal scalp. 2. No acute intracranial abnormality.  No underlying skull fracture. 3. No maxillofacial fracture. 4. No cervical spine fracture. Electronically Signed   By: Misty Stanley M.D.   On: 10/20/2017 20:38   Ct Maxillofacial Wo Contrast  Result Date: 10/20/2017 CLINICAL DATA:  [place] face forward onto concrete. Forehead laceration. Loss of consciousness. EXAM: CT HEAD WITHOUT CONTRAST CT MAXILLOFACIAL WITHOUT CONTRAST CT CERVICAL SPINE WITHOUT CONTRAST  TECHNIQUE: Multidetector CT imaging of the head, cervical spine, and maxillofacial structures were performed using the standard protocol without intravenous contrast. Multiplanar CT image reconstructions of the cervical spine and maxillofacial structures were also generated. COMPARISON:  Orbit CT 10/23/2008 FINDINGS: CT HEAD FINDINGS Brain: There is no evidence for acute hemorrhage, hydrocephalus, mass lesion, or abnormal extra-axial fluid collection. No definite CT evidence for acute infarction. Vascular: No hyperdense vessel or unexpected calcification. Skull: No evidence for fracture. No worrisome lytic or sclerotic lesion. Other: Large soft tissue laceration noted left frontal scalp. CT MAXILLOFACIAL FINDINGS Osseous: No fracture or mandibular dislocation. No destructive process. Orbits: Negative. No traumatic or inflammatory finding. Sinuses: Postsurgical change noted left maxillary sinuses, both of which are completely opacified. Remaining visualized paranasal sinuses are clear. Posterior mastoid fluid identified bilaterally, left greater than right. Soft tissues: Unremarkable. CT CERVICAL SPINE FINDINGS Alignment: Straightening of normal cervical lordosis. Skull base and vertebrae: No acute fracture. No primary bone lesion or focal pathologic process. Soft tissues and spinal canal: No prevertebral fluid or swelling. No visible canal hematoma. Disc levels:  Preserved. Upper chest: Symmetric biapical scarring. Other: None. IMPRESSION: 1. Large soft tissue defect left frontal scalp. 2. No acute intracranial abnormality.  No underlying skull fracture. 3. No maxillofacial fracture. 4. No cervical spine fracture. Electronically Signed   By: Misty Stanley M.D.   On: 10/20/2017 20:38    Current Outpatient Medications on File Prior to Visit  Medication Sig Dispense Refill  . acetaminophen (TYLENOL) 500 MG tablet Take 500 mg by mouth every 6 (six) hours as needed for mild pain.     . B Complex Vitamins (VITAMIN B  COMPLEX PO) Take 1 tablet by mouth daily.    . busPIRone (BUSPAR) 30 MG tablet TAKE 1 TABLET BY MOUTH EVERY MORNING 1/2 TAB AT LUNCH,  AND 1/2 AT BEDTIME 180 tablet 1  . escitalopram (LEXAPRO) 10 MG tablet TAKE 1 TABLET BY MOUTH EVERY DAY 30 tablet 3  . flintstones complete (FLINTSTONES) 60 MG chewable tablet Chew 1 tablet by mouth daily.    Marland Kitchen glucose blood (ONETOUCH VERIO) test strip Use as instructed to test blood sugar twice daily e11.9 100 each 12  . ibuprofen (ADVIL,MOTRIN) 800 MG tablet Take 1 tablet (800 mg total) by mouth every 8 (eight) hours as needed. (Patient taking differently: Take 800 mg by mouth every 8 (eight) hours as needed for moderate pain. ) 60 tablet 12  . metFORMIN (GLUCOPHAGE) 500 MG tablet Take 1 tablet (500 mg total) by mouth daily with breakfast. 90 tablet 3  . ONETOUCH DELICA LANCETS 19J MISC Use to test blood sugar once daily E11.9 100 each 1  . pantoprazole (PROTONIX) 40 MG tablet Take 1 tablet (40 mg total) by mouth daily. 30 tablet 11   No current facility-administered medications on file prior to visit.     Allergies  Allergen Reactions  . Sertraline Hcl     REACTION: vomiting, severe anxiety    Past Medical History:  Diagnosis Date  . Allergy   . Anemia    hx  . Anxiety   . Diabetes mellitus without complication ([place])   . GERD (gastroesophageal reflux disease)   . H/O hiatal hernia   . Obesity   . PONV (postoperative nausea and vomiting)     Past Surgical History:  Procedure Laterality Date  . ABDOMINAL HYSTERECTOMY    . ASD REPAIR  1999   [facility]  . BREAST CYST ASPIRATION Right 2013  . CLEFT LIP REPAIR    . LAPAROSCOPIC ASSISTED VAGINAL HYSTERECTOMY Bilateral 04/13/2013   Procedure: LAPAROSCOPIC ASSISTED VAGINAL HYSTERECTOMY;  Surgeon: Emily Filbert, MD;  Location: [facility];  Service: Gynecology;  Laterality: Bilateral;  . LAPAROSCOPIC TUBAL LIGATION  03/21/2012   Procedure: LAPAROSCOPIC TUBAL LIGATION;  Surgeon: Emily Filbert, MD;  Location: [facility];   Service: Gynecology;  Laterality: Bilateral;  . MANDIBLE FRACTURE SURGERY    . Plantar Fascitis Repair  01/2015  . TUBAL LIGATION      Family History  Problem Relation Age of Onset  . Heart disease Mother   . Colon polyps Mother   . Diabetes Mother   . Prostate cancer Father   . Heart disease Father   . Cancer Father        prostate  . Diabetes Maternal Aunt   . Diabetes Paternal Grandmother   . Colon cancer Neg Hx   . Breast cancer Neg Hx     Social History   Socioeconomic History  . Marital status: Married    Spouse name: Not on file  . Number of children: 0  . Years of education: Not on file  . Highest education level: Not on file  Occupational History  . 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    Employer: [employer]  Social Needs  . Financial resource strain: Not on file  . Food insecurity:    Worry: Not on file    Inability: Not on file  . Transportation needs:    Medical: Not on file    Non-medical: Not on file  Tobacco Use  . Smoking status: Former Smoker    Packs/day: 0.00    Years: 0.00    Pack years: 0.00    Types: Cigarettes    Last attempt to quit: 02/21/1987    Years since quitting: 30.7  .  Smokeless tobacco: Never Used  . Tobacco comment: 22 years ago-light smoker  Substance and Sexual Activity  . Alcohol use: Yes    Alcohol/week: 0.0 oz    Comment: occasional - hard apple cider, shot  . Drug use: No  . Sexual activity: Yes    Partners: Male    Birth control/protection: Surgical    Comment: tubaligation/hysterctomy  Lifestyle  . Physical activity:    Days per week: Not on file    Minutes per session: Not on file  . Stress: Not on file  Relationships  . Social connections:    Talks on phone: Not on file    Gets together: Not on file    Attends religious service: Not on file    Active member of club or organization: Not on file    Attends meetings of clubs or organizations: Not on file    Relationship status: Not on file  .  Intimate partner violence:    Fear of current or ex partner: Not on file    Emotionally abused: Not on file    Physically abused: Not on file    Forced sexual activity: Not on file  Other Topics Concern  . Not on file  Social History Narrative   Daily caffeine use   The PMH, PSH, Social History, Family History, Medications, and allergies have been reviewed in [facility], and have been updated if relevant.   Review of Systems  Constitutional: Negative.   Eyes: Negative for visual disturbance.  Gastrointestinal: Positive for nausea. Negative for abdominal distention, abdominal pain, anal bleeding, blood in stool, constipation, diarrhea, rectal pain and vomiting.  Skin: Negative.   Neurological: Positive for headaches. Negative for dizziness, tremors, seizures, syncope, facial asymmetry, speech difficulty, weakness, light-headedness and numbness.  Hematological: Negative.   Psychiatric/Behavioral: Negative.   All other systems reviewed and are negative.      Objective:    BP 126/78 (BP Location: Left Arm, Patient Position: Sitting, Cuff Size: Normal)   Pulse 71   Temp 97.8 F (36.6 C) (Oral)   Ht 5\' 2"  (1.575 m)   Wt 147 lb (66.7 kg)   LMP 03/24/2013   SpO2 99%   BMI 26.89 kg/m    Physical Exam  Constitutional: She is oriented to person, place, and time. She appears well-developed and well-nourished. No distress.  HENT:  Still has some soft tissue swelling above left eye/forehead (per pt, much improved). Sutures c/d/i  Eyes: Pupils are equal, round, and reactive to light.  Cardiovascular: Normal rate.  Pulmonary/Chest: Effort normal.  Neurological: She is alert and oriented to person, place, and time. She displays normal reflexes. No cranial nerve deficit. She exhibits normal muscle tone. Coordination normal.  Skin: Skin is warm and dry. She is not diaphoretic.  Psychiatric: She has a normal mood and affect. Her behavior is normal. Judgment and thought content normal.  Nursing  note and vitals reviewed.         Assessment & Plan:   Fall at home, subsequent encounter  Encounter for removal of sutures  Concussion without loss of consciousness, subsequent encounter No follow-ups on file.

## 2017-10-27 NOTE — Assessment & Plan Note (Signed)
ER notes, Labs, images reviewed and discussed with patient.

## 2017-10-27 NOTE — Assessment & Plan Note (Signed)
Discussed importance of continuing brain rest- will write her out of work for at least a week.  Have her follow up with one of my partners next week, as I will not be here. The patient indicates understanding of these issues and agrees with the plan.

## 2017-10-27 NOTE — Patient Instructions (Signed)
Please make an appointment to see Dr. Raeford Razor next week.

## 2017-10-27 NOTE — Assessment & Plan Note (Signed)
The wound is well healed without signs of infection.  The sutures are removed. Wound care and activity instructions given. Return prn.

## 2017-10-28 ENCOUNTER — Encounter: Payer: Self-pay | Admitting: Family Medicine

## 2017-10-28 ENCOUNTER — Encounter: Payer: Self-pay | Admitting: Gastroenterology

## 2017-10-28 ENCOUNTER — Telehealth: Payer: Self-pay

## 2017-10-28 NOTE — Telephone Encounter (Signed)
Dr. Deborra Medina please advise, pt request for [place] to back date the out of work note from 10/25/17-11/07/17.

## 2017-10-28 NOTE — Telephone Encounter (Signed)
Copied from [facility] [id number]. Topic: Inquiry >> Oct 28, 2017  9:12 AM Vernona Rieger wrote: Reason for CRM: Patient states she was in the office yesterday 8/7 and Dr Deborra Medina wrote her a work note. Her job is requiring that it be re-written. It needs to say that she was out of work 8/5 and may return on 8/19. Patient would like to pick that up Wednesday when she comes to see Clearance Coots.

## 2017-10-28 NOTE — Telephone Encounter (Signed)
Pt is aware someone will call her once work note is ready

## 2017-10-28 NOTE — Telephone Encounter (Signed)
Okay to rewrite note as requested.

## 2017-10-28 NOTE — Telephone Encounter (Signed)
Letter done, waiting for signature and will place up front of the pt to pick next week.

## 2017-11-03 ENCOUNTER — Ambulatory Visit: Payer: BLUE CROSS/BLUE SHIELD | Admitting: Family Medicine

## 2017-11-03 ENCOUNTER — Encounter: Payer: Self-pay | Admitting: Family Medicine

## 2017-11-03 ENCOUNTER — Ambulatory Visit (INDEPENDENT_AMBULATORY_CARE_PROVIDER_SITE_OTHER): Payer: BLUE CROSS/BLUE SHIELD

## 2017-11-03 VITALS — BP 136/74 | HR 65 | Ht 62.0 in | Wt 146.0 lb

## 2017-11-03 DIAGNOSIS — M25561 Pain in right knee: Secondary | ICD-10-CM

## 2017-11-03 DIAGNOSIS — S060X0D Concussion without loss of consciousness, subsequent encounter: Secondary | ICD-10-CM

## 2017-11-03 NOTE — Patient Instructions (Signed)
Nice to meet you  Please try light exercise.  Please try starting to use the computer and monitor your symptoms. Please take a break if you start to have symptoms and then go back to the computer.  Please try looking past the computer at a distance every 20 minutes or so.  Please try heat and compression on the knee.  Please let me know if you have any certain symptoms when you start work next week. I can provide you with a work note with limitations.  Please follow up with me in 2 weeks.

## 2017-11-03 NOTE — Assessment & Plan Note (Signed)
Appears to have a small hematoma.  - compression and heat  - f/u PRN

## 2017-11-03 NOTE — Progress Notes (Signed)
Hannah Rocha - 52 y.o. female MRN [id number]  Date of birth: Apr 11, 1965  SUBJECTIVE:  Including CC & ROS.  Chief Complaint  Patient presents with  . Follow-up    Hannah Rocha is a 52 y.o. female that is here today for concussion evaluation. She fell two weeks ago in her garage fell forward and hit her forehead. Denies headaches or photophobia. She does have some pain near her left temple. She did have one instance of nausea when she was watching TV her symptoms subsided shortly after she laid down. She is an underwriter-requires using a computer daily. She has not watched tv or used her computer. She has been sleeping well. She denies history of concussion. No dizziness or trouble with balance.   The laceration on her forehead is well healed. She is able to close her left eye with no problems. She has some fullness of the left eyelid. No changes in her vision.   She does have bruising and swelling on her right knee. Denies pain during flexion and extension. She has not put anything on her knee. Has a bump on her inferior pole of her patella. No significant pain. No locking or giving way.    Review of the CT cervical spine/head/maxillofacial from 7/31 reveals a large soft tissue defect in the left frontal scalp.  No acute intracranial abnormality and no skull fracture.  No maxillofacial fracture or cervical spine fracture.   Review of Systems  Constitutional: Negative for fever.  HENT: Negative for congestion.   Eyes: Negative for visual disturbance.  Respiratory: Negative for cough.   Cardiovascular: Negative for chest pain.  Gastrointestinal: Positive for nausea. Negative for abdominal pain.  Musculoskeletal: Negative for gait problem.  Neurological: Negative for weakness.  Hematological: Negative for adenopathy.  Psychiatric/Behavioral: Negative for agitation.    HISTORY: Past Medical, Surgical, Social, and Family History Reviewed & Updated per EMR.   Pertinent  Historical Findings include:  Past Medical History:  Diagnosis Date  . Allergy   . Anemia    hx  . Anxiety   . Diabetes mellitus without complication ([place])   . GERD (gastroesophageal reflux disease)   . H/O hiatal hernia   . Obesity   . PONV (postoperative nausea and vomiting)     Past Surgical History:  Procedure Laterality Date  . ABDOMINAL HYSTERECTOMY    . ASD REPAIR  1999   [facility]  . BREAST CYST ASPIRATION Right 2013  . CLEFT LIP REPAIR    . LAPAROSCOPIC ASSISTED VAGINAL HYSTERECTOMY Bilateral 04/13/2013   Procedure: LAPAROSCOPIC ASSISTED VAGINAL HYSTERECTOMY;  Surgeon: Emily Filbert, MD;  Location: [facility];  Service: Gynecology;  Laterality: Bilateral;  . LAPAROSCOPIC TUBAL LIGATION  03/21/2012   Procedure: LAPAROSCOPIC TUBAL LIGATION;  Surgeon: Emily Filbert, MD;  Location: [facility];  Service: Gynecology;  Laterality: Bilateral;  . MANDIBLE FRACTURE SURGERY    . Plantar Fascitis Repair  01/2015  . TUBAL LIGATION      Allergies  Allergen Reactions  . Sertraline Hcl     REACTION: vomiting, severe anxiety    Family History  Problem Relation Age of Onset  . Heart disease Mother   . Colon polyps Mother   . Diabetes Mother   . Prostate cancer Father   . Heart disease Father   . Cancer Father        prostate  . Diabetes Maternal Aunt   . Diabetes Paternal Grandmother   . Colon cancer Neg Hx   . Breast  cancer Neg Hx      Social History   Socioeconomic History  . Marital status: Married    Spouse name: Not on file  . Number of children: 0  . Years of education: Not on file  . Highest education level: Not on file  Occupational History  . 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    Employer: [employer]  Social Needs  . Financial resource strain: Not on file  . Food insecurity:    Worry: Not on file    Inability: Not on file  . Transportation needs:    Medical: Not on file    Non-medical: Not on file  Tobacco Use  . Smoking status: Former Smoker     Packs/day: 0.00    Years: 0.00    Pack years: 0.00    Types: Cigarettes    Last attempt to quit: 02/21/1987    Years since quitting: 30.7  . Smokeless tobacco: Never Used  . Tobacco comment: 22 years ago-light smoker  Substance and Sexual Activity  . Alcohol use: Yes    Alcohol/week: 0.0 standard drinks    Comment: occasional - hard apple cider, shot  . Drug use: No  . Sexual activity: Yes    Partners: Male    Birth control/protection: Surgical    Comment: tubaligation/hysterctomy  Lifestyle  . Physical activity:    Days per week: Not on file    Minutes per session: Not on file  . Stress: Not on file  Relationships  . Social connections:    Talks on phone: Not on file    Gets together: Not on file    Attends religious service: Not on file    Active member of club or organization: Not on file    Attends meetings of clubs or organizations: Not on file    Relationship status: Not on file  . Intimate partner violence:    Fear of current or ex partner: Not on file    Emotionally abused: Not on file    Physically abused: Not on file    Forced sexual activity: Not on file  Other Topics Concern  . Not on file  Social History Narrative   Daily caffeine use     PHYSICAL EXAM:  VS: BP 136/74 (BP Location: Left Arm, Patient Position: Sitting, Cuff Size: Normal)   Pulse 65   Ht 5\' 2"  (1.575 m)   Wt 146 lb (66.2 kg)   LMP 03/24/2013   SpO2 98%   BMI 26.70 kg/m  Physical Exam Gen: NAD, alert, cooperative with exam, well-appearing ENT: normal lips, normal nasal mucosa,  Eye: normal EOM, normal conjunctiva and lids CV:  no edema, +2 pedal pulses   Resp: no accessory muscle use, non-labored,  Skin: no rashes, no areas of induration  Neuro: normal tone, normal sensation to touch No imbalance with tandem standing  Unable to stand on one foot alone. Minimal symptoms with horizontal and vertical gaze. Mild symptoms with saccades testing  Psych:  normal insight, alert and  oriented MSK:  Right knee:  No effusion  Normal flexion and extension strength to resistance.  Small bump on the inferior pole of the patella.  Neurovascularly intact   Limited ultrasound: right knee:  No effusion  Normal QT  Small hypoechoic change superficial to the proximal patella tendon. Appears to be a maturing hematom.  Normal patellar tendon   Summary: appears to have small hematoma   Ultrasound and interpretation by Clearance Coots, MD  ASSESSMENT & PLAN:   Concussion Symptoms are related to looking at screen. No significant symptoms. Has been resting from the screen and has been out of work  - counseled on getting back to her normal activities. She can use electronics and measure her response. Counseled on techniques and adjustments to using electronics.  - can begin light exercise  - counseled on supportive care  - f/u in 2 weeks. If no improvement consider PT - can provide work note if she returns to work and needs any accommodations.   Acute pain of right knee Appears to have a small hematoma.  - compression and heat  - f/u PRN

## 2017-11-03 NOTE — Assessment & Plan Note (Addendum)
Symptoms are related to looking at screen. No significant symptoms. Has been resting from the screen and has been out of work  - counseled on getting back to her normal activities. She can use electronics and measure her response. Counseled on techniques and adjustments to using electronics.  - can begin light exercise  - counseled on supportive care  - f/u in 2 weeks. If no improvement consider PT - can provide work note if she returns to work and needs any accommodations.

## 2017-11-16 ENCOUNTER — Encounter: Payer: Self-pay | Admitting: Gastroenterology

## 2017-12-23 ENCOUNTER — Encounter: Payer: Self-pay | Admitting: Family Medicine

## 2017-12-23 ENCOUNTER — Ambulatory Visit: Payer: BLUE CROSS/BLUE SHIELD | Admitting: Family Medicine

## 2017-12-23 VITALS — BP 114/76 | HR 57 | Temp 98.4°F | Ht 62.0 in | Wt 148.2 lb

## 2017-12-23 DIAGNOSIS — E119 Type 2 diabetes mellitus without complications: Secondary | ICD-10-CM | POA: Diagnosis not present

## 2017-12-23 DIAGNOSIS — Z23 Encounter for immunization: Secondary | ICD-10-CM

## 2017-12-23 DIAGNOSIS — E785 Hyperlipidemia, unspecified: Secondary | ICD-10-CM

## 2017-12-23 DIAGNOSIS — E559 Vitamin D deficiency, unspecified: Secondary | ICD-10-CM | POA: Diagnosis not present

## 2017-12-23 LAB — LIPID PANEL
CHOLESTEROL: 192 mg/dL (ref 0–200)
HDL: 40.9 mg/dL (ref >39.00)
LDL Cholesterol: 133 mg/dL — ABNORMAL HIGH (ref 0–99)
NONHDL: 150.93
Total CHOL/HDL Ratio: 5
Triglycerides: 91 mg/dL (ref 0.0–149.0)
VLDL: 18.2 mg/dL (ref 0.0–40.0)

## 2017-12-23 LAB — COMPREHENSIVE METABOLIC PANEL
ALK PHOS: 60 U/L (ref 39–117)
ALT: 14 U/L (ref 0–35)
AST: 13 U/L (ref 0–37)
Albumin: 4.4 g/dL (ref 3.5–5.2)
BILIRUBIN TOTAL: 0.5 mg/dL (ref 0.2–1.2)
BUN: 20 mg/dL (ref 6–23)
CO2: 29 mEq/L (ref 19–32)
CREATININE: 0.82 mg/dL (ref 0.40–1.20)
Calcium: 9.2 mg/dL (ref 8.4–10.5)
Chloride: 103 mEq/L (ref 96–112)
GFR: 77.3 mL/min (ref >60.00)
GLUCOSE: 121 mg/dL — AB (ref 70–99)
POTASSIUM: 3.9 meq/L (ref 3.5–5.1)
SODIUM: 137 meq/L (ref 135–145)
TOTAL PROTEIN: 7.5 g/dL (ref 6.0–8.3)

## 2017-12-23 LAB — VITAMIN D 25 HYDROXY (VIT D DEFICIENCY, FRACTURES): VITD: 31.54 ng/mL (ref 30.00–100.00)

## 2017-12-23 LAB — TSH: TSH: 3.59 u[IU]/mL (ref 0.35–4.50)

## 2017-12-23 LAB — HEMOGLOBIN A1C: HEMOGLOBIN A1C: 6.3 % (ref 4.6–6.5)

## 2017-12-23 MED ORDER — BUSPIRONE HCL 30 MG PO TABS: ORAL_TABLET | ORAL | 1 refills | Status: DC

## 2017-12-23 NOTE — Patient Instructions (Signed)
Great to see you. I will call you with your lab results from today and you can view them online.   

## 2017-12-23 NOTE — Assessment & Plan Note (Signed)
Doing well on current dose of Metformin based in her home FSBS readings. Will check a1c, TSH today. The patient indicates understanding of these issues and agrees with the plan.

## 2017-12-23 NOTE — Assessment & Plan Note (Signed)
Check Vit D today. The patient indicates understanding of these issues and agrees with the plan.

## 2017-12-23 NOTE — Progress Notes (Signed)
Subjective:   Patient ID: Hannah Rocha, female    DOB: [birth year], 52 y.o.   MRN: [id number]  Hannah Rocha is a pleasant 52 y.o. year old female who presents to clinic today with Follow-up (Patient is here today for a 55-month-F/U.  After having labs drawn at 7.3.19 OV her cholesterol was higher and pt asked to try and get her cholesterol down and then recheck in 3 months.  And if at that time her efforts did not work then she agreed to start cholesterol medication.  She agrees to get her flu shot today.  She is currently fasting.) and Rash (She is also C/O a rash in axillary bilaterally x17month.  It has been red, itchy but seems to be getting better but is unsure of what it is.)  on 12/23/2017  HPI:  Here for follow up- last saw her on 09/22/17 for follow up diabetes and HLD. Note reviewed.  DM- diabetes remained controlled with decreased dose of Metformin- 500 mg daily with breakfast. She does check her FSBS twice daily, fasting FSBS have been ranging 109- 142.  Denies any episodes of hypoglycemia.  Lab Results  Component Value Date   HGBA1C 6.5 09/22/2017   HLD- LDL not quite at goal for a diabetic.  Given low cholesterol diet in 09/2017 and she has been working on her diet.  Lab Results  Component Value Date   CHOL 195 09/22/2017   HDL 43.50 09/22/2017   LDLCALC 130 (H) 09/22/2017   LDLDIRECT 169.0 03/09/2016   TRIG 106.0 09/22/2017   CHOLHDL 4 09/22/2017   Lab Results  Component Value Date   TSH 2.89 12/17/2016   H/o Vit D deficiency- has been more tired.  Vit D not checked since 2018.  Current Outpatient Medications on File Prior to Visit  Medication Sig Dispense Refill  . acetaminophen (TYLENOL) 500 MG tablet Take 500 mg by mouth every 6 (six) hours as needed for mild pain.     . B Complex Vitamins (VITAMIN B COMPLEX PO) Take 1 tablet by mouth daily.    Marland Kitchen escitalopram (LEXAPRO) 10 MG tablet TAKE 1 TABLET BY MOUTH EVERY DAY 30 tablet 3  . flintstones  complete (FLINTSTONES) 60 MG chewable tablet Chew 1 tablet by mouth daily.    Marland Kitchen glucose blood (ONETOUCH VERIO) test strip Use as instructed to test blood sugar twice daily e11.9 100 each 12  . ibuprofen (ADVIL,MOTRIN) 800 MG tablet Take 1 tablet (800 mg total) by mouth every 8 (eight) hours as needed. (Patient taking differently: Take 800 mg by mouth every 8 (eight) hours as needed for moderate pain. ) 60 tablet 12  . metFORMIN (GLUCOPHAGE) 500 MG tablet Take 1 tablet (500 mg total) by mouth daily with breakfast. 90 tablet 3  . ONETOUCH DELICA LANCETS 94V MISC Use to test blood sugar once daily E11.9 100 each 1  . pantoprazole (PROTONIX) 40 MG tablet Take 1 tablet (40 mg total) by mouth daily. 30 tablet 11   No current facility-administered medications on file prior to visit.     Allergies  Allergen Reactions  . Sertraline Hcl     REACTION: vomiting, severe anxiety    Past Medical History:  Diagnosis Date  . Allergy   . Anemia    hx  . Anxiety   . Diabetes mellitus without complication ([place])   . GERD (gastroesophageal reflux disease)   . H/O hiatal hernia   . Obesity   . PONV (postoperative nausea and vomiting)  Past Surgical History:  Procedure Laterality Date  . ABDOMINAL HYSTERECTOMY    . ASD REPAIR  1999   [facility]  . BREAST CYST ASPIRATION Right 2013  . CLEFT LIP REPAIR    . LAPAROSCOPIC ASSISTED VAGINAL HYSTERECTOMY Bilateral 04/13/2013   Procedure: LAPAROSCOPIC ASSISTED VAGINAL HYSTERECTOMY;  Surgeon: Emily Filbert, MD;  Location: [facility];  Service: Gynecology;  Laterality: Bilateral;  . LAPAROSCOPIC TUBAL LIGATION  03/21/2012   Procedure: LAPAROSCOPIC TUBAL LIGATION;  Surgeon: Emily Filbert, MD;  Location: [facility];  Service: Gynecology;  Laterality: Bilateral;  . MANDIBLE FRACTURE SURGERY    . Plantar Fascitis Repair  01/2015  . TUBAL LIGATION      Family History  Problem Relation Age of Onset  . Heart disease Mother   . Colon polyps Mother   . Diabetes Mother   .  Prostate cancer Father   . Heart disease Father   . Cancer Father        prostate  . Diabetes Maternal Aunt   . Diabetes Paternal Grandmother   . Colon cancer Neg Hx   . Breast cancer Neg Hx     Social History   Socioeconomic History  . Marital status: Married    Spouse name: Not on file  . Number of children: 0  . Years of education: Not on file  . Highest education level: Not on file  Occupational History  . 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    Employer: [employer]  Social Needs  . Financial resource strain: Not on file  . Food insecurity:    Worry: Not on file    Inability: Not on file  . Transportation needs:    Medical: Not on file    Non-medical: Not on file  Tobacco Use  . Smoking status: Former Smoker    Packs/day: 0.00    Years: 0.00    Pack years: 0.00    Types: Cigarettes    Last attempt to quit: 02/21/1987    Years since quitting: 30.8  . Smokeless tobacco: Never Used  . Tobacco comment: 22 years ago-light smoker  Substance and Sexual Activity  . Alcohol use: Yes    Alcohol/week: 0.0 standard drinks    Comment: occasional - hard apple cider, shot  . Drug use: No  . Sexual activity: Yes    Partners: Male    Birth control/protection: Surgical    Comment: tubaligation/hysterctomy  Lifestyle  . Physical activity:    Days per week: Not on file    Minutes per session: Not on file  . Stress: Not on file  Relationships  . Social connections:    Talks on phone: Not on file    Gets together: Not on file    Attends religious service: Not on file    Active member of club or organization: Not on file    Attends meetings of clubs or organizations: Not on file    Relationship status: Not on file  . Intimate partner violence:    Fear of current or ex partner: Not on file    Emotionally abused: Not on file    Physically abused: Not on file    Forced sexual activity: Not on file  Other Topics Concern  . Not on file  Social History Narrative   Daily  caffeine use   The PMH, PSH, Social History, Family History, Medications, and allergies have been reviewed in [facility], and have been updated if relevant.  Review of Systems  Constitutional: Positive for fatigue.  HENT: Negative.   Eyes: Negative.   Respiratory: Negative.   Cardiovascular: Negative.   Gastrointestinal: Negative.   Endocrine: Negative.   Genitourinary: Negative.   Musculoskeletal: Negative.   Allergic/Immunologic: Negative.   Neurological: Negative.   Hematological: Negative.   Psychiatric/Behavioral: Negative.   All other systems reviewed and are negative.      Objective:    BP 114/76 (BP Location: Left Arm, Patient Position: Sitting, Cuff Size: Normal)   Pulse (!) 57   Temp 98.4 F (36.9 C) (Oral)   Ht 5\' 2"  (1.575 m)   Wt 148 lb 3.2 oz (67.2 kg)   LMP 03/24/2013   SpO2 98%   BMI 27.11 kg/m   Wt Readings from Last 3 Encounters:  12/23/17 148 lb 3.2 oz (67.2 kg)  11/03/17 146 lb (66.2 kg)  10/27/17 147 lb (66.7 kg)    Physical Exam  Constitutional: She is oriented to person, place, and time. She appears well-developed and well-nourished. No distress.  HENT:  Head: Normocephalic and atraumatic.  Eyes: EOM are normal.  Neck: Normal range of motion.  Cardiovascular: Normal rate and regular rhythm.  Pulmonary/Chest: Effort normal and breath sounds normal.  Musculoskeletal: Normal range of motion. She exhibits no edema.  Neurological: She is alert and oriented to person, place, and time. No cranial nerve deficit.  Skin: Skin is warm and dry. She is not diaphoretic.  Psychiatric: She has a normal mood and affect. Her behavior is normal. Judgment and thought content normal.  Nursing note and vitals reviewed.         Assessment & Plan:   Type 2 diabetes mellitus without complication, without long-term current use of insulin (HCC) - Plan: Hemoglobin A1c, TSH  Need for influenza vaccination - Plan: Flu Vaccine QUAD 6+ mos PF IM (Fluarix Quad  PF)  Hyperlipidemia, unspecified hyperlipidemia type - Plan: Comprehensive metabolic panel, Lipid panel No follow-ups on file.

## 2017-12-23 NOTE — Assessment & Plan Note (Signed)
Not at goal for diabetic but she has been working on her diet. Repeat lipid panel today. If remains above goal, start statin. The patient indicates understanding of these issues and agrees with the plan.

## 2017-12-24 ENCOUNTER — Encounter: Payer: Self-pay | Admitting: Family Medicine

## 2017-12-24 ENCOUNTER — Other Ambulatory Visit: Payer: Self-pay | Admitting: Family Medicine

## 2017-12-24 MED ORDER — PRAVASTATIN SODIUM 10 MG PO TABS: 10.0000 mg | ORAL_TABLET | Freq: Every day | ORAL | 3 refills | Status: DC

## 2017-12-27 ENCOUNTER — Other Ambulatory Visit: Payer: Self-pay

## 2017-12-27 DIAGNOSIS — E785 Hyperlipidemia, unspecified: Secondary | ICD-10-CM

## 2017-12-27 NOTE — Telephone Encounter (Signed)
Tried to call pt to schedule 8 week lab follow up. Future orders already placed. VM was full

## 2018-01-03 ENCOUNTER — Encounter: Payer: Self-pay | Admitting: Gastroenterology

## 2018-01-03 ENCOUNTER — Ambulatory Visit (AMBULATORY_SURGERY_CENTER): Payer: Self-pay | Admitting: *Deleted

## 2018-01-03 VITALS — Ht 64.0 in | Wt 153.0 lb

## 2018-01-03 DIAGNOSIS — Z8371 Family history of colonic polyps: Secondary | ICD-10-CM

## 2018-01-03 MED ORDER — NA SULFATE-K SULFATE-MG SULF 17.5-3.13-1.6 GM/177ML PO SOLN: 1.0000 | Freq: Once | ORAL | 0 refills | Status: AC

## 2018-01-03 NOTE — Progress Notes (Signed)
No egg or soy allergy known to patient  No issues with past sedation with any surgeries  or procedures, no intubation problems  No diet pills per patient No home 02 use per patient  No blood thinners per patient  Pt denies issues with constipation  No A fib or A flutter  EMMI video sent to pt's e mail - pt declined  Suprep $15 coupon to pt in [place] Pt had an OV with Dr stark 03-24-17- she had seen him 02/2016 for rectal bleeding, never had a colon after that OV- hr placed recall for 9-19 for  family hx colon polyps

## 2018-01-17 ENCOUNTER — Ambulatory Visit (AMBULATORY_SURGERY_CENTER): Payer: BLUE CROSS/BLUE SHIELD | Admitting: Gastroenterology

## 2018-01-17 ENCOUNTER — Encounter: Payer: Self-pay | Admitting: Gastroenterology

## 2018-01-17 VITALS — BP 138/75 | HR 57 | Temp 98.0°F | Resp 19 | Ht 62.0 in | Wt 148.0 lb

## 2018-01-17 DIAGNOSIS — D122 Benign neoplasm of ascending colon: Secondary | ICD-10-CM

## 2018-01-17 DIAGNOSIS — Z8371 Family history of colonic polyps: Secondary | ICD-10-CM | POA: Diagnosis not present

## 2018-01-17 DIAGNOSIS — Z1211 Encounter for screening for malignant neoplasm of colon: Secondary | ICD-10-CM | POA: Diagnosis not present

## 2018-01-17 MED ORDER — SODIUM CHLORIDE 0.9 % IV SOLN: 500.0000 mL | Freq: Once | INTRAVENOUS | Status: DC

## 2018-01-17 NOTE — Progress Notes (Signed)
Called to room to assist during endoscopic procedure.  Patient ID and intended procedure confirmed with present staff. Received instructions for my participation in the procedure from the performing physician.  

## 2018-01-17 NOTE — Op Note (Signed)
[facility] Patient Name: Hannah Rocha Procedure Date: 01/17/2018 11:29 AM MRN: [id number] Endoscopist: Ladene Artist , MD Age: 52 Referring MD:  Date of Birth: June 17, 1965 Gender: Female Account #: [id number] Procedure:                Colonoscopy Indications:              Colon cancer screening in patient at increased                            risk: Family history of 1st-degree relative with                            colon polyps Medicines:                Monitored Anesthesia Care Procedure:                Pre-Anesthesia Assessment:                           - Prior to the procedure, a History and Physical                            was performed, and patient medications and                            allergies were reviewed. The patient's tolerance of                            previous anesthesia was also reviewed. The risks                            and benefits of the procedure and the sedation                            options and risks were discussed with the patient.                            All questions were answered, and informed consent                            was obtained. Prior Anticoagulants: The patient has                            taken no previous anticoagulant or antiplatelet                            agents. ASA Grade Assessment: II - A patient with                            mild systemic disease. After reviewing the risks                            and benefits, the patient was deemed in  satisfactory condition to undergo the procedure.                           After obtaining informed consent, the colonoscope                            was passed under direct vision. Throughout the                            procedure, the patient's blood pressure, pulse, and                            oxygen saturations were monitored continuously. The                            Model PCF-[id number]) scope was  introduced                            through the anus and advanced to the the cecum,                            identified by appendiceal orifice and ileocecal                            valve. The ileocecal valve, appendiceal orifice,                            and rectum were photographed. The quality of the                            bowel preparation was good. The colonoscopy was                            performed without difficulty. The patient tolerated                            the procedure well. Scope In: 11:35:26 AM Scope Out: 11:51:11 AM Scope Withdrawal Time: 0 hours 12 minutes 3 seconds  Total Procedure Duration: 0 hours 15 minutes 45 seconds  Findings:                 The perianal and digital rectal examinations were                            normal.                           A 7 mm polyp was found in the ascending colon. The                            polyp was sessile. The polyp was removed with a                            cold snare. Resection and retrieval were complete.  A patchy area of mild melanosis was found in the                            descending colon and in the transverse colon.                           Internal hemorrhoids were found during                            retroflexion. The hemorrhoids were small and Grade                            I (internal hemorrhoids that do not prolapse).                           The exam was otherwise without abnormality on                            direct and retroflexion views. Complications:            No immediate complications. Estimated blood loss:                            None. Estimated Blood Loss:     Estimated blood loss: none. Impression:               - One 7 mm polyp in the ascending colon, removed                            with a cold snare. Resected and retrieved.                           - Melanosis in the colon.                           - Internal hemorrhoids.                            - The examination was otherwise normal on direct                            and retroflexion views. Recommendation:           - Repeat colonoscopy in 5 years for surveillance.                           - Patient has a contact number available for                            emergencies. The signs and symptoms of potential                            delayed complications were discussed with the                            patient. Return to normal activities  tomorrow.                            Written discharge instructions were provided to the                            patient.                           - Resume previous diet.                           - Continue present medications.                           - Await pathology results. Ladene Artist, MD 01/17/2018 12:00:11 PM This report has been signed electronically.

## 2018-01-17 NOTE — Progress Notes (Signed)
Pt's states no medical or surgical changes since previsit or office visit. 

## 2018-01-17 NOTE — Patient Instructions (Signed)
Continue present medications, Please read handouts on polyps and hemorrhoids. Await pathology results.     YOU HAD AN ENDOSCOPIC PROCEDURE TODAY AT [facility]:   Refer to the procedure report that was given to you for any specific questions about what was found during the examination.  If the procedure report does not answer your questions, please call your gastroenterologist to clarify.  If you requested that your care partner not be given the details of your procedure findings, then the procedure report has been included in a sealed envelope for you to review at your convenience later.  YOU SHOULD EXPECT: Some feelings of bloating in the abdomen. Passage of more gas than usual.  Walking can help get rid of the air that was put into your GI tract during the procedure and reduce the bloating. If you had a lower endoscopy (such as a colonoscopy or flexible sigmoidoscopy) you may notice spotting of blood in your stool or on the toilet paper. If you underwent a bowel prep for your procedure, you may not have a normal bowel movement for a few days.  Please Note:  You might notice some irritation and congestion in your nose or some drainage.  This is from the oxygen used during your procedure.  There is no need for concern and it should clear up in a day or so.  SYMPTOMS TO REPORT IMMEDIATELY:   Following lower endoscopy (colonoscopy or flexible sigmoidoscopy):  Excessive amounts of blood in the stool  Significant tenderness or worsening of abdominal pains  Swelling of the abdomen that is new, acute  Fever of 100F or higher    For urgent or emergent issues, a gastroenterologist can be reached at any hour by calling [phone].   DIET:  We do recommend a small meal at first, but then you may proceed to your regular diet.  Drink plenty of fluids but you should avoid alcoholic beverages for 24 hours.  ACTIVITY:  You should plan to take it easy for the rest of today and you  should NOT DRIVE or use heavy machinery until tomorrow (because of the sedation medicines used during the test).    FOLLOW UP: Our staff will call the number listed on your records the next business day following your procedure to check on you and address any questions or concerns that you may have regarding the information given to you following your procedure. If we do not reach you, we will leave a message.  However, if you are feeling well and you are not experiencing any problems, there is no need to return our call.  We will assume that you have returned to your regular daily activities without incident.  If any biopsies were taken you will be contacted by phone or by letter within the next 1-3 weeks.  Please call us at [phone] if you have not heard about the biopsies in 3 weeks.    SIGNATURES/CONFIDENTIALITY: You and/or your care partner have signed paperwork which will be entered into your electronic medical record.  These signatures attest to the fact that that the information above on your After Visit Summary has been reviewed and is understood.  Full responsibility of the confidentiality of this discharge information lies with you and/or your care-partner.

## 2018-01-17 NOTE — Progress Notes (Signed)
To recovery, report to RN, VSS. 

## 2018-01-18 ENCOUNTER — Telehealth: Payer: Self-pay

## 2018-01-18 NOTE — Telephone Encounter (Signed)
  Follow up Call-  Call back number 01/17/2018  Post procedure Call Back phone  # [phone]  Permission to leave phone message Yes  Some recent data might be hidden     Patient questions:  Do you have a fever, pain , or abdominal swelling? No. Pain Score  0 *  Have you tolerated food without any problems? Yes.    Have you been able to return to your normal activities? Yes.    Do you have any questions about your discharge instructions: Diet   No. Medications  No. Follow up visit  No.  Do you have questions or concerns about your Care? No.  Actions: * If pain score is 4 or above: No action needed, pain <4.

## 2018-01-28 ENCOUNTER — Encounter: Payer: Self-pay | Admitting: Gastroenterology

## 2018-02-02 ENCOUNTER — Encounter: Payer: Self-pay | Admitting: Nurse Practitioner

## 2018-02-02 ENCOUNTER — Ambulatory Visit: Payer: BLUE CROSS/BLUE SHIELD | Admitting: Nurse Practitioner

## 2018-02-02 VITALS — BP 146/76 | HR 90 | Temp 98.7°F | Ht 62.0 in | Wt 150.0 lb

## 2018-02-02 DIAGNOSIS — J209 Acute bronchitis, unspecified: Secondary | ICD-10-CM

## 2018-02-02 MED ORDER — GUAIFENESIN ER 600 MG PO TB12: 600.0000 mg | ORAL_TABLET | Freq: Two times a day (BID) | ORAL | 0 refills | Status: DC | PRN

## 2018-02-02 MED ORDER — HYDROCODONE-HOMATROPINE 5-1.5 MG/5ML PO SYRP: 5.0000 mL | ORAL_SOLUTION | Freq: Every evening | ORAL | 0 refills | Status: DC | PRN

## 2018-02-02 MED ORDER — ALBUTEROL SULFATE HFA 108 (90 BASE) MCG/ACT IN AERS: 1.0000 | INHALATION_SPRAY | Freq: Four times a day (QID) | RESPIRATORY_TRACT | 0 refills | Status: DC | PRN

## 2018-02-02 NOTE — Patient Instructions (Signed)

## 2018-02-02 NOTE — Progress Notes (Signed)
Subjective:  Patient ID: Hannah Rocha, female    DOB: 04/27/1965  Age: 52 y.o. MRN: [id number]  CC: Cough (pt is complaining of coughing,cant sleep,runny nose. going on 5 days. tried Diabetic Tussin DM. not helping much. )   Cough  This is a new problem. The current episode started in the past 7 days. The problem has been waxing and waning. The problem occurs constantly. The cough is productive of sputum. Associated symptoms include nasal congestion and rhinorrhea. Pertinent negatives include no chest pain, chills, ear congestion, ear pain, fever, headaches, heartburn, myalgias, sore throat, shortness of breath or wheezing. The symptoms are aggravated by lying down and cold air. Hannah Rocha has tried OTC cough suppressant for the symptoms. The treatment provided no relief.  benzonatate has been ineffective in past. Home glucose this morning 127.  Reviewed past Medical, Social and Family history today.  Outpatient Medications Prior to Visit  Medication Sig Dispense Refill  . acetaminophen (TYLENOL) 500 MG tablet Take 500 mg by mouth every 6 (six) hours as needed for mild pain.     . B Complex Vitamins (VITAMIN B COMPLEX PO) Take 1 tablet by mouth daily.    . busPIRone (BUSPAR) 30 MG tablet TAKE 1 TABLET BY MOUTH EVERY MORNING 1/2 TAB AT LUNCH, AND 1/2 AT BEDTIME 180 tablet 1  . escitalopram (LEXAPRO) 10 MG tablet TAKE 1 TABLET BY MOUTH EVERY DAY 30 tablet 3  . flintstones complete (FLINTSTONES) 60 MG chewable tablet Chew 1 tablet by mouth daily.    Marland Kitchen glucose blood (ONETOUCH VERIO) test strip Use as instructed to test blood sugar twice daily e11.9 100 each 12  . ibuprofen (ADVIL,MOTRIN) 800 MG tablet Take 1 tablet (800 mg total) by mouth every 8 (eight) hours as needed. (Patient taking differently: Take 800 mg by mouth every 8 (eight) hours as needed for moderate pain. ) 60 tablet 12  . metFORMIN (GLUCOPHAGE) 500 MG tablet Take 1 tablet (500 mg total) by mouth daily with breakfast. 90 tablet  3  . ONETOUCH DELICA LANCETS 67R MISC Use to test blood sugar once daily E11.9 100 each 1  . pantoprazole (PROTONIX) 40 MG tablet Take 1 tablet (40 mg total) by mouth daily. 30 tablet 11  . pravastatin (PRAVACHOL) 10 MG tablet Take 1 tablet (10 mg total) by mouth daily. 30 tablet 3   No facility-administered medications prior to visit.     ROS See HPI  Objective:  BP (!) 146/76   Pulse 90   Temp 98.7 F (37.1 C) (Oral)   Ht 5\' 2"  (1.575 m)   Wt 150 lb (68 kg)   LMP 03/24/2013   SpO2 99%   BMI 27.44 kg/m   BP Readings from Last 3 Encounters:  02/02/18 (!) 146/76  01/17/18 138/75  12/23/17 114/76    Wt Readings from Last 3 Encounters:  02/02/18 150 lb (68 kg)  01/17/18 148 lb (67.1 kg)  01/03/18 153 lb (69.4 kg)    Physical Exam  Constitutional: Hannah Rocha is oriented to person, place, and time. Hannah Rocha appears well-developed and well-nourished.  HENT:  Right Ear: Tympanic membrane, external ear and ear canal normal.  Left Ear: Tympanic membrane, external ear and ear canal normal.  Nose: Rhinorrhea present. No mucosal edema. Right sinus exhibits no maxillary sinus tenderness and no frontal sinus tenderness. Left sinus exhibits no maxillary sinus tenderness and no frontal sinus tenderness.  Mouth/Throat: Posterior oropharyngeal erythema present. No oropharyngeal exudate or posterior oropharyngeal edema. Tonsils are 1+ on  the right. Tonsils are 1+ on the left.  Eyes: Pupils are equal, round, and reactive to light. Conjunctivae and EOM are normal.  Neck: No thyromegaly present.  Cardiovascular: Normal rate and regular rhythm.  Pulmonary/Chest: Effort normal and breath sounds normal.  Lymphadenopathy:    Hannah Rocha has no cervical adenopathy.  Neurological: Hannah Rocha is alert and oriented to person, place, and time.  Vitals reviewed.   Lab Results  Component Value Date   WBC 7.6 10/20/2017   HGB 12.6 10/20/2017   HCT 38.0 10/20/2017   PLT 193 10/20/2017   GLUCOSE 121 (H) 12/23/2017    CHOL 192 12/23/2017   TRIG 91.0 12/23/2017   HDL 40.90 12/23/2017   LDLDIRECT 169.0 03/09/2016   LDLCALC 133 (H) 12/23/2017   ALT 14 12/23/2017   AST 13 12/23/2017   NA 137 12/23/2017   K 3.9 12/23/2017   CL 103 12/23/2017   CREATININE 0.82 12/23/2017   BUN 20 12/23/2017   CO2 29 12/23/2017   TSH 3.59 12/23/2017   HGBA1C 6.3 12/23/2017   MICROALBUR 30 06/23/2017    Ct Head Wo Contrast  Result Date: 10/20/2017 CLINICAL DATA:  [place] face forward onto concrete. Forehead laceration. Loss of consciousness. EXAM: CT HEAD WITHOUT CONTRAST CT MAXILLOFACIAL WITHOUT CONTRAST CT CERVICAL SPINE WITHOUT CONTRAST TECHNIQUE: Multidetector CT imaging of the head, cervical spine, and maxillofacial structures were performed using the standard protocol without intravenous contrast. Multiplanar CT image reconstructions of the cervical spine and maxillofacial structures were also generated. COMPARISON:  Orbit CT 10/23/2008 FINDINGS: CT HEAD FINDINGS Brain: There is no evidence for acute hemorrhage, hydrocephalus, mass lesion, or abnormal extra-axial fluid collection. No definite CT evidence for acute infarction. Vascular: No hyperdense vessel or unexpected calcification. Skull: No evidence for fracture. No worrisome lytic or sclerotic lesion. Other: Large soft tissue laceration noted left frontal scalp. CT MAXILLOFACIAL FINDINGS Osseous: No fracture or mandibular dislocation. No destructive process. Orbits: Negative. No traumatic or inflammatory finding. Sinuses: Postsurgical change noted left maxillary sinuses, both of which are completely opacified. Remaining visualized paranasal sinuses are clear. Posterior mastoid fluid identified bilaterally, left greater than right. Soft tissues: Unremarkable. CT CERVICAL SPINE FINDINGS Alignment: Straightening of normal cervical lordosis. Skull base and vertebrae: No acute fracture. No primary bone lesion or focal pathologic process. Soft tissues and spinal canal: No  prevertebral fluid or swelling. No visible canal hematoma. Disc levels:  Preserved. Upper chest: Symmetric biapical scarring. Other: None. IMPRESSION: 1. Large soft tissue defect left frontal scalp. 2. No acute intracranial abnormality.  No underlying skull fracture. 3. No maxillofacial fracture. 4. No cervical spine fracture. Electronically Signed   By: Misty Stanley M.D.   On: 10/20/2017 20:38   Ct Cervical Spine Wo Contrast  Result Date: 10/20/2017 CLINICAL DATA:  [place] face forward onto concrete. Forehead laceration. Loss of consciousness. EXAM: CT HEAD WITHOUT CONTRAST CT MAXILLOFACIAL WITHOUT CONTRAST CT CERVICAL SPINE WITHOUT CONTRAST TECHNIQUE: Multidetector CT imaging of the head, cervical spine, and maxillofacial structures were performed using the standard protocol without intravenous contrast. Multiplanar CT image reconstructions of the cervical spine and maxillofacial structures were also generated. COMPARISON:  Orbit CT 10/23/2008 FINDINGS: CT HEAD FINDINGS Brain: There is no evidence for acute hemorrhage, hydrocephalus, mass lesion, or abnormal extra-axial fluid collection. No definite CT evidence for acute infarction. Vascular: No hyperdense vessel or unexpected calcification. Skull: No evidence for fracture. No worrisome lytic or sclerotic lesion. Other: Large soft tissue laceration noted left frontal scalp. CT MAXILLOFACIAL FINDINGS Osseous: No fracture or mandibular dislocation.  No destructive process. Orbits: Negative. No traumatic or inflammatory finding. Sinuses: Postsurgical change noted left maxillary sinuses, both of which are completely opacified. Remaining visualized paranasal sinuses are clear. Posterior mastoid fluid identified bilaterally, left greater than right. Soft tissues: Unremarkable. CT CERVICAL SPINE FINDINGS Alignment: Straightening of normal cervical lordosis. Skull base and vertebrae: No acute fracture. No primary bone lesion or focal pathologic process. Soft tissues and  spinal canal: No prevertebral fluid or swelling. No visible canal hematoma. Disc levels:  Preserved. Upper chest: Symmetric biapical scarring. Other: None. IMPRESSION: 1. Large soft tissue defect left frontal scalp. 2. No acute intracranial abnormality.  No underlying skull fracture. 3. No maxillofacial fracture. 4. No cervical spine fracture. Electronically Signed   By: Misty Stanley M.D.   On: 10/20/2017 20:38   Ct Maxillofacial Wo Contrast  Result Date: 10/20/2017 CLINICAL DATA:  [place] face forward onto concrete. Forehead laceration. Loss of consciousness. EXAM: CT HEAD WITHOUT CONTRAST CT MAXILLOFACIAL WITHOUT CONTRAST CT CERVICAL SPINE WITHOUT CONTRAST TECHNIQUE: Multidetector CT imaging of the head, cervical spine, and maxillofacial structures were performed using the standard protocol without intravenous contrast. Multiplanar CT image reconstructions of the cervical spine and maxillofacial structures were also generated. COMPARISON:  Orbit CT 10/23/2008 FINDINGS: CT HEAD FINDINGS Brain: There is no evidence for acute hemorrhage, hydrocephalus, mass lesion, or abnormal extra-axial fluid collection. No definite CT evidence for acute infarction. Vascular: No hyperdense vessel or unexpected calcification. Skull: No evidence for fracture. No worrisome lytic or sclerotic lesion. Other: Large soft tissue laceration noted left frontal scalp. CT MAXILLOFACIAL FINDINGS Osseous: No fracture or mandibular dislocation. No destructive process. Orbits: Negative. No traumatic or inflammatory finding. Sinuses: Postsurgical change noted left maxillary sinuses, both of which are completely opacified. Remaining visualized paranasal sinuses are clear. Posterior mastoid fluid identified bilaterally, left greater than right. Soft tissues: Unremarkable. CT CERVICAL SPINE FINDINGS Alignment: Straightening of normal cervical lordosis. Skull base and vertebrae: No acute fracture. No primary bone lesion or focal pathologic process.  Soft tissues and spinal canal: No prevertebral fluid or swelling. No visible canal hematoma. Disc levels:  Preserved. Upper chest: Symmetric biapical scarring. Other: None. IMPRESSION: 1. Large soft tissue defect left frontal scalp. 2. No acute intracranial abnormality.  No underlying skull fracture. 3. No maxillofacial fracture. 4. No cervical spine fracture. Electronically Signed   By: Misty Stanley M.D.   On: 10/20/2017 20:38    Assessment & Plan:   Hannah Rocha was seen today for cough.  Diagnoses and all orders for this visit:  Acute bronchitis, unspecified organism -     albuterol (PROVENTIL HFA;VENTOLIN HFA) 108 (90 Base) MCG/ACT inhaler; Inhale 1-2 puffs into the lungs every 6 (six) hours as needed. -     HYDROcodone-homatropine (HYCODAN) 5-1.5 MG/5ML syrup; Take 5 mLs by mouth at bedtime as needed for cough. -     guaiFENesin (MUCINEX) 600 MG 12 hr tablet; Take 1 tablet (600 mg total) by mouth 2 (two) times daily as needed for cough or to loosen phlegm.   Hannah Rocha am Hannah Hannah Rocha start on albuterol, HYDROcodone-homatropine, and guaiFENesin. Hannah Rocha am also Hannah Rocha maintain Hannah Rocha flintstones complete, acetaminophen, B Complex Vitamins (VITAMIN B COMPLEX PO), ONETOUCH DELICA LANCETS 16X, glucose blood, ibuprofen, pantoprazole, metFORMIN, escitalopram, busPIRone, and pravastatin.  Meds ordered this encounter  Medications  . albuterol (PROVENTIL HFA;VENTOLIN HFA) 108 (90 Base) MCG/ACT inhaler    Sig: Inhale 1-2 puffs into the lungs every 6 (six) hours as needed.    Dispense:  1 Inhaler  Refill:  0    Order Specific Question:   Supervising Provider    Answer:   Lucille Passy [[id number]]  . HYDROcodone-homatropine (HYCODAN) 5-1.5 MG/5ML syrup    Sig: Take 5 mLs by mouth at bedtime as needed for cough.    Dispense:  60 mL    Refill:  0    Order Specific Question:   Supervising Provider    Answer:   Lucille Passy [[id number]]  . guaiFENesin (MUCINEX) 600 MG 12 hr tablet    Sig: Take 1 tablet (600 mg  total) by mouth 2 (two) times daily as needed for cough or to loosen phlegm.    Dispense:  14 tablet    Refill:  0    Order Specific Question:   Supervising Provider    Answer:   Lucille Passy [[id number]]    Follow-up: Return if symptoms worsen or fail to improve.  Wilfred Lacy, NP

## 2018-02-07 ENCOUNTER — Other Ambulatory Visit (INDEPENDENT_AMBULATORY_CARE_PROVIDER_SITE_OTHER): Payer: BLUE CROSS/BLUE SHIELD

## 2018-02-07 DIAGNOSIS — E785 Hyperlipidemia, unspecified: Secondary | ICD-10-CM

## 2018-02-07 LAB — LIPID PANEL
CHOL/HDL RATIO: 4
CHOLESTEROL: 148 mg/dL (ref 0–200)
HDL: 38.4 mg/dL — AB (ref >39.00)
LDL Cholesterol: 91 mg/dL (ref 0–99)
NonHDL: 109.64
TRIGLYCERIDES: 95 mg/dL (ref 0.0–149.0)
VLDL: 19 mg/dL (ref 0.0–40.0)

## 2018-02-07 LAB — COMPREHENSIVE METABOLIC PANEL
ALK PHOS: 96 U/L (ref 39–117)
ALT: 19 U/L (ref 0–35)
AST: 16 U/L (ref 0–37)
Albumin: 4.2 g/dL (ref 3.5–5.2)
BILIRUBIN TOTAL: 0.3 mg/dL (ref 0.2–1.2)
BUN: 13 mg/dL (ref 6–23)
CALCIUM: 9.2 mg/dL (ref 8.4–10.5)
CO2: 30 meq/L (ref 19–32)
Chloride: 101 mEq/L (ref 96–112)
Creatinine, Ser: 0.69 mg/dL (ref 0.40–1.20)
GFR: 94.29 mL/min (ref >60.00)
Glucose, Bld: 130 mg/dL — ABNORMAL HIGH (ref 70–99)
Potassium: 4 mEq/L (ref 3.5–5.1)
Sodium: 138 mEq/L (ref 135–145)
Total Protein: 7.2 g/dL (ref 6.0–8.3)

## 2018-02-08 DIAGNOSIS — L812 Freckles: Secondary | ICD-10-CM | POA: Diagnosis not present

## 2018-02-08 DIAGNOSIS — D485 Neoplasm of uncertain behavior of skin: Secondary | ICD-10-CM | POA: Diagnosis not present

## 2018-02-08 DIAGNOSIS — L820 Inflamed seborrheic keratosis: Secondary | ICD-10-CM | POA: Diagnosis not present

## 2018-02-08 DIAGNOSIS — D229 Melanocytic nevi, unspecified: Secondary | ICD-10-CM | POA: Diagnosis not present

## 2018-02-08 DIAGNOSIS — I8392 Asymptomatic varicose veins of left lower extremity: Secondary | ICD-10-CM | POA: Diagnosis not present

## 2018-02-08 DIAGNOSIS — Z1283 Encounter for screening for malignant neoplasm of skin: Secondary | ICD-10-CM | POA: Diagnosis not present

## 2018-02-08 DIAGNOSIS — D225 Melanocytic nevi of trunk: Secondary | ICD-10-CM | POA: Diagnosis not present

## 2018-02-16 ENCOUNTER — Other Ambulatory Visit: Payer: Self-pay | Admitting: Family Medicine

## 2018-02-23 ENCOUNTER — Other Ambulatory Visit: Payer: Self-pay | Admitting: Nurse Practitioner

## 2018-02-23 DIAGNOSIS — J209 Acute bronchitis, unspecified: Secondary | ICD-10-CM

## 2018-03-21 DIAGNOSIS — D485 Neoplasm of uncertain behavior of skin: Secondary | ICD-10-CM | POA: Diagnosis not present

## 2018-03-21 DIAGNOSIS — L988 Other specified disorders of the skin and subcutaneous tissue: Secondary | ICD-10-CM | POA: Diagnosis not present

## 2018-03-25 ENCOUNTER — Other Ambulatory Visit: Payer: Self-pay | Admitting: Gastroenterology

## 2018-03-27 NOTE — Progress Notes (Signed)
Subjective:   Patient ID: Hannah Rocha, female    DOB: November 24, 1965, 53 y.o.   MRN: [id number]  Hannah Rocha is a pleasant 53 y.o. year old female who presents to clinic today with Follow-up (Patient is here today for a 54-month-F/U.  Last seen for DM on 10.03.19 and A1C remained controlled at 6.3 with decreased Metformin at 54mg  1qam.  She was then started on Pravachol 10mg  1qd due to her LDL being 133.  On 11.18.19 this was redrawn and LDL is now within range at 91.  Fasting BS this am was 141.)  on 03/30/2018  HPI:  Follow up.   DM- diabetes remained controlled with decreased dose of Metformin- 500 mg daily with breakfast. She does check her FSBS twice daily, fasting FSBS have been ranging 88-143. Had one episode of hypoglycemia- not sure what her FSBS was that time- she was running around shopping for xmas and had not eaten.  Ate a snickers and felt better immediately.  Lab Results  Component Value Date   HGBA1C 6.1 (A) 03/30/2018   HGBA1C 6.1 03/30/2018   Wt Readings from Last 3 Encounters:  03/30/18 156 lb (70.8 kg)  02/02/18 150 lb (68 kg)  01/17/18 148 lb (67.1 kg)    HLD- since LDL was still not at goal despite her changes in diet and weight loss, she did agree to start pravachol 10 mg nightly in 12/2017 which has been very effective.  Lab Results  Component Value Date   CHOL 148 02/07/2018   HDL 38.40 (L) 02/07/2018   LDLCALC 91 02/07/2018   LDLDIRECT 169.0 03/09/2016   TRIG 95.0 02/07/2018   CHOLHDL 4 02/07/2018   Lab Results  Component Value Date   ALT 19 02/07/2018   AST 16 02/07/2018   ALKPHOS 96 02/07/2018   BILITOT 0.3 02/07/2018   Anxiety- taking lexapro 10 mg daily along with Buspar 30 mg every morning, 15 mg daily at lunch and 15 mg daily at bedtime.   No flowsheet data found.   Depression screen [facility] 2/9 03/30/2018 05/20/2017 04/10/2016  Decreased Interest 0 0 0  Down, Depressed, Hopeless 0 0 0  PHQ - 2 Score 0 0 0   Current  Outpatient Medications on File Prior to Visit  Medication Sig Dispense Refill  . acetaminophen (TYLENOL) 500 MG tablet Take 500 mg by mouth every 6 (six) hours as needed for mild pain.     Marland Kitchen albuterol (PROVENTIL HFA;VENTOLIN HFA) 108 (90 Base) MCG/ACT inhaler INHALE 1-2 PUFFS INTO THE LUNGS EVERY 6 (SIX) HOURS AS NEEDED. 6.7 Inhaler PRN  . B Complex Vitamins (VITAMIN B COMPLEX PO) Take 1 tablet by mouth daily.    . busPIRone (BUSPAR) 30 MG tablet TAKE 1 TABLET BY MOUTH EVERY MORNING 1/2 TAB AT LUNCH, AND 1/2 AT BEDTIME 180 tablet 1  . escitalopram (LEXAPRO) 10 MG tablet TAKE 1 TABLET BY MOUTH EVERY DAY 30 tablet 3  . flintstones complete (FLINTSTONES) 60 MG chewable tablet Chew 1 tablet by mouth daily.    Marland Kitchen glucose blood (ONETOUCH VERIO) test strip Use as instructed to test blood sugar twice daily e11.9 100 each 12  . ibuprofen (ADVIL,MOTRIN) 800 MG tablet Take 1 tablet (800 mg total) by mouth every 8 (eight) hours as needed. (Patient taking differently: Take 800 mg by mouth every 8 (eight) hours as needed for moderate pain. ) 60 tablet 12  . metFORMIN (GLUCOPHAGE) 500 MG tablet Take 1 tablet (500 mg total) by mouth daily with  breakfast. 90 tablet 3  . ONETOUCH DELICA LANCETS 62X MISC Use to test blood sugar once daily E11.9 100 each 1  . pantoprazole (PROTONIX) 40 MG tablet TAKE 1 TABLET BY MOUTH EVERY DAY 30 tablet 0  . pravastatin (PRAVACHOL) 10 MG tablet Take 1 tablet (10 mg total) by mouth daily. 30 tablet 3   No current facility-administered medications on file prior to visit.     Allergies  Allergen Reactions  . Sertraline Hcl     REACTION: vomiting, severe anxiety    Past Medical History:  Diagnosis Date  . Allergy   . Anemia    hx  . Anxiety   . Cataract    FLOATING CATARACT   . Diabetes mellitus without complication ([place])   . GERD (gastroesophageal reflux disease)   . H/O hiatal hernia   . Heart murmur    ASD repair 1999- no meds   . Hyperlipidemia   . Obesity   .  PONV (postoperative nausea and vomiting)     Past Surgical History:  Procedure Laterality Date  . ASD REPAIR  1999   [facility]  . BREAST CYST ASPIRATION Right 2013  . CLEFT LIP REPAIR    . COLONOSCOPY    . LAPAROSCOPIC ASSISTED VAGINAL HYSTERECTOMY Bilateral 04/13/2013   Procedure: LAPAROSCOPIC ASSISTED VAGINAL HYSTERECTOMY;  Surgeon: Emily Filbert, MD;  Location: [facility];  Service: Gynecology;  Laterality: Bilateral;- pt states was abdominal hysterectomy   . LAPAROSCOPIC TUBAL LIGATION  03/21/2012   Procedure: LAPAROSCOPIC TUBAL LIGATION;  Surgeon: Emily Filbert, MD;  Location: [facility];  Service: Gynecology;  Laterality: Bilateral;  . MANDIBLE FRACTURE SURGERY    . Plantar Fascitis Repair Bilateral 01/2015    Family History  Problem Relation Age of Onset  . Heart disease Mother   . Colon polyps Mother   . Diabetes Mother   . Prostate cancer Father   . Heart disease Father   . Cancer Father        prostate  . Diabetes Maternal Aunt   . Diabetes Paternal Grandmother   . Colon cancer Neg Hx   . Breast cancer Neg Hx   . Esophageal cancer Neg Hx   . Rectal cancer Neg Hx   . Stomach cancer Neg Hx     Social History   Socioeconomic History  . Marital status: Married    Spouse name: Not on file  . Number of children: 0  . Years of education: Not on file  . Highest education level: Not on file  Occupational History  . 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    Employer: [employer]  Social Needs  . Financial resource strain: Not on file  . Food insecurity:    Worry: Not on file    Inability: Not on file  . Transportation needs:    Medical: Not on file    Non-medical: Not on file  Tobacco Use  . Smoking status: Former Smoker    Packs/day: 0.00    Years: 0.00    Pack years: 0.00    Types: Cigarettes    Last attempt to quit: 02/21/1987    Years since quitting: 31.1  . Smokeless tobacco: Never Used  . Tobacco comment: 22 years ago-light smoker  Substance and Sexual Activity    . Alcohol use: Yes    Alcohol/week: 0.0 standard drinks    Comment: occasional - hard apple cider, shot  . Drug use: No  . Sexual activity: Yes    Partners: Male  Birth control/protection: Surgical    Comment: tubaligation/hysterctomy  Lifestyle  . Physical activity:    Days per week: Not on file    Minutes per session: Not on file  . Stress: Not on file  Relationships  . Social connections:    Talks on phone: Not on file    Gets together: Not on file    Attends religious service: Not on file    Active member of club or organization: Not on file    Attends meetings of clubs or organizations: Not on file    Relationship status: Not on file  . Intimate partner violence:    Fear of current or ex partner: Not on file    Emotionally abused: Not on file    Physically abused: Not on file    Forced sexual activity: Not on file  Other Topics Concern  . Not on file  Social History Narrative   Daily caffeine use   The PMH, PSH, Social History, Family History, Medications, and allergies have been reviewed in [facility], and have been updated if relevant.   Review of Systems  Constitutional: Negative.   HENT: Negative.   Eyes: Negative.   Respiratory: Negative.   Cardiovascular: Negative.   Gastrointestinal: Negative.   Endocrine: Negative.   Genitourinary: Negative.   Musculoskeletal: Negative.   Skin: Negative.   Allergic/Immunologic: Negative.   Neurological: Negative.   Hematological: Negative.   Psychiatric/Behavioral: Negative.   All other systems reviewed and are negative.      Objective:    BP 112/68 (BP Location: Left Arm, Patient Position: Sitting, Cuff Size: Normal)   Pulse 74   Temp 97.9 F (36.6 C) (Oral)   Ht 5\' 2"  (1.575 m)   Wt 156 lb (70.8 kg)   LMP 03/24/2013   SpO2 99%   BMI 28.53 kg/m    Physical Exam Vitals signs and nursing note reviewed.  Constitutional:      Appearance: Normal appearance. She is normal weight.  HENT:     Head:  Normocephalic and atraumatic.     Nose: Nose normal.     Mouth/Throat:     Mouth: Mucous membranes are moist.  Eyes:     Extraocular Movements: Extraocular movements intact.  Neck:     Musculoskeletal: Normal range of motion.  Cardiovascular:     Rate and Rhythm: Normal rate and regular rhythm.     Pulses: Normal pulses.     Heart sounds: Normal heart sounds.  Pulmonary:     Effort: Pulmonary effort is normal.     Breath sounds: Normal breath sounds.  Abdominal:     General: Abdomen is flat.  Musculoskeletal: Normal range of motion.        General: No swelling.  Skin:    General: Skin is warm.  Neurological:     General: No focal deficit present.     Mental Status: She is alert.     Cranial Nerves: No cranial nerve deficit.  Psychiatric:        Mood and Affect: Mood normal.        Thought Content: Thought content normal.        Judgment: Judgment normal.           Assessment & Plan:   Type 2 diabetes mellitus without complication, without long-term current use of insulin (HCC) - Plan: POCT HgB A1C  Hyperlipidemia, unspecified hyperlipidemia type  Anxiety No follow-ups on file.

## 2018-03-28 DIAGNOSIS — D485 Neoplasm of uncertain behavior of skin: Secondary | ICD-10-CM | POA: Diagnosis not present

## 2018-03-30 ENCOUNTER — Ambulatory Visit: Payer: BLUE CROSS/BLUE SHIELD | Admitting: Family Medicine

## 2018-03-30 ENCOUNTER — Encounter: Payer: Self-pay | Admitting: Family Medicine

## 2018-03-30 ENCOUNTER — Telehealth: Payer: Self-pay | Admitting: Gastroenterology

## 2018-03-30 VITALS — BP 112/68 | HR 74 | Temp 97.9°F | Ht 62.0 in | Wt 156.0 lb

## 2018-03-30 DIAGNOSIS — E785 Hyperlipidemia, unspecified: Secondary | ICD-10-CM | POA: Diagnosis not present

## 2018-03-30 DIAGNOSIS — F419 Anxiety disorder, unspecified: Secondary | ICD-10-CM | POA: Diagnosis not present

## 2018-03-30 DIAGNOSIS — E119 Type 2 diabetes mellitus without complications: Secondary | ICD-10-CM

## 2018-03-30 LAB — POCT GLYCOSYLATED HEMOGLOBIN (HGB A1C)
HbA1c POC (<> result, manual entry): 6.1 % (ref 4.0–5.6)
Hemoglobin A1C: 6.1 % — AB (ref 4.0–5.6)

## 2018-03-30 MED ORDER — GLUCOSE 4 G PO CHEW: 1.0000 | CHEWABLE_TABLET | ORAL | 12 refills | Status: DC | PRN

## 2018-03-30 NOTE — Patient Instructions (Signed)
Great to see you Happy Rudene Anda!!!  Doristine Devoid work!!!  Please keep me updated if you have any episodes of low blood sugars.  Come see me in 6 months.

## 2018-03-30 NOTE — Telephone Encounter (Signed)
Pt is needing medication pantoprazole (PROTONIX) 40 MG tablet [[id number]]  Refill pt is sched w/dr.stark 05/17/2018

## 2018-03-30 NOTE — Assessment & Plan Note (Signed)
Well controlled on current rxs. No changes made. 

## 2018-03-30 NOTE — Assessment & Plan Note (Signed)
Improved and at goal with pravachol 10 mg daily.  Continue current dose. The patient indicates understanding of these issues and agrees with the plan.

## 2018-03-30 NOTE — Assessment & Plan Note (Signed)
Doing really well.  A1c has improved. Follow up for CPX and medication follow up in 6 months, sooner if she has low blood sugars. Continue Metformin 500 mg daily with breakfast. If a1c continues to decrease, we will consider stopping Metformin in 6 months.

## 2018-03-31 MED ORDER — PANTOPRAZOLE SODIUM 40 MG PO TBEC: 40.0000 mg | DELAYED_RELEASE_TABLET | Freq: Every day | ORAL | 5 refills | Status: DC

## 2018-03-31 NOTE — Telephone Encounter (Signed)
Prescription sent to patient's pharmacy and patient notified to follow up in October.

## 2018-04-11 ENCOUNTER — Other Ambulatory Visit: Payer: Self-pay | Admitting: Obstetrics & Gynecology

## 2018-04-11 ENCOUNTER — Encounter: Payer: Self-pay | Admitting: Obstetrics & Gynecology

## 2018-04-11 ENCOUNTER — Ambulatory Visit (INDEPENDENT_AMBULATORY_CARE_PROVIDER_SITE_OTHER): Payer: BLUE CROSS/BLUE SHIELD | Admitting: Obstetrics & Gynecology

## 2018-04-11 VITALS — BP 137/79 | HR 64 | Ht 64.0 in | Wt 159.0 lb

## 2018-04-11 DIAGNOSIS — Z1231 Encounter for screening mammogram for malignant neoplasm of breast: Secondary | ICD-10-CM

## 2018-04-11 DIAGNOSIS — Z01419 Encounter for gynecological examination (general) (routine) without abnormal findings: Secondary | ICD-10-CM

## 2018-04-11 MED ORDER — IBUPROFEN 800 MG PO TABS: 800.0000 mg | ORAL_TABLET | Freq: Three times a day (TID) | ORAL | 6 refills | Status: DC | PRN

## 2018-04-11 NOTE — Progress Notes (Signed)
Subjective:    Cherokee Boccio is a 53 y.o. P0 female who presents for an annual exam. The patient has no complaints today. The patient is sexually active. GYN screening history: last pap: was normal. The patient wears seatbelts: yes. The patient participates in regular exercise: yes. Has the patient ever been transfused or tattooed?: no. The patient reports that there is not domestic violence in her life.   Menstrual History: OB History    Gravida  1   Para  0   Term  0   Preterm  0   AB  1   Living  0     SAB  0   TAB  1   Ectopic  0   Multiple  0   Live Births              Menarche age: 2 Patient's last menstrual period was 03/24/2013.    The following portions of the patient's history were reviewed and updated as appropriate: allergies, current medications, past family history, past medical history, past social history, past surgical history and problem list.  Review of Systems Pertinent items are noted in HPI.   Polyps found with colonoscopy 12/19, will need repeat in 5 years Monogamous for 31 years Works at an [employer] in [place] Flu vaccine and mammogram up to date   Objective:    BP 137/79   Pulse 64   Ht 5\' 4"  (1.626 m)   Wt 159 lb (72.1 kg)   LMP 03/24/2013   BMI 27.29 kg/m   General Appearance:    Alert, cooperative, no distress, appears stated age  Head:    Normocephalic, without obvious abnormality, atraumatic  Eyes:    PERRL, conjunctiva/corneas clear, EOM's intact, fundi    benign, both eyes  Ears:    Normal TM's and external ear canals, both ears  Nose:   Nares normal, septum midline, mucosa normal, no drainage    or sinus tenderness  Throat:   Lips, mucosa, and tongue normal; teeth and gums normal  Neck:   Supple, symmetrical, trachea midline, no adenopathy;    thyroid:  no enlargement/tenderness/nodules; no carotid   bruit or JVD  Back:     Symmetric, no curvature, ROM normal, no CVA tenderness  Lungs:     Clear  to auscultation bilaterally, respirations unlabored  Chest Wall:    No tenderness or deformity   Heart:    Regular rate and rhythm, S1 and S2 normal, no murmur, rub   or gallop  Breast Exam:    No tenderness, masses, or nipple abnormality  Abdomen:     Soft, non-tender, bowel sounds active all four quadrants,    no masses, no organomegaly  Genitalia:    Normal female without lesion, discharge or tenderness, mild atrophy, speculum exam normal, no masses or tenderness with bimanual     Extremities:   Extremities normal, atraumatic, no cyanosis or edema  Pulses:   2+ and symmetric all extremities  Skin:   Skin color, texture, turgor normal, no rashes or lesions  Lymph nodes:   Cervical, supraclavicular, and axillary nodes normal  Neurologic:   CNII-XII intact, normal strength, sensation and reflexes    throughout  .    Assessment:    Healthy female exam.    Plan:     Discussed healthy lifestyle modifications.

## 2018-04-11 NOTE — Progress Notes (Signed)
137 

## 2018-04-15 ENCOUNTER — Other Ambulatory Visit: Payer: Self-pay | Admitting: Family Medicine

## 2018-05-04 ENCOUNTER — Ambulatory Visit
Admission: RE | Admit: 2018-05-04 | Discharge: 2018-05-04 | Disposition: A | Discharge status: Home / Self Care | Payer: BLUE CROSS/BLUE SHIELD | Source: Ambulatory Visit | Attending: Obstetrics & Gynecology | Admitting: Obstetrics & Gynecology

## 2018-05-04 DIAGNOSIS — Z1231 Encounter for screening mammogram for malignant neoplasm of breast: Secondary | ICD-10-CM | POA: Diagnosis not present

## 2018-05-10 ENCOUNTER — Encounter: Payer: Self-pay | Admitting: Family Medicine

## 2018-05-10 ENCOUNTER — Ambulatory Visit: Payer: BLUE CROSS/BLUE SHIELD | Admitting: Family Medicine

## 2018-05-10 VITALS — BP 116/68 | HR 76 | Temp 98.5°F | Ht 64.0 in | Wt 156.8 lb

## 2018-05-10 DIAGNOSIS — B354 Tinea corporis: Secondary | ICD-10-CM

## 2018-05-10 MED ORDER — CLOTRIMAZOLE-BETAMETHASONE 1-0.05 % EX CREA: 1.0000 "application " | TOPICAL_CREAM | Freq: Two times a day (BID) | CUTANEOUS | 0 refills | Status: DC

## 2018-05-10 NOTE — Progress Notes (Signed)
Subjective:   Patient ID: Hannah Rocha, female    DOB: [birth year], 53 y.o.   MRN: [id number]  Hannah Rocha is a pleasant 53 y.o. year old female who presents to clinic today with Rash (Patient is here today C/O possible ring worm.  She has an area on left dorsal side of forearm that measures 3cm x 2cm.  An area on right wrist palmar side that measures 2cm x 2cm. The neighbor has a kitten and she has been playing with it and is wondering if that is the culprit.  Using Lamisil 3-4 weeks topically when she noticed the rash but she has forgotten some days.)  on 05/10/2018  HPI:  Rash- two spots- one her right arm and left leg.  Very itchy.  Noticed it after she played with the neighbor's cat.  Has tried topical lamisil but has not seen any improvement.  Current Outpatient Medications on File Prior to Visit  Medication Sig Dispense Refill  . acetaminophen (TYLENOL) 500 MG tablet Take 500 mg by mouth every 6 (six) hours as needed for mild pain.     Hannah Rocha albuterol (PROVENTIL HFA;VENTOLIN HFA) 108 (90 Base) MCG/ACT inhaler INHALE 1-2 PUFFS INTO THE LUNGS EVERY 6 (SIX) HOURS AS NEEDED. 6.7 Inhaler PRN  . B Complex Vitamins (VITAMIN B COMPLEX PO) Take 1 tablet by mouth daily.    . busPIRone (BUSPAR) 30 MG tablet TAKE 1 TABLET BY MOUTH EVERY MORNING 1/2 TAB AT LUNCH, AND 1/2 AT BEDTIME 180 tablet 1  . escitalopram (LEXAPRO) 10 MG tablet TAKE 1 TABLET BY MOUTH EVERY DAY 30 tablet 3  . flintstones complete (FLINTSTONES) 60 MG chewable tablet Chew 1 tablet by mouth daily.    Hannah Rocha glucose 4 GM chewable tablet Chew 1 tablet (4 g total) by mouth as needed for low blood sugar. 50 tablet 12  . glucose blood (ONETOUCH VERIO) test strip Use as instructed to test blood sugar twice daily e11.9 100 each 12  . ibuprofen (ADVIL,MOTRIN) 800 MG tablet Take 1 tablet (800 mg total) by mouth every 8 (eight) hours as needed. 60 tablet 6  . metFORMIN (GLUCOPHAGE) 500 MG tablet Take 1 tablet (500 mg total) by  mouth daily with breakfast. 90 tablet 3  . ONETOUCH DELICA LANCETS 46N MISC Use to test blood sugar once daily E11.9 100 each 1  . pantoprazole (PROTONIX) 40 MG tablet Take 1 tablet (40 mg total) by mouth daily. 30 tablet 5  . pravastatin (PRAVACHOL) 10 MG tablet TAKE 1 TABLET BY MOUTH EVERY DAY 90 tablet 3   No current facility-administered medications on file prior to visit.     Allergies  Allergen Reactions  . Sertraline Hcl     REACTION: vomiting, severe anxiety    Past Medical History:  Diagnosis Date  . Allergy   . Anemia    hx  . Anxiety   . Cataract    FLOATING CATARACT   . Diabetes mellitus without complication ([place])   . GERD (gastroesophageal reflux disease)   . H/O hiatal hernia   . Heart murmur    ASD repair 1999- no meds   . Hyperlipidemia   . Obesity   . PONV (postoperative nausea and vomiting)     Past Surgical History:  Procedure Laterality Date  . ASD REPAIR  1999   [facility]  . BREAST CYST ASPIRATION Right 2013  . CLEFT LIP REPAIR    . COLONOSCOPY    . LAPAROSCOPIC ASSISTED VAGINAL HYSTERECTOMY Bilateral 04/13/2013  Procedure: LAPAROSCOPIC ASSISTED VAGINAL HYSTERECTOMY;  Surgeon: Emily Filbert, MD;  Location: [facility];  Service: Gynecology;  Laterality: Bilateral;- pt states was abdominal hysterectomy   . LAPAROSCOPIC TUBAL LIGATION  03/21/2012   Procedure: LAPAROSCOPIC TUBAL LIGATION;  Surgeon: Emily Filbert, MD;  Location: [facility];  Service: Gynecology;  Laterality: Bilateral;  . MANDIBLE FRACTURE SURGERY    . Plantar Fascitis Repair Bilateral 01/2015    Family History  Problem Relation Age of Onset  . Heart disease Mother   . Colon polyps Mother   . Diabetes Mother   . Prostate cancer Father   . Heart disease Father   . Cancer Father        prostate  . Diabetes Maternal Aunt   . Diabetes Paternal Grandmother   . Colon cancer Neg Hx   . Breast cancer Neg Hx   . Esophageal cancer Neg Hx   . Rectal cancer Neg Hx   . Stomach cancer Neg Hx      Social History   Socioeconomic History  . Marital status: Married    Spouse name: Not on file  . Number of children: 0  . Years of education: Not on file  . Highest education level: Not on file  Occupational History  . 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    Employer: [employer]  Social Needs  . Financial resource strain: Not on file  . Food insecurity:    Worry: Not on file    Inability: Not on file  . Transportation needs:    Medical: Not on file    Non-medical: Not on file  Tobacco Use  . Smoking status: Former Smoker    Packs/day: 0.00    Years: 0.00    Pack years: 0.00    Types: Cigarettes    Last attempt to quit: 02/21/1987    Years since quitting: 31.2  . Smokeless tobacco: Never Used  . Tobacco comment: 22 years ago-light smoker  Substance and Sexual Activity  . Alcohol use: Yes    Alcohol/week: 0.0 standard drinks    Comment: occasional - hard apple cider, shot  . Drug use: No  . Sexual activity: Yes    Partners: Male    Birth control/protection: Surgical    Comment: tubaligation/hysterctomy  Lifestyle  . Physical activity:    Days per week: Not on file    Minutes per session: Not on file  . Stress: Not on file  Relationships  . Social connections:    Talks on phone: Not on file    Gets together: Not on file    Attends religious service: Not on file    Active member of club or organization: Not on file    Attends meetings of clubs or organizations: Not on file    Relationship status: Not on file  . Intimate partner violence:    Fear of current or ex partner: Not on file    Emotionally abused: Not on file    Physically abused: Not on file    Forced sexual activity: Not on file  Other Topics Concern  . Not on file  Social History Narrative   Daily caffeine use   The PMH, PSH, Social History, Family History, Medications, and allergies have been reviewed in [facility], and have been updated if relevant.    Review of Systems  Constitutional:  Negative.   Respiratory: Negative.   Cardiovascular: Negative.   Gastrointestinal: Negative.   Endocrine: Negative.   Genitourinary: Negative.   Musculoskeletal: Negative.  Skin: Positive for rash.  Neurological: Negative.   Hematological: Negative.   Psychiatric/Behavioral: Negative.   All other systems reviewed and are negative.      Objective:    BP 116/68 (BP Location: Left Arm, Patient Position: Sitting, Cuff Size: Normal)   Pulse 76   Temp 98.5 F (36.9 C) (Oral)   Ht 5\' 4"  (1.626 m)   Wt 156 lb 12.8 oz (71.1 kg)   LMP 03/24/2013   SpO2 98%   BMI 26.91 kg/m    Physical Exam Vitals signs and nursing note reviewed.  Constitutional:      General: She is not in acute distress.    Appearance: Normal appearance. She is not ill-appearing.  HENT:     Head: Normocephalic and atraumatic.     Nose: Nose normal.     Mouth/Throat:     Mouth: Mucous membranes are moist.  Cardiovascular:     Rate and Rhythm: Normal rate.  Pulmonary:     Effort: Pulmonary effort is normal.  Musculoskeletal: Normal range of motion.  Skin:    Findings: Rash present.     Comments: Two lesions- circular, nickel sized lesions- on on right wrist, other on left lower arm with raised borders and central clearing.  Neurological:     General: No focal deficit present.     Mental Status: She is alert and oriented to person, place, and time.  Psychiatric:        Mood and Affect: Mood normal.        Behavior: Behavior normal.        Thought Content: Thought content normal.        Judgment: Judgment normal.           Assessment & Plan:   No diagnosis found. No follow-ups on file.

## 2018-05-10 NOTE — Assessment & Plan Note (Signed)
New- treat with topical lotrisone twice daily, discussed supportive care. Update me in 1- 2 weeks. Call or return to clinic prn if these symptoms worsen or fail to improve as anticipated. The patient indicates understanding of these issues and agrees with the plan.

## 2018-05-10 NOTE — Patient Instructions (Signed)

## 2018-05-17 ENCOUNTER — Ambulatory Visit: Payer: BLUE CROSS/BLUE SHIELD | Admitting: Gastroenterology

## 2018-07-14 ENCOUNTER — Other Ambulatory Visit: Payer: Self-pay | Admitting: Family Medicine

## 2018-09-28 ENCOUNTER — Encounter: Payer: BLUE CROSS/BLUE SHIELD | Admitting: Family Medicine

## 2018-10-04 ENCOUNTER — Telehealth: Payer: Self-pay | Admitting: Family Medicine

## 2018-10-04 ENCOUNTER — Other Ambulatory Visit: Payer: Self-pay

## 2018-10-04 ENCOUNTER — Telehealth: Payer: Self-pay

## 2018-10-04 DIAGNOSIS — Z0000 Encounter for general adult medical examination without abnormal findings: Secondary | ICD-10-CM | POA: Insufficient documentation

## 2018-10-04 MED ORDER — BUSPIRONE HCL 30 MG PO TABS: ORAL_TABLET | ORAL | 3 refills | Status: DC

## 2018-10-04 NOTE — Telephone Encounter (Signed)
Questions for Screening COVID-19  Symptom onset: None  Travel or Contacts: None  During this illness, did/does the patient experience any of the following symptoms? Fever >100.86F []   Yes [x]   No []   Unknown Subjective fever (felt feverish) []   Yes [x]   No []   Unknown Chills []   Yes [x]   No []   Unknown Muscle aches (myalgia) []   Yes [x]   No []   Unknown Runny nose (rhinorrhea) []   Yes [x]   No []   Unknown Sore throat []   Yes [x]   No []   Unknown Cough (new onset or worsening of chronic cough) []   Yes [x]   No []   Unknown Shortness of breath (dyspnea) []   Yes [x]   No []   Unknown Nausea or vomiting []   Yes [x]   No []   Unknown Headache []   Yes [x]   No []   Unknown Abdominal pain  []   Yes [x]   No []   Unknown Diarrhea (?3 loose/looser than normal stools/24hr period) []   Yes [x]   No []   Unknown Other, specify:  Patient risk factors: Smoker? []   Current []   Former []   Never If female, currently pregnant? []   Yes []   No  Patient Active Problem List   Diagnosis Date Noted  . Well woman exam without gynecological exam 10/04/2018  . HLD (hyperlipidemia) 03/09/2016  . Diabetes ([place]) 03/09/2016  . Vitamin Eila Runyan deficiency 03/09/2016  . Varicose veins 12/28/2013  . Elevated blood pressure 04/04/2012  . Anxiety 04/04/2012  . GERD (gastroesophageal reflux disease) 01/23/2011  . 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 MENOPAUSAL SYNDROME 01/15/2010  . ANEMIA-IRON DEFICIENCY 06/21/2009  . IBS 06/21/2009  . IRON DEFICIENCY ANEMIA, HX OF 07/10/2008  . HIATAL HERNIA 07/09/2008  . Esophageal reflux 05/03/2007  . ATRIAL SEPTAL DEFECT, SECUNDUM TYPE 05/03/2007  . 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  . CARDIAC MURMUR, HX OF 05/03/2007    Plan:  []   High risk for COVID-19 with red flags go to ED (with CP, SOB, weak/lightheaded, or fever > 101.5). Call ahead.  []   High risk for COVID-19 but stable. Inform provider and coordinate time for [facility] visit.   []   No red  flags but URI signs or symptoms okay for [facility] visit.

## 2018-10-04 NOTE — Progress Notes (Signed)
Subjective:   Patient ID: Hannah Rocha, female    DOB: [birth year], 53 y.o.   MRN: [id number]  Hannah Rocha is a pleasant 53 y.o. year old female who presents to clinic today with Annual Exam (Pt screened at vehicle. She is here today for a CPE without PAP.  PAP and Mammogram completed on 1.20.20. She is currently fasting) and Follow-up  on 10/05/2018  HPI:  She is doing well- working remotely- she is enjoying working from home.  Health Maintenance  Topic Date Due   URINE MICROALBUMIN  06/24/2018   FOOT EXAM  09/23/2018   HEMOGLOBIN A1C  09/28/2018   INFLUENZA VACCINE  10/22/2018   OPHTHALMOLOGY EXAM  04/07/2019   MAMMOGRAM  05/04/2020   COLONOSCOPY  01/18/2023   TETANUS/TDAP  09/23/2027   PNEUMOCOCCAL POLYSACCHARIDE VACCINE AGE 16-64 HIGH RISK  Completed   HIV Screening  Discontinued  Colonoscopy 12/19- polyps- 5 year recall Mammogram 05/04/18. Remote h/o hysterectomy.  She has GYN- Dr. Hulan Fray.  Last saw her on 04/01/18.  Note reviewed.  Has sees dermatology (Dr. Nicole Kindred yearly)- saw her a few months ago.  I last saw pt for follow up of chronic conditions on 03/30/18.  Note reviewed.  DM- diabetes remained controlled with decreased dose of Metformin- 500 mg daily with breakfast. She does check her FSBS twice daily, fasting FSBS have been ranging 104-132. 104-  Lab Results  Component Value Date   HGBA1C 6.1 (A) 03/30/2018   HGBA1C 6.1 03/30/2018   HLD- we did start her on pravachol 10 mg daily given that her LDL was not at goal for a diabetic despite her lifestyle changes and her had a dramatic improvement in her LDL when we last checked it in 01/2018.  Lab Results  Component Value Date   CHOL 148 02/07/2018   HDL 38.40 (L) 02/07/2018   LDLCALC 91 02/07/2018   LDLDIRECT 169.0 03/09/2016   TRIG 95.0 02/07/2018   CHOLHDL 4 02/07/2018   Lab Results  Component Value Date   ALT 19 02/07/2018   AST 16 02/07/2018   ALKPHOS 96 02/07/2018   BILITOT  0.3 02/07/2018   Anxiety- taking lexapro 10 mg daily along with Buspar 30 mg every morning, 15 mg daily at lunch and 15 mg daily at bedtime.  She feels these rxs are are working.  Depression screen [facility] 2/9 03/30/2018 05/20/2017 04/10/2016  Decreased Interest 0 0 0  Down, Depressed, Hopeless 0 0 0  PHQ - 2 Score 0 0 0   History of Vit D deficiency- not currently taking any.  She has been feeling more tired lately.  No DOE or CP.  H/o iron deficiency anemia- Lab Results  Component Value Date   WBC 7.6 10/20/2017   HGB 12.6 10/20/2017   HCT 38.0 10/20/2017   MCV 92.9 10/20/2017   PLT 193 10/20/2017   Lab Results  Component Value Date   FERRITIN 76.3 10/28/2012      Current Outpatient Medications on File Prior to Visit  Medication Sig Dispense Refill   acetaminophen (TYLENOL) 500 MG tablet Take 500 mg by mouth every 6 (six) hours as needed for mild pain.      albuterol (PROVENTIL HFA;VENTOLIN HFA) 108 (90 Base) MCG/ACT inhaler INHALE 1-2 PUFFS INTO THE LUNGS EVERY 6 (SIX) HOURS AS NEEDED. 6.7 Inhaler PRN   B Complex Vitamins (VITAMIN B COMPLEX PO) Take 1 tablet by mouth daily.     busPIRone (BUSPAR) 30 MG tablet TAKE 1 TABLET BY MOUTH  EVERY MORNING 1/2 TAB AT LUNCH, AND 1/2 AT BEDTIME 180 tablet 3   escitalopram (LEXAPRO) 10 MG tablet TAKE 1 TABLET BY MOUTH EVERY DAY 30 tablet 3   flintstones complete (FLINTSTONES) 60 MG chewable tablet Chew 1 tablet by mouth daily.     glucose 4 GM chewable tablet Chew 1 tablet (4 g total) by mouth as needed for low blood sugar. 50 tablet 12   glucose blood (ONETOUCH VERIO) test strip Use as instructed to test blood sugar twice daily e11.9 100 each 12   ibuprofen (ADVIL,MOTRIN) 800 MG tablet Take 1 tablet (800 mg total) by mouth every 8 (eight) hours as needed. 60 tablet 6   metFORMIN (GLUCOPHAGE) 500 MG tablet Take 1 tablet (500 mg total) by mouth daily with breakfast. 90 tablet 3   ONETOUCH DELICA LANCETS 11H MISC Use to test blood  sugar once daily E11.9 100 each 1   pantoprazole (PROTONIX) 40 MG tablet Take 1 tablet (40 mg total) by mouth daily. 30 tablet 5   pravastatin (PRAVACHOL) 10 MG tablet TAKE 1 TABLET BY MOUTH EVERY DAY 90 tablet 3   No current facility-administered medications on file prior to visit.     Allergies  Allergen Reactions   Sertraline Hcl     REACTION: vomiting, severe anxiety    Past Medical History:  Diagnosis Date   Allergy    Anemia    hx   Anxiety    Cataract    FLOATING CATARACT    Diabetes mellitus without complication ([facility])    GERD (gastroesophageal reflux disease)    H/O hiatal hernia    Heart murmur    ASD repair 1999- no meds    Hx of dysplastic nevus    multiple sites, some severe   Hyperlipidemia    Obesity    PONV (postoperative nausea and vomiting)     Past Surgical History:  Procedure Laterality Date   ASD REPAIR  1999   [facility]   BREAST CYST ASPIRATION Right 2013   CLEFT LIP REPAIR     COLONOSCOPY     LAPAROSCOPIC ASSISTED VAGINAL HYSTERECTOMY Bilateral 04/13/2013   Procedure: LAPAROSCOPIC ASSISTED VAGINAL HYSTERECTOMY;  Surgeon: Emily Filbert, MD;  Location: [facility];  Service: Gynecology;  Laterality: Bilateral;- pt states was abdominal hysterectomy    LAPAROSCOPIC TUBAL LIGATION  03/21/2012   Procedure: LAPAROSCOPIC TUBAL LIGATION;  Surgeon: Emily Filbert, MD;  Location: [facility];  Service: Gynecology;  Laterality: Bilateral;   MANDIBLE FRACTURE SURGERY     Plantar Fascitis Repair Bilateral 01/2015    Family History  Problem Relation Age of Onset   Heart disease Mother    Colon polyps Mother    Diabetes Mother    Prostate cancer Father    Heart disease Father    Cancer Father        prostate   Diabetes Maternal Aunt    Diabetes Paternal Grandmother    Colon cancer Neg Hx    Breast cancer Neg Hx    Esophageal cancer Neg Hx    Rectal cancer Neg Hx    Stomach cancer Neg Hx     Social History   Socioeconomic History    Marital status: Married    Spouse name: Not on file   Number of children: 0   Years of education: Not on file   Highest education level: Not on file  Occupational History   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    Employer: [employer]  Financial resource strain: Not on file   Food insecurity    Worry: Not on file    Inability: Not on file   Transportation needs    Medical: Not on file    Non-medical: Not on file  Tobacco Use   Smoking status: Former Smoker    Packs/day: 0.00    Years: 0.00    Pack years: 0.00    Types: Cigarettes    Quit date: 02/21/1987    Years since quitting: 31.6   Smokeless tobacco: Never Used   Tobacco comment: 22 years ago-light smoker  Substance and Sexual Activity   Alcohol use: Yes    Alcohol/week: 0.0 standard drinks    Comment: occasional - hard apple cider, shot   Drug use: No   Sexual activity: Yes    Partners: Male    Birth control/protection: Surgical    Comment: tubaligation/hysterctomy  Lifestyle   Physical activity    Days per week: Not on file    Minutes per session: Not on file   Stress: Not on file  Relationships   Social connections    Talks on phone: Not on file    Gets together: Not on file    Attends religious service: Not on file    Active member of club or organization: Not on file    Attends meetings of clubs or organizations: Not on file    Relationship status: Not on file   Intimate partner violence    Fear of current or ex partner: Not on file    Emotionally abused: Not on file    Physically abused: Not on file    Forced sexual activity: Not on file  Other Topics Concern   Not on file  Social History Narrative   Daily caffeine use   The PMH, PSH, Social History, Family History, Medications, and allergies have been reviewed in [facility], and have been updated if relevant.   Review of Systems  Constitutional: Positive for fatigue.  HENT: Negative.   Respiratory: Negative.     Cardiovascular: Negative.   Gastrointestinal: Negative.   Endocrine: Negative.   Musculoskeletal: Negative.   Allergic/Immunologic: Negative.   Neurological: Negative.   Hematological: Negative.   Psychiatric/Behavioral: Negative.        Objective:    BP 126/82 (BP Location: Left Arm, Patient Position: Sitting, Cuff Size: Normal)    Pulse 71    Temp 98.6 F (37 C) (Oral)    Ht 5\' 5"  (1.651 m)    Wt 168 lb 3.2 oz (76.3 kg)    LMP 03/24/2013    SpO2 98%    BMI 27.99 kg/m   Wt Readings from Last 3 Encounters:  10/05/18 168 lb 3.2 oz (76.3 kg)  05/10/18 156 lb 12.8 oz (71.1 kg)  04/11/18 159 lb (72.1 kg)    Physical Exam   General:  Well-developed,well-nourished,in no acute distress; alert,appropriate and cooperative throughout examination Head:  normocephalic and atraumatic.   Eyes:  vision grossly intact, PERRL Ears:  R ear normal and L ear normal externally, TMs clear bilaterally Nose:  no external deformity.   Mouth:  good dentition.   Neck:  No deformities, masses, or tenderness noted. Lungs:  Normal respiratory effort, chest expands symmetrically. Lungs are clear to auscultation, no crackles or wheezes. Heart:  Normal rate and regular rhythm. S1 and S2 normal without gallop, murmur, click, rub or other extra sounds. Abdomen:  Bowel sounds positive,abdomen soft and non-tender without masses, organomegaly or hernias noted. Msk:  No  deformity or scoliosis noted of thoracic or lumbar spine.   Extremities:  No clubbing, cyanosis, edema, or deformity noted with normal full range of motion of all joints.   Neurologic:  alert & oriented X3 and gait normal.   Skin:  Intact without suspicious lesions or rashes Cervical Nodes:  No lymphadenopathy noted Axillary Nodes:  No palpable lymphadenopathy Psych:  Cognition and judgment appear intact. Alert and cooperative with normal attention span and concentration. No apparent delusions, illusions, hallucinations

## 2018-10-04 NOTE — Telephone Encounter (Signed)
Medication:  busPIRone (BUSPAR) 30 MG tablet    Preferred Pharmacy (with phone number or street name):  [id number] - [place], [place] [phone] (Phone) [phone] (Fax)

## 2018-10-05 ENCOUNTER — Encounter: Payer: Self-pay | Admitting: Family Medicine

## 2018-10-05 ENCOUNTER — Ambulatory Visit (INDEPENDENT_AMBULATORY_CARE_PROVIDER_SITE_OTHER): Payer: BC Managed Care – PPO | Admitting: Family Medicine

## 2018-10-05 VITALS — BP 126/82 | HR 71 | Temp 98.6°F | Ht 65.0 in | Wt 168.2 lb

## 2018-10-05 DIAGNOSIS — Z862 Personal history of diseases of the blood and blood-forming organs and certain disorders involving the immune mechanism: Secondary | ICD-10-CM | POA: Diagnosis not present

## 2018-10-05 DIAGNOSIS — Z0000 Encounter for general adult medical examination without abnormal findings: Secondary | ICD-10-CM | POA: Diagnosis not present

## 2018-10-05 DIAGNOSIS — F419 Anxiety disorder, unspecified: Secondary | ICD-10-CM | POA: Diagnosis not present

## 2018-10-05 DIAGNOSIS — E119 Type 2 diabetes mellitus without complications: Secondary | ICD-10-CM | POA: Diagnosis not present

## 2018-10-05 DIAGNOSIS — E785 Hyperlipidemia, unspecified: Secondary | ICD-10-CM

## 2018-10-05 DIAGNOSIS — E559 Vitamin D deficiency, unspecified: Secondary | ICD-10-CM

## 2018-10-05 DIAGNOSIS — R5383 Other fatigue: Secondary | ICD-10-CM

## 2018-10-05 LAB — MICROALBUMIN / CREATININE URINE RATIO
Creatinine,U: 53 mg/dL
Microalb Creat Ratio: 1.3 mg/g (ref 0.0–30.0)
Microalb, Ur: <0.7 mg/dL (ref 0.0–1.9)

## 2018-10-05 LAB — LIPID PANEL
Cholesterol: 180 mg/dL (ref 0–200)
HDL: 45.7 mg/dL (ref >39.00)
LDL Cholesterol: 120 mg/dL — ABNORMAL HIGH (ref 0–99)
NonHDL: 134.2
Total CHOL/HDL Ratio: 4
Triglycerides: 72 mg/dL (ref 0.0–149.0)
VLDL: 14.4 mg/dL (ref 0.0–40.0)

## 2018-10-05 LAB — COMPREHENSIVE METABOLIC PANEL
ALT: 40 U/L — ABNORMAL HIGH (ref 0–35)
AST: 24 U/L (ref 0–37)
Albumin: 4.4 g/dL (ref 3.5–5.2)
Alkaline Phosphatase: 77 U/L (ref 39–117)
BUN: 19 mg/dL (ref 6–23)
CO2: 28 mEq/L (ref 19–32)
Calcium: 9 mg/dL (ref 8.4–10.5)
Chloride: 103 mEq/L (ref 96–112)
Creatinine, Ser: 0.71 mg/dL (ref 0.40–1.20)
GFR: 85.63 mL/min (ref >60.00)
Glucose, Bld: 106 mg/dL — ABNORMAL HIGH (ref 70–99)
Potassium: 4 mEq/L (ref 3.5–5.1)
Sodium: 139 mEq/L (ref 135–145)
Total Bilirubin: 0.4 mg/dL (ref 0.2–1.2)
Total Protein: 6.9 g/dL (ref 6.0–8.3)

## 2018-10-05 LAB — CBC WITH DIFFERENTIAL/PLATELET
Basophils Absolute: 0 10*3/uL (ref 0.0–0.1)
Basophils Relative: 0.5 % (ref 0.0–3.0)
Eosinophils Absolute: 0.1 10*3/uL (ref 0.0–0.7)
Eosinophils Relative: 1.8 % (ref 0.0–5.0)
HCT: 36.7 % (ref 36.0–46.0)
Hemoglobin: 12.3 g/dL (ref 12.0–15.0)
Lymphocytes Relative: 31.3 % (ref 12.0–46.0)
Lymphs Abs: 1.6 10*3/uL (ref 0.7–4.0)
MCHC: 33.5 g/dL (ref 30.0–36.0)
MCV: 93.4 fl (ref 78.0–100.0)
Monocytes Absolute: 0.6 10*3/uL (ref 0.1–1.0)
Monocytes Relative: 11.4 % (ref 3.0–12.0)
Neutro Abs: 2.8 10*3/uL (ref 1.4–7.7)
Neutrophils Relative %: 55 % (ref 43.0–77.0)
Platelets: 207 10*3/uL (ref 150.0–400.0)
RBC: 3.93 Mil/uL (ref 3.87–5.11)
RDW: 12.8 % (ref 11.5–15.5)
WBC: 5.1 10*3/uL (ref 4.0–10.5)

## 2018-10-05 LAB — TSH: TSH: 2.98 u[IU]/mL (ref 0.35–4.50)

## 2018-10-05 LAB — VITAMIN D 25 HYDROXY (VIT D DEFICIENCY, FRACTURES): VITD: 33.78 ng/mL (ref 30.00–100.00)

## 2018-10-05 LAB — FERRITIN: Ferritin: 39.2 ng/mL (ref 10.0–291.0)

## 2018-10-05 LAB — VITAMIN B12: Vitamin B-12: 484 pg/mL (ref 211–911)

## 2018-10-05 LAB — HEMOGLOBIN A1C: Hgb A1c MFr Bld: 6.8 % — ABNORMAL HIGH (ref 4.6–6.5)

## 2018-10-05 NOTE — Patient Instructions (Addendum)
Great to see you. I will call you with your lab results from today and you can view them online.   

## 2018-10-05 NOTE — Telephone Encounter (Signed)
Med refilled 10/04/2018

## 2018-10-05 NOTE — Assessment & Plan Note (Signed)
Reviewed preventive care protocols, scheduled due services, and updated immunizations Discussed nutrition, exercise, diet, and healthy lifestyle.  

## 2018-10-05 NOTE — Assessment & Plan Note (Signed)
Pleased with current rxs.  No changes made.

## 2018-10-05 NOTE — Assessment & Plan Note (Signed)
Increased fatigue- will check Vit D today.

## 2018-10-05 NOTE — Assessment & Plan Note (Signed)
Remains controlled based on her home FSBS.  Will check labs today. No changes made to Metformin dose. The patient indicates understanding of these issues and agrees with the plan. Orders Placed This Encounter  Procedures  . Microalbumin / creatinine urine ratio  . Lipid panel  . Hemoglobin A1c  . TSH  . CBC with Differential/Platelet  . Ferritin  . Vitamin D (25 hydroxy)  . Comprehensive metabolic panel  . B12

## 2018-10-05 NOTE — Assessment & Plan Note (Signed)
Had a great improvement in LDL with low dose pravachol. Recheck lipid panel today. No changes made to rxs.

## 2018-10-12 ENCOUNTER — Encounter: Payer: Self-pay | Admitting: Family Medicine

## 2018-10-13 ENCOUNTER — Encounter: Payer: Self-pay | Admitting: Family Medicine

## 2018-10-13 ENCOUNTER — Ambulatory Visit (INDEPENDENT_AMBULATORY_CARE_PROVIDER_SITE_OTHER): Payer: BC Managed Care – PPO | Admitting: Family Medicine

## 2018-10-13 DIAGNOSIS — L237 Allergic contact dermatitis due to plants, except food: Secondary | ICD-10-CM | POA: Diagnosis not present

## 2018-10-13 HISTORY — DX: Allergic contact dermatitis due to plants, except food: L23.7

## 2018-10-13 MED ORDER — PREDNISONE 20 MG PO TABS: ORAL_TABLET | ORAL | 0 refills | Status: DC

## 2018-10-13 NOTE — Patient Instructions (Signed)
Poison Ivy Dermatitis Poison ivy dermatitis is inflammation of the skin that is caused by chemicals in the leaves of the poison ivy plant. The skin reaction often involves redness, swelling, blisters, and extreme itching. What are the causes? This condition is caused by a chemical (urushiol) found in the sap of the poison ivy plant. This chemical is sticky and can be easily spread to people, animals, and objects. You can get poison ivy dermatitis by:  Having direct contact with a poison ivy plant.  Touching animals, other people, or objects that have come in contact with poison ivy and have the chemical on them. What increases the risk? This condition is more likely to develop in people who:  Are outdoors often in wooded or marshy areas.  Go outdoors without wearing protective clothing, such as closed shoes, long pants, and a long-sleeved shirt. What are the signs or symptoms? Symptoms of this condition include:  Redness of the skin.  Extreme itching.  A rash that often includes bumps and blisters. The rash usually appears 48 hours after exposure, if you have been exposed before. If this is the first time you have been exposed, the rash may not appear until a week after exposure.  Swelling. This may occur if the reaction is more severe. Symptoms usually last for 1-2 weeks. However, the first time you develop this condition, symptoms may last 3-4 weeks. How is this diagnosed? This condition may be diagnosed based on your symptoms and a physical exam. Your health care provider may also ask you about any recent outdoor activity. How is this treated? Treatment for this condition will vary depending on how severe it is. Treatment may include:  Hydrocortisone cream or calamine lotion to relieve itching.  Oatmeal baths to soothe the skin.  Medicines, such as over-the-counter antihistamine tablets.  Oral steroid medicine, for more severe reactions. Follow these instructions at home:  Medicines  Take or apply over-the-counter and prescription medicines only as told by your health care provider.  Use hydrocortisone cream or calamine lotion as needed to soothe the skin and relieve itching. General instructions  Do not scratch or rub your skin.  Apply a cold, wet cloth (cold compress) to the affected areas or take baths in cool water. This will help with itching. Avoid hot baths and showers.  Take oatmeal baths as needed. Use colloidal oatmeal. You can get this at your local pharmacy or grocery store. Follow the instructions on the packaging.  While you have the rash, wash clothes right after you wear them.  Keep all follow-up visits as told by your health care provider. This is important. How is this prevented?   Learn to identify the poison ivy plant and avoid contact with the plant. This plant can be recognized by the number of leaves. Generally, poison ivy has three leaves with flowering branches on a single stem. The leaves are typically glossy, and they have jagged edges that come to a point at the front.  If you have been exposed to poison ivy, thoroughly wash with soap and water right away. You have about 30 minutes to remove the plant resin before it will cause the rash. Be sure to wash under your fingernails, because any plant resin there will continue to spread the rash.  When hiking or camping, wear clothes that will help you to avoid exposure on the skin. This includes long pants, a long-sleeved shirt, tall socks, and hiking boots. You can also apply preventive lotion to your skin to   help limit exposure.  If you suspect that your clothes or outdoor gear came in contact with poison ivy, rinse them off outside with a garden hose before you bring them inside your house.  When doing yard work or gardening, wear gloves, long sleeves, long pants, and boots. Wash your garden tools and gloves if they come in contact with poison ivy.  If you suspect that your pet  has come into contact with poison ivy, wash him or her with pet shampoo and water. Make sure to wear gloves while washing your pet. Contact a health care provider if you have:  Open sores in the rash area.  More redness, swelling, or pain in the affected area.  Redness that spreads beyond the rash area.  Fluid, blood, or pus coming from the affected area.  A fever.  A rash over a large area of your body.  A rash on your eyes, mouth, or genitals.  A rash that does not improve after a few weeks. Get help right away if:  Your face swells or your eyes swell shut.  You have trouble breathing.  You have trouble swallowing. These symptoms may represent a serious problem that is an emergency. Do not wait to see if the symptoms will go away. Get medical help right away. Call your local emergency services (911 in the [place] Do not drive yourself to the hospital. Summary  Poison ivy dermatitis is inflammation of the skin that is caused by chemicals in the leaves of the poison ivy plant.  Symptoms of this condition include redness, itching, a rash, and swelling.  Do not scratch or rub your skin.  Take or apply over-the-counter and prescription medicines only as told by your health care provider. This information is not intended to replace advice given to you by your health care provider. Make sure you discuss any questions you have with your health care provider. Document Released: 03/06/2000 Document Revised: 07/01/2018 Document Reviewed: 03/04/2018 Elsevier Patient Education  2020 [employer] Inc.  

## 2018-10-13 NOTE — Assessment & Plan Note (Addendum)
New- and diffuse- will treat with slow prednisone burst- . Hannah Rocha was seen today for dermatitis.    Other orders -     predniSONE (DELTASONE) 20 MG tablet; 3 tabs by mouth qam x 3 days, 2 tabs by mouth every morning for 3 days, then 1 tab by mouth qam for 3 days, then 1/2 tab by mouth qam for 3 days  Supportive care as outlined in AVS.  Advised to wash, bedding, shoes, everything she could have been in contact with.  The patient indicates understanding of these issues and agrees with the plan. Call or send my chart message prn if these symptoms worsen or fail to improve as anticipated.

## 2018-10-13 NOTE — Progress Notes (Signed)
Virtual Visit via Video   Due to the COVID-19 pandemic, this visit was completed with telemedicine (audio/video) technology to reduce patient and provider exposure as well as to preserve personal protective equipment.   I connected with Hannah Rocha by a video enabled telemedicine application and verified that I am speaking with the correct person using two identifiers. Location patient: Home Location provider: [facility], Office Persons participating in the virtual visit: Briscoe Deutscher, MD   I discussed the limitations of evaluation and management by telemedicine and the availability of in person appointments. The patient expressed understanding and agreed to proceed.  Care Team   Patient Care Team: Lucille Passy, MD as PCP - General (Family Medicine)  Subjective:   HPI:   ?poison Ivy- Pt was exposed to poison oak over the weekend while helping husband cutting up a tree that fell.   Very itchy rash started on her shoulder, then moved to the inner side of right calf, right leg above knee and inside of right leg. Not in eyes or webbing of fingers. Tried Ivarest OTC but ineffective.  Very itchy. Not taking any histamines.  Has no pets.  Washed the clothes she wore that day but not the bedding.   Current Outpatient Medications on File Prior to Visit  Medication Sig Dispense Refill  . acetaminophen (TYLENOL) 500 MG tablet Take 500 mg by mouth every 6 (six) hours as needed for mild pain.     Marland Kitchen albuterol (PROVENTIL HFA;VENTOLIN HFA) 108 (90 Base) MCG/ACT inhaler INHALE 1-2 PUFFS INTO THE LUNGS EVERY 6 (SIX) HOURS AS NEEDED. 6.7 Inhaler PRN  . B Complex Vitamins (VITAMIN B COMPLEX PO) Take 1 tablet by mouth daily.    . busPIRone (BUSPAR) 30 MG tablet TAKE 1 TABLET BY MOUTH EVERY MORNING 1/2 TAB AT LUNCH, AND 1/2 AT BEDTIME 180 tablet 3  . escitalopram (LEXAPRO) 10 MG tablet TAKE 1 TABLET BY MOUTH EVERY DAY 30 tablet 3  . flintstones complete  (FLINTSTONES) 60 MG chewable tablet Chew 1 tablet by mouth daily.    Marland Kitchen glucose 4 GM chewable tablet Chew 1 tablet (4 g total) by mouth as needed for low blood sugar. 50 tablet 12  . glucose blood (ONETOUCH VERIO) test strip Use as instructed to test blood sugar twice daily e11.9 100 each 12  . ibuprofen (ADVIL,MOTRIN) 800 MG tablet Take 1 tablet (800 mg total) by mouth every 8 (eight) hours as needed. 60 tablet 6  . metFORMIN (GLUCOPHAGE) 500 MG tablet Take 1 tablet (500 mg total) by mouth daily with breakfast. 90 tablet 3  . ONETOUCH DELICA LANCETS 09T MISC Use to test blood sugar once daily E11.9 100 each 1  . pantoprazole (PROTONIX) 40 MG tablet Take 1 tablet (40 mg total) by mouth daily. 30 tablet 5  . pravastatin (PRAVACHOL) 10 MG tablet TAKE 1 TABLET BY MOUTH EVERY DAY 90 tablet 3   No current facility-administered medications on file prior to visit.     Allergies  Allergen Reactions  . Sertraline Hcl     REACTION: vomiting, severe anxiety    Past Medical History:  Diagnosis Date  . Allergy   . Anemia    hx  . Anxiety   . Cataract    FLOATING CATARACT   . Diabetes mellitus without complication ([place])   . GERD (gastroesophageal reflux disease)   . H/O hiatal hernia   . Heart murmur    ASD repair 1999- no meds   .  Hx of dysplastic nevus    multiple sites, some severe  . Hyperlipidemia   . Obesity   . PONV (postoperative nausea and vomiting)     Past Surgical History:  Procedure Laterality Date  . ASD REPAIR  1999   [facility]  . BREAST CYST ASPIRATION Right 2013  . CLEFT LIP REPAIR    . COLONOSCOPY    . LAPAROSCOPIC ASSISTED VAGINAL HYSTERECTOMY Bilateral 04/13/2013   Procedure: LAPAROSCOPIC ASSISTED VAGINAL HYSTERECTOMY;  Surgeon: Emily Filbert, MD;  Location: [facility];  Service: Gynecology;  Laterality: Bilateral;- pt states was abdominal hysterectomy   . LAPAROSCOPIC TUBAL LIGATION  03/21/2012   Procedure: LAPAROSCOPIC TUBAL LIGATION;  Surgeon: Emily Filbert, MD;  Location:  [facility];  Service: Gynecology;  Laterality: Bilateral;  . MANDIBLE FRACTURE SURGERY    . Plantar Fascitis Repair Bilateral 01/2015    Family History  Problem Relation Age of Onset  . Heart disease Mother   . Colon polyps Mother   . Diabetes Mother   . Prostate cancer Father   . Heart disease Father   . Cancer Father        prostate  . Diabetes Maternal Aunt   . Diabetes Paternal Grandmother   . Colon cancer Neg Hx   . Breast cancer Neg Hx   . Esophageal cancer Neg Hx   . Rectal cancer Neg Hx   . Stomach cancer Neg Hx     Social History   Socioeconomic History  . Marital status: Married    Spouse name: Not on file  . Number of children: 0  . Years of education: Not on file  . Highest education level: Not on file  Occupational History  . 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    Employer: [employer]  Social Needs  . Financial resource strain: Not on file  . Food insecurity    Worry: Not on file    Inability: Not on file  . Transportation needs    Medical: Not on file    Non-medical: Not on file  Tobacco Use  . Smoking status: Former Smoker    Packs/day: 0.00    Years: 0.00    Pack years: 0.00    Types: Cigarettes    Quit date: 02/21/1987    Years since quitting: 31.6  . Smokeless tobacco: Never Used  . Tobacco comment: 22 years ago-light smoker  Substance and Sexual Activity  . Alcohol use: Yes    Alcohol/week: 0.0 standard drinks    Comment: occasional - hard apple cider, shot  . Drug use: No  . Sexual activity: Yes    Partners: Male    Birth control/protection: Surgical    Comment: tubaligation/hysterctomy  Lifestyle  . Physical activity    Days per week: Not on file    Minutes per session: Not on file  . Stress: Not on file  Relationships  . Social Herbalist on phone: Not on file    Gets together: Not on file    Attends religious service: Not on file    Active member of club or organization: Not on file    Attends meetings of clubs  or organizations: Not on file    Relationship status: Not on file  . Intimate partner violence    Fear of current or ex partner: Not on file    Emotionally abused: Not on file    Physically abused: Not on file    Forced sexual activity: Not on file  Other  Topics Concern  . Not on file  Social History Narrative   Daily caffeine use   The PMH, PSH, Social History, Family History, Medications, and allergies have been reviewed in [facility], and have been updated if relevant.  Review of Systems  Constitutional: Negative for fever.  Skin: Positive for itching and rash.  Neurological: Negative.   Endo/Heme/Allergies: Negative.   Psychiatric/Behavioral: Negative.   All other systems reviewed and are negative.    Patient Active Problem List   Diagnosis Date Noted  . Plant allergic contact dermatitis 10/13/2018  . Well woman exam without gynecological exam 10/04/2018  . HLD (hyperlipidemia) 03/09/2016  . Diabetes ([place]) 03/09/2016  . Vitamin D deficiency 03/09/2016  . Varicose veins 12/28/2013  . Elevated blood pressure 04/04/2012  . Anxiety 04/04/2012  . GERD (gastroesophageal reflux disease) 01/23/2011  . 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 MENOPAUSAL SYNDROME 01/15/2010  . ANEMIA-IRON DEFICIENCY 06/21/2009  . IBS 06/21/2009  . IRON DEFICIENCY ANEMIA, HX OF 07/10/2008  . HIATAL HERNIA 07/09/2008  . Esophageal reflux 05/03/2007  . ATRIAL SEPTAL DEFECT, SECUNDUM TYPE 05/03/2007  . 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  . CARDIAC MURMUR, HX OF 05/03/2007    Social History   Tobacco Use  . Smoking status: Former Smoker    Packs/day: 0.00    Years: 0.00    Pack years: 0.00    Types: Cigarettes    Quit date: 02/21/1987    Years since quitting: 31.6  . Smokeless tobacco: Never Used  . Tobacco comment: 22 years ago-light smoker  Substance Use Topics  . Alcohol use: Yes    Alcohol/week: 0.0 standard drinks    Comment: occasional - hard apple  cider, shot    Current Outpatient Medications:  .  acetaminophen (TYLENOL) 500 MG tablet, Take 500 mg by mouth every 6 (six) hours as needed for mild pain. , Disp: , Rfl:  .  albuterol (PROVENTIL HFA;VENTOLIN HFA) 108 (90 Base) MCG/ACT inhaler, INHALE 1-2 PUFFS INTO THE LUNGS EVERY 6 (SIX) HOURS AS NEEDED., Disp: 6.7 Inhaler, Rfl: PRN .  B Complex Vitamins (VITAMIN B COMPLEX PO), Take 1 tablet by mouth daily., Disp: , Rfl:  .  busPIRone (BUSPAR) 30 MG tablet, TAKE 1 TABLET BY MOUTH EVERY MORNING 1/2 TAB AT LUNCH, AND 1/2 AT BEDTIME, Disp: 180 tablet, Rfl: 3 .  escitalopram (LEXAPRO) 10 MG tablet, TAKE 1 TABLET BY MOUTH EVERY DAY, Disp: 30 tablet, Rfl: 3 .  flintstones complete (FLINTSTONES) 60 MG chewable tablet, Chew 1 tablet by mouth daily., Disp: , Rfl:  .  glucose 4 GM chewable tablet, Chew 1 tablet (4 g total) by mouth as needed for low blood sugar., Disp: 50 tablet, Rfl: 12 .  glucose blood (ONETOUCH VERIO) test strip, Use as instructed to test blood sugar twice daily e11.9, Disp: 100 each, Rfl: 12 .  ibuprofen (ADVIL,MOTRIN) 800 MG tablet, Take 1 tablet (800 mg total) by mouth every 8 (eight) hours as needed., Disp: 60 tablet, Rfl: 6 .  metFORMIN (GLUCOPHAGE) 500 MG tablet, Take 1 tablet (500 mg total) by mouth daily with breakfast., Disp: 90 tablet, Rfl: 3 .  ONETOUCH DELICA LANCETS 33L MISC, Use to test blood sugar once daily E11.9, Disp: 100 each, Rfl: 1 .  pantoprazole (PROTONIX) 40 MG tablet, Take 1 tablet (40 mg total) by mouth daily., Disp: 30 tablet, Rfl: 5 .  pravastatin (PRAVACHOL) 10 MG tablet, TAKE 1 TABLET BY MOUTH EVERY DAY, Disp: 90 tablet, Rfl: 3 .  predniSONE (DELTASONE) 20 MG tablet, 3 tabs by mouth qam x 3 days, 2 tabs by mouth every morning for 3 days, then 1 tab by mouth qam for 3 days, then 1/2 tab by mouth qam for 3 days, Disp: 22 tablet, Rfl: 0  Allergies  Allergen Reactions  . Sertraline Hcl     REACTION: vomiting, severe anxiety    Objective:   VITALS: Per  patient if applicable, see vitals. GENERAL: Alert, appears well and in no acute distress. HEENT: Atraumatic, conjunctiva clear, no obvious abnormalities on inspection of external nose and ears. NECK: Normal movements of the head and neck. CARDIOPULMONARY: No increased WOB. Speaking in clear sentences. I:E ratio WNL.  MS: Moves all visible extremities without noticeable abnormality. PSYCH: Pleasant and cooperative, well-groomed. Speech normal rate and rhythm. Affect is appropriate. Insight and judgement are appropriate. Attention is focused, linear, and appropriate.  NEURO: CN grossly intact. Oriented as arrived to appointment on time with no prompting. Moves both UE equally.  SKIN: classic( for contact dermatitis) linear raised/some pustular maculular rash on knee and wrist, no obvious weeping or erythema.  Depression screen [facility] 2/9 03/30/2018 05/20/2017 04/10/2016  Decreased Interest 0 0 0  Down, Depressed, Hopeless 0 0 0  PHQ - 2 Score 0 0 0    Assessment and Plan:   Hannah Rocha was seen today for dermatitis.  Diagnoses and all orders for this visit:  Plant allergic contact dermatitis  Other orders -     predniSONE (DELTASONE) 20 MG tablet; 3 tabs by mouth qam x 3 days, 2 tabs by mouth every morning for 3 days, then 1 tab by mouth qam for 3 days, then 1/2 tab by mouth qam for 3 days    . COVID-19 Education: The signs and symptoms of COVID-19 were discussed with the patient and how to seek care for testing if needed. The importance of social distancing was discussed today. . Reviewed expectations re: course of current medical issues. . Discussed self-management of symptoms. . Outlined signs and symptoms indicating need for more acute intervention. . Patient verbalized understanding and all questions were answered. Marland Kitchen Health Maintenance issues including appropriate healthy diet, exercise, and smoking avoidance were discussed with patient. . See orders for this visit as documented in the  electronic medical record.  Arnette Norris, MD  Records requested if needed. Time spent: 15 minutes, of which >50% was spent in obtaining information about her symptoms, reviewing her previous labs, evaluations, and treatments, counseling her about her condition (please see the discussed topics above), and developing a plan to further investigate it; she had a number of questions which I addressed.

## 2018-10-27 ENCOUNTER — Other Ambulatory Visit: Payer: Self-pay

## 2018-10-27 MED ORDER — PANTOPRAZOLE SODIUM 40 MG PO TBEC: 40.0000 mg | DELAYED_RELEASE_TABLET | Freq: Every day | ORAL | 1 refills | Status: DC

## 2018-11-02 ENCOUNTER — Other Ambulatory Visit: Payer: Self-pay | Admitting: Family Medicine

## 2018-11-24 ENCOUNTER — Encounter: Payer: Self-pay | Admitting: Family Medicine

## 2018-12-01 ENCOUNTER — Telehealth: Payer: Self-pay | Admitting: Family Medicine

## 2018-12-01 DIAGNOSIS — E119 Type 2 diabetes mellitus without complications: Secondary | ICD-10-CM

## 2018-12-01 IMAGING — CR DG CHEST 2V
2 series · 2 of 2 positions shown · non-contrast
Comparison: 03/19/2017 chest radiograph.

CLINICAL DATA: Chest pain

EXAM:
CHEST  2 VIEW

[chest pa]
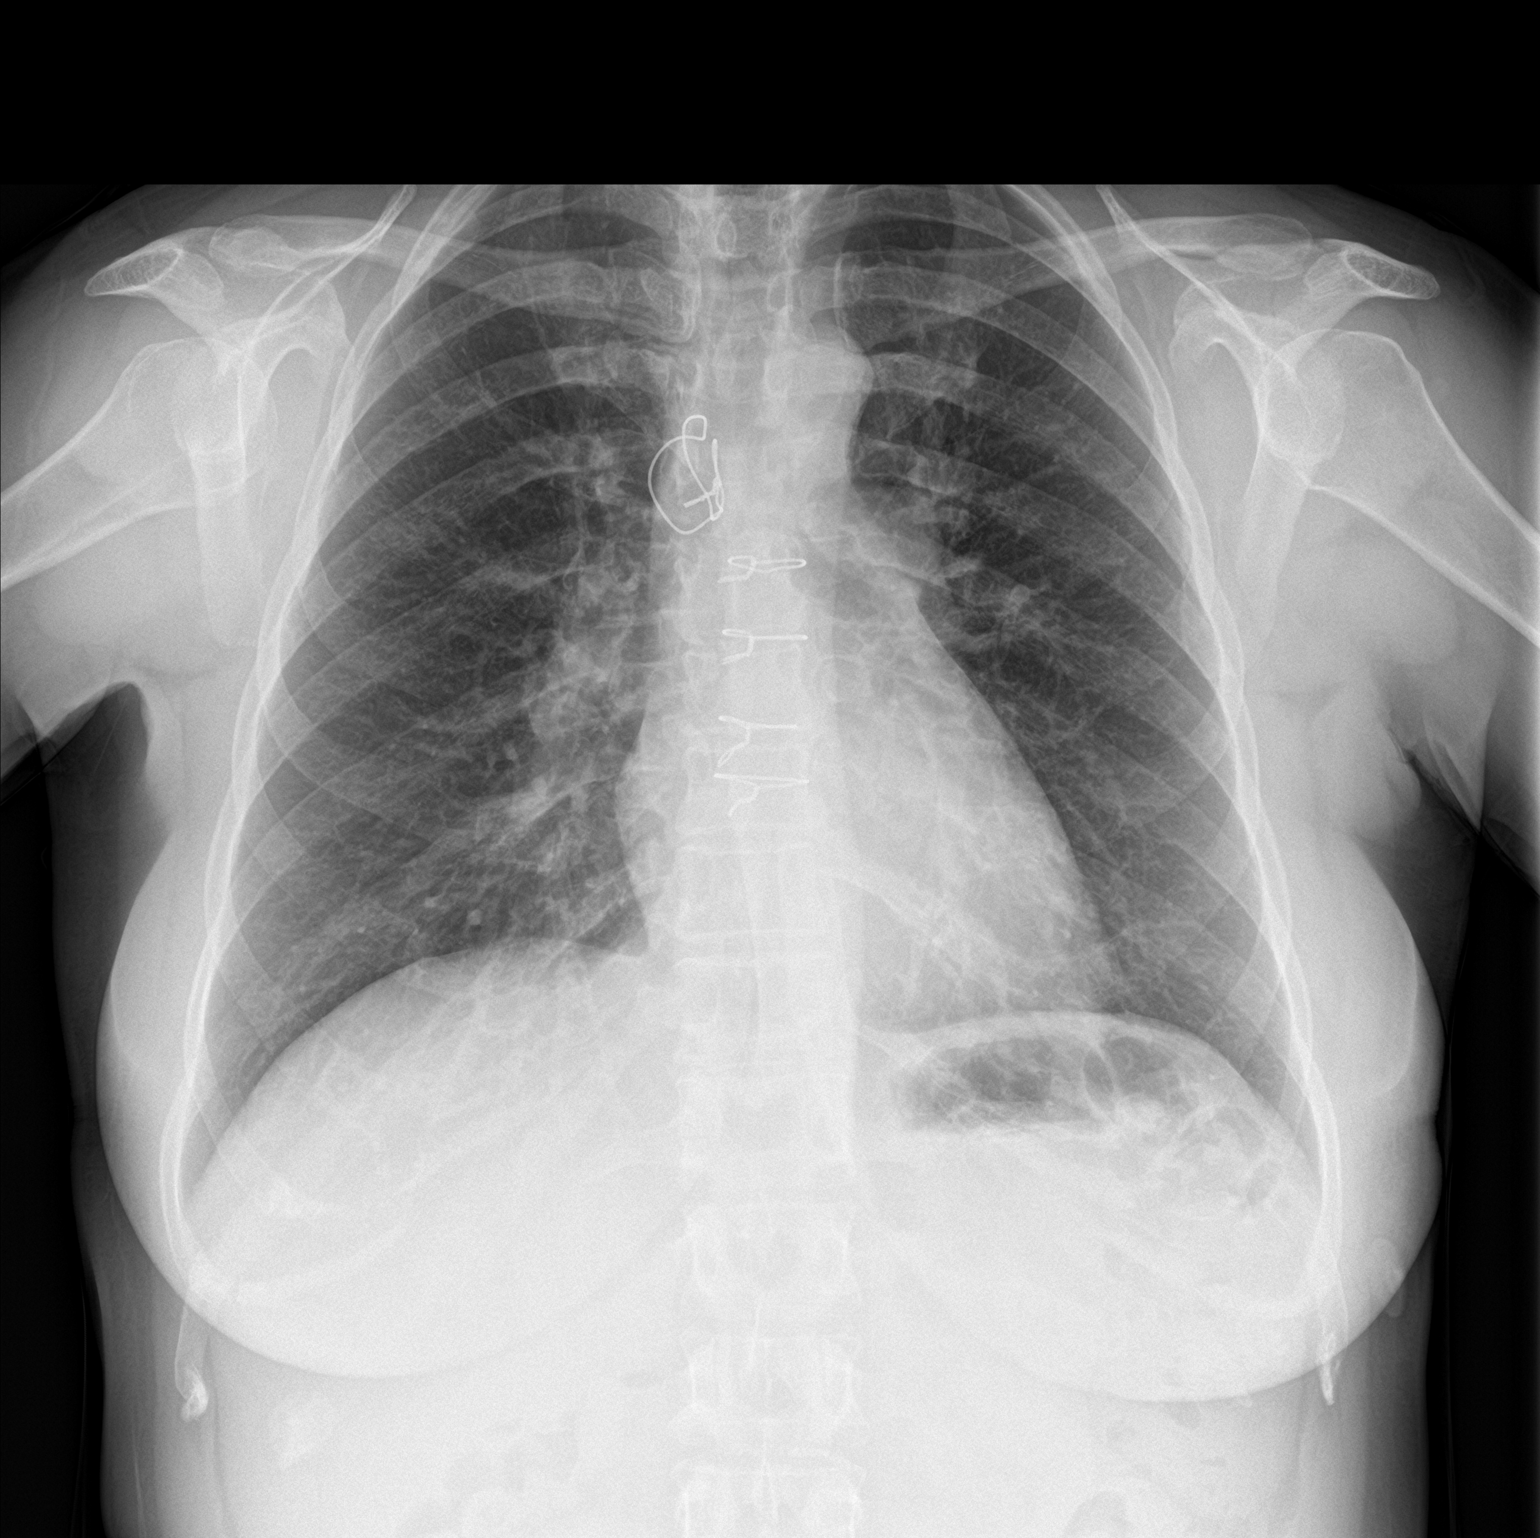

[chest lat]
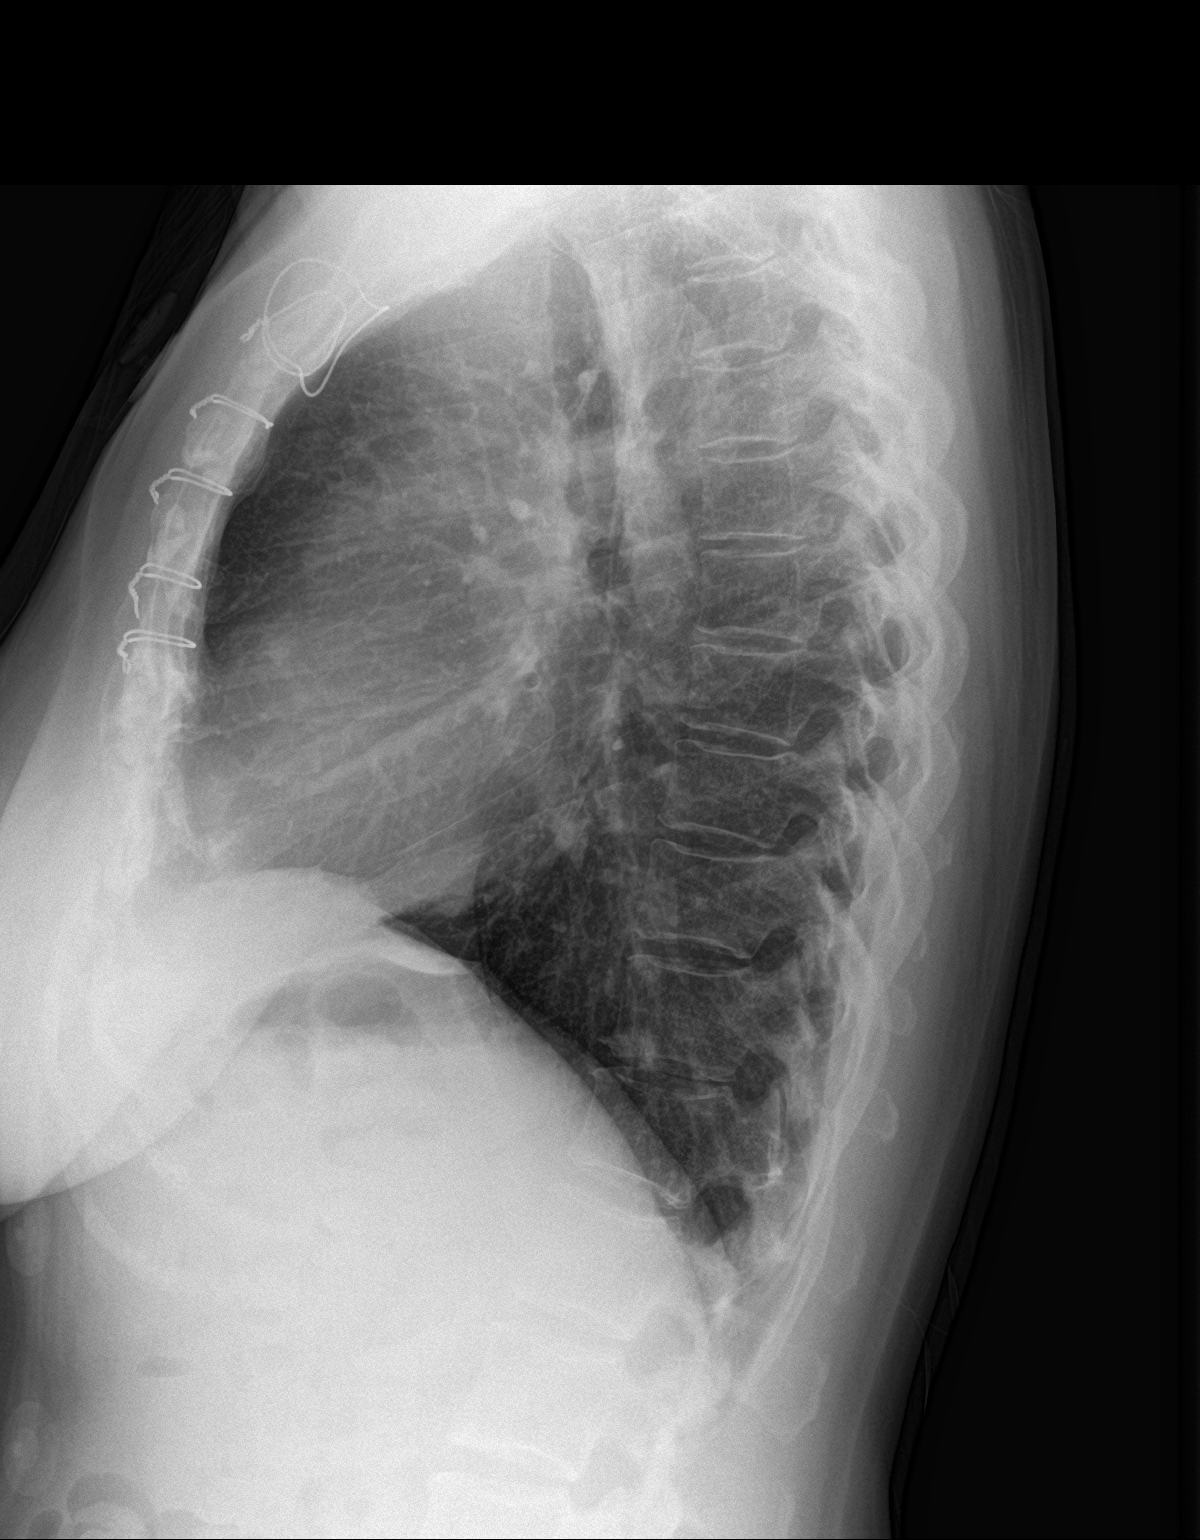

[2 of 2 positions shown; findings below may reference images not displayed]

FINDINGS: Intact sternotomy wires. Stable cardiomediastinal silhouette with
normal heart size. No pneumothorax. No pleural effusion. Lungs
appear clear, with no acute consolidative airspace disease and no
pulmonary edema.
IMPRESSION: No active cardiopulmonary disease.

## 2018-12-01 NOTE — Telephone Encounter (Signed)
Pt wants to get A1C checked but there are not any orders in, is this fine to schedule?

## 2018-12-02 NOTE — Telephone Encounter (Signed)
Tried to call to schedule, left message 

## 2018-12-02 NOTE — Telephone Encounter (Signed)
Yes to schedule.  I will place order.  Thank you!

## 2018-12-02 NOTE — Addendum Note (Signed)
Addended by: Lucille Passy on: 12/02/2018 09:12 AM   Modules accepted: Orders

## 2018-12-08 ENCOUNTER — Other Ambulatory Visit: Payer: Self-pay | Admitting: Family Medicine

## 2018-12-19 ENCOUNTER — Encounter: Payer: Self-pay | Admitting: Gastroenterology

## 2018-12-19 ENCOUNTER — Other Ambulatory Visit: Payer: Self-pay

## 2018-12-19 ENCOUNTER — Ambulatory Visit: Payer: BC Managed Care – PPO | Admitting: Gastroenterology

## 2018-12-19 VITALS — BP 142/90 | HR 72 | Temp 98.5°F | Ht 64.0 in | Wt 170.0 lb

## 2018-12-19 DIAGNOSIS — Z86010 Personal history of colonic polyps: Secondary | ICD-10-CM

## 2018-12-19 DIAGNOSIS — Z800 Family history of malignant neoplasm of digestive organs: Secondary | ICD-10-CM

## 2018-12-19 DIAGNOSIS — K219 Gastro-esophageal reflux disease without esophagitis: Secondary | ICD-10-CM

## 2018-12-19 MED ORDER — PANTOPRAZOLE SODIUM 40 MG PO TBEC: 40.0000 mg | DELAYED_RELEASE_TABLET | Freq: Every day | ORAL | 11 refills | Status: DC

## 2018-12-19 NOTE — Progress Notes (Signed)
    History of Present Illness: This is a 53 year old female returning for mgmt of GERD.  She states her reflux symptoms have been under very good control with very rare episodes of mild breakthrough.  No other gastrointestinal complaints.  Denies weight loss, abdominal pain, constipation, diarrhea, change in stool caliber, melena, hematochezia, nausea, vomiting, dysphagia, chest pain.   Current Medications, Allergies, Past Medical History, Past Surgical History, Family History and Social History were reviewed in [employer] record.   Physical Exam: General: Well developed, well nourished, no acute distress Head: Normocephalic and atraumatic Eyes:  sclerae anicteric, EOMI Ears: Normal auditory acuity Mouth: No deformity or lesions Lungs: Clear throughout to auscultation Heart: Regular rate and rhythm; no murmurs, rubs or bruits Abdomen: Soft, non tender and non distended. No masses, hepatosplenomegaly or hernias noted. Normal Bowel sounds Rectal: Not done Musculoskeletal: Symmetrical with no gross deformities  Pulses:  Normal pulses noted Extremities: No clubbing, cyanosis, edema or deformities noted Neurological: Alert oriented x 4, grossly nonfocal Psychological:  Alert and cooperative. Normal mood and affect   Assessment and Recommendations:  1. GERD.  Follow standard antireflux measures.  Continue pantoprazole 40 mg qd.  Consider changing pantoprazole to every other day and if symptoms remain under very good control she may continue this schedule and if not resume daily dosing. REV in 1 year.   2. FHx colon cancer. Personal history of adenomatous colon polyps. A 5 year interval colonoscopy is recommended in 01/2023.

## 2018-12-19 NOTE — Patient Instructions (Signed)
We have sent the following medications to your pharmacy for you to pick up at your convenience: pantoprazole.   Thank you for choosing me and [facility].  Malcolm T. Stark, Jr., MD., FACG   

## 2018-12-30 ENCOUNTER — Telehealth: Payer: Self-pay

## 2018-12-30 NOTE — Telephone Encounter (Signed)
Questions for Screening COVID-19  Symptom onset: none   Travel or Contacts: none  During this illness, did/does the patient experience any of the following symptoms? Fever >100.43F []   Yes [x]   No []   Unknown Subjective fever (felt feverish) []   Yes [x]   No []   Unknown Chills []   Yes [x]   No []   Unknown Muscle aches (myalgia) []   Yes [x]   No []   Unknown Runny nose (rhinorrhea) []   Yes [x]   No []   Unknown Sore throat []   Yes [x]   No []   Unknown Cough (new onset or worsening of chronic cough) []   Yes [x]   No []   Unknown Shortness of breath (dyspnea) []   Yes [x]   No []   Unknown Nausea or vomiting []   Yes [x]   No []   Unknown Headache []   Yes []   No [x]   Unknown Abdominal pain  []   Yes [x]   No []   Unknown Diarrhea (?3 loose/looser than normal stools/24hr period) []   Yes [x]   No []   Unknown Other, specify:

## 2018-12-30 NOTE — Progress Notes (Signed)
Subjective:   Patient ID: Hannah Rocha, female    DOB: [birth year], 53 y.o.   MRN: [id number]  Hannah Rocha is a pleasant 53 y.o. year old female who presents to clinic today with Follow-up (Patient is here today for a 78-month-F/U. She is due for her flu shot.  A future order is in the system for an A1C and her last was completed on 9.15.20 was 6.8 and she agreed to try dietary changes.  She is currently fasting.)  on 01/02/2019  HPI:   DM- diabetes remains controlled with decreased dose of Metformin- 500 mg daily with breakfast. She does check her FSBS twice daily, fasting FSBS have been ranging     104-132.  Denies any episodes or symptoms of hypoglycemia. Lab Results  Component Value Date   HGBA1C 6.8 (H) 10/05/2018   HLD- we did start her on pravachol 10 mg daily given that her LDL was not at goal for a diabetic despite her lifestyle changes and her had a dramatic improvement in her LDL. Lab Results  Component Value Date   CHOL 180 10/05/2018   HDL 45.70 10/05/2018   LDLCALC 120 (H) 10/05/2018   LDLDIRECT 169.0 03/09/2016   TRIG 72.0 10/05/2018   CHOLHDL 4 10/05/2018   Lab Results  Component Value Date   ALT 40 (H) 10/05/2018   AST 24 10/05/2018   ALKPHOS 77 10/05/2018   BILITOT 0.4 10/05/2018   The 10-year ASCVD risk score Mikey Bussing DC Jr., et al., 2013) is: 4.4%   Values used to calculate the score:     Age: 63 years     Sex: Female     Is Non-Hispanic African American: No     Diabetic: Yes     Tobacco smoker: No     Systolic Blood Pressure: [id number] mmHg     Is BP treated: No     HDL Cholesterol: 45.7 mg/dL     Total Cholesterol: 180 mg/dL  Anxiety- taking lexapro 10 mg daily along with Buspar 30 mg every morning, 15 mg daily at lunch and 15 mg daily at bedtime.  .  Depression screen [facility] 2/9 03/30/2018 05/20/2017 04/10/2016  Decreased Interest 0 0 0  Down, Depressed, Hopeless 0 0 0  PHQ - 2 Score 0 0 0   History of Vit D deficiency- not currently  taking any.  She has been feeling more tired lately.  No DOE or CP.  H/o iron deficiency anemia- Lab Results  Component Value Date   WBC 5.1 10/05/2018   HGB 12.3 10/05/2018   HCT 36.7 10/05/2018   MCV 93.4 10/05/2018   PLT 207.0 10/05/2018   Lab Results  Component Value Date   FERRITIN 39.2 10/05/2018      Current Outpatient Medications on File Prior to Visit  Medication Sig Dispense Refill  . acetaminophen (TYLENOL) 500 MG tablet Take 500 mg by mouth every 6 (six) hours as needed for mild pain.     Marland Kitchen albuterol (PROVENTIL HFA;VENTOLIN HFA) 108 (90 Base) MCG/ACT inhaler INHALE 1-2 PUFFS INTO THE LUNGS EVERY 6 (SIX) HOURS AS NEEDED. 6.7 Inhaler PRN  . B Complex Vitamins (VITAMIN B COMPLEX PO) Take 1 tablet by mouth daily.    . busPIRone (BUSPAR) 30 MG tablet TAKE 1 TABLET BY MOUTH EVERY MORNING 1/2 TAB AT LUNCH, AND 1/2 AT BEDTIME 180 tablet 3  . escitalopram (LEXAPRO) 10 MG tablet TAKE 1 TABLET BY MOUTH EVERY DAY 30 tablet 3  . flintstones complete (FLINTSTONES) 60  MG chewable tablet Chew 1 tablet by mouth daily.    Marland Kitchen glucose 4 GM chewable tablet Chew 1 tablet (4 g total) by mouth as needed for low blood sugar. 50 tablet 12  . glucose blood (ONETOUCH VERIO) test strip Use as instructed to test blood sugar twice daily e11.9 100 each 12  . ibuprofen (ADVIL,MOTRIN) 800 MG tablet Take 1 tablet (800 mg total) by mouth every 8 (eight) hours as needed. 60 tablet 6  . metFORMIN (GLUCOPHAGE) 500 MG tablet TAKE 1 TABLET BY MOUTH EVERY DAY WITH BREAKFAST 90 tablet 3  . ONETOUCH DELICA LANCETS 99991111 MISC Use to test blood sugar once daily E11.9 100 each 1  . pantoprazole (PROTONIX) 40 MG tablet Take 1 tablet (40 mg total) by mouth daily. 30 tablet 11  . pravastatin (PRAVACHOL) 10 MG tablet TAKE 1 TABLET BY MOUTH EVERY DAY 90 tablet 3   No current facility-administered medications on file prior to visit.     Allergies  Allergen Reactions  . Sertraline Hcl     REACTION: vomiting, severe  anxiety    Past Medical History:  Diagnosis Date  . Allergy   . Anemia    hx  . Anxiety   . Cataract    FLOATING CATARACT   . Diabetes mellitus without complication ([place])   . GERD (gastroesophageal reflux disease)   . H/O hiatal hernia   . Heart murmur    ASD repair 1999- no meds   . Hx of dysplastic nevus    multiple sites, some severe  . Hyperlipidemia   . Obesity   . PONV (postoperative nausea and vomiting)     Past Surgical History:  Procedure Laterality Date  . ASD REPAIR  1999   [facility]  . BREAST CYST ASPIRATION Right 2013  . CLEFT LIP REPAIR    . COLONOSCOPY    . LAPAROSCOPIC ASSISTED VAGINAL HYSTERECTOMY Bilateral 04/13/2013   Procedure: LAPAROSCOPIC ASSISTED VAGINAL HYSTERECTOMY;  Surgeon: Emily Filbert, MD;  Location: [place] [place] [facility];  Service: Gynecology;  Laterality: Bilateral;- pt states was abdominal hysterectomy   . LAPAROSCOPIC TUBAL LIGATION  03/21/2012   Procedure: LAPAROSCOPIC TUBAL LIGATION;  Surgeon: Emily Filbert, MD;  Location: [facility] [facility];  Service: Gynecology;  Laterality: Bilateral;  . MANDIBLE FRACTURE SURGERY    . Plantar Fascitis Repair Bilateral 01/2015    Family History  Problem Relation Age of Onset  . Heart disease Mother   . Colon polyps Mother   . Diabetes Mother   . Prostate cancer Father   . Heart disease Father   . Cancer Father        prostate  . Diabetes Maternal Aunt   . Diabetes Paternal Grandmother   . Colon cancer Neg Hx   . Breast cancer Neg Hx   . Esophageal cancer Neg Hx   . Rectal cancer Neg Hx   . Stomach cancer Neg Hx     Social History   Socioeconomic History  . Marital status: Married    Spouse name: Not on file  . Number of children: 0  . Years of education: Not on file  . Highest education level: Not on file  Occupational History  . 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    Employer: [employer]  Social Needs  . Financial resource strain: Not on file  . Food insecurity    Worry: Not on file    Inability: Not on  file  . Transportation needs    Medical: Not on file  Non-medical: Not on file  Tobacco Use  . Smoking status: Former Smoker    Packs/day: 0.00    Years: 0.00    Pack years: 0.00    Types: Cigarettes    Quit date: 02/21/1987    Years since quitting: 31.8  . Smokeless tobacco: Never Used  . Tobacco comment: 22 years ago-light smoker  Substance and Sexual Activity  . Alcohol use: Yes    Alcohol/week: 0.0 standard drinks    Comment: occasional - hard apple cider, shot  . Drug use: No  . Sexual activity: Yes    Partners: Male    Birth control/protection: Surgical    Comment: tubaligation/hysterctomy  Lifestyle  . Physical activity    Days per week: Not on file    Minutes per session: Not on file  . Stress: Not on file  Relationships  . Social Herbalist on phone: Not on file    Gets together: Not on file    Attends religious service: Not on file    Active member of club or organization: Not on file    Attends meetings of clubs or organizations: Not on file    Relationship status: Not on file  . Intimate partner violence    Fear of current or ex partner: Not on file    Emotionally abused: Not on file    Physically abused: Not on file    Forced sexual activity: Not on file  Other Topics Concern  . Not on file  Social History Narrative   Daily caffeine use   The PMH, PSH, Social History, Family History, Medications, and allergies have been reviewed in [facility], and have been updated if relevant.   Review of Systems  Constitutional: Negative for fatigue.  HENT: Negative.   Respiratory: Negative.   Cardiovascular: Negative.   Gastrointestinal: Negative.   Endocrine: Negative.   Musculoskeletal: Negative.   Allergic/Immunologic: Negative.   Neurological: Negative.   Hematological: Negative.   Psychiatric/Behavioral: Negative.        Objective:    BP 140/90 (BP Location: Left Arm, Patient Position: Sitting, Cuff Size: Normal)   Pulse 94   Temp 98.4 F  (36.9 C) (Oral)   Wt 167 lb 9.6 oz (76 kg)   LMP 03/24/2013   SpO2 98%   BMI 28.77 kg/m   Wt Readings from Last 3 Encounters:  01/02/19 167 lb 9.6 oz (76 kg)  12/19/18 170 lb (77.1 kg)  10/05/18 168 lb 3.2 oz (76.3 kg)    Physical Exam Vitals signs and nursing note reviewed.  Constitutional:      Appearance: Normal appearance.  HENT:     Head: Normocephalic and atraumatic.     Right Ear: External ear normal.     Left Ear: External ear normal.     Nose: Nose normal.     Mouth/Throat:     Mouth: Mucous membranes are moist.  Eyes:     Pupils: Pupils are equal, round, and reactive to light.  Cardiovascular:     Rate and Rhythm: Normal rate and regular rhythm.     Pulses: Normal pulses.  Pulmonary:     Effort: Pulmonary effort is normal.     Breath sounds: Normal breath sounds.  Abdominal:     General: Abdomen is flat.     Palpations: Abdomen is soft.  Musculoskeletal: Normal range of motion.  Skin:    General: Skin is warm and dry.  Neurological:     General: No focal deficit present.  Mental Status: She is alert and oriented to person, place, and time.  Psychiatric:        Mood and Affect: Mood normal.        Behavior: Behavior normal.

## 2019-01-02 ENCOUNTER — Other Ambulatory Visit: Payer: Self-pay

## 2019-01-02 ENCOUNTER — Encounter: Payer: Self-pay | Admitting: Family Medicine

## 2019-01-02 ENCOUNTER — Ambulatory Visit: Payer: BC Managed Care – PPO | Admitting: Family Medicine

## 2019-01-02 VITALS — BP 140/90 | HR 94 | Temp 98.4°F | Wt 167.6 lb

## 2019-01-02 DIAGNOSIS — Z23 Encounter for immunization: Secondary | ICD-10-CM | POA: Diagnosis not present

## 2019-01-02 DIAGNOSIS — E119 Type 2 diabetes mellitus without complications: Secondary | ICD-10-CM

## 2019-01-02 DIAGNOSIS — E785 Hyperlipidemia, unspecified: Secondary | ICD-10-CM | POA: Diagnosis not present

## 2019-01-02 DIAGNOSIS — F419 Anxiety disorder, unspecified: Secondary | ICD-10-CM

## 2019-01-02 LAB — COMPREHENSIVE METABOLIC PANEL
ALT: 17 U/L (ref 0–35)
AST: 16 U/L (ref 0–37)
Albumin: 4.4 g/dL (ref 3.5–5.2)
Alkaline Phosphatase: 77 U/L (ref 39–117)
BUN: 14 mg/dL (ref 6–23)
CO2: 28 mEq/L (ref 19–32)
Calcium: 9.1 mg/dL (ref 8.4–10.5)
Chloride: 104 mEq/L (ref 96–112)
Creatinine, Ser: 0.81 mg/dL (ref 0.40–1.20)
GFR: 73.48 mL/min (ref >60.00)
Glucose, Bld: 123 mg/dL — ABNORMAL HIGH (ref 70–99)
Potassium: 4.6 mEq/L (ref 3.5–5.1)
Sodium: 140 mEq/L (ref 135–145)
Total Bilirubin: 0.4 mg/dL (ref 0.2–1.2)
Total Protein: 6.9 g/dL (ref 6.0–8.3)

## 2019-01-02 LAB — HEMOGLOBIN A1C: Hgb A1c MFr Bld: 7.1 % — ABNORMAL HIGH (ref 4.6–6.5)

## 2019-01-02 LAB — LIPID PANEL
Cholesterol: 196 mg/dL (ref 0–200)
HDL: 41.2 mg/dL (ref >39.00)
LDL Cholesterol: 136 mg/dL — ABNORMAL HIGH (ref 0–99)
NonHDL: 154.37
Total CHOL/HDL Ratio: 5
Triglycerides: 93 mg/dL (ref 0.0–149.0)
VLDL: 18.6 mg/dL (ref 0.0–40.0)

## 2019-01-02 LAB — TSH: TSH: 2.97 u[IU]/mL (ref 0.35–4.50)

## 2019-01-02 NOTE — Assessment & Plan Note (Signed)
Continue statin. Check labs today. Orders Placed This Encounter  Procedures  . Flu Vaccine QUAD 6+ mos PF IM (Fluarix Quad PF)  . Hemoglobin A1c  . Comprehensive metabolic panel  . Lipid panel  . TSH

## 2019-01-02 NOTE — Assessment & Plan Note (Addendum)
Well controlled.  Repeat labs today. No changes made to rxs. Labs today.

## 2019-01-02 NOTE — Patient Instructions (Signed)
Great to see you. I will call you with your lab results from today and you can view them online.   It was great to see you!

## 2019-01-02 NOTE — Assessment & Plan Note (Signed)
Well controlled on current rxs. She has enjoyed working from home.

## 2019-03-13 ENCOUNTER — Other Ambulatory Visit: Payer: Self-pay | Admitting: Family Medicine

## 2019-03-13 ENCOUNTER — Encounter: Payer: Self-pay | Admitting: Radiology

## 2019-03-15 NOTE — Telephone Encounter (Signed)
Last OV 01/02/19 Last fill 11/04/18  #30/3

## 2019-03-28 NOTE — Progress Notes (Signed)
Virtual Visit via Video   Due to the COVID-19 pandemic, this visit was completed with telemedicine (audio/video) technology to reduce patient and provider exposure as well as to preserve personal protective equipment.   I connected with Hannah Rocha by a video enabled telemedicine application and verified that I am speaking with the correct person using two identifiers. Location patient: Home Location provider: [facility], Office Persons participating in the virtual visit: Briscoe Deutscher, MD   I discussed the limitations of evaluation and management by telemedicine and the availability of in person appointments. The patient expressed understanding and agreed to proceed.  Care Team   Patient Care Team: Lucille Passy, MD as PCP - General (Family Medicine)  Subjective:   HPI:   Right wrist pain x 2 months.  Has been working from home- notices pain on the base of her medial wrist getting progressively worse. No decreased grip strength.  She is right hand dominant and using a mouse all day.  No tingling in her fingers.  Has not tried anything for it.     Review of Systems  Constitutional: Negative for fever and malaise/fatigue.  HENT: Negative for congestion and hearing loss.   Eyes: Negative for blurred vision, discharge and redness.  Respiratory: Negative for cough and shortness of breath.   Cardiovascular: Negative for chest pain, palpitations and leg swelling.  Gastrointestinal: Negative for abdominal pain and heartburn.  Genitourinary: Negative for dysuria.  Musculoskeletal: Positive for myalgias. Negative for falls.  Skin: Negative for rash.  Neurological: Negative for loss of consciousness and headaches.  Endo/Heme/Allergies: Does not bruise/bleed easily.  Psychiatric/Behavioral: Negative for depression.  All other systems reviewed and are negative.    Patient Active Problem List   Diagnosis Date Noted  . Plant allergic contact  dermatitis 10/13/2018  . Well woman exam without gynecological exam 10/04/2018  . HLD (hyperlipidemia) 03/09/2016  . Diabetes ([place]) 03/09/2016  . Vitamin D deficiency 03/09/2016  . Varicose veins 12/28/2013  . Elevated blood pressure 04/04/2012  . Anxiety 04/04/2012  . GERD (gastroesophageal reflux disease) 01/23/2011  . 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 MENOPAUSAL SYNDROME 01/15/2010  . ANEMIA-IRON DEFICIENCY 06/21/2009  . IBS 06/21/2009  . IRON DEFICIENCY ANEMIA, HX OF 07/10/2008  . HIATAL HERNIA 07/09/2008  . Esophageal reflux 05/03/2007  . ATRIAL SEPTAL DEFECT, SECUNDUM TYPE 05/03/2007  . 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  . CARDIAC MURMUR, HX OF 05/03/2007    Social History   Tobacco Use  . Smoking status: Former Smoker    Packs/day: 0.00    Years: 0.00    Pack years: 0.00    Types: Cigarettes    Quit date: 02/21/1987    Years since quitting: 32.1  . Smokeless tobacco: Never Used  . Tobacco comment: 22 years ago-light smoker  Substance Use Topics  . Alcohol use: Yes    Alcohol/week: 0.0 standard drinks    Comment: occasional - hard apple cider, shot    Current Outpatient Medications:  .  acetaminophen (TYLENOL) 500 MG tablet, Take 500 mg by mouth every 6 (six) hours as needed for mild pain. , Disp: , Rfl:  .  albuterol (PROVENTIL HFA;VENTOLIN HFA) 108 (90 Base) MCG/ACT inhaler, INHALE 1-2 PUFFS INTO THE LUNGS EVERY 6 (SIX) HOURS AS NEEDED., Disp: 6.7 Inhaler, Rfl: PRN .  B Complex Vitamins (VITAMIN B COMPLEX PO), Take 1 tablet by mouth daily., Disp: , Rfl:  .  busPIRone (BUSPAR) 30 MG tablet,  TAKE 1 TABLET BY MOUTH EVERY MORNING 1/2 TAB AT LUNCH, AND 1/2 AT BEDTIME, Disp: 180 tablet, Rfl: 3 .  escitalopram (LEXAPRO) 10 MG tablet, TAKE 1 TABLET BY MOUTH EVERY DAY, Disp: 30 tablet, Rfl: 3 .  flintstones complete (FLINTSTONES) 60 MG chewable tablet, Chew 1 tablet by mouth daily., Disp: , Rfl:  .  glucose 4 GM chewable tablet,  Chew 1 tablet (4 g total) by mouth as needed for low blood sugar., Disp: 50 tablet, Rfl: 12 .  glucose blood (ONETOUCH VERIO) test strip, Use as instructed to test blood sugar twice daily e11.9, Disp: 100 each, Rfl: 12 .  ibuprofen (ADVIL,MOTRIN) 800 MG tablet, Take 1 tablet (800 mg total) by mouth every 8 (eight) hours as needed., Disp: 60 tablet, Rfl: 6 .  metFORMIN (GLUCOPHAGE) 500 MG tablet, TAKE 1 TABLET BY MOUTH EVERY DAY WITH BREAKFAST, Disp: 90 tablet, Rfl: 3 .  ONETOUCH DELICA LANCETS 99991111 MISC, Use to test blood sugar once daily E11.9, Disp: 100 each, Rfl: 1 .  pantoprazole (PROTONIX) 40 MG tablet, Take 1 tablet (40 mg total) by mouth daily., Disp: 30 tablet, Rfl: 11 .  pravastatin (PRAVACHOL) 10 MG tablet, TAKE 1 TABLET BY MOUTH EVERY DAY, Disp: 90 tablet, Rfl: 3  Allergies  Allergen Reactions  . Sertraline Hcl     REACTION: vomiting, severe anxiety    Objective:  Ht 5\' 4"  (1.626 m)   Wt 167 lb (75.8 kg)   LMP 03/24/2013   BMI 28.67 kg/m   VITALS: Per patient if applicable, see vitals. GENERAL: Alert, appears well and in no acute distress. HEENT: Atraumatic, conjunctiva clear, no obvious abnormalities on inspection of external nose and ears. NECK: Normal movements of the head and neck. CARDIOPULMONARY: No increased WOB. Speaking in clear sentences. I:E ratio WNL.  MS: +finklestein's on right PSYCH: Pleasant and cooperative, well-groomed. Speech normal rate and rhythm. Affect is appropriate. Insight and judgement are appropriate. Attention is focused, linear, and appropriate.  NEURO: CN grossly intact. Oriented as arrived to appointment on time with no prompting. Moves both UE equally.  SKIN: No obvious lesions, wounds, erythema, or cyanosis noted on face or hands.  Depression screen [facility] 2/9 03/30/2018 05/20/2017 04/10/2016  Decreased Interest 0 0 0  Down, Depressed, Hopeless 0 0 0  PHQ - 2 Score 0 0 0     . COVID-19 Education: The signs and symptoms of COVID-19 were  discussed with the patient and how to seek care for testing if needed. The importance of social distancing was discussed today. . Reviewed expectations re: course of current medical issues. . Discussed self-management of symptoms. . Outlined signs and symptoms indicating need for more acute intervention. . Patient verbalized understanding and all questions were answered. Marland Kitchen Health Maintenance issues including appropriate healthy diet, exercise, and smoking avoidance were discussed with patient. . See orders for this visit as documented in the electronic medical record.  Arnette Norris, MD  Records requested if needed. Time spent: 25 minutes, of which >50% was spent in obtaining information about her symptoms, reviewing her previous labs, evaluations, and treatments, counseling her about her condition (please see the discussed topics above), and developing a plan to further investigate it; she had a number of questions which I addressed.   Lab Results  Component Value Date   WBC 5.1 10/05/2018   HGB 12.3 10/05/2018   HCT 36.7 10/05/2018   PLT 207.0 10/05/2018   GLUCOSE 123 (H) 01/02/2019   CHOL 196 01/02/2019   TRIG  93.0 01/02/2019   HDL 41.20 01/02/2019   LDLDIRECT 169.0 03/09/2016   LDLCALC 136 (H) 01/02/2019   ALT 17 01/02/2019   AST 16 01/02/2019   NA 140 01/02/2019   K 4.6 01/02/2019   CL 104 01/02/2019   CREATININE 0.81 01/02/2019   BUN 14 01/02/2019   CO2 28 01/02/2019   TSH 2.97 01/02/2019   HGBA1C 7.1 (H) 01/02/2019   MICROALBUR <0.7 10/05/2018    Lab Results  Component Value Date   TSH 2.97 01/02/2019   Lab Results  Component Value Date   WBC 5.1 10/05/2018   HGB 12.3 10/05/2018   HCT 36.7 10/05/2018   MCV 93.4 10/05/2018   PLT 207.0 10/05/2018   Lab Results  Component Value Date   NA 140 01/02/2019   K 4.6 01/02/2019   CO2 28 01/02/2019   GLUCOSE 123 (H) 01/02/2019   BUN 14 01/02/2019   CREATININE 0.81 01/02/2019   BILITOT 0.4 01/02/2019   ALKPHOS 77  01/02/2019   AST 16 01/02/2019   ALT 17 01/02/2019   PROT 6.9 01/02/2019   ALBUMIN 4.4 01/02/2019   CALCIUM 9.1 01/02/2019   ANIONGAP 14 10/20/2017   GFR 73.48 01/02/2019   Lab Results  Component Value Date   CHOL 196 01/02/2019   Lab Results  Component Value Date   HDL 41.20 01/02/2019   Lab Results  Component Value Date   LDLCALC 136 (H) 01/02/2019   Lab Results  Component Value Date   TRIG 93.0 01/02/2019   Lab Results  Component Value Date   CHOLHDL 5 01/02/2019   Lab Results  Component Value Date   HGBA1C 7.1 (H) 01/02/2019       Assessment & Plan:   Problem List Items Addressed This Visit    None      I am having Hannah Rocha maintain her flintstones complete, acetaminophen, B Complex Vitamins (VITAMIN B COMPLEX PO), OneTouch Delica Lancets 99991111, glucose blood, albuterol, glucose, ibuprofen, pravastatin, busPIRone, metFORMIN, pantoprazole, and escitalopram.  No orders of the defined types were placed in this encounter.    Arnette Norris, MD

## 2019-03-29 ENCOUNTER — Encounter: Payer: Self-pay | Admitting: Family Medicine

## 2019-03-29 ENCOUNTER — Other Ambulatory Visit: Payer: Self-pay

## 2019-03-29 ENCOUNTER — Other Ambulatory Visit: Payer: Self-pay | Admitting: Family Medicine

## 2019-03-29 ENCOUNTER — Telehealth (INDEPENDENT_AMBULATORY_CARE_PROVIDER_SITE_OTHER): Payer: BC Managed Care – PPO | Admitting: Family Medicine

## 2019-03-29 VITALS — Ht 64.0 in | Wt 167.0 lb

## 2019-03-29 DIAGNOSIS — E7800 Pure hypercholesterolemia, unspecified: Secondary | ICD-10-CM

## 2019-03-29 DIAGNOSIS — M25531 Pain in right wrist: Secondary | ICD-10-CM | POA: Diagnosis not present

## 2019-03-29 DIAGNOSIS — G5600 Carpal tunnel syndrome, unspecified upper limb: Secondary | ICD-10-CM | POA: Insufficient documentation

## 2019-03-29 DIAGNOSIS — M654 Radial styloid tenosynovitis [de Quervain]: Secondary | ICD-10-CM

## 2019-03-29 DIAGNOSIS — E119 Type 2 diabetes mellitus without complications: Secondary | ICD-10-CM

## 2019-03-29 HISTORY — DX: Radial styloid tenosynovitis (de quervain): M65.4

## 2019-03-29 NOTE — Assessment & Plan Note (Signed)
New- discussed course and treatment.  Advised changing height of her desk, getting a pad under her keyboard (to rest her wrist) as she had at work, getting a splint, ice and NSAIDS.  If no improvement, consider PT.   Call or send my chart message prn if these symptoms worsen or fail to improve as anticipated.  The patient indicates understanding of these issues and agrees with the plan.  Sent the follow message via mychart as well:   De Quervain's Tenosynovitis  De Quervain's tenosynovitis is a condition that causes inflammation of the tendon on the thumb side of the wrist. Tendons are cords of tissue that connect bones to muscles. The tendons in the hand pass through a tunnel called a sheath. A slippery layer of tissue (synovium) lets the tendons move smoothly in the sheath. With de Quervain's tenosynovitis, the sheath swells or thickens, causing friction and pain. The condition is also called de Quervain's disease and de Quervain's syndrome. It occurs most often in women who are 55-82 years old. What are the causes? The exact cause of this condition is not known. It may be associated with overuse of the hand and wrist. What increases the risk? You are more likely to develop this condition if you:  Use your hands far more than normal, especially if you repeat certain movements that involve twisting your hand or using a tight grip.  Are pregnant.  Are a middle-aged woman.  Have rheumatoid arthritis.  Have diabetes. What are the signs or symptoms? The main symptom of this condition is pain on the thumb side of the wrist. The pain may get worse when you grasp something or turn your wrist. Other symptoms may include:  Pain that extends up the forearm.  Swelling of your wrist and hand.  Trouble moving the thumb and wrist.  A sensation of snapping in the wrist.  A bump filled with fluid (cyst) in the area of the pain. How is this diagnosed? This condition may be diagnosed based  on:  Your symptoms and medical history.  A physical exam. During the exam, your health care provider may do a simple test Wynn Maudlin test) that involves pulling your thumb and wrist to see if this causes pain. You may also need to have an X-ray. How is this treated? Treatment for this condition may include:  Avoiding any activity that causes pain and swelling.  Taking medicines. Anti-inflammatory medicines and corticosteroid injections may be used to reduce inflammation and relieve pain.  Wearing a splint (thumb spica splint).  Having surgery. This may be needed if other treatments do not work. Once the pain and swelling has gone down:  Physical therapy. This includes stretching and strengthening exercises.  Occupational therapy. This includes adjusting how you move your wrist. Follow these instructions at home: If you have a splint:  Wear the splint as told by your health care provider. Remove it only as told by your health care provider.  Loosen the splint if your fingers tingle, become numb, or turn cold and blue.  Keep the splint clean.  If the splint is not waterproof: ? Do not let it get wet. ? Cover it with a watertight covering when you take a bath or a shower. Managing pain, stiffness, and swelling   Avoid movements and activities that cause pain and swelling in the wrist area.  If directed, put ice on the painful area. This may be helpful after doing activities that involve the sore wrist. ? Put ice in  a plastic bag. ? Place a towel between your skin and the bag. ? Leave the ice on for 20 minutes, 2-3 times a day.  Move your fingers often to avoid stiffness and to lessen swelling.  Raise (elevate) the injured area above the level of your heart while you are sitting or lying down. General instructions  Return to your normal activities as told by your health care provider. Ask your health care provider what activities are safe for you.  Take  over-the-counter and prescription medicines only as told by your health care provider.  Keep all follow-up visits as told by your health care provider. This is important. Contact a health care provider if:  Your pain medicine does not help.  Your pain gets worse.  You develop new symptoms. Summary  De Quervain's tenosynovitis is a condition that causes inflammation of the tendon on the thumb side of the wrist.  The condition occurs most often in women who are 55-2 years old.  The exact cause of this condition is not known. It may be associated with overuse of the hand and wrist.  Treatment starts with avoiding activity that causes pain or swelling in the wrist area. Other treatment may include wearing a splint and taking medicine. Sometimes, surgery is needed.

## 2019-03-29 NOTE — Patient Instructions (Signed)
De Quervain's Tenosynovitis  De Quervain's tenosynovitis is a condition that causes inflammation of the tendon on the thumb side of the wrist. Tendons are cords of tissue that connect bones to muscles. The tendons in the hand pass through a tunnel called a sheath. A slippery layer of tissue (synovium) lets the tendons move smoothly in the sheath. With de Quervain's tenosynovitis, the sheath swells or thickens, causing friction and pain. The condition is also called de Quervain's disease and de Quervain's syndrome. It occurs most often in women who are 66-30 years old. What are the causes? The exact cause of this condition is not known. It may be associated with overuse of the hand and wrist. What increases the risk? You are more likely to develop this condition if you:  Use your hands far more than normal, especially if you repeat certain movements that involve twisting your hand or using a tight grip.  Are pregnant.  Are a middle-aged woman.  Have rheumatoid arthritis.  Have diabetes. What are the signs or symptoms? The main symptom of this condition is pain on the thumb side of the wrist. The pain may get worse when you grasp something or turn your wrist. Other symptoms may include:  Pain that extends up the forearm.  Swelling of your wrist and hand.  Trouble moving the thumb and wrist.  A sensation of snapping in the wrist.  A bump filled with fluid (cyst) in the area of the pain. How is this diagnosed? This condition may be diagnosed based on:  Your symptoms and medical history.  A physical exam. During the exam, your health care provider may do a simple test Wynn Maudlin test) that involves pulling your thumb and wrist to see if this causes pain. You may also need to have an X-ray. How is this treated? Treatment for this condition may include:  Avoiding any activity that causes pain and swelling.  Taking medicines. Anti-inflammatory medicines and corticosteroid  injections may be used to reduce inflammation and relieve pain.  Wearing a splint (thumb spica splint).  Having surgery. This may be needed if other treatments do not work. Once the pain and swelling has gone down:  Physical therapy. This includes stretching and strengthening exercises.  Occupational therapy. This includes adjusting how you move your wrist. Follow these instructions at home: If you have a splint:  Wear the splint as told by your health care provider. Remove it only as told by your health care provider.  Loosen the splint if your fingers tingle, become numb, or turn cold and blue.  Keep the splint clean.  If the splint is not waterproof: ? Do not let it get wet. ? Cover it with a watertight covering when you take a bath or a shower. Managing pain, stiffness, and swelling   Avoid movements and activities that cause pain and swelling in the wrist area.  If directed, put ice on the painful area. This may be helpful after doing activities that involve the sore wrist. ? Put ice in a plastic bag. ? Place a towel between your skin and the bag. ? Leave the ice on for 20 minutes, 2-3 times a day.  Move your fingers often to avoid stiffness and to lessen swelling.  Raise (elevate) the injured area above the level of your heart while you are sitting or lying down. General instructions  Return to your normal activities as told by your health care provider. Ask your health care provider what activities are safe for you.  Take over-the-counter and prescription medicines only as told by your health care provider.  Keep all follow-up visits as told by your health care provider. This is important. Contact a health care provider if:  Your pain medicine does not help.  Your pain gets worse.  You develop new symptoms. Summary  De Quervain's tenosynovitis is a condition that causes inflammation of the tendon on the thumb side of the wrist.  The condition occurs most  often in women who are 8-61 years old.  The exact cause of this condition is not known. It may be associated with overuse of the hand and wrist.  Treatment starts with avoiding activity that causes pain or swelling in the wrist area. Other treatment may include wearing a splint and taking medicine. Sometimes, surgery is needed. This information is not intended to replace advice given to you by your health care provider. Make sure you discuss any questions you have with your health care provider. Document Revised: 09/09/2017 Document Reviewed: 02/15/2017 Elsevier Patient Education  2020 [employer].

## 2019-03-30 ENCOUNTER — Other Ambulatory Visit (INDEPENDENT_AMBULATORY_CARE_PROVIDER_SITE_OTHER): Payer: BC Managed Care – PPO

## 2019-03-30 DIAGNOSIS — E119 Type 2 diabetes mellitus without complications: Secondary | ICD-10-CM

## 2019-03-30 DIAGNOSIS — E7800 Pure hypercholesterolemia, unspecified: Secondary | ICD-10-CM | POA: Diagnosis not present

## 2019-03-30 LAB — COMPREHENSIVE METABOLIC PANEL
ALT: 17 U/L (ref 0–35)
AST: 15 U/L (ref 0–37)
Albumin: 4.3 g/dL (ref 3.5–5.2)
Alkaline Phosphatase: 79 U/L (ref 39–117)
BUN: 17 mg/dL (ref 6–23)
CO2: 28 mEq/L (ref 19–32)
Calcium: 9.4 mg/dL (ref 8.4–10.5)
Chloride: 103 mEq/L (ref 96–112)
Creatinine, Ser: 0.78 mg/dL (ref 0.40–1.20)
GFR: 76.69 mL/min (ref >60.00)
Glucose, Bld: 128 mg/dL — ABNORMAL HIGH (ref 70–99)
Potassium: 4.4 mEq/L (ref 3.5–5.1)
Sodium: 138 mEq/L (ref 135–145)
Total Bilirubin: 0.4 mg/dL (ref 0.2–1.2)
Total Protein: 7.4 g/dL (ref 6.0–8.3)

## 2019-03-30 LAB — LIPID PANEL
Cholesterol: 191 mg/dL (ref 0–200)
HDL: 40.3 mg/dL (ref >39.00)
LDL Cholesterol: 126 mg/dL — ABNORMAL HIGH (ref 0–99)
NonHDL: 150.73
Total CHOL/HDL Ratio: 5
Triglycerides: 123 mg/dL (ref 0.0–149.0)
VLDL: 24.6 mg/dL (ref 0.0–40.0)

## 2019-03-30 LAB — TSH: TSH: 3.22 u[IU]/mL (ref 0.35–4.50)

## 2019-03-30 LAB — HEMOGLOBIN A1C: Hgb A1c MFr Bld: 6.9 % — ABNORMAL HIGH (ref 4.6–6.5)

## 2019-04-03 ENCOUNTER — Encounter: Payer: Self-pay | Admitting: Family Medicine

## 2019-04-28 ENCOUNTER — Other Ambulatory Visit: Payer: Self-pay | Admitting: Family Medicine

## 2019-06-14 ENCOUNTER — Other Ambulatory Visit: Payer: Self-pay

## 2019-06-14 ENCOUNTER — Encounter: Payer: Self-pay | Admitting: Family Medicine

## 2019-06-14 ENCOUNTER — Ambulatory Visit: Payer: BC Managed Care – PPO | Admitting: Family Medicine

## 2019-06-14 VITALS — BP 136/74 | HR 76 | Ht 64.0 in | Wt 170.0 lb

## 2019-06-14 DIAGNOSIS — M25531 Pain in right wrist: Secondary | ICD-10-CM | POA: Diagnosis not present

## 2019-06-14 DIAGNOSIS — E119 Type 2 diabetes mellitus without complications: Secondary | ICD-10-CM

## 2019-06-14 DIAGNOSIS — Z7689 Persons encountering health services in other specified circumstances: Secondary | ICD-10-CM | POA: Diagnosis not present

## 2019-06-14 NOTE — Progress Notes (Signed)
Subjective:    Patient ID: Hannah Rocha, female    DOB: 16-Oct-1965, 54 y.o.   MRN: [id number]  HPI Chief Complaint  Patient presents with  . New Patient (Initial Visit)   This is a 54 yo female who presents today to establish care. Prior patient Dr. Deborra Medina.  Works from home for an [employer]. Games on phone, riding motorcyles  Last CPE- sees gyn, Margarito Courser 09/2018 Mammo- 05/04/2018 Pap- gyn Colonoscopy- 01/17/18 Tdap- 09/22/2017 Flu- annual Eye- regular, wears glasses Dental-  Exercise- walks daily Sleep- hot flashes, history of hysterectomy, has appointment with gyn next week Stress- manageable  Diet- am- oatmeal (15 gm carbs), egg beaters, 1 piece toast or [employer] ww, [place] sausage sandwich Lunch- sandwich, fruit, chips. Snack- blueberries, nuts, trail mix Dinner- chicken, fish, rare beef, vegetables  DM- checks her blood sugar twice a day, runs ranges 95- a little over 200 (3x/ a month).   Tendonitis of right hand- had virtual visit with Dr. Deborra Medina last month, has been using splint at night, unable to use during day due to it slowing down her ability to type quickly. Now with new swelling right forearm.   Past Medical History:  Diagnosis Date  . Allergy   . Anemia    hx  . Anxiety   . Cataract    FLOATING CATARACT   . Diabetes mellitus without complication ([place])   . GERD (gastroesophageal reflux disease)   . H/O hiatal hernia   . Heart murmur    ASD repair 1999- no meds   . Hx of dysplastic nevus    multiple sites, some severe  . Hyperlipidemia   . Obesity   . PONV (postoperative nausea and vomiting)    Past Surgical History:  Procedure Laterality Date  . ASD REPAIR  1999   [facility]  . BREAST CYST ASPIRATION Right 2013  . CLEFT LIP REPAIR    . COLONOSCOPY    . LAPAROSCOPIC ASSISTED VAGINAL HYSTERECTOMY Bilateral 04/13/2013   Procedure: LAPAROSCOPIC ASSISTED VAGINAL HYSTERECTOMY;  Surgeon: Emily Filbert, MD;  Location: [facility];  Service: Gynecology;   Laterality: Bilateral;- pt states was abdominal hysterectomy   . LAPAROSCOPIC TUBAL LIGATION  03/21/2012   Procedure: LAPAROSCOPIC TUBAL LIGATION;  Surgeon: Emily Filbert, MD;  Location: [facility];  Service: Gynecology;  Laterality: Bilateral;  . MANDIBLE FRACTURE SURGERY    . Plantar Fascitis Repair Bilateral 01/2015   Family History  Problem Relation Age of Onset  . Heart disease Mother   . Colon polyps Mother   . Diabetes Mother   . Prostate cancer Father   . Heart disease Father   . Cancer Father        prostate  . Diabetes Maternal Aunt   . Diabetes Paternal Grandmother   . Colon cancer Neg Hx   . Breast cancer Neg Hx   . Esophageal cancer Neg Hx   . Rectal cancer Neg Hx   . Stomach cancer Neg Hx    Social History   Tobacco Use  . Smoking status: Former Smoker    Packs/day: 0.00    Years: 0.00    Pack years: 0.00    Types: Cigarettes    Quit date: 02/21/1987    Years since quitting: 32.3  . Smokeless tobacco: Never Used  . Tobacco comment: 22 years ago-light smoker  Substance Use Topics  . Alcohol use: Yes    Alcohol/week: 0.0 standard drinks    Comment: occasional - hard apple cider,  shot  . Drug use: No     Review of Systems     Objective:   Physical Exam Vitals reviewed.  Constitutional:      General: She is not in acute distress.    Appearance: Normal appearance. She is normal weight. She is not ill-appearing, toxic-appearing or diaphoretic.  HENT:     Head: Normocephalic and atraumatic.  Eyes:     Conjunctiva/sclera: Conjunctivae normal.  Cardiovascular:     Rate and Rhythm: Normal rate.  Pulmonary:     Effort: Pulmonary effort is normal.  Musculoskeletal:     Right forearm: Swelling present.     Right hand: No tenderness or bony tenderness. Normal range of motion. Normal strength. Normal capillary refill. Normal pulse.       Arms:  Skin:    General: Skin is warm and dry.  Neurological:     Mental Status: She is alert and oriented to person,  place, and time.  Psychiatric:        Mood and Affect: Mood normal.        Behavior: Behavior normal.        Thought Content: Thought content normal.        Judgment: Judgment normal.       BP 136/74 (BP Location: Left Arm, Patient Position: Sitting, Cuff Size: Normal)   Pulse 76   Ht 5\' 4"  (1.626 m)   Wt 170 lb (77.1 kg)   LMP 03/24/2013   SpO2 98%   BMI 29.18 kg/m   Wt Readings from Last 3 Encounters:  06/14/19 170 lb (77.1 kg)  03/29/19 167 lb (75.8 kg)  01/02/19 167 lb 9.6 oz (76 kg)       Assessment & Plan:  1. Encounter to establish care - reviewed available records in EMR, health maintenance - follow up for CPE 7/21, labs  2. Right wrist pain - worsening symptoms, will refer to hand surgery - Ambulatory referral to Hand Surgery  3. Type 2 diabetes mellitus without complication, without long-term current use of insulin ([place]) - not due for hgba1c, will check labs with CPE 7/21  This visit occurred during the SARS-CoV-2 public health emergency.  Safety protocols were in place, including screening questions prior to the visit, additional usage of staff PPE, and extensive cleaning of exam room while observing appropriate contact time as indicated for disinfecting solutions.      Clarene Reamer, FNP-BC  [facility]  06/14/2019 4:51 PM

## 2019-06-14 NOTE — Patient Instructions (Addendum)
Please call and schedule an appointment for screening mammogram. A referral is not needed.  [place]   A resource that I like is [email or web address]  Youtube resources- Dr. Sharman Cheek, Dr. Gwenlyn Found  I recommend you check your blood sugar daily and keep a log.  Very the time you check your blood sugar such as fasting, before meal, 2 hours after a meal and at bedtime.  Look for trends with the foods you are eating and be a scientist of your body.  Here are some guidelines to help you with meal planning -  Avoid all processed and packaged foods (bread, pasta, crackers, chips, etc) and beverages containing calories.  Avoid added sugars and excessive natural sugars.  Attention to how you feel if you consume artificial sweeteners.  Do they make you more hungry or raise your blood sugar?  With every meal and snack, aim to get 20 g of protein (3 ounces of meat, 4 ounces of fish, 3 eggs, protein powder, 1 cup [place] yogurt, 1 cup cottage cheese, etc.)  Increase fiber in the form of non-starchy vegetables.  These help you feel full with very little carbohydrates and are good for gut health.  Eat 1 serving healthy carb per meal- 1/2 cup brown rice, beans, potato, corn- pay attention to whether or not this significantly raises your blood sugar. If it does, reduce the frequency you consume these.   Eat 2-3 servings of lower sugar fruits daily.  This includes berries, apples, oranges, peaches, pears, one half banana.  Have small amounts of good fats such as avocado, nuts, olive oil, nut butters, olives.  Add a little cheese to your salads to make them tasty.

## 2019-06-19 ENCOUNTER — Encounter: Payer: Self-pay | Admitting: Family Medicine

## 2019-06-19 ENCOUNTER — Ambulatory Visit (INDEPENDENT_AMBULATORY_CARE_PROVIDER_SITE_OTHER): Payer: BC Managed Care – PPO | Admitting: Family Medicine

## 2019-06-19 ENCOUNTER — Other Ambulatory Visit: Payer: Self-pay

## 2019-06-19 VITALS — BP 169/82 | HR 72 | Wt 171.8 lb

## 2019-06-19 DIAGNOSIS — N951 Menopausal and female climacteric states: Secondary | ICD-10-CM | POA: Diagnosis not present

## 2019-06-19 DIAGNOSIS — Z01419 Encounter for gynecological examination (general) (routine) without abnormal findings: Secondary | ICD-10-CM

## 2019-06-19 DIAGNOSIS — Z90710 Acquired absence of both cervix and uterus: Secondary | ICD-10-CM | POA: Diagnosis not present

## 2019-06-19 MED ORDER — IBUPROFEN 800 MG PO TABS: 800.0000 mg | ORAL_TABLET | Freq: Three times a day (TID) | ORAL | 6 refills | Status: DC | PRN

## 2019-06-19 NOTE — Progress Notes (Signed)
Discuss hot flashes

## 2019-06-19 NOTE — Progress Notes (Signed)
  Subjective:     Hannah Rocha is a 54 y.o. female and is here for a comprehensive physical exam. The patient reports problems - hot flashes, most nights. S/p LAVH with Dr. Hulan Fray. Ovaries remain, having on-going hot flashes and night sweats.   The following portions of the patient's history were reviewed and updated as appropriate: allergies, current medications, past family history, past medical history, past social history, past surgical history and problem list.  Review of Systems Pertinent items noted in HPI and remainder of comprehensive ROS otherwise negative.   Objective:    BP (!) 169/82   Pulse 72   Wt 171 lb 12.8 oz (77.9 kg)   LMP 03/24/2013   BMI 29.49 kg/m  General appearance: alert, cooperative and appears stated age Head: Normocephalic, without obvious abnormality, atraumatic Neck: no adenopathy, supple, symmetrical, trachea midline and thyroid not enlarged, symmetric, no tenderness/mass/nodules Lungs: clear to auscultation bilaterally Breasts: normal appearance, no masses or tenderness Heart: regular rate and rhythm, S1, S2 normal, no murmur, click, rub or gallop Abdomen: soft, non-tender; bowel sounds normal; no masses,  no organomegaly Extremities: extremities normal, atraumatic, no cyanosis or edema Pulses: 2+ and symmetric Skin: Skin color, texture, turgor normal. No rashes or lesions Lymph nodes: Cervical, supraclavicular, and axillary nodes normal. Neurologic: Grossly normal    Assessment:    Healthy female exam.      Plan:      Problem List Items Addressed This Visit      Unprioritized   MENOPAUSAL SYNDROME    Advised lifestyle changes and layering. Not a great candidate for HRT.       Other Visit Diagnoses    Encounter for gynecological examination without abnormal finding    -  Primary   Relevant Orders   MM DIGITAL SCREENING BILATERAL     .Return in 1 year (on 06/18/2020).  See After Visit Summary for Counseling Recommendations

## 2019-06-19 NOTE — Assessment & Plan Note (Signed)
Advised lifestyle changes and layering. Not a great candidate for HRT.

## 2019-06-19 NOTE — Patient Instructions (Addendum)
Preventive Care 40-55 Years Old, Female Preventive care refers to visits with your health care provider and lifestyle choices that can promote health and wellness. This includes:  A yearly physical exam. This may also be called an annual well check.  Regular dental visits and eye exams.  Immunizations.  Screening for certain conditions.  Healthy lifestyle choices, such as eating a healthy diet, getting regular exercise, not using drugs or products that contain nicotine and tobacco, and limiting alcohol use. What can I expect for my preventive care visit? Physical exam Your health care provider will check your:  Height and weight. This may be used to calculate body mass index (BMI), which tells if you are at a healthy weight.  Heart rate and blood pressure.  Skin for abnormal spots. Counseling Your health care provider may ask you questions about your:  Alcohol, tobacco, and drug use.  Emotional well-being.  Home and relationship well-being.  Sexual activity.  Eating habits.  Work and work environment.  Method of birth control.  Menstrual cycle.  Pregnancy history. What immunizations do I need?  Influenza (flu) vaccine  This is recommended every year. Tetanus, diphtheria, and pertussis (Tdap) vaccine  You may need a Td booster every 10 years. Varicella (chickenpox) vaccine  You may need this if you have not been vaccinated. Zoster (shingles) vaccine  You may need this after age 60. Measles, mumps, and rubella (MMR) vaccine  You may need at least one dose of MMR if you were born in 1957 or later. You may also need a second dose. Pneumococcal conjugate (PCV13) vaccine  You may need this if you have certain conditions and were not previously vaccinated. Pneumococcal polysaccharide (PPSV23) vaccine  You may need one or two doses if you smoke cigarettes or if you have certain conditions. Meningococcal conjugate (MenACWY) vaccine  You may need this if you  have certain conditions. Hepatitis A vaccine  You may need this if you have certain conditions or if you travel or work in places where you may be exposed to hepatitis A. Hepatitis B vaccine  You may need this if you have certain conditions or if you travel or work in places where you may be exposed to hepatitis B. Haemophilus influenzae type b (Hib) vaccine  You may need this if you have certain conditions. Human papillomavirus (HPV) vaccine  If recommended by your health care provider, you may need three doses over 6 months. You may receive vaccines as individual doses or as more than one vaccine together in one shot (combination vaccines). Talk with your health care provider about the risks and benefits of combination vaccines. What tests do I need? Blood tests  Lipid and cholesterol levels. These may be checked every 5 years, or more frequently if you are over 50 years old.  Hepatitis C test.  Hepatitis B test. Screening  Lung cancer screening. You may have this screening every year starting at age 55 if you have a 30-pack-year history of smoking and currently smoke or have quit within the past 15 years.  Colorectal cancer screening. All adults should have this screening starting at age 50 and continuing until age 75. Your health care provider may recommend screening at age 45 if you are at increased risk. You will have tests every 1-10 years, depending on your results and the type of screening test.  Diabetes screening. This is done by checking your blood sugar (glucose) after you have not eaten for a while (fasting). You may have this   done every 1-3 years.  Mammogram. This may be done every 1-2 years. Talk with your health care provider about when you should start having regular mammograms. This may depend on whether you have a family history of breast cancer.  BRCA-related cancer screening. This may be done if you have a family history of breast, ovarian, tubal, or peritoneal  cancers.  Pelvic exam and Pap test. This may be done every 3 years starting at age 55. Starting at age 102, this may be done every 5 years if you have a Pap test in combination with an HPV test. Other tests  Sexually transmitted disease (STD) testing.  Bone density scan. This is done to screen for osteoporosis. You may have this scan if you are at high risk for osteoporosis. Follow these instructions at home: Eating and drinking  Eat a diet that includes fresh fruits and vegetables, whole grains, lean protein, and low-fat dairy.  Take vitamin and mineral supplements as recommended by your health care provider.  Do not drink alcohol if: ? Your health care provider tells you not to drink. ? You are pregnant, may be pregnant, or are planning to become pregnant.  If you drink alcohol: ? Limit how much you have to 0-1 drink a day. ? Be aware of how much alcohol is in your drink. In the [place] one drink equals one 12 oz bottle of beer (355 mL), one 5 oz glass of wine (148 mL), or one 1 oz glass of hard liquor (44 mL). Lifestyle  Take daily care of your teeth and gums.  Stay active. Exercise for at least 30 minutes on 5 or more days each week.  Do not use any products that contain nicotine or tobacco, such as cigarettes, e-cigarettes, and chewing tobacco. If you need help quitting, ask your health care provider.  If you are sexually active, practice safe sex. Use a condom or other form of birth control (contraception) in order to prevent pregnancy and STIs (sexually transmitted infections).  If told by your health care provider, take low-dose aspirin daily starting at age 55. What's next?  Visit your health care provider once a year for a well check visit.  Ask your health care provider how often you should have your eyes and teeth checked.  Stay up to date on all vaccines. This information is not intended to replace advice given to you by your health care provider. Make sure you  discuss any questions you have with your health care provider. Document Revised: 11/18/2017 Document Reviewed: 11/18/2017 Elsevier Patient Education  2020 [place].  Menopause Menopause is the normal time of life when menstrual periods stop completely. It is usually confirmed by 12 months without a menstrual period. The transition to menopause (perimenopause) most often happens between the ages of 60 and 28. During perimenopause, hormone levels change in your body, which can cause symptoms and affect your health. Menopause may increase your risk for:  Loss of bone (osteoporosis), which causes bone breaks (fractures).  Depression.  Hardening and narrowing of the arteries (atherosclerosis), which can cause heart attacks and strokes. What are the causes? This condition is usually caused by a natural change in hormone levels that happens as you get older. The condition may also be caused by surgery to remove both ovaries (bilateral oophorectomy). What increases the risk? This condition is more likely to start at an earlier age if you have certain medical conditions or treatments, including:  A tumor of the pituitary gland in the  brain.  A disease that affects the ovaries and hormone production.  Radiation treatment for cancer.  Certain cancer treatments, such as chemotherapy or hormone (anti-estrogen) therapy.  Heavy smoking and excessive alcohol use.  Family history of early menopause. This condition is also more likely to develop earlier in women who are very thin. What are the signs or symptoms? Symptoms of this condition include:  Hot flashes.  Irregular menstrual periods.  Night sweats.  Changes in feelings about sex. This could be a decrease in sex drive or an increased comfort around your sexuality.  Vaginal dryness and thinning of the vaginal walls. This may cause painful intercourse.  Dryness of the skin and development of wrinkles.  Headaches.  Problems  sleeping (insomnia).  Mood swings or irritability.  Memory problems.  Weight gain.  Hair growth on the face and chest.  Bladder infections or problems with urinating. How is this diagnosed? This condition is diagnosed based on your medical history, a physical exam, your age, your menstrual history, and your symptoms. Hormone tests may also be done. How is this treated? In some cases, no treatment is needed. You and your health care provider should make a decision together about whether treatment is necessary. Treatment will be based on your individual condition and preferences. Treatment for this condition focuses on managing symptoms. Treatment may include:  Menopausal hormone therapy (MHT).  Medicines to treat specific symptoms or complications.  Acupuncture.  Vitamin or herbal supplements. Before starting treatment, make sure to let your health care provider know if you have a personal or family history of:  Heart disease.  Breast cancer.  Blood clots.  Diabetes.  Osteoporosis. Follow these instructions at home: Lifestyle  Do not use any products that contain nicotine or tobacco, such as cigarettes and e-cigarettes. If you need help quitting, ask your health care provider.  Get at least 30 minutes of physical activity on 5 or more days each week.  Avoid alcoholic and caffeinated beverages, as well as spicy foods. This may help prevent hot flashes.  Get 7-8 hours of sleep each night.  If you have hot flashes, try: ? Dressing in layers. ? Avoiding things that may trigger hot flashes, such as spicy food, warm places, or stress. ? Taking slow, deep breaths when a hot flash starts. ? Keeping a fan in your home and office.  Find ways to manage stress, such as deep breathing, meditation, or journaling.  Consider going to group therapy with other women who are having menopause symptoms. Ask your health care provider about recommended group therapy meetings. Eating and  drinking  Eat a healthy, balanced diet that contains whole grains, lean protein, low-fat dairy, and plenty of fruits and vegetables.  Your health care provider may recommend adding more soy to your diet. Foods that contain soy include tofu, tempeh, and soy milk.  Eat plenty of foods that contain calcium and vitamin D for bone health. Items that are rich in calcium include low-fat milk, yogurt, beans, almonds, sardines, broccoli, and kale. Medicines  Take over-the-counter and prescription medicines only as told by your health care provider.  Talk with your health care provider before starting any herbal supplements. If prescribed, take vitamins and supplements as told by your health care provider. These may include: ? Calcium. Women age 63 and older should get 1,200 mg (milligrams) of calcium every day. ? Vitamin D. Women need 600-800 International Units of vitamin D each day. ? Vitamins B12 and B6. Aim for 50 micrograms of  B12 and 1.5 mg of B6 each day. General instructions  Keep track of your menstrual periods, including: ? When they occur. ? How heavy they are and how long they last. ? How much time passes between periods.  Keep track of your symptoms, noting when they start, how often you have them, and how long they last.  Use vaginal lubricants or moisturizers to help with vaginal dryness and improve comfort during sex.  Keep all follow-up visits as told by your health care provider. This is important. This includes any group therapy or counseling. Contact a health care provider if:  You are still having menstrual periods after age 25.  You have pain during sex.  You have not had a period for 12 months and you develop vaginal bleeding. Get help right away if:  You have: ? Severe depression. ? Excessive vaginal bleeding. ? Pain when you urinate. ? A fast or irregular heart beat (palpitations). ? Severe headaches. ? Abdomen (abdominal) pain or severe indigestion.  You  fell and you think you have a broken bone.  You develop leg or chest pain.  You develop vision problems.  You feel a lump in your breast. Summary  Menopause is the normal time of life when menstrual periods stop completely. It is usually confirmed by 12 months without a menstrual period.  The transition to menopause (perimenopause) most often happens between the ages of 37 and 53.  Symptoms can be managed through medicines, lifestyle changes, and complementary therapies such as acupuncture.  Eat a balanced diet that is rich in nutrients to promote bone health and heart health and to manage symptoms during menopause. This information is not intended to replace advice given to you by your health care provider. Make sure you discuss any questions you have with your health care provider. Document Revised: 02/19/2017 Document Reviewed: 04/11/2016 Elsevier Patient Education  2020 [employer].

## 2019-06-20 DIAGNOSIS — M7701 Medial epicondylitis, right elbow: Secondary | ICD-10-CM | POA: Diagnosis not present

## 2019-06-20 DIAGNOSIS — G5601 Carpal tunnel syndrome, right upper limb: Secondary | ICD-10-CM | POA: Diagnosis not present

## 2019-07-06 ENCOUNTER — Other Ambulatory Visit: Payer: Self-pay

## 2019-07-06 NOTE — Telephone Encounter (Signed)
Received fax from [facility] for Lexapro refill. Patient had appointment with Debbie on 06/14/19 for establish care but do not see that this medication or diagnoses was addressed. Please review. Last filled by Dr Deborra Medina

## 2019-07-07 MED ORDER — ESCITALOPRAM OXALATE 10 MG PO TABS: 10.0000 mg | ORAL_TABLET | Freq: Every day | ORAL | 3 refills | Status: DC

## 2019-08-12 LAB — HM DIABETES EYE EXAM

## 2019-09-07 DIAGNOSIS — M654 Radial styloid tenosynovitis [de Quervain]: Secondary | ICD-10-CM | POA: Diagnosis not present

## 2019-09-11 ENCOUNTER — Ambulatory Visit
Admission: RE | Admit: 2019-09-11 | Discharge: 2019-09-11 | Disposition: A | Discharge status: Home / Self Care | Payer: BC Managed Care – PPO | Source: Ambulatory Visit | Attending: Family Medicine | Admitting: Family Medicine

## 2019-09-11 DIAGNOSIS — Z1231 Encounter for screening mammogram for malignant neoplasm of breast: Secondary | ICD-10-CM | POA: Insufficient documentation

## 2019-09-11 DIAGNOSIS — Z01419 Encounter for gynecological examination (general) (routine) without abnormal findings: Secondary | ICD-10-CM | POA: Diagnosis not present

## 2019-10-10 ENCOUNTER — Other Ambulatory Visit: Payer: Self-pay | Admitting: Family Medicine

## 2019-10-10 NOTE — Telephone Encounter (Signed)
Last OV TOC 06/14/19  Last refill 10/04/2018 #180 x 3 refills  Upcoming OV With Family Medicine Elby Beck, FNP) 10/13/2019 at 4:00 PM  Please advise, thanks.

## 2019-10-13 ENCOUNTER — Ambulatory Visit: Payer: BC Managed Care – PPO | Admitting: Family Medicine

## 2019-10-13 ENCOUNTER — Other Ambulatory Visit: Payer: Self-pay

## 2019-10-13 ENCOUNTER — Encounter: Payer: Self-pay | Admitting: Family Medicine

## 2019-10-13 VITALS — BP 138/82 | HR 77 | Temp 97.5°F | Ht 64.0 in | Wt 172.4 lb

## 2019-10-13 DIAGNOSIS — E119 Type 2 diabetes mellitus without complications: Secondary | ICD-10-CM | POA: Diagnosis not present

## 2019-10-13 LAB — POCT GLYCOSYLATED HEMOGLOBIN (HGB A1C): Hemoglobin A1C: 7.1 % — AB (ref 4.0–5.6)

## 2019-10-13 NOTE — Patient Instructions (Addendum)
Good to see you today  [email or web address]  Youtube- Dr. Enrigue Catena, Dr. Luis Abed  Continue to decrease your starches and sugars, increase your proteins and vegetables  Follow up in 6 months for your complete physical exam

## 2019-10-13 NOTE — Progress Notes (Signed)
   Subjective:    Patient ID: Hannah Rocha, female    DOB: July 01, 1965, 54 y.o.   MRN: [id number]  HPI Chief Complaint  Patient presents with  . Follow-up    A1c check. no new concerns   This is a 54 yo female who presents today for diabetes mellitus type 2 follow up. Has not been eating as many sweets lately. Breakfast is whole wheat english muffin, [place] sausage, Lunch- lettuce wraps with chicken salad, tomato, small ear corn, Snack- apple/ blueberries, low carb sweet, smoothies, Dinner- meat, vegetables. She brings in her blood sugar log and most readings in low to mid 100s. One reading slightly over 200.    Review of Systems Denies chest pain, SOB, abdominal pain/ diarrhea/ constipation, leg swelling.     Objective:   Physical Exam Physical Exam  Constitutional: Oriented to person, place, and time. She appears well-developed and well-nourished.  HENT:  Head: Normocephalic and atraumatic.  Eyes: Conjunctivae are normal.  Neck: Normal range of motion. Neck supple.  Cardiovascular: Normal rate, regular rhythm and normal heart sounds.   Pulmonary/Chest: Effort normal and breath sounds normal.  Musculoskeletal: Normal range of motion.  Neurological: Alert and oriented to person, place, and time.  Skin: Skin is warm and dry.  Psychiatric: Normal mood and affect. Behavior is normal. Judgment and thought content normal.  Vitals reviewed.     BP (!) 138/82 (BP Location: Left Arm, Patient Position: Sitting, Cuff Size: Normal)   Pulse 77   Temp (!) 97.5 F (36.4 C) (Temporal)   Ht 5\' 4"  (1.626 m)   Wt 172 lb 6.4 oz (78.2 kg)   LMP 03/24/2013   SpO2 99%   BMI 29.59 kg/m  Wt Readings from Last 3 Encounters:  10/13/19 172 lb 6.4 oz (78.2 kg)  06/19/19 171 lb 12.8 oz (77.9 kg)  06/14/19 170 lb (77.1 kg)   Diabetic Foot Exam - Simple   Simple Foot Form Diabetic Foot exam was performed with the following findings: Yes 10/13/2019  4:44 PM  Visual Inspection No  deformities, no ulcerations, no other skin breakdown bilaterally: Yes Sensation Testing Intact to touch and monofilament testing bilaterally: Yes Pulse Check Posterior Tibialis and Dorsalis pulse intact bilaterally: Yes Comments     Results for orders placed or performed in visit on 10/13/19  HgB A1c  Result Value Ref Range   Hemoglobin A1C 7.1 (A) 4.0 - 5.6 %   HbA1c POC (<> result, manual entry)     HbA1c, POC (prediabetic range)     HbA1c, POC (controlled diabetic range)         Assessment & Plan:  1. Type 2 diabetes mellitus without complication, without long-term current use of insulin (HCC) - slightly increased hgba1c from 6.9 to 7.1. Discussed diet in detail and provided resources - follow up in 6 months for CPE/ labs - HgB A1c  This visit occurred during the SARS-CoV-2 public health emergency.  Safety protocols were in place, including screening questions prior to the visit, additional usage of staff PPE, and extensive cleaning of exam room while observing appropriate contact time as indicated for disinfecting solutions.    Clarene Reamer, FNP-BC  [facility], [facility]  10/16/2019 8:54 AM

## 2019-10-19 ENCOUNTER — Encounter: Payer: Self-pay | Admitting: Family Medicine

## 2019-12-15 ENCOUNTER — Other Ambulatory Visit: Payer: Self-pay | Admitting: Gastroenterology

## 2019-12-27 ENCOUNTER — Other Ambulatory Visit: Payer: Self-pay

## 2019-12-27 MED ORDER — METFORMIN HCL 500 MG PO TABS: ORAL_TABLET | ORAL | 3 refills | Status: DC

## 2020-01-12 ENCOUNTER — Other Ambulatory Visit: Payer: Self-pay | Admitting: Gastroenterology

## 2020-01-13 ENCOUNTER — Ambulatory Visit: Payer: BC Managed Care – PPO

## 2020-01-13 ENCOUNTER — Other Ambulatory Visit: Payer: Self-pay

## 2020-01-13 ENCOUNTER — Ambulatory Visit (INDEPENDENT_AMBULATORY_CARE_PROVIDER_SITE_OTHER): Payer: BC Managed Care – PPO

## 2020-01-13 DIAGNOSIS — Z23 Encounter for immunization: Secondary | ICD-10-CM | POA: Diagnosis not present

## 2020-02-06 ENCOUNTER — Encounter: Payer: Self-pay | Admitting: Gastroenterology

## 2020-02-06 ENCOUNTER — Ambulatory Visit: Payer: BC Managed Care – PPO | Admitting: Gastroenterology

## 2020-02-06 ENCOUNTER — Other Ambulatory Visit: Payer: Self-pay | Admitting: Gastroenterology

## 2020-02-06 VITALS — BP 148/78 | HR 72 | Ht 64.0 in | Wt 173.0 lb

## 2020-02-06 DIAGNOSIS — Z86010 Personal history of colonic polyps: Secondary | ICD-10-CM

## 2020-02-06 DIAGNOSIS — K219 Gastro-esophageal reflux disease without esophagitis: Secondary | ICD-10-CM

## 2020-02-06 DIAGNOSIS — R194 Change in bowel habit: Secondary | ICD-10-CM

## 2020-02-06 MED ORDER — PANTOPRAZOLE SODIUM 40 MG PO TBEC: 40.0000 mg | DELAYED_RELEASE_TABLET | Freq: Every day | ORAL | 11 refills | Status: DC

## 2020-02-06 NOTE — Patient Instructions (Signed)
We have sent the following medications to your pharmacy for you to pick up at your convenience: pantoprazole.   Thank you for choosing me and [facility].  Malcolm T. Stark, Jr., MD., FACG   

## 2020-02-06 NOTE — Progress Notes (Signed)
    History of Present Illness: This is a 54 year old female returning for follow-up of GERD.  Her symptoms are well controlled on daily pantoprazole and antireflux measures.  She notes a slight variation in bowel habits with normal stools occasional loose stools and occasional mild constipation.  No particular pattern to the symptoms.  She underwent colonoscopy in October 2019 with 1 colon polyp (tubular adenoma), hemorrhoids, mild melanosis noted.  Current Medications, Allergies, Past Medical History, Past Surgical History, Family History and Social History were reviewed in [employer] record.   Physical Exam: General: Well developed, well nourished, no acute distress Head: Normocephalic and atraumatic Eyes:  sclerae anicteric, EOMI Ears: Normal auditory acuity Mouth: Not examined, mask on during Covid-19 pandemic Lungs: Clear throughout to auscultation Heart: Regular rate and rhythm; no murmurs, rubs or bruits Abdomen: Soft, non tender and non distended. No masses, hepatosplenomegaly or hernias noted. Normal Bowel sounds Rectal: Not done Musculoskeletal: Symmetrical with no gross deformities  Pulses:  Normal pulses noted Extremities: No clubbing, cyanosis, edema or deformities noted Neurological: Alert oriented x 4, grossly nonfocal Psychological:  Alert and cooperative. Normal mood and affect   Assessment and Recommendations:  1. GERD.  Follow antireflux measures.  Continue pantoprazole 40 mg qd. REV in 1 year.   2.  Variation in bowel habits.  Recent colonoscopy reviewed.  Adequate daily water and fiber intake.  Assess foods that may be triggering symptoms.    3.  Personal history of adenomatous colon polyps, family history of colon polyps in first-degree relative.  A 5-year interval colonoscopy is recommended in October 2024.

## 2020-02-26 ENCOUNTER — Telehealth: Payer: Self-pay | Admitting: Family Medicine

## 2020-02-26 NOTE — Telephone Encounter (Signed)
Pt called in wanted to see about doing to [facility] to [employer] from [employer].

## 2020-02-26 NOTE — Telephone Encounter (Signed)
Sure, no problem. Happy to take her, thank you for asking.

## 2020-04-09 ENCOUNTER — Encounter: Payer: Self-pay | Admitting: Radiology

## 2020-04-10 ENCOUNTER — Ambulatory Visit: Payer: BC Managed Care – PPO | Admitting: Primary Care

## 2020-04-16 ENCOUNTER — Other Ambulatory Visit: Payer: Self-pay

## 2020-04-16 ENCOUNTER — Ambulatory Visit: Payer: BC Managed Care – PPO | Admitting: Primary Care

## 2020-04-16 ENCOUNTER — Encounter: Payer: Self-pay | Admitting: Primary Care

## 2020-04-16 VITALS — BP 138/74 | HR 73 | Temp 97.6°F | Ht 64.0 in | Wt 172.0 lb

## 2020-04-16 DIAGNOSIS — Q211 Atrial septal defect: Secondary | ICD-10-CM | POA: Diagnosis not present

## 2020-04-16 DIAGNOSIS — E559 Vitamin D deficiency, unspecified: Secondary | ICD-10-CM | POA: Diagnosis not present

## 2020-04-16 DIAGNOSIS — E119 Type 2 diabetes mellitus without complications: Secondary | ICD-10-CM | POA: Diagnosis not present

## 2020-04-16 DIAGNOSIS — E785 Hyperlipidemia, unspecified: Secondary | ICD-10-CM | POA: Diagnosis not present

## 2020-04-16 DIAGNOSIS — F411 Generalized anxiety disorder: Secondary | ICD-10-CM

## 2020-04-16 DIAGNOSIS — D509 Iron deficiency anemia, unspecified: Secondary | ICD-10-CM

## 2020-04-16 DIAGNOSIS — Q2111 Secundum atrial septal defect: Secondary | ICD-10-CM

## 2020-04-16 LAB — POCT GLYCOSYLATED HEMOGLOBIN (HGB A1C): Hemoglobin A1C: 6.6 % — AB (ref 4.0–5.6)

## 2020-04-16 LAB — COMPREHENSIVE METABOLIC PANEL
ALT: 16 U/L (ref 0–35)
AST: 13 U/L (ref 0–37)
Albumin: 4.6 g/dL (ref 3.5–5.2)
Alkaline Phosphatase: 89 U/L (ref 39–117)
BUN: 16 mg/dL (ref 6–23)
CO2: 29 mEq/L (ref 19–32)
Calcium: 9.3 mg/dL (ref 8.4–10.5)
Chloride: 101 mEq/L (ref 96–112)
Creatinine, Ser: 0.68 mg/dL (ref 0.40–1.20)
GFR: 97.64 mL/min (ref >60.00)
Glucose, Bld: 116 mg/dL — ABNORMAL HIGH (ref 70–99)
Potassium: 4.7 mEq/L (ref 3.5–5.1)
Sodium: 136 mEq/L (ref 135–145)
Total Bilirubin: 0.4 mg/dL (ref 0.2–1.2)
Total Protein: 7.1 g/dL (ref 6.0–8.3)

## 2020-04-16 LAB — MICROALBUMIN / CREATININE URINE RATIO
Creatinine,U: 58.4 mg/dL
Microalb Creat Ratio: 1.2 mg/g (ref 0.0–30.0)
Microalb, Ur: <0.7 mg/dL (ref 0.0–1.9)

## 2020-04-16 LAB — LIPID PANEL
Cholesterol: 223 mg/dL — ABNORMAL HIGH (ref 0–200)
HDL: 45.9 mg/dL (ref >39.00)
LDL Cholesterol: 158 mg/dL — ABNORMAL HIGH (ref 0–99)
NonHDL: 176.98
Total CHOL/HDL Ratio: 5
Triglycerides: 93 mg/dL (ref 0.0–149.0)
VLDL: 18.6 mg/dL (ref 0.0–40.0)

## 2020-04-16 LAB — CBC
HCT: 38.2 % (ref 36.0–46.0)
Hemoglobin: 12.8 g/dL (ref 12.0–15.0)
MCHC: 33.6 g/dL (ref 30.0–36.0)
MCV: 91.4 fl (ref 78.0–100.0)
Platelets: 198 10*3/uL (ref 150.0–400.0)
RBC: 4.19 Mil/uL (ref 3.87–5.11)
RDW: 13.1 % (ref 11.5–15.5)
WBC: 5.8 10*3/uL (ref 4.0–10.5)

## 2020-04-16 LAB — TSH: TSH: 3.68 u[IU]/mL (ref 0.35–4.50)

## 2020-04-16 LAB — VITAMIN D 25 HYDROXY (VIT D DEFICIENCY, FRACTURES): VITD: 34.34 ng/mL (ref 30.00–100.00)

## 2020-04-16 MED ORDER — ESCITALOPRAM OXALATE 10 MG PO TABS: 10.0000 mg | ORAL_TABLET | Freq: Every day | ORAL | 3 refills | Status: DC

## 2020-04-16 NOTE — Assessment & Plan Note (Signed)
Doing well on Lexapro 10 mg and Buspar 15-30 mg TID. Continue same. Refills provided.

## 2020-04-16 NOTE — Assessment & Plan Note (Signed)
Repeat vitamin D pending. 

## 2020-04-16 NOTE — Patient Instructions (Addendum)
Stop by the lab prior to leaving today. I will notify you of your results once received.   Continue to work on [employer]. Ensure you are consuming 64 ounces of water daily.  Start exercising. You should be getting 150 minutes of moderate intensity exercise weekly.  Please schedule a follow up appointment in 6 months for your annual physical.   It was a pleasure meeting you!

## 2020-04-16 NOTE — Assessment & Plan Note (Signed)
Prior history, repeat CBC pending.

## 2020-04-16 NOTE — Assessment & Plan Note (Signed)
Doing well on pravastatin 10 mg, no history of myalgias with statin therapy. Repeat lipids pending. Will send refills once labs return.

## 2020-04-16 NOTE — Assessment & Plan Note (Addendum)
  History of ASD repair in 1999. No problems since.  No murmur noted on exam

## 2020-04-16 NOTE — Progress Notes (Signed)
Subjective:    Patient ID: Hannah Rocha, female    DOB: 1965-04-17, 55 y.o.   MRN: [id number]  HPI  This visit occurred during the SARS-CoV-2 public health emergency.  Safety protocols were in place, including screening questions prior to the visit, additional usage of staff PPE, and extensive cleaning of exam room while observing appropriate contact time as indicated for disinfecting solutions.   Ms. Cutright is a 55 year old female patient of [facility] who presents today to transfer care. She follows with Dr. Kennon Rounds for GYN.   1) Type 2 Diabetes: Diagnosed in 2018  Current medications include: Metformin 500 mg once daily  She is checking her blood glucose 1-2 times daily and is getting readings of:  AM fasting low to mid 100's  She is active for the most part, walking daily. She does plan on improving her diet.   Last A1C: 7.1 in July, 6.6 today. Last Eye Exam: Completed in May 2021 Last Foot Exam: UTD Pneumonia Vaccination: Completed in 2019 ACE/ARB: None. Due.  Statin: Pravastatin 10 mg.   2) GERD: Currently managed on pantoprazole 40 mg daily. Overall feels well managed on this regimen. Has not tried a lower dose. Follows with Dr. Fuller Plan.   3) Anxiety and Depression: Currently managed on Buspar 30 mg in AM 15 mg at lunch, 15 mg HS, Lexapro 10 mg. Overall does very well on this regimen.   Review of Systems  Eyes: Negative for visual disturbance.  Respiratory: Negative for shortness of breath.   Cardiovascular: Negative for chest pain.  Neurological: Negative for dizziness and headaches.       Occasional tingling to fingertips, history of plantar fasciitis to feet       Past Medical History:  Diagnosis Date  . Allergy   . Anemia    hx  . Anxiety   . Cataract    FLOATING CATARACT   . Diabetes mellitus without complication ([place])   . GERD (gastroesophageal reflux disease)   . H/O hiatal hernia   . Heart murmur    ASD repair 1999- no meds   . HIATAL  HERNIA 07/09/2008   Qualifier: Diagnosis of  By: Nils Pyle CMA ([place]), Mearl Latin    . Hx of dysplastic nevus    multiple sites, some severe  . Hyperlipidemia   . IBS 06/21/2009   Qualifier: Diagnosis of  By: Sharlett Iles MD Byrd Hesselbach Obesity   . PONV (postoperative nausea and vomiting)   . 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   Annotation: REPAIRED AS CHILD Qualifier: Diagnosis of  By: Council Mechanic MD, Hilaria Ota      Social History   Socioeconomic History  . Marital status: Married    Spouse name: Not on file  . Number of children: 0  . Years of education: Not on file  . Highest education level: Not on file  Occupational History  . 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    Employer: [employer], [employer]  Tobacco Use  . Smoking status: Former Smoker    Packs/day: 0.00    Years: 0.00    Pack years: 0.00    Types: Cigarettes    Quit date: 02/21/1987    Years since quitting: 33.1  . Smokeless tobacco: Never Used  . Tobacco comment: 22 years ago-light smoker  Vaping Use  . Vaping Use: Never used  Substance and Sexual Activity  . Alcohol use: Yes    Alcohol/week: 0.0 standard drinks    Comment:  occasional - hard apple cider, shot  . Drug use: No  . Sexual activity: Yes    Partners: Male    Birth control/protection: Surgical    Comment: tubaligation/hysterctomy  Other Topics Concern  . Not on file  Social History Narrative   Daily caffeine use   Social Determinants of Health   Financial Resource Strain: Not on file  Food Insecurity: Not on file  Transportation Needs: Not on file  Physical Activity: Not on file  Stress: Not on file  Social Connections: Not on file  Intimate Partner Violence: Not on file    Past Surgical History:  Procedure Laterality Date  . ASD REPAIR  1999   [facility]  . BREAST CYST ASPIRATION Right 2013  . CLEFT LIP REPAIR    . COLONOSCOPY    . LAPAROSCOPIC ASSISTED VAGINAL HYSTERECTOMY Bilateral 04/13/2013   Procedure: LAPAROSCOPIC  ASSISTED VAGINAL HYSTERECTOMY;  Surgeon: Emily Filbert, MD;  Location: [facility];  Service: Gynecology;  Laterality: Bilateral;- pt states was abdominal hysterectomy   . LAPAROSCOPIC TUBAL LIGATION  03/21/2012   Procedure: LAPAROSCOPIC TUBAL LIGATION;  Surgeon: Emily Filbert, MD;  Location: [facility];  Service: Gynecology;  Laterality: Bilateral;  . MANDIBLE FRACTURE SURGERY    . Plantar Fascitis Repair Bilateral 01/2015    Family History  Problem Relation Age of Onset  . Heart disease Mother   . Colon polyps Mother   . Diabetes Mother   . Prostate cancer Father   . Heart disease Father   . Cancer Father        prostate  . Diabetes Maternal Aunt   . Diabetes Paternal Grandmother   . Fibromyalgia Sister   . Colon cancer Neg Hx   . Breast cancer Neg Hx   . Esophageal cancer Neg Hx   . Rectal cancer Neg Hx   . Stomach cancer Neg Hx     Allergies  Allergen Reactions  . Sertraline Hcl     REACTION: vomiting, severe anxiety    Current Outpatient Medications on File Prior to Visit  Medication Sig Dispense Refill  . acetaminophen (TYLENOL) 500 MG tablet Take 500 mg by mouth every 6 (six) hours as needed for mild pain.     . B Complex Vitamins (VITAMIN B COMPLEX PO) Take 1 tablet by mouth daily.    . busPIRone (BUSPAR) 30 MG tablet TAKE 1 TABLET BY MOUTH EVERY MORNING 1/2 TAB AT LUNCH, AND 1/2 TAB AT BEDTIME 180 tablet 3  . flintstones complete (FLINTSTONES) 60 MG chewable tablet Chew 1 tablet by mouth daily.    Marland Kitchen glucose 4 GM chewable tablet Chew 1 tablet (4 g total) by mouth as needed for low blood sugar. 50 tablet 12  . glucose blood (ONETOUCH VERIO) test strip Use as instructed to test blood sugar twice daily e11.9 100 each 12  . ibuprofen (ADVIL) 800 MG tablet Take 1 tablet (800 mg total) by mouth every 8 (eight) hours as needed. 60 tablet 6  . metFORMIN (GLUCOPHAGE) 500 MG tablet TAKE 1 TABLET BY MOUTH EVERY DAY WITH BREAKFAST 90 tablet 3  . ONETOUCH DELICA LANCETS 99991111 MISC Use to test  blood sugar once daily E11.9 100 each 1  . pantoprazole (PROTONIX) 40 MG tablet Take 1 tablet (40 mg total) by mouth daily. 30 tablet 11  . pravastatin (PRAVACHOL) 10 MG tablet TAKE 1 TABLET BY MOUTH EVERY DAY 90 tablet 3   No current facility-administered medications on file prior to visit.    BP  138/74   Pulse 73   Temp 97.6 F (36.4 C) (Temporal)   Ht 5\' 4"  (1.626 m)   Wt 172 lb (78 kg)   LMP 03/24/2013   SpO2 99%   BMI 29.52 kg/m    Objective:   Physical Exam Constitutional:      Appearance: She is well-nourished.  Cardiovascular:     Rate and Rhythm: Normal rate and regular rhythm.  Pulmonary:     Effort: Pulmonary effort is normal.     Breath sounds: Normal breath sounds.  Musculoskeletal:     Cervical back: Neck supple.  Skin:    General: Skin is warm and dry.  Psychiatric:        Mood and Affect: Mood and affect normal.            Assessment & Plan:

## 2020-04-17 DIAGNOSIS — E785 Hyperlipidemia, unspecified: Secondary | ICD-10-CM

## 2020-04-22 MED ORDER — ROSUVASTATIN CALCIUM 5 MG PO TABS: 5.0000 mg | ORAL_TABLET | Freq: Every day | ORAL | 3 refills | Status: DC

## 2020-04-22 NOTE — Telephone Encounter (Signed)
Spoke to pt. She would like to make the change to rosuvastatin 5mg . Please send to [facility].

## 2020-05-03 ENCOUNTER — Other Ambulatory Visit: Payer: Self-pay

## 2020-05-03 ENCOUNTER — Ambulatory Visit: Payer: BC Managed Care – PPO | Admitting: Family Medicine

## 2020-05-03 ENCOUNTER — Encounter: Payer: Self-pay | Admitting: Family Medicine

## 2020-05-03 DIAGNOSIS — H6983 Other specified disorders of Eustachian tube, bilateral: Secondary | ICD-10-CM | POA: Diagnosis not present

## 2020-05-03 DIAGNOSIS — H6993 Unspecified Eustachian tube disorder, bilateral: Secondary | ICD-10-CM | POA: Insufficient documentation

## 2020-05-03 MED ORDER — FLUTICASONE PROPIONATE 50 MCG/ACT NA SUSP
2.0000 | Freq: Every day | NASAL | 2 refills | Status: DC
Start: 2020-05-03 — End: 2021-07-09

## 2020-05-03 NOTE — Assessment & Plan Note (Signed)
With pressure and occ pain in ears with some mild chronic congestion  Reassuring exam Px flonase to use bid for 7d then daily  Watch for worse ear pain or new symptoms  Update if not starting to improve in a week or if worsening

## 2020-05-03 NOTE — Patient Instructions (Signed)
I think you have some pressure in the ear (eustachian tube)   Use flonase twice daily for a week and then daily through allergy season  If not improved let us know   If new symptoms , let us know  Especially if pain returns or worsens in the ear

## 2020-05-03 NOTE — Progress Notes (Signed)
Subjective:    Patient ID: Hannah Rocha, female    DOB: 1965/08/15, 55 y.o.   MRN: [id number]  This visit occurred during the SARS-CoV-2 public health emergency.  Safety protocols were in place, including screening questions prior to the visit, additional usage of staff PPE, and extensive cleaning of exam room while observing appropriate contact time as indicated for disinfecting solutions.    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 with h/o ear fullness   Wt Readings from Last 3 Encounters:  05/03/20 174 lb 5 oz (79.1 kg)  04/16/20 172 lb (78 kg)  02/06/20 173 lb (78.5 kg)   29.92 kg/m  Pain in R ear and then her L ear  Some nausea early this week- and that is better  No dizziness  No fever  Some congestion   Today- ears feel stopped up  Not prone to ear infections as an adult   occ allergy symptoms    No facial pain or headache   Patient Active Problem List   Diagnosis Date Noted  . ETD (Eustachian tube dysfunction), bilateral 05/03/2020  . Right wrist pain 03/29/2019  . De Quervain's tenosynovitis, right 03/29/2019  . Plant allergic contact dermatitis 10/13/2018  . Well woman exam without gynecological exam 10/04/2018  . HLD (hyperlipidemia) 03/09/2016  . Diabetes ([place]) 03/09/2016  . Vitamin D deficiency 03/09/2016  . Varicose veins 12/28/2013  . Elevated blood pressure 04/04/2012  . GAD (generalized anxiety disorder) 04/04/2012  . GERD (gastroesophageal reflux disease) 01/23/2011  . ANEMIA-IRON DEFICIENCY 06/21/2009  . IRON DEFICIENCY ANEMIA, HX OF 07/10/2008  . ATRIAL SEPTAL DEFECT, SECUNDUM TYPE 05/03/2007   Past Medical History:  Diagnosis Date  . Allergy   . Anemia    hx  . Anxiety   . CARDIAC MURMUR, HX OF 05/03/2007   Annotation: ASD Qualifier: Diagnosis of  By: Council Mechanic MD, Hilaria Ota   . Cataract    FLOATING CATARACT   . Diabetes mellitus without complication ([place])   . GERD (gastroesophageal reflux disease)   . H/O hiatal hernia   . Heart murmur     ASD repair 1999- no meds   . HIATAL HERNIA 07/09/2008   Qualifier: Diagnosis of  By: Nils Pyle CMA ([place]), Mearl Latin    . Hx of dysplastic nevus    multiple sites, some severe  . Hyperlipidemia   . IBS 06/21/2009   Qualifier: Diagnosis of  By: Sharlett Iles MD FACG, [place] SYNDROME 01/15/2010   Qualifier: Diagnosis of  By: Deborra Medina MD, Tanja Port    . Obesity   . PONV (postoperative nausea and vomiting)   . 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 ASD REPAIR  1999   [facility]  . BREAST CYST ASPIRATION Right 2013  . CLEFT LIP REPAIR    . COLONOSCOPY    . LAPAROSCOPIC ASSISTED VAGINAL HYSTERECTOMY Bilateral 04/13/2013   Procedure: LAPAROSCOPIC ASSISTED VAGINAL HYSTERECTOMY;  Surgeon: Emily Filbert, MD;  Location: [facility];  Service: Gynecology;  Laterality: Bilateral;- pt states was abdominal hysterectomy   . LAPAROSCOPIC TUBAL LIGATION  03/21/2012   Procedure: LAPAROSCOPIC TUBAL LIGATION;  Surgeon: Emily Filbert, MD;  Location: [facility];  Service: Gynecology;  Laterality: Bilateral;  . MANDIBLE FRACTURE SURGERY    . Plantar Fascitis Repair Bilateral 01/2015   Social History   Tobacco Use  .  Smoking status: Former Smoker    Packs/day: 0.00    Years: 0.00    Pack years: 0.00    Types: Cigarettes    Quit date: 02/21/1987    Years since quitting: 33.2  . Smokeless tobacco: Never Used  . Tobacco comment: 22 years ago-light smoker  Vaping Use  . Vaping Use: Never used  Substance Use Topics  . Alcohol use: Yes    Alcohol/week: 0.0 standard drinks    Comment: occasional - hard apple cider, shot  . Drug use: No   Family History  Problem Relation Age of Onset  . Heart disease Mother   . Colon polyps Mother   . Diabetes Mother   . Prostate cancer Father   . Heart disease Father   . Cancer Father        prostate  . Diabetes Maternal  Aunt   . Diabetes Paternal Grandmother   . Fibromyalgia Sister   . Colon cancer Neg Hx   . Breast cancer Neg Hx   . Esophageal cancer Neg Hx   . Rectal cancer Neg Hx   . Stomach cancer Neg Hx    Allergies  Allergen Reactions  . Sertraline Hcl     REACTION: vomiting, severe anxiety   Current Outpatient Medications on File Prior to Visit  Medication Sig Dispense Refill  . acetaminophen (TYLENOL) 500 MG tablet Take 500 mg by mouth every 6 (six) hours as needed for mild pain.     . B Complex Vitamins (VITAMIN B COMPLEX PO) Take 1 tablet by mouth daily.    . busPIRone (BUSPAR) 30 MG tablet TAKE 1 TABLET BY MOUTH EVERY MORNING 1/2 TAB AT LUNCH, AND 1/2 TAB AT BEDTIME 180 tablet 3  . escitalopram (LEXAPRO) 10 MG tablet Take 1 tablet (10 mg total) by mouth daily. For anxiety. 90 tablet 3  . flintstones complete (FLINTSTONES) 60 MG chewable tablet Chew 1 tablet by mouth daily.    Marland Kitchen glucose 4 GM chewable tablet Chew 1 tablet (4 g total) by mouth as needed for low blood sugar. 50 tablet 12  . glucose blood (ONETOUCH VERIO) test strip Use as instructed to test blood sugar twice daily e11.9 100 each 12  . ibuprofen (ADVIL) 800 MG tablet Take 1 tablet (800 mg total) by mouth every 8 (eight) hours as needed. 60 tablet 6  . metFORMIN (GLUCOPHAGE) 500 MG tablet TAKE 1 TABLET BY MOUTH EVERY DAY WITH BREAKFAST 90 tablet 3  . ONETOUCH DELICA LANCETS 32R MISC Use to test blood sugar once daily E11.9 100 each 1  . pantoprazole (PROTONIX) 40 MG tablet Take 1 tablet (40 mg total) by mouth daily. 30 tablet 11  . rosuvastatin (CRESTOR) 5 MG tablet Take 1 tablet (5 mg total) by mouth daily. For cholesterol. 90 tablet 3   No current facility-administered medications on file prior to visit.     Review of Systems  Constitutional: Negative for activity change, appetite change, fatigue, fever and unexpected weight change.  HENT: Positive for congestion and ear pain. Negative for ear discharge, facial swelling,  hearing loss, rhinorrhea, sinus pressure, sore throat and tinnitus.   Eyes: Negative for pain, redness and visual disturbance.  Respiratory: Negative for cough, shortness of breath and wheezing.   Cardiovascular: Negative for chest pain and palpitations.  Gastrointestinal: Negative for abdominal pain, blood in stool, constipation and diarrhea.  Endocrine: Negative for polydipsia and polyuria.  Genitourinary: Negative for dysuria, frequency and urgency.  Musculoskeletal: Negative for arthralgias, back pain and  myalgias.  Skin: Negative for pallor and rash.  Allergic/Immunologic: Negative for environmental allergies.  Neurological: Negative for dizziness, syncope and headaches.  Hematological: Negative for adenopathy. Does not bruise/bleed easily.  Psychiatric/Behavioral: Negative for decreased concentration and dysphoric mood. The patient is not nervous/anxious.        Objective:   Physical Exam Vitals reviewed.  Constitutional:      General: She is not in acute distress.    Appearance: Normal appearance. She is not ill-appearing.  HENT:     Head: Normocephalic and atraumatic.     Comments: No facial swelling  Some sensitivity over frontal sinus area    Right Ear: Ear canal and external ear normal. There is no impacted cerumen.     Left Ear: Ear canal and external ear normal.     Ears:     Comments: R TM is dull with small effusion  L TM -retracted No erythema or bulging    Nose: Congestion present.     Mouth/Throat:     Mouth: Mucous membranes are moist.     Pharynx: No posterior oropharyngeal erythema.  Eyes:     General:        Right eye: No discharge.        Left eye: No discharge.     Conjunctiva/sclera: Conjunctivae normal.     Pupils: Pupils are equal, round, and reactive to light.  Cardiovascular:     Rate and Rhythm: Normal rate and regular rhythm.  Pulmonary:     Effort: Pulmonary effort is normal. No respiratory distress.     Breath sounds: Normal breath  sounds. No wheezing.  Musculoskeletal:     Cervical back: Neck supple. No tenderness.  Lymphadenopathy:     Cervical: No cervical adenopathy.  Skin:    General: Skin is warm and dry.     Coloration: Skin is not pale.     Findings: No rash.  Neurological:     Mental Status: She is alert.     Cranial Nerves: No cranial nerve deficit.  Psychiatric:        Mood and Affect: Mood normal.           Assessment & Plan:   Problem List Items Addressed This Visit      Nervous and Auditory   ETD (Eustachian tube dysfunction), bilateral    With pressure and occ pain in ears with some mild chronic congestion  Reassuring exam Px flonase to use bid for 7d then daily  Watch for worse ear pain or new symptoms  Update if not starting to improve in a week or if worsening

## 2020-05-29 ENCOUNTER — Other Ambulatory Visit: Payer: Self-pay | Admitting: Family Medicine

## 2020-05-29 DIAGNOSIS — Z1231 Encounter for screening mammogram for malignant neoplasm of breast: Secondary | ICD-10-CM

## 2020-06-13 ENCOUNTER — Other Ambulatory Visit (INDEPENDENT_AMBULATORY_CARE_PROVIDER_SITE_OTHER): Payer: BC Managed Care – PPO

## 2020-06-13 ENCOUNTER — Other Ambulatory Visit: Payer: Self-pay

## 2020-06-13 ENCOUNTER — Encounter: Payer: Self-pay | Admitting: Primary Care

## 2020-06-13 ENCOUNTER — Telehealth (INDEPENDENT_AMBULATORY_CARE_PROVIDER_SITE_OTHER): Payer: BC Managed Care – PPO | Admitting: Primary Care

## 2020-06-13 VITALS — Temp 98.3°F | Ht 64.0 in | Wt 174.0 lb

## 2020-06-13 DIAGNOSIS — R059 Cough, unspecified: Secondary | ICD-10-CM | POA: Diagnosis not present

## 2020-06-13 DIAGNOSIS — R051 Acute cough: Secondary | ICD-10-CM | POA: Insufficient documentation

## 2020-06-13 DIAGNOSIS — E785 Hyperlipidemia, unspecified: Secondary | ICD-10-CM

## 2020-06-13 MED ORDER — HYDROCOD POLST-CPM POLST ER 10-8 MG/5ML PO SUER: 5.0000 mL | Freq: Two times a day (BID) | ORAL | 0 refills | Status: DC | PRN

## 2020-06-13 NOTE — Assessment & Plan Note (Signed)
Acute URI symptoms x4 days, appears well today on video visit. Will check COVID-19, ordered and pending. Discussed precautions over the next several days. Agree to prescribe Tussionex to use as needed for cough, drowsiness precautions provided  Await results.  She appears stable today She will update early next week if symptoms progress.

## 2020-06-13 NOTE — Progress Notes (Signed)
Patient ID: Hannah Rocha, female    DOB: 1966-02-22, 55 y.o.   MRN: [id number]  Virtual visit completed through [employer], a video enabled telemedicine application. Due to national recommendations of social distancing due to COVID-19, a virtual visit is felt to be most appropriate for this patient at this time. Reviewed limitations, risks, security and privacy concerns of performing a virtual visit and the availability of in person appointments. I also reviewed that there may be a patient responsible charge related to this service. The patient agreed to proceed.   Patient location: home Provider location: [facility], office Persons participating in this virtual visit: patient, provider   If any vitals were documented, they were collected by patient at home unless specified below.    Temp 98.3 F (36.8 C) (Oral)   Ht 5\' 4"  (1.626 m)   Wt 174 lb (78.9 kg)   LMP 03/24/2013   BMI 29.87 kg/m    CC: Nasal congestion Subjective:   HPI: Hannah Rocha is a 55 y.o. female presenting on 06/13/2020 for Nasal Congestion (X 4 days ) and Cough (X 2 days not productive. Denies any fever or body aches. )   Ms. Kilner is a 55 year old female with a history of type 2 diabetes, eustachian tube dysfunction, who presents today with a chief complaint of nasal congestion.  She also endorses cough, fatigue. Symptoms began four days ago with nasal congestion, and last night she began with a non productive cough, didn't sleep well.   She denies fevers, body aches, chills, diarrhea, headaches, sick contacts. She has had two Covid-19 vaccines, no booster. She received an influenza vaccine this season.    She is requesting cough medication with codeine for her cough.   She is also due to recheck her cholesterol.       Relevant past medical, surgical, family and social history reviewed and updated as indicated. Interim medical history since our last visit reviewed. Allergies  and medications reviewed and updated. Outpatient Medications Prior to Visit  Medication Sig Dispense Refill  . acetaminophen (TYLENOL) 500 MG tablet Take 500 mg by mouth every 6 (six) hours as needed for mild pain.     . B Complex Vitamins (VITAMIN B COMPLEX PO) Take 1 tablet by mouth daily.    . busPIRone (BUSPAR) 30 MG tablet TAKE 1 TABLET BY MOUTH EVERY MORNING 1/2 TAB AT LUNCH, AND 1/2 TAB AT BEDTIME 180 tablet 3  . escitalopram (LEXAPRO) 10 MG tablet Take 1 tablet (10 mg total) by mouth daily. For anxiety. 90 tablet 3  . flintstones complete (FLINTSTONES) 60 MG chewable tablet Chew 1 tablet by mouth daily.    . fluticasone (FLONASE) 50 MCG/ACT nasal spray Place 2 sprays into both nostrils daily. 16 g 2  . glucose 4 GM chewable tablet Chew 1 tablet (4 g total) by mouth as needed for low blood sugar. 50 tablet 12  . glucose blood (ONETOUCH VERIO) test strip Use as instructed to test blood sugar twice daily e11.9 100 each 12  . ibuprofen (ADVIL) 800 MG tablet Take 1 tablet (800 mg total) by mouth every 8 (eight) hours as needed. 60 tablet 6  . metFORMIN (GLUCOPHAGE) 500 MG tablet TAKE 1 TABLET BY MOUTH EVERY DAY WITH BREAKFAST 90 tablet 3  . ONETOUCH DELICA LANCETS 40J MISC Use to test blood sugar once daily E11.9 100 each 1  . pantoprazole (PROTONIX) 40 MG tablet Take 1 tablet (40 mg total) by mouth  daily. 30 tablet 11  . rosuvastatin (CRESTOR) 5 MG tablet Take 1 tablet (5 mg total) by mouth daily. For cholesterol. 90 tablet 3   No facility-administered medications prior to visit.     Per HPI unless specifically indicated in ROS section below Review of Systems  Constitutional: Positive for fatigue. Negative for chills and fever.  HENT: Positive for congestion. Negative for postnasal drip, sinus pressure and sore throat.   Respiratory: Positive for cough.   Allergic/Immunologic: Positive for environmental allergies.   Objective:  Temp 98.3 F (36.8 C) (Oral)   Ht 5\' 4"  (1.626 m)   Wt  174 lb (78.9 kg)   LMP 03/24/2013   BMI 29.87 kg/m   Wt Readings from Last 3 Encounters:  06/13/20 174 lb (78.9 kg)  05/03/20 174 lb 5 oz (79.1 kg)  04/16/20 172 lb (78 kg)       Physical exam: Gen: alert, NAD, not ill appearing Pulm: speaks in complete sentences without increased work of breathing, no cough. Psych: normal mood, normal thought content      Results for orders placed or performed in visit on 04/16/20  Microalbumin/Creatinine Ratio, Urine  Result Value Ref Range   Microalb, Ur <0.7 0.0 - 1.9 mg/dL   Creatinine,U 58.4 mg/dL   Microalb Creat Ratio 1.2 0.0 - 30.0 mg/g  CBC  Result Value Ref Range   WBC 5.8 4.0 - 10.5 K/uL   RBC 4.19 3.87 - 5.11 Mil/uL   Platelets 198.0 150.0 - 400.0 K/uL   Hemoglobin 12.8 12.0 - 15.0 g/dL   HCT 38.2 36.0 - 46.0 %   MCV 91.4 78.0 - 100.0 fl   MCHC 33.6 30.0 - 36.0 g/dL   RDW 13.1 11.5 - 15.5 %  Comprehensive metabolic panel  Result Value Ref Range   Sodium 136 135 - 145 mEq/L   Potassium 4.7 3.5 - 5.1 mEq/L   Chloride 101 96 - 112 mEq/L   CO2 29 19 - 32 mEq/L   Glucose, Bld 116 (H) 70 - 99 mg/dL   BUN 16 6 - 23 mg/dL   Creatinine, Ser 0.68 0.40 - 1.20 mg/dL   Total Bilirubin 0.4 0.2 - 1.2 mg/dL   Alkaline Phosphatase 89 39 - 117 U/L   AST 13 0 - 37 U/L   ALT 16 0 - 35 U/L   Total Protein 7.1 6.0 - 8.3 g/dL   Albumin 4.6 3.5 - 5.2 g/dL   GFR 97.64 >60.00 mL/min   Calcium 9.3 8.4 - 10.5 mg/dL  Lipid panel  Result Value Ref Range   Cholesterol 223 (H) 0 - 200 mg/dL   Triglycerides 93.0 0.0 - 149.0 mg/dL   HDL 45.90 >39.00 mg/dL   VLDL 18.6 0.0 - 40.0 mg/dL   LDL Cholesterol 158 (H) 0 - 99 mg/dL   Total CHOL/HDL Ratio 5    NonHDL 176.98   TSH  Result Value Ref Range   TSH 3.68 0.35 - 4.50 uIU/mL  VITAMIN D 25 Hydroxy (Vit-D Deficiency, Fractures)  Result Value Ref Range   VITD 34.34 30.00 - 100.00 ng/mL  POCT glycosylated hemoglobin (Hb A1C)  Result Value Ref Range   Hemoglobin A1C 6.6 (A) 4.0 - 5.6 %   HbA1c  POC (<> result, manual entry)     HbA1c, POC (prediabetic range)     HbA1c, POC (controlled diabetic range)     Assessment & Plan:   Problem List Items Addressed This Visit      Other   HLD (  hyperlipidemia)    Compliant to rosuvastatin for the last month, repeat lipid panel pending.      Relevant Orders   Lipid panel   Cough - Primary    Acute URI symptoms x4 days, appears well today on video visit. Will check COVID-19, ordered and pending. Discussed precautions over the next several days. Agree to prescribe Tussionex to use as needed for cough, drowsiness precautions provided  Await results.  She appears stable today She will update early next week if symptoms progress.      Relevant Medications   chlorpheniramine-HYDROcodone (TUSSIONEX PENNKINETIC ER) 10-8 MG/5ML SUER   Other Relevant Orders   Novel Coronavirus, NAA ([facility])      Acute URI symptoms x4 days, appears well today on video visit. Will check COVID-19, ordered and pending. Discussed precautions over the next several days. Agree to prescribe Tussionex to use as needed for cough, drowsiness precautions provided  Await results.  She appears stable today She will update early next week if symptoms progress.   Meds ordered this encounter  Medications  . chlorpheniramine-HYDROcodone (TUSSIONEX PENNKINETIC ER) 10-8 MG/5ML SUER    Sig: Take 5 mLs by mouth every 12 (twelve) hours as needed for cough.    Dispense:  50 mL    Refill:  0    Order Specific Question:   Supervising Provider    Answer:   BEDSOLE, AMY E [[id number]]   Orders Placed This Encounter  Procedures  . Novel Coronavirus, NAA ([facility])    Standing Status:   Future    Standing Expiration Date:   06/13/2021    Order Specific Question:   Is this test for diagnosis or screening    Answer:   Diagnosis of ill patient    Order Specific Question:   Symptomatic for COVID-19 as defined by CDC    Answer:   Yes    Order Specific Question:   Date of Symptom  Onset    Answer:   06/09/2020    Order Specific Question:   Hospitalized for COVID-19    Answer:   No    Order Specific Question:   Admitted to ICU for COVID-19    Answer:   No    Order Specific Question:   Previously tested for COVID-19    Answer:   No    Order Specific Question:   Resident in a congregate (group) care setting    Answer:   No    Order Specific Question:   Is the patient student?    Answer:   No    Order Specific Question:   Employed in healthcare setting    Answer:   No    Order Specific Question:   Pregnant    Answer:   No    Order Specific Question:   Has patient completed COVID vaccination(s) (2 doses of Pfizer/Moderna 1 dose of [employer])    Answer:   Yes    Order Specific Question:   Has patient completed COVID Booster / 3rd dose    Answer:   No  . Lipid panel    Standing Status:   Future    Standing Expiration Date:   06/13/2021    I discussed the assessment and treatment plan with the patient. The patient was provided an opportunity to ask questions and all were answered. The patient agreed with the plan and demonstrated an understanding of the instructions. The patient was advised to call back or seek an in-person evaluation if the symptoms  worsen or if the condition fails to improve as anticipated.  Follow up plan: No follow-ups on file.  Pleas Koch, NP

## 2020-06-13 NOTE — Patient Instructions (Signed)
Please come by this afternoon as scheduled for your Covid test and lab draw.  You may take the cough suppressant every 12 hours as needed for cough and rest. Caution this medication contains codeine which may cause drowsiness.   Please update me early next week if your symptoms have not improved, or they have progressed.  It was a pleasure to see you today!

## 2020-06-13 NOTE — Assessment & Plan Note (Signed)
Compliant to rosuvastatin for the last month, repeat lipid panel pending.

## 2020-06-14 LAB — SARS-COV-2, NAA 2 DAY TAT

## 2020-06-14 LAB — LIPID PANEL
Cholesterol: 159 mg/dL (ref 0–200)
HDL: 40.4 mg/dL (ref >39.00)
LDL Cholesterol: 90 mg/dL (ref 0–99)
NonHDL: 118.38
Total CHOL/HDL Ratio: 4
Triglycerides: 143 mg/dL (ref 0.0–149.0)
VLDL: 28.6 mg/dL (ref 0.0–40.0)

## 2020-06-14 LAB — NOVEL CORONAVIRUS, NAA: SARS-CoV-2, NAA: NOT DETECTED

## 2020-07-27 LAB — HM DIABETES EYE EXAM

## 2020-08-27 ENCOUNTER — Other Ambulatory Visit: Payer: Self-pay

## 2020-08-27 ENCOUNTER — Encounter: Payer: Self-pay | Admitting: Family Medicine

## 2020-08-27 ENCOUNTER — Ambulatory Visit: Payer: BC Managed Care – PPO | Admitting: Family Medicine

## 2020-08-27 VITALS — BP 122/78 | HR 77 | Temp 97.0°F | Ht 64.0 in | Wt 180.0 lb

## 2020-08-27 DIAGNOSIS — L237 Allergic contact dermatitis due to plants, except food: Secondary | ICD-10-CM | POA: Diagnosis not present

## 2020-08-27 MED ORDER — METHYLPREDNISOLONE ACETATE 40 MG/ML IJ SUSP
40.0000 mg | Freq: Once | INTRAMUSCULAR | Status: AC
  Administered 2020-08-27: 40 mg via INTRAMUSCULAR

## 2020-08-27 MED ORDER — PREDNISONE 10 MG PO TABS
ORAL_TABLET | ORAL | 0 refills | Status: DC
Start: 2020-08-27 — End: 2020-10-15

## 2020-08-27 NOTE — Assessment & Plan Note (Signed)
Primarily on upper legs after working outdoors in [employer], forensic imp of keeping clean with soap and water Prn antihistamine otc Anti itch spray prn Depo medrol 40 mg IM given today  Prednisone taper 30 mg given  Disc side eff including hyperglycemia, jitteriness and hunger  Will watch diet and blood sugar  Update if not starting to improve in a week or if worsening

## 2020-08-27 NOTE — Progress Notes (Signed)
Subjective:    Patient ID: Hannah Rocha, female    DOB: 1965/06/14, 55 y.o.   MRN: [id number]  This visit occurred during the SARS-CoV-2 public health emergency.  Safety protocols were in place, including screening questions prior to the visit, additional usage of staff PPE, and extensive cleaning of exam room while observing appropriate contact time as indicated for disinfecting solutions.    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 presents with rash on legs/poison oak  Wt Readings from Last 3 Encounters:  08/27/20 180 lb (81.6 kg)  06/13/20 174 lb (78.9 kg)  05/03/20 174 lb 5 oz (79.1 kg)   30.90 kg/m  Unsure where she got into the poison oak She cleaned up a ditch for her neighborhood (husband does not get it)  That was Monday a wk ago and then rash by Thursday   Was wearing shorts  Rash is on legs  One little spot on arm   She has used itch relief spray from [facility] No antihistamines   Itching is bad   Wants to get a solumedrol shot   Pt has diabetes Lab Results  Component Value Date   HGBA1C 6.6 (A) 04/16/2020    Patient Active Problem List   Diagnosis Date Noted  . Cough 06/13/2020  . ETD (Eustachian tube dysfunction), bilateral 05/03/2020  . Right wrist pain 03/29/2019  . De Quervain's tenosynovitis, right 03/29/2019  . Plant allergic contact dermatitis 10/13/2018  . Well woman exam without gynecological exam 10/04/2018  . HLD (hyperlipidemia) 03/09/2016  . Diabetes ([place]) 03/09/2016  . Vitamin D deficiency 03/09/2016  . Varicose veins 12/28/2013  . Elevated blood pressure 04/04/2012  . GAD (generalized anxiety disorder) 04/04/2012  . GERD (gastroesophageal reflux disease) 01/23/2011  . ANEMIA-IRON DEFICIENCY 06/21/2009  . IRON DEFICIENCY ANEMIA, HX OF 07/10/2008  . ATRIAL SEPTAL DEFECT, SECUNDUM TYPE 05/03/2007   Past Medical History:  Diagnosis Date  . Allergy   . Anemia    hx  . Anxiety   . CARDIAC MURMUR, HX OF 05/03/2007   Annotation: ASD Qualifier:  Diagnosis of  By: Council Mechanic MD, Hilaria Ota   . Cataract    FLOATING CATARACT   . Diabetes mellitus without complication ([place])   . GERD (gastroesophageal reflux disease)   . H/O hiatal hernia   . Heart murmur    ASD repair 1999- no meds   . HIATAL HERNIA 07/09/2008   Qualifier: Diagnosis of  By: Nils Pyle CMA ([place]), Mearl Latin    . Hx of dysplastic nevus    multiple sites, some severe  . Hyperlipidemia   . IBS 06/21/2009   Qualifier: Diagnosis of  By: Sharlett Iles MD FACG, [place] SYNDROME 01/15/2010   Qualifier: Diagnosis of  By: Deborra Medina MD, Tanja Port    . Obesity   . PONV (postoperative nausea and vomiting)   . 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 ASD REPAIR  1999   [facility]  . BREAST CYST ASPIRATION Right 2013  . CLEFT LIP REPAIR    . COLONOSCOPY    . LAPAROSCOPIC ASSISTED VAGINAL HYSTERECTOMY Bilateral 04/13/2013   Procedure: LAPAROSCOPIC ASSISTED VAGINAL HYSTERECTOMY;  Surgeon: Emily Filbert, MD;  Location: [facility];  Service: Gynecology;  Laterality: Bilateral;- pt states was abdominal hysterectomy   . LAPAROSCOPIC TUBAL LIGATION  03/21/2012   Procedure: LAPAROSCOPIC  TUBAL LIGATION;  Surgeon: Emily Filbert, MD;  Location: [facility];  Service: Gynecology;  Laterality: Bilateral;  . MANDIBLE FRACTURE SURGERY    . Plantar Fascitis Repair Bilateral 01/2015   Social History   Tobacco Use  . Smoking status: Former Smoker    Packs/day: 0.00    Years: 0.00    Pack years: 0.00    Types: Cigarettes    Quit date: 02/21/1987    Years since quitting: 33.5  . Smokeless tobacco: Never Used  . Tobacco comment: 22 years ago-light smoker  Vaping Use  . Vaping Use: Never used  Substance Use Topics  . Alcohol use: Yes    Alcohol/week: 0.0 standard drinks    Comment: occasional - hard apple cider, shot  . Drug use: No   Family History   Problem Relation Age of Onset  . Heart disease Mother   . Colon polyps Mother   . Diabetes Mother   . Prostate cancer Father   . Heart disease Father   . Cancer Father        prostate  . Diabetes Maternal Aunt   . Diabetes Paternal Grandmother   . Fibromyalgia Sister   . Colon cancer Neg Hx   . Breast cancer Neg Hx   . Esophageal cancer Neg Hx   . Rectal cancer Neg Hx   . Stomach cancer Neg Hx    Allergies  Allergen Reactions  . Sertraline Hcl     REACTION: vomiting, severe anxiety   Current Outpatient Medications on File Prior to Visit  Medication Sig Dispense Refill  . acetaminophen (TYLENOL) 500 MG tablet Take 500 mg by mouth every 6 (six) hours as needed for mild pain.     . B Complex Vitamins (VITAMIN B COMPLEX PO) Take 1 tablet by mouth daily.    . busPIRone (BUSPAR) 30 MG tablet TAKE 1 TABLET BY MOUTH EVERY MORNING 1/2 TAB AT LUNCH, AND 1/2 TAB AT BEDTIME 180 tablet 3  . escitalopram (LEXAPRO) 10 MG tablet Take 1 tablet (10 mg total) by mouth daily. For anxiety. 90 tablet 3  . flintstones complete (FLINTSTONES) 60 MG chewable tablet Chew 1 tablet by mouth daily.    . fluticasone (FLONASE) 50 MCG/ACT nasal spray Place 2 sprays into both nostrils daily. 16 g 2  . glucose 4 GM chewable tablet Chew 1 tablet (4 g total) by mouth as needed for low blood sugar. 50 tablet 12  . glucose blood (ONETOUCH VERIO) test strip Use as instructed to test blood sugar twice daily e11.9 100 each 12  . ibuprofen (ADVIL) 800 MG tablet Take 1 tablet (800 mg total) by mouth every 8 (eight) hours as needed. 60 tablet 6  . metFORMIN (GLUCOPHAGE) 500 MG tablet TAKE 1 TABLET BY MOUTH EVERY DAY WITH BREAKFAST 90 tablet 3  . ONETOUCH DELICA LANCETS 49Q MISC Use to test blood sugar once daily E11.9 100 each 1  . pantoprazole (PROTONIX) 40 MG tablet Take 1 tablet (40 mg total) by mouth daily. 30 tablet 11  . rosuvastatin (CRESTOR) 5 MG tablet Take 1 tablet (5 mg total) by mouth daily. For cholesterol. 90  tablet 3   No current facility-administered medications on file prior to visit.     Review of Systems  Constitutional: Negative for activity change, appetite change, fatigue, fever and unexpected weight change.  HENT: Negative for congestion, ear pain, rhinorrhea, sinus pressure and sore throat.   Eyes: Negative for pain, redness and visual disturbance.  Respiratory: Negative for  cough, shortness of breath and wheezing.   Cardiovascular: Negative for chest pain and palpitations.  Gastrointestinal: Negative for abdominal pain, blood in stool, constipation and diarrhea.  Endocrine: Negative for polydipsia and polyuria.  Genitourinary: Negative for dysuria, frequency and urgency.  Musculoskeletal: Negative for arthralgias, back pain and myalgias.  Skin: Positive for rash. Negative for pallor.       Itching   Allergic/Immunologic: Negative for environmental allergies.  Neurological: Negative for dizziness, syncope and headaches.  Hematological: Negative for adenopathy. Does not bruise/bleed easily.  Psychiatric/Behavioral: Negative for decreased concentration and dysphoric mood. The patient is not nervous/anxious.        Objective:   Physical Exam Constitutional:      General: She is not in acute distress.    Appearance: Normal appearance. She is obese. She is not ill-appearing.  Eyes:     General: No scleral icterus.       Right eye: No discharge.        Left eye: No discharge.     Conjunctiva/sclera: Conjunctivae normal.     Pupils: Pupils are equal, round, and reactive to light.  Cardiovascular:     Rate and Rhythm: Normal rate and regular rhythm.  Musculoskeletal:     Cervical back: Neck supple.     Right lower leg: No edema.     Left lower leg: No edema.  Lymphadenopathy:     Cervical: No cervical adenopathy.  Skin:    Findings: Rash present. No bruising.     Comments: Erythematous vesicular rash taking linear and patchy pattern mostly on upper legs No pustules  No  excoriations or drainage   Neurological:     Mental Status: She is alert.  Psychiatric:        Mood and Affect: Mood normal.           Assessment & Plan:   Problem List Items Addressed This Visit      Musculoskeletal and Integument   Plant allergic contact dermatitis - Primary    Primarily on upper legs after working outdoors in shorts  Disc imp of keeping clean with soap and water Prn antihistamine otc Anti itch spray prn Depo medrol 40 mg IM given today  Prednisone taper 30 mg given  Disc side eff including hyperglycemia, jitteriness and hunger  Will watch diet and blood sugar  Update if not starting to improve in a week or if worsening

## 2020-08-27 NOTE — Patient Instructions (Addendum)
Steroid injection today   Follow that with prednisone 30 mg taper  This will make your blood sugar go up and may make you hyper and hungry   Keep rash clean and dry  Keep cool  The anti itch spray helps also   Update if not starting to improve in a week or if worsening

## 2020-09-12 ENCOUNTER — Ambulatory Visit
Admission: RE | Admit: 2020-09-12 | Discharge: 2020-09-12 | Disposition: A | Discharge status: Home / Self Care | Payer: BC Managed Care – PPO | Source: Ambulatory Visit | Attending: Family Medicine | Admitting: Family Medicine

## 2020-09-12 ENCOUNTER — Other Ambulatory Visit: Payer: Self-pay

## 2020-09-12 DIAGNOSIS — Z1231 Encounter for screening mammogram for malignant neoplasm of breast: Secondary | ICD-10-CM | POA: Insufficient documentation

## 2020-10-03 ENCOUNTER — Other Ambulatory Visit: Payer: Self-pay

## 2020-10-03 MED ORDER — BUSPIRONE HCL 30 MG PO TABS: ORAL_TABLET | ORAL | 0 refills | Status: DC

## 2020-10-03 NOTE — Telephone Encounter (Signed)
Buspirone   LAST APPOINTMENT DATE: 04/16/2020 has had other acute visits    NEXT APPOINTMENT DATE: 10/15/2020    LAST REFILL: 10/10/2019  QTY: #180 3 rf

## 2020-10-15 ENCOUNTER — Encounter: Payer: Self-pay | Admitting: Primary Care

## 2020-10-15 ENCOUNTER — Other Ambulatory Visit: Payer: Self-pay

## 2020-10-15 ENCOUNTER — Ambulatory Visit: Payer: BC Managed Care – PPO | Admitting: Primary Care

## 2020-10-15 VITALS — BP 134/78 | HR 76 | Temp 96.0°F | Ht 64.0 in | Wt 177.0 lb

## 2020-10-15 DIAGNOSIS — E119 Type 2 diabetes mellitus without complications: Secondary | ICD-10-CM | POA: Diagnosis not present

## 2020-10-15 LAB — POCT GLYCOSYLATED HEMOGLOBIN (HGB A1C): Hemoglobin A1C: 8.5 % — AB (ref 4.0–5.6)

## 2020-10-15 MED ORDER — METFORMIN HCL ER 500 MG PO TB24: 500.0000 mg | ORAL_TABLET | Freq: Every day | ORAL | 1 refills | Status: DC

## 2020-10-15 NOTE — Progress Notes (Signed)
Subjective:    Patient ID: Hannah Rocha, female    DOB: [birth year], 55 y.o.   MRN: [id number]  HPI  Hannah Rocha is a very pleasant 55 y.o. female with a history of type 2 diabetes, hyperlipidemia, GAD who presents today for diabetes follow up.  Current medications include: Metformin 500 mg once daily. She does have diarrhea with metformin.   She is checking her blood glucose 2 times daily and is getting readings ranging mid to high 100's and some in the low 200's. She endorses a poor diet and is not exercising.   Last A1C: 6.6 in January 2022, 8.5 today  Last Eye Exam: UTD Last Foot Exam: Due in 2023 Pneumonia Vaccination: 2019 Urine Microalbumin: UTD Statin: Crestor  Dietary changes since last visit: Poor diet, has been eating french fries and ice cream. She has seen a nutritionist in the past and is aware of what she should be eating.    Exercise: No regular exercise.    BP Readings from Last 3 Encounters:  10/15/20 134/78  08/27/20 122/78  05/03/20 124/80        Review of Systems  Eyes:  Negative for visual disturbance.  Cardiovascular:  Negative for chest pain.  Neurological:  Negative for dizziness.        Past Medical History:  Diagnosis Date   Allergy    Anemia    hx   Anxiety    CARDIAC MURMUR, HX OF 05/03/2007   Annotation: ASD Qualifier: Diagnosis of  By: Council Mechanic MD, Hilaria Ota    Cataract    FLOATING CATARACT    Diabetes mellitus without complication ([place])    GERD (gastroesophageal reflux disease)    H/O hiatal hernia    Heart murmur    ASD repair 1999- no meds    HIATAL HERNIA 07/09/2008   Qualifier: Diagnosis of  By: Nils Pyle CMA (AAMA), Leisha     Hx of dysplastic nevus    multiple sites, some severe   Hyperlipidemia    IBS 06/21/2009   Qualifier: Diagnosis of  By: Sharlett Iles MD Byrd Hesselbach    MENOPAUSAL SYNDROME 01/15/2010   Qualifier: Diagnosis of  By: Deborra Medina MD, Talia     Obesity    PONV (postoperative nausea and  vomiting)    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   Annotation: REPAIRED AS CHILD Qualifier: Diagnosis of  By: Council Mechanic MD, Hilaria Ota     Social History   Socioeconomic History   Marital status: Married    Spouse name: Not on file   Number of children: 0   Years of education: Not on file   Highest education level: Not on file  Occupational History   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    Employer: [employer]  Tobacco Use   Smoking status: Former    Packs/day: 0.00    Years: 0.00    Pack years: 0.00    Types: Cigarettes    Quit date: 02/21/1987    Years since quitting: 33.6   Smokeless tobacco: Never   Tobacco comments:    22 years ago-light smoker  Vaping Use   Vaping Use: Never used  Substance and Sexual Activity   Alcohol use: Yes    Alcohol/week: 0.0 standard drinks    Comment: occasional - hard apple cider, shot   Drug use: No   Sexual activity: Yes    Partners: Male    Birth control/protection: Surgical    Comment: tubaligation/hysterctomy  Other Topics  Concern   Not on file  Social History Narrative   Daily caffeine use   Social Determinants of Health   Financial Resource Strain: Not on file  Food Insecurity: Not on file  Transportation Needs: Not on file  Physical Activity: Not on file  Stress: Not on file  Social Connections: Not on file  Intimate Partner Violence: Not on file    Past Surgical History:  Procedure Laterality Date   ASD REPAIR  1999   [facility]   BREAST CYST ASPIRATION Right 2013   CLEFT LIP [place] Bilateral 04/13/2013   Procedure: LAPAROSCOPIC ASSISTED VAGINAL HYSTERECTOMY;  Surgeon: Emily Filbert, MD;  Location: [facility];  Service: Gynecology;  Laterality: Bilateral;- pt states was abdominal hysterectomy    LAPAROSCOPIC TUBAL LIGATION  03/21/2012   Procedure: LAPAROSCOPIC TUBAL LIGATION;  Surgeon: Emily Filbert, MD;  Location: [facility];  Service:  Gynecology;  Laterality: Bilateral;   MANDIBLE FRACTURE SURGERY     Plantar Fascitis Repair Bilateral 01/2015    Family History  Problem Relation Age of Onset   Heart disease Mother    Colon polyps Mother    Diabetes Mother    Prostate cancer Father    Heart disease Father    Cancer Father        prostate   Diabetes Maternal Aunt    Diabetes Paternal Grandmother    Fibromyalgia Sister    Colon cancer Neg Hx    Breast cancer Neg Hx    Esophageal cancer Neg Hx    Rectal cancer Neg Hx    Stomach cancer Neg Hx     Allergies  Allergen Reactions   Sertraline Hcl     REACTION: vomiting, severe anxiety    Current Outpatient Medications on File Prior to Visit  Medication Sig Dispense Refill   acetaminophen (TYLENOL) 500 MG tablet Take 500 mg by mouth every 6 (six) hours as needed for mild pain.      B Complex Vitamins (VITAMIN B COMPLEX PO) Take 1 tablet by mouth daily.     busPIRone (BUSPAR) 30 MG tablet TAKE 1 TABLET BY MOUTH EVERY MORNING 1/2 TAB AT LUNCH, AND 1/2 TAB AT BEDTIME 180 tablet 0   escitalopram (LEXAPRO) 10 MG tablet Take 1 tablet (10 mg total) by mouth daily. For anxiety. 90 tablet 3   flintstones complete (FLINTSTONES) 60 MG chewable tablet Chew 1 tablet by mouth daily.     fluticasone (FLONASE) 50 MCG/ACT nasal spray Place 2 sprays into both nostrils daily. 16 g 2   glucose 4 GM chewable tablet Chew 1 tablet (4 g total) by mouth as needed for low blood sugar. 50 tablet 12   glucose blood (ONETOUCH VERIO) test strip Use as instructed to test blood sugar twice daily e11.9 100 each 12   ibuprofen (ADVIL) 800 MG tablet Take 1 tablet (800 mg total) by mouth every 8 (eight) hours as needed. 60 tablet 6   metFORMIN (GLUCOPHAGE) 500 MG tablet TAKE 1 TABLET BY MOUTH EVERY DAY WITH BREAKFAST 90 tablet 3   ONETOUCH DELICA LANCETS 99991111 MISC Use to test blood sugar once daily E11.9 100 each 1   pantoprazole (PROTONIX) 40 MG tablet Take 1 tablet (40 mg total) by mouth daily. 30  tablet 11   rosuvastatin (CRESTOR) 5 MG tablet Take 1 tablet (5 mg total) by mouth daily. For cholesterol. 90 tablet 3   No current facility-administered medications on  file prior to visit.    BP 134/78   Pulse 76   Temp (!) 96 F (35.6 C) (Temporal)   Ht '5\' 4"'$  (1.626 m)   Wt 177 lb (80.3 kg)   LMP 03/24/2013   SpO2 99%   BMI 30.38 kg/m  Objective:   Physical Exam Cardiovascular:     Rate and Rhythm: Normal rate and regular rhythm.  Pulmonary:     Effort: Pulmonary effort is normal.     Breath sounds: Normal breath sounds.  Musculoskeletal:     Cervical back: Neck supple.  Skin:    General: Skin is warm and dry.  Psychiatric:        Mood and Affect: Mood normal.          Assessment & Plan:      This visit occurred during the SARS-CoV-2 public health emergency.  Safety protocols were in place, including screening questions prior to the visit, additional usage of staff PPE, and extensive cleaning of exam room while observing appropriate contact time as indicated for disinfecting solutions.

## 2020-10-15 NOTE — Patient Instructions (Addendum)
Stop taking regular metformin 500 mg.  Start taking metformin ER 500 mg once daily with breakfast for diabetes.  Schedule a lab visit for 3 months to repeat your A1C.  We will see you in February 2023.  It was a pleasure to see you today!

## 2020-10-15 NOTE — Assessment & Plan Note (Addendum)
Uncontrolled with A1C today of 8.5, increased from 6.6 six months ago.  She is not getting the full benefit from her metformin as she's taking once daily. Discussed this. She does have diarrhea so we will discontinue regular metformin and switch to Metformin XR 500 mg to reduce risk of diarrhea and to provide 24 hour coverage.  She is very motivated to work on her diet and start exercising, she's been very successful with this before.   Repeat A1C in 3 months, follow up in 6 months.

## 2020-12-21 ENCOUNTER — Other Ambulatory Visit: Payer: Self-pay | Admitting: Gastroenterology

## 2021-01-02 ENCOUNTER — Other Ambulatory Visit: Payer: Self-pay | Admitting: Family Medicine

## 2021-01-02 DIAGNOSIS — F411 Generalized anxiety disorder: Secondary | ICD-10-CM

## 2021-01-07 ENCOUNTER — Other Ambulatory Visit: Payer: Self-pay | Admitting: Primary Care

## 2021-01-07 ENCOUNTER — Encounter: Payer: Self-pay | Admitting: Gastroenterology

## 2021-01-07 ENCOUNTER — Ambulatory Visit: Payer: BC Managed Care – PPO | Admitting: Gastroenterology

## 2021-01-07 VITALS — BP 148/70 | HR 76 | Ht 64.0 in | Wt 174.0 lb

## 2021-01-07 DIAGNOSIS — Z86010 Personal history of colonic polyps: Secondary | ICD-10-CM

## 2021-01-07 DIAGNOSIS — K219 Gastro-esophageal reflux disease without esophagitis: Secondary | ICD-10-CM | POA: Diagnosis not present

## 2021-01-07 DIAGNOSIS — E119 Type 2 diabetes mellitus without complications: Secondary | ICD-10-CM

## 2021-01-07 DIAGNOSIS — Z8371 Family history of colonic polyps: Secondary | ICD-10-CM

## 2021-01-07 MED ORDER — PANTOPRAZOLE SODIUM 40 MG PO TBEC: 40.0000 mg | DELAYED_RELEASE_TABLET | Freq: Every day | ORAL | 3 refills | Status: DC

## 2021-01-07 NOTE — Patient Instructions (Addendum)
We have sent the following medications to your pharmacy for you to pick up at your convenience: Pantoprazole 40mg  by mouth once daily  Please follow up with Dr Fuller Plan in 1 year.  If you are age 56 or older, your body mass index should be between 23-30. Your Body mass index is 29.87 kg/m. If this is out of the aforementioned range listed, please consider follow up with your Primary Care Provider.  If you are age 59 or younger, your body mass index should be between 19-25. Your Body mass index is 29.87 kg/m. If this is out of the aformentioned range listed, please consider follow up with your Primary Care Provider.   ________________________________________________________  The [facility] providers would like to encourage you to use [facility] to communicate with providers for non-urgent requests or questions.  Due to long hold times on the telephone, sending your provider a message by [facility] may be a faster and more efficient way to get a response.  Please allow 48 business hours for a response.  Please remember that this is for non-urgent requests.  _______________________________________________________  Due to recent changes in healthcare laws, you may see the results of your imaging and laboratory studies on MyChart before your provider has had a chance to review them.  We understand that in some cases there may be results that are confusing or concerning to you. Not all laboratory results come back in the same time frame and the provider may be waiting for multiple results in order to interpret others.  Please give [place] 48 hours in order for your provider to thoroughly review all the results before contacting the office for clarification of your results.

## 2021-01-07 NOTE — Progress Notes (Signed)
    History of Present Illness: This is a 55 year old female returning for management of GERD.  Her symptoms are well controlled on pantoprazole.  She relates occasional variation in bowel habits.  She generally has a bowel movement following meals.  Her bowel pattern has not changed.  Current Medications, Allergies, Past Medical History, Past Surgical History, Family History and Social History were reviewed in [employer] record.   Physical Exam: General: Well developed, well nourished, no acute distress Head: Normocephalic and atraumatic Eyes: Sclerae anicteric, EOMI Ears: Normal auditory acuity Mouth: Not examined, mask on during Covid-19 pandemic Lungs: Clear throughout to auscultation Heart: Regular rate and rhythm; no murmurs, rubs or bruits Abdomen: Soft, non tender and non distended. No masses, hepatosplenomegaly or hernias noted. Normal Bowel sounds Rectal: Musculoskeletal: Symmetrical with no gross deformities  Pulses:  Normal pulses noted Extremities: No clubbing, cyanosis, edema or deformities noted Neurological: Alert oriented x 4, grossly nonfocal Psychological:  Alert and cooperative. Normal mood and affect   Assessment and Recommendations:  GERD.  Follow antireflux measures and continue pantoprazole 40 mg p.o. daily. REV in 1 year.  Personal history of adenomatous colon polyps and family history of colon polyps in a first-degree relative.  A 5-year interval colonoscopy is recommended in October 2024.

## 2021-01-15 ENCOUNTER — Other Ambulatory Visit: Payer: Self-pay

## 2021-01-15 ENCOUNTER — Other Ambulatory Visit: Payer: BC Managed Care – PPO

## 2021-01-15 ENCOUNTER — Ambulatory Visit (INDEPENDENT_AMBULATORY_CARE_PROVIDER_SITE_OTHER): Payer: BC Managed Care – PPO

## 2021-01-15 DIAGNOSIS — E119 Type 2 diabetes mellitus without complications: Secondary | ICD-10-CM

## 2021-01-15 LAB — POCT GLYCOSYLATED HEMOGLOBIN (HGB A1C): Hemoglobin A1C: 7.1 % — AB (ref 4.0–5.6)

## 2021-02-07 ENCOUNTER — Telehealth (INDEPENDENT_AMBULATORY_CARE_PROVIDER_SITE_OTHER): Payer: BC Managed Care – PPO | Admitting: Nurse Practitioner

## 2021-02-07 VITALS — Temp 101.0°F

## 2021-02-07 DIAGNOSIS — U071 COVID-19: Secondary | ICD-10-CM | POA: Insufficient documentation

## 2021-02-07 HISTORY — DX: COVID-19: U07.1

## 2021-02-07 MED ORDER — MOLNUPIRAVIR EUA 200MG CAPSULE: 4.0000 | ORAL_CAPSULE | Freq: Two times a day (BID) | ORAL | 0 refills | Status: AC

## 2021-02-07 NOTE — Progress Notes (Signed)
Patient ID: Hannah Rocha, female    DOB: Mar 10, 1966, 55 y.o.   MRN: [id number]  Virtual visit completed through [employer], a video enabled telemedicine application. Due to national recommendations of social distancing due to COVID-19, a virtual visit is felt to be most appropriate for this patient at this time. Reviewed limitations, risks, security and privacy concerns of performing a virtual visit and the availability of in person appointments. I also reviewed that there may be a patient responsible charge related to this service. The patient agreed to proceed.   Patient location: home Provider location: [place] at [facility], office Persons participating in this virtual visit: patient, provider   If any vitals were documented, they were collected by patient at home unless specified below.    Temp (!) 101 F (38.3 C) Comment: per patient  LMP 03/24/2013    CC:  Covid 19 Subjective:   HPI: Hannah Rocha is a 55 y.o. female presenting on 02/07/2021 for Covid Positive (On 02/06/21, sx started on 02/06/21-sx are body aches, cough, nausea, fever 102 last night, stuffy nose.)   Symptoms started on 02/06/2021 At home test yesterday and p[ositive Pfizer x2 No sick contacts  Has been using tylenol and ibuprofen. Helped with fever    Relevant past medical, surgical, family and social history reviewed and updated as indicated. Interim medical history since our last visit reviewed. Allergies and medications reviewed and updated. Outpatient Medications Prior to Visit  Medication Sig Dispense Refill   acetaminophen (TYLENOL) 500 MG tablet Take 500 mg by mouth every 6 (six) hours as needed for mild pain.      B Complex Vitamins (VITAMIN B COMPLEX PO) Take 1 tablet by mouth daily.     busPIRone (BUSPAR) 30 MG tablet TAKE 1 TABLET BY MOUTH EVERY MORNING 1/2 TAB AT LUNCH, AND 1/2 TAB AT BEDTIME for anxiety. 180 tablet 0   escitalopram (LEXAPRO) 10 MG tablet Take 1 tablet  (10 mg total) by mouth daily. For anxiety. 90 tablet 3   flintstones complete (FLINTSTONES) 60 MG chewable tablet Chew 1 tablet by mouth daily.     fluticasone (FLONASE) 50 MCG/ACT nasal spray Place 2 sprays into both nostrils daily. 16 g 2   glucose 4 GM chewable tablet Chew 1 tablet (4 g total) by mouth as needed for low blood sugar. 50 tablet 12   glucose blood (ONETOUCH VERIO) test strip Use as instructed to test blood sugar twice daily e11.9 100 each 12   ibuprofen (ADVIL) 800 MG tablet Take 1 tablet (800 mg total) by mouth every 8 (eight) hours as needed. 60 tablet 6   metFORMIN (GLUCOPHAGE XR) 500 MG 24 hr tablet Take 1 tablet (500 mg total) by mouth daily with breakfast. For diabetes 90 tablet 1   ONETOUCH DELICA LANCETS 15Q MISC Use to test blood sugar once daily E11.9 100 each 1   pantoprazole (PROTONIX) 40 MG tablet Take 1 tablet (40 mg total) by mouth daily. 90 tablet 3   rosuvastatin (CRESTOR) 5 MG tablet Take 1 tablet (5 mg total) by mouth daily. For cholesterol. 90 tablet 3   No facility-administered medications prior to visit.     Per HPI unless specifically indicated in ROS section below Review of Systems  Constitutional:  Positive for chills and fever. Negative for fatigue.  HENT:  Positive for congestion. Negative for ear discharge, ear pain and sore throat.   Respiratory:  Positive for cough. Negative for shortness of breath.   Cardiovascular:  Negative for chest pain.  Gastrointestinal:  Positive for nausea. Negative for abdominal pain, diarrhea and vomiting.  Musculoskeletal:  Positive for myalgias. Negative for arthralgias.  Neurological:  Negative for headaches.  Objective:  Temp (!) 101 F (38.3 C) Comment: per patient  LMP 03/24/2013   Wt Readings from Last 3 Encounters:  01/07/21 174 lb (78.9 kg)  10/15/20 177 lb (80.3 kg)  08/27/20 180 lb (81.6 kg)       Physical exam: Gen: alert, NAD, not ill appearing Pulm: speaks in complete sentences without  increased work of breathing Psych: normal mood, normal thought content      Results for orders placed or performed in visit on 01/15/21  POCT glycosylated hemoglobin (Hb A1C)  Result Value Ref Range   Hemoglobin A1C 7.1 (A) 4.0 - 5.6 %   HbA1c POC (<> result, manual entry)     HbA1c, POC (prediabetic range)     HbA1c, POC (controlled diabetic range)     Assessment & Plan:   Problem List Items Addressed This Visit       Other   COVID-19 - Primary    Patient positive for COVID-19.  Did discuss treatment options.  Did explain that they are both under EUA currently.  Discussion patient decided to continue with treatment since that we will have her more recent renal function on file and will use molnupiravir.  Did discuss common side effects with medication use.  Did also discuss signs and symptoms when to be seen in urgent or emergently.  Also discussed CDC guidelines in regards to quarantine.  Continue to monitor Start molnupiravir soon as possible      Relevant Medications   molnupiravir EUA (LAGEVRIO) 200 mg CAPS capsule     No orders of the defined types were placed in this encounter.  No orders of the defined types were placed in this encounter.   I discussed the assessment and treatment plan with the patient. The patient was provided an opportunity to ask questions and all were answered. The patient agreed with the plan and demonstrated an understanding of the instructions. The patient was advised to call back or seek an in-person evaluation if the symptoms worsen or if the condition fails to improve as anticipated.  Follow up plan: No follow-ups on file.  Romilda Garret, NP

## 2021-02-07 NOTE — Assessment & Plan Note (Signed)
Patient positive for COVID-19.  Did discuss treatment options.  Did explain that they are both under EUA currently.  Discussion patient decided to continue with treatment since that we will have her more recent renal function on file and will use molnupiravir.  Did discuss common side effects with medication use.  Did also discuss signs and symptoms when to be seen in urgent or emergently.  Also discussed CDC guidelines in regards to quarantine.  Continue to monitor Start molnupiravir soon as possible

## 2021-02-20 ENCOUNTER — Ambulatory Visit: Payer: BC Managed Care – PPO | Admitting: Nurse Practitioner

## 2021-02-20 ENCOUNTER — Other Ambulatory Visit: Payer: Self-pay

## 2021-02-20 VITALS — BP 148/90 | HR 73 | Temp 97.3°F | Resp 16 | Ht 64.0 in | Wt 169.5 lb

## 2021-02-20 DIAGNOSIS — R110 Nausea: Secondary | ICD-10-CM | POA: Insufficient documentation

## 2021-02-20 DIAGNOSIS — L309 Dermatitis, unspecified: Secondary | ICD-10-CM | POA: Diagnosis not present

## 2021-02-20 MED ORDER — ONDANSETRON 4 MG PO TBDP: 4.0000 mg | ORAL_TABLET | Freq: Two times a day (BID) | ORAL | 0 refills | Status: AC | PRN

## 2021-02-20 MED ORDER — METHYLPREDNISOLONE ACETATE 40 MG/ML IJ SUSP
40.0000 mg | Freq: Once | INTRAMUSCULAR | Status: AC
  Administered 2021-02-20: 40 mg via INTRAMUSCULAR

## 2021-02-20 NOTE — Assessment & Plan Note (Signed)
We will send in a short course of nausea medication she can use intermittently.  Discussed with patient if this does not resolve she will need further work-up patient acknowledged. Start Zofran 4 mg ODT twice daily as needed nausea

## 2021-02-20 NOTE — Progress Notes (Signed)
Acute Office Visit  Subjective:    Patient ID: Hannah Rocha, female    DOB: 17-Oct-1965, 55 y.o.   MRN: [id number]  Chief Complaint  Patient presents with   Eye Problem    Started around 11/28 or 02/18/21-Redness on top of right eyelid, and some on the left side. Some itching present. Some redness has spread down right cheek. Some sticky stuff on the eyes when she wakes up in the morning. Has used some cold compress.     Patient is in today for Eye issue Started  on 02/17/2021 No injury or FOB  Been puffy, itching and discharge that is white in nature. Has been using hydrocortisone cream to her eye lids that has helped with itching Has also been using cold compress to help with swelling  Had a similar event and she stopped using her makeup and it resolved. Has not used make up for the past 2 days with some improvement.  Patient also mentioned that she has been experiencing intermittent nausea since her covid 19 run in. She was wondering if she could have some nausea medication as needed  Past Medical History:  Diagnosis Date   Allergy    Anemia    hx   Anxiety    CARDIAC MURMUR, HX OF 05/03/2007   Annotation: ASD Qualifier: Diagnosis of  By: Council Mechanic MD, Hilaria Ota    Cataract    FLOATING CATARACT    Diabetes mellitus without complication ([place])    GERD (gastroesophageal reflux disease)    H/O hiatal hernia    Heart murmur    ASD repair 1999- no meds    HIATAL HERNIA 07/09/2008   Qualifier: Diagnosis of  By: Nils Pyle CMA (AAMA), Leisha     Hx of dysplastic nevus    multiple sites, some severe   Hyperlipidemia    IBS 06/21/2009   Qualifier: Diagnosis of  By: Sharlett Iles MD Byrd Hesselbach    MENOPAUSAL SYNDROME 01/15/2010   Qualifier: Diagnosis of  By: Deborra Medina MD, Talia     Obesity    PONV (postoperative nausea and vomiting)    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   Annotation: REPAIRED AS CHILD Qualifier: Diagnosis of  By: Council Mechanic MD, Hilaria Ota     Past Surgical History:  Procedure Laterality Date   ASD REPAIR  1999   [facility]   BREAST CYST ASPIRATION Right 2013   CLEFT LIP REPAIR     COLONOSCOPY     LAPAROSCOPIC ASSISTED VAGINAL HYSTERECTOMY Bilateral 04/13/2013   Procedure: LAPAROSCOPIC ASSISTED VAGINAL HYSTERECTOMY;  Surgeon: Emily Filbert, MD;  Location: [facility];  Service: Gynecology;  Laterality: Bilateral;- pt states was abdominal hysterectomy    LAPAROSCOPIC TUBAL LIGATION  03/21/2012   Procedure: LAPAROSCOPIC TUBAL LIGATION;  Surgeon: Emily Filbert, MD;  Location: [facility];  Service: Gynecology;  Laterality: Bilateral;   MANDIBLE FRACTURE SURGERY     Plantar Fascitis Repair Bilateral 01/2015    Family History  Problem Relation Age of Onset   Heart disease Mother    Colon polyps Mother    Diabetes Mother    Prostate cancer Father    Heart disease Father    Cancer Father        prostate   Diabetes Maternal Aunt    Diabetes Paternal Grandmother    Fibromyalgia Sister    Colon cancer Neg Hx    Breast cancer Neg Hx    Esophageal cancer Neg Hx    Rectal cancer  Neg Hx    Stomach cancer Neg Hx     Social History   Socioeconomic History   Marital status: Married    Spouse name: Not on file   Number of children: 0   Years of education: Not on file   Highest education level: Not on file  Occupational History   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    Employer: [employer]  Tobacco Use   Smoking status: Former    Packs/day: 0.00    Years: 0.00    Pack years: 0.00    Types: Cigarettes    Quit date: 02/21/1987    Years since quitting: 34.0   Smokeless tobacco: Never   Tobacco comments:    22 years ago-light smoker  Vaping Use   Vaping Use: Never used  Substance and Sexual Activity   Alcohol use: Yes    Alcohol/week: 0.0 standard drinks    Comment: occasional - hard apple cider, shot   Drug use: No   Sexual activity: Yes    Partners: Male    Birth control/protection: Surgical    Comment:  tubaligation/hysterctomy  Other Topics Concern   Not on file  Social History Narrative   Daily caffeine use   Social Determinants of Health   Financial Resource Strain: Not on file  Food Insecurity: Not on file  Transportation Needs: Not on file  Physical Activity: Not on file  Stress: Not on file  Social Connections: Not on file  Intimate Partner Violence: Not on file    Outpatient Medications Prior to Visit  Medication Sig Dispense Refill   acetaminophen (TYLENOL) 500 MG tablet Take 500 mg by mouth every 6 (six) hours as needed for mild pain.      B Complex Vitamins (VITAMIN B COMPLEX PO) Take 1 tablet by mouth daily.     busPIRone (BUSPAR) 30 MG tablet TAKE 1 TABLET BY MOUTH EVERY MORNING 1/2 TAB AT LUNCH, AND 1/2 TAB AT BEDTIME for anxiety. 180 tablet 0   escitalopram (LEXAPRO) 10 MG tablet Take 1 tablet (10 mg total) by mouth daily. For anxiety. 90 tablet 3   flintstones complete (FLINTSTONES) 60 MG chewable tablet Chew 1 tablet by mouth daily.     fluticasone (FLONASE) 50 MCG/ACT nasal spray Place 2 sprays into both nostrils daily. 16 g 2   glucose 4 GM chewable tablet Chew 1 tablet (4 g total) by mouth as needed for low blood sugar. 50 tablet 12   glucose blood (ONETOUCH VERIO) test strip Use as instructed to test blood sugar twice daily e11.9 100 each 12   ibuprofen (ADVIL) 800 MG tablet Take 1 tablet (800 mg total) by mouth every 8 (eight) hours as needed. 60 tablet 6   metFORMIN (GLUCOPHAGE XR) 500 MG 24 hr tablet Take 1 tablet (500 mg total) by mouth daily with breakfast. For diabetes 90 tablet 1   ONETOUCH DELICA LANCETS 01U MISC Use to test blood sugar once daily E11.9 100 each 1   pantoprazole (PROTONIX) 40 MG tablet Take 1 tablet (40 mg total) by mouth daily. 90 tablet 3   rosuvastatin (CRESTOR) 5 MG tablet Take 1 tablet (5 mg total) by mouth daily. For cholesterol. 90 tablet 3   No facility-administered medications prior to visit.    Allergies  Allergen Reactions    Sertraline Hcl     REACTION: vomiting, severe anxiety    Review of Systems  Constitutional:  Negative for chills and fever.  HENT:  Negative for facial swelling.  Eyes:  Positive for discharge and itching. Negative for photophobia, pain, redness and visual disturbance.  Respiratory:  Negative for cough and shortness of breath.   Cardiovascular:  Negative for chest pain.  Gastrointestinal:  Positive for nausea. Negative for abdominal pain, constipation, diarrhea and vomiting.      Objective:    Physical Exam Vitals and nursing note reviewed.  Constitutional:      Appearance: Normal appearance.  Eyes:     General: Lids are everted, no foreign bodies appreciated. Gaze aligned appropriately.     Extraocular Movements: Extraocular movements intact.     Pupils: Pupils are equal, round, and reactive to light.     Comments: Redness to upper and lower lids. Eyelashes are ectropion. Eye is not red. Skin is itching not eye its self. Mild local edema  Cardiovascular:     Rate and Rhythm: Normal rate and regular rhythm.  Pulmonary:     Effort: Pulmonary effort is normal.     Breath sounds: Normal breath sounds.  Abdominal:     General: Bowel sounds are normal. There is no distension.     Palpations: Abdomen is soft. There is no mass.     Tenderness: There is no abdominal tenderness.  Neurological:     Mental Status: She is alert.    BP (!) 148/90   Pulse 73   Temp (!) 97.3 F (36.3 C)   Resp 16   Ht 5\' 4"  (1.626 m)   Wt 169 lb 8 oz (76.9 kg)   LMP 03/24/2013   SpO2 99%   BMI 29.09 kg/m  Wt Readings from Last 3 Encounters:  02/20/21 169 lb 8 oz (76.9 kg)  01/07/21 174 lb (78.9 kg)  10/15/20 177 lb (80.3 kg)    Health Maintenance Due  Topic Date Due   Hepatitis C Screening  Never done   Zoster Vaccines- Shingrix (1 of 2) Never done   Pneumococcal Vaccine 51-34 Years old (2 - PCV) 09/23/2018   URINE MICROALBUMIN  04/16/2021    There are no preventive care reminders  to display for this patient.   Lab Results  Component Value Date   TSH 3.68 04/16/2020   Lab Results  Component Value Date   WBC 5.8 04/16/2020   HGB 12.8 04/16/2020   HCT 38.2 04/16/2020   MCV 91.4 04/16/2020   PLT 198.0 04/16/2020   Lab Results  Component Value Date   NA 136 04/16/2020   K 4.7 04/16/2020   CO2 29 04/16/2020   GLUCOSE 116 (H) 04/16/2020   BUN 16 04/16/2020   CREATININE 0.68 04/16/2020   BILITOT 0.4 04/16/2020   ALKPHOS 89 04/16/2020   AST 13 04/16/2020   ALT 16 04/16/2020   PROT 7.1 04/16/2020   ALBUMIN 4.6 04/16/2020   CALCIUM 9.3 04/16/2020   ANIONGAP 14 10/20/2017   GFR 97.64 04/16/2020   Lab Results  Component Value Date   CHOL 159 06/13/2020   Lab Results  Component Value Date   HDL 40.40 06/13/2020   Lab Results  Component Value Date   LDLCALC 90 06/13/2020   Lab Results  Component Value Date   TRIG 143.0 06/13/2020   Lab Results  Component Value Date   CHOLHDL 4 06/13/2020   Lab Results  Component Value Date   HGBA1C 7.1 (A) 01/15/2021       Assessment & Plan:   Problem List Items Addressed This Visit       Musculoskeletal and Integument   Dermatitis - Primary  Dermatitis from makeup Versus blepharitis.  Administered Depo-Medrol 40 mg IM in office to help with itching redness and some swelling.  Also gave instructions for lid cleaning in regards to blepharitis.  Did give signs and symptoms when to seek urgent or emergent health care.  Follow-up if symptoms do not improve  She was told to avoid make-up until complete resolution patient acknowledged also recommended she probably switch products if this is a recurring thing        Other   Nausea    We will send in a short course of nausea medication she can use intermittently.  Discussed with patient if this does not resolve she will need further work-up patient acknowledged. Start Zofran 4 mg ODT twice daily as needed nausea      Relevant Medications   ondansetron  (ZOFRAN-ODT) 4 MG disintegrating tablet     No orders of the defined types were placed in this encounter.  This visit occurred during the SARS-CoV-2 public health emergency.  Safety protocols were in place, including screening questions prior to the visit, additional usage of staff PPE, and extensive cleaning of exam room while observing appropriate contact time as indicated for disinfecting solutions.    Romilda Garret, NP

## 2021-02-20 NOTE — Patient Instructions (Signed)
Nice to see you today Follow up if symptoms do not improve

## 2021-02-20 NOTE — Assessment & Plan Note (Addendum)
Dermatitis from makeup Versus blepharitis.  Administered Depo-Medrol 40 mg IM in office to help with itching redness and some swelling.  Also gave instructions for lid cleaning in regards to blepharitis.  Did give signs and symptoms when to seek urgent or emergent health care.  Follow-up if symptoms do not improve  She was told to avoid make-up until complete resolution patient acknowledged also recommended she probably switch products if this is a recurring thing

## 2021-03-05 ENCOUNTER — Other Ambulatory Visit: Payer: Self-pay

## 2021-03-05 ENCOUNTER — Encounter: Payer: Self-pay | Admitting: Family Medicine

## 2021-03-05 ENCOUNTER — Ambulatory Visit (INDEPENDENT_AMBULATORY_CARE_PROVIDER_SITE_OTHER): Payer: BC Managed Care – PPO | Admitting: Family Medicine

## 2021-03-05 VITALS — BP 164/80 | HR 83 | Ht 64.0 in | Wt 169.0 lb

## 2021-03-05 DIAGNOSIS — Z01419 Encounter for gynecological examination (general) (routine) without abnormal findings: Secondary | ICD-10-CM | POA: Diagnosis not present

## 2021-03-05 NOTE — Progress Notes (Signed)
Subjective:     Hannah Rocha is a 55 y.o. female and is here for a comprehensive physical exam. The patient reports no problems. S/p LAVH    The following portions of the patient's history were reviewed and updated as appropriate: allergies, current medications, past family history, past medical history, past social history, past surgical history, and problem list.  Review of Systems Pertinent items are noted in HPI.   Objective:    BP (!) 164/80    Pulse 83    Ht 5\' 4"  (1.626 m)    Wt 169 lb (76.7 kg)    LMP 03/24/2013    BMI 29.01 kg/m  General appearance: alert, cooperative, and appears stated age Head: Normocephalic, without obvious abnormality, atraumatic Neck: no adenopathy, supple, symmetrical, trachea midline, and thyroid not enlarged, symmetric, no tenderness/mass/nodules Lungs: clear to auscultation bilaterally Breasts: normal appearance, no masses or tenderness Heart: regular rate and rhythm, S1, S2 normal, no murmur, click, rub or gallop Abdomen: soft, non-tender; bowel sounds normal; no masses,  no organomegaly Pelvic: external genitalia normal, uterus surgically absent, and vagina normal without discharge Extremities: extremities normal, atraumatic, no cyanosis or edema Pulses: 2+ and symmetric Skin: Skin color, texture, turgor normal. No rashes or lesions Lymph nodes: Cervical, supraclavicular, and axillary nodes normal. Neurologic: Grossly normal    Assessment:    Healthy female exam.      Plan:    Encounter for gynecological examination without abnormal finding - Plan: MM 3D SCREEN BREAST BILATERAL  Return in 1 year (on 03/05/2022).  See After Visit Summary for Counseling Recommendations

## 2021-03-05 NOTE — Progress Notes (Signed)
Patient presents for Annual Exam.  Vaginal HYST 2015 Last Mammogram: 09/14/20 Family Hx of Breast Cancer: None  Family Hx of Cervical/Ovarian Cancer: None  Flu Vaccine received 12/27/20  CC: NONE   Repeated B/P :177/80 P: 87  Pt advised to F/U with PCP regarding elevated B/P reading final B/P was Manual 164/80

## 2021-04-03 ENCOUNTER — Other Ambulatory Visit: Payer: Self-pay | Admitting: Primary Care

## 2021-04-03 DIAGNOSIS — F411 Generalized anxiety disorder: Secondary | ICD-10-CM

## 2021-04-04 IMAGING — MG DIGITAL SCREENING BILAT W/ TOMO W/ CAD
8 series · 8 of 24 positions shown · non-contrast
Comparison: Previous exam(s).

CLINICAL DATA: Screening.

EXAM:
DIGITAL SCREENING BILATERAL MAMMOGRAM WITH TOMO AND CAD

[L CC synth-2D]
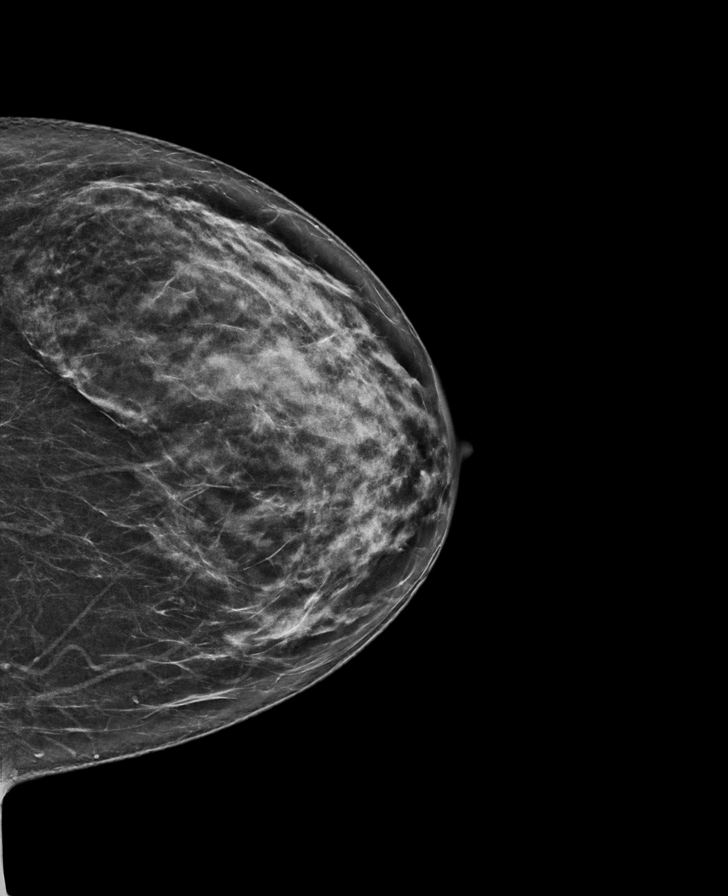

[R CC synth-2D]
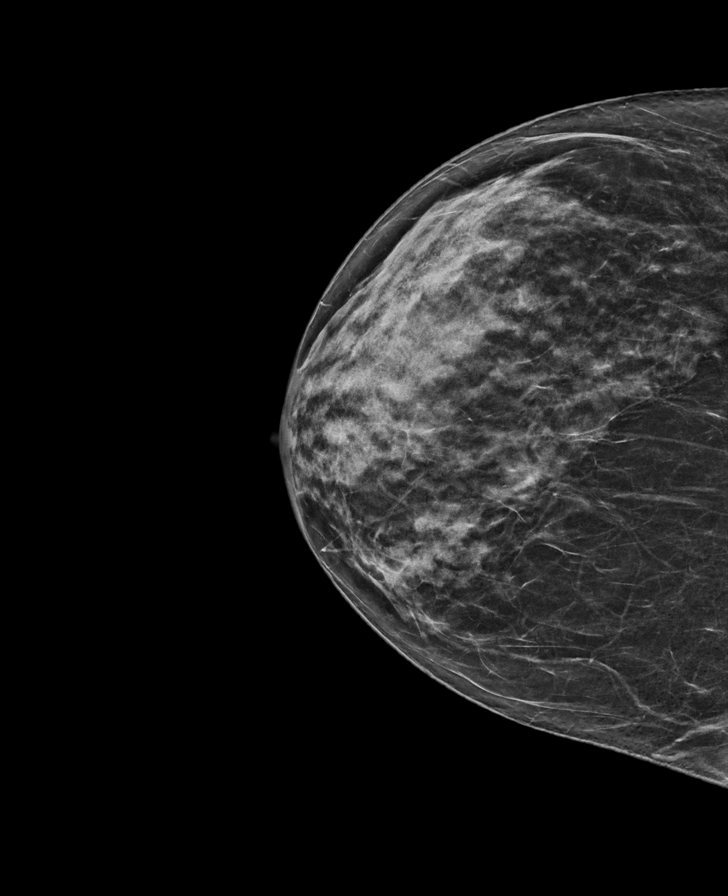

[L MLO synth-2D]
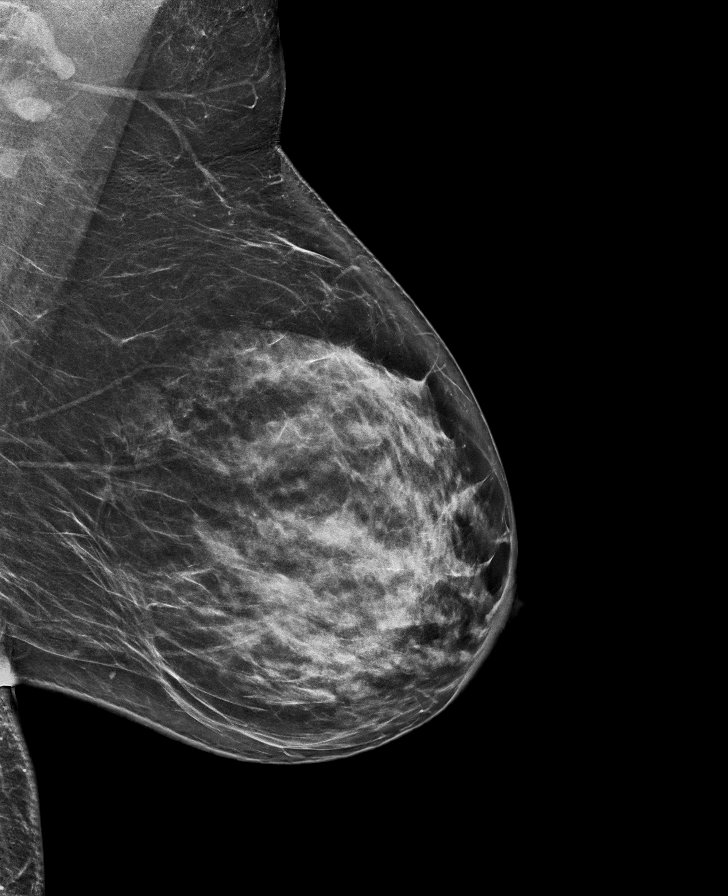

[R MLO synth-2D]
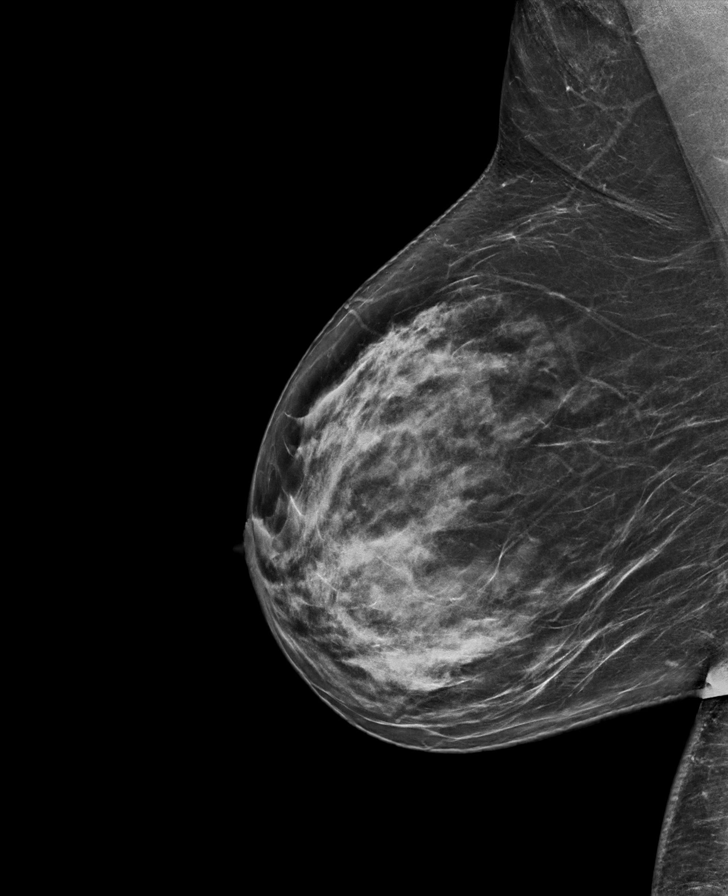

[L MLO tomo · tomo slice 41/80.0]
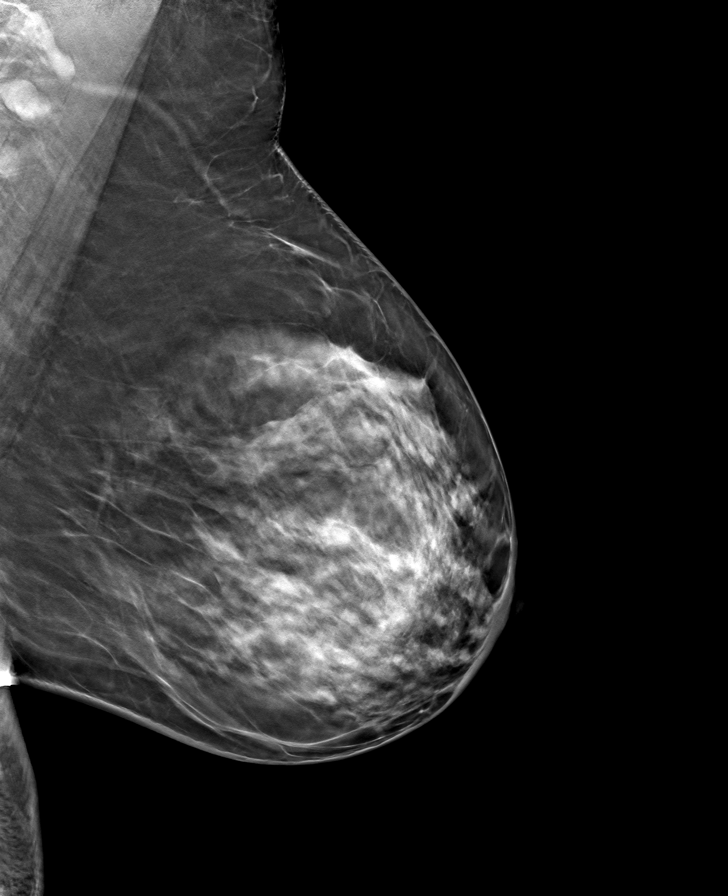

[R MLO tomo · tomo slice 37/73.0]
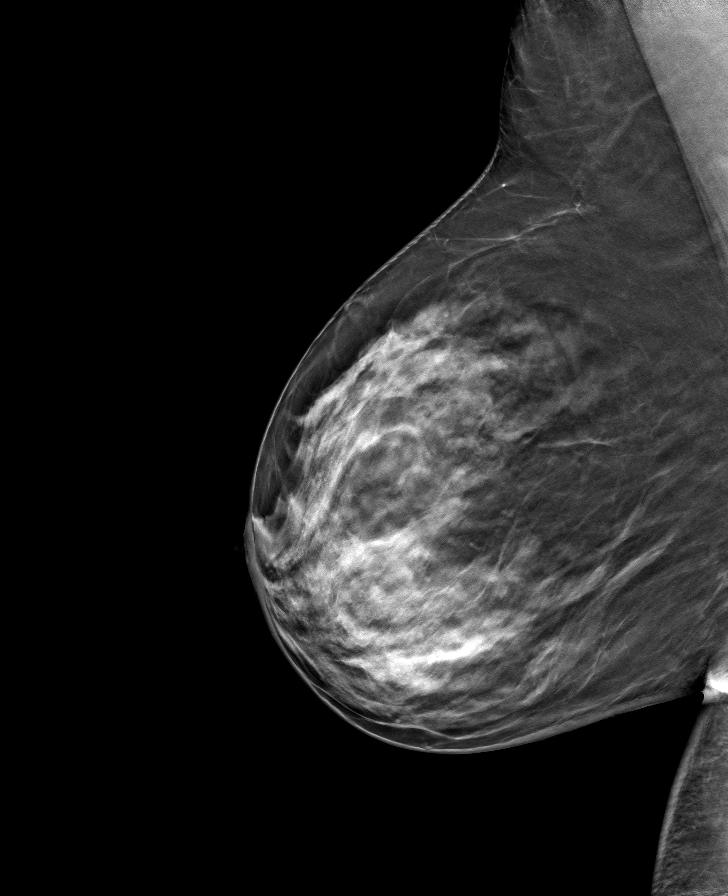

[L CC tomo · tomo slice 37/73.0]
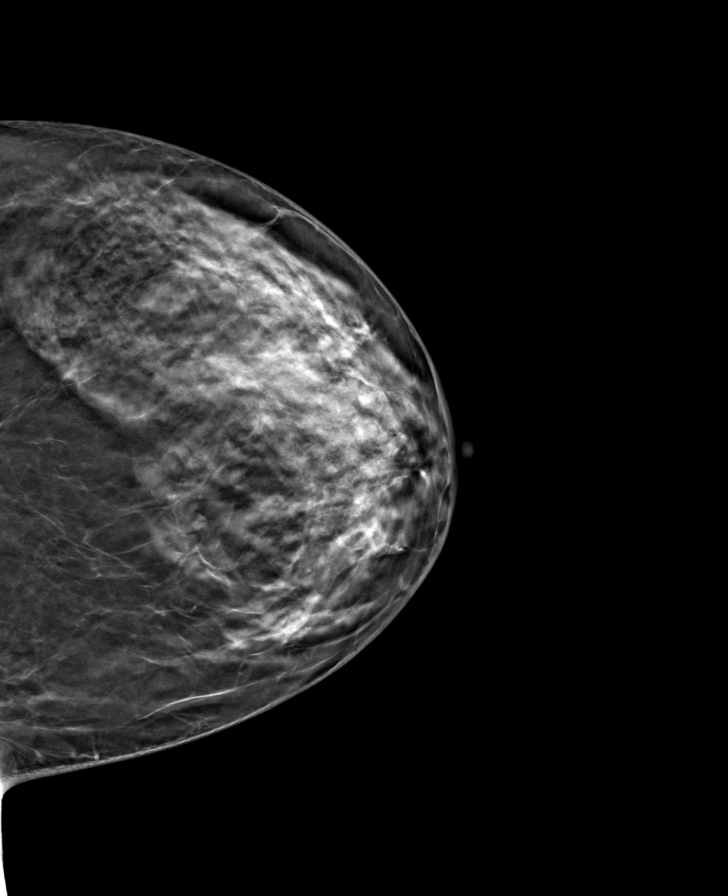

[R CC tomo · tomo slice 31/62.0]
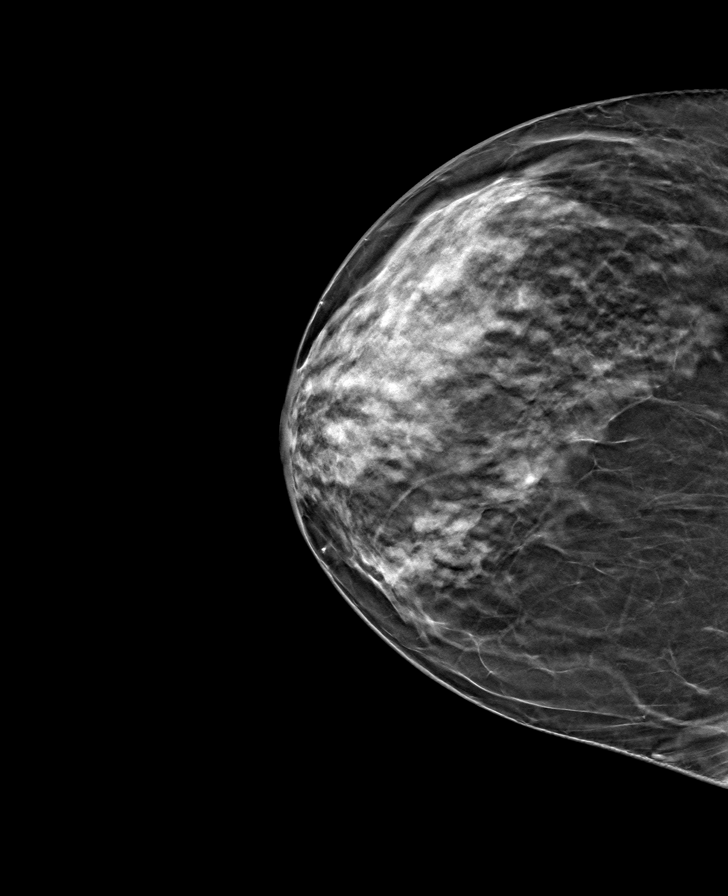

[8 of 24 positions shown; findings below may reference images not displayed]

ACR Breast Density Category c: The breast tissue is heterogeneously
dense, which may obscure small masses.
FINDINGS: There are no findings suspicious for malignancy. Images were
processed with CAD.
IMPRESSION: No mammographic evidence of malignancy. A result letter of this
screening mammogram will be mailed directly to the patient.

RECOMMENDATION:
Screening mammogram in one year. (Code:[id number]-U-LHB)

BI-RADS CATEGORY  1: Negative.

## 2021-04-09 ENCOUNTER — Other Ambulatory Visit: Payer: Self-pay | Admitting: Primary Care

## 2021-04-09 DIAGNOSIS — E785 Hyperlipidemia, unspecified: Secondary | ICD-10-CM

## 2021-04-09 DIAGNOSIS — F411 Generalized anxiety disorder: Secondary | ICD-10-CM

## 2021-04-09 DIAGNOSIS — E119 Type 2 diabetes mellitus without complications: Secondary | ICD-10-CM

## 2021-04-14 ENCOUNTER — Encounter: Payer: Self-pay | Admitting: Dermatology

## 2021-04-14 ENCOUNTER — Ambulatory Visit: Payer: BC Managed Care – PPO | Admitting: Dermatology

## 2021-04-14 ENCOUNTER — Other Ambulatory Visit: Payer: Self-pay

## 2021-04-14 DIAGNOSIS — Z1283 Encounter for screening for malignant neoplasm of skin: Secondary | ICD-10-CM | POA: Diagnosis not present

## 2021-04-14 DIAGNOSIS — D2272 Melanocytic nevi of left lower limb, including hip: Secondary | ICD-10-CM | POA: Diagnosis not present

## 2021-04-14 DIAGNOSIS — D225 Melanocytic nevi of trunk: Secondary | ICD-10-CM

## 2021-04-14 DIAGNOSIS — L821 Other seborrheic keratosis: Secondary | ICD-10-CM

## 2021-04-14 DIAGNOSIS — D2262 Melanocytic nevi of left upper limb, including shoulder: Secondary | ICD-10-CM

## 2021-04-14 DIAGNOSIS — Z86018 Personal history of other benign neoplasm: Secondary | ICD-10-CM

## 2021-04-14 DIAGNOSIS — L578 Other skin changes due to chronic exposure to nonionizing radiation: Secondary | ICD-10-CM

## 2021-04-14 DIAGNOSIS — D1800 Hemangioma unspecified site: Secondary | ICD-10-CM

## 2021-04-14 DIAGNOSIS — D229 Melanocytic nevi, unspecified: Secondary | ICD-10-CM

## 2021-04-14 DIAGNOSIS — L814 Other melanin hyperpigmentation: Secondary | ICD-10-CM

## 2021-04-14 NOTE — Patient Instructions (Signed)
Melanoma ABCDEs  Melanoma is the most dangerous type of skin cancer, and is the leading cause of death from skin disease.  You are more likely to develop melanoma if you: Have light-colored skin, light-colored eyes, or red or blond hair Spend a lot of time in the sun Tan regularly, either outdoors or in a tanning bed Have had blistering sunburns, especially during childhood Have a close family member who has had a melanoma Have atypical moles or large birthmarks  Early detection of melanoma is key since treatment is typically straightforward and cure rates are extremely high if we catch it early.   The first sign of melanoma is often a change in a mole or a new dark spot.  The ABCDE system is a way of remembering the signs of melanoma.  A for asymmetry:  The two halves do not match. B for border:  The edges of the growth are irregular. C for color:  A mixture of colors are present instead of an even brown color. D for diameter:  Melanomas are usually (but not always) greater than 51mm - the size of a pencil eraser. E for evolution:  The spot keeps changing in size, shape, and color.  Please check your skin once per month between visits. You can use a small mirror in front and a large mirror behind you to keep an eye on the back side or your body.   If you see any new or changing lesions before your next follow-up, please call to schedule a visit.  Please continue daily skin protection including broad spectrum sunscreen SPF 30+ to sun-exposed areas, reapplying every 2 hours as needed when you're outdoors.    If You Need Anything After Your Visit  If you have any questions or concerns for your doctor, please call our main line at [phone] and press option 4 to reach your doctor's medical assistant. If no one answers, please leave a voicemail as directed and we will return your call as soon as possible. Messages left after 4 pm will be answered the following business day.   You may also  send [place] a message via [place]. We typically respond to MyChart messages within 1-2 business days.  For prescription refills, please ask your pharmacy to contact our office. Our fax number is [phone].  If you have an urgent issue when the clinic is closed that cannot wait until the next business day, you can page your doctor at the number below.    Please note that while we do our best to be available for urgent issues outside of office hours, we are not available 24/7.   If you have an urgent issue and are unable to reach [place], you may choose to seek medical care at your doctor's office, retail clinic, urgent care center, or emergency room.  If you have a medical emergency, please immediately call 911 or go to the emergency department.  Pager Numbers  - Dr. Nehemiah Massed: [phone]  - Dr. Laurence Ferrari: [phone]  - Dr. Nicole Kindred: [phone]  In the event of inclement weather, please call our main line at [phone] for an update on the status of any delays or closures.  Dermatology Medication Tips: Please keep the boxes that topical medications come in in order to help keep track of the instructions about where and how to use these. Pharmacies typically print the medication instructions only on the boxes and not directly on the medication tubes.   If your medication is too expensive, please contact  our office at [phone] option 4 or send [place] a message through [place].   We are unable to tell what your co-pay for medications will be in advance as this is different depending on your insurance coverage. However, we may be able to find a substitute medication at lower cost or fill out paperwork to get insurance to cover a needed medication.   If a prior authorization is required to get your medication covered by your insurance company, please allow [place] 1-2 business days to complete this process.  Drug prices often vary depending on where the prescription is filled and some pharmacies may  offer cheaper prices.  The website www.goodrx.com contains coupons for medications through different pharmacies. The prices here do not account for what the cost may be with help from insurance (it may be cheaper with your insurance), but the website can give you the price if you did not use any insurance.  - You can print the associated coupon and take it with your prescription to the pharmacy.  - You may also stop by our office during regular business hours and pick up a GoodRx coupon card.  - If you need your prescription sent electronically to a different pharmacy, notify our office through [facility] or by phone at [phone] option 4.     Si Usted Necesita Algo Despus de Su Visita  Tambin puede enviarnos un mensaje a travs de Pharmacist, community. Por lo general respondemos a los mensajes de MyChart en el transcurso de 1 a 2 das hbiles.  Para renovar recetas, por favor pida a su farmacia que se ponga en contacto con nuestra oficina. Harland Dingwall de fax es [place] [phone].  Si tiene un asunto urgente cuando la clnica est cerrada y que no puede esperar hasta el siguiente da hbil, puede llamar/localizar a su doctor(a) al nmero que aparece a continuacin.   Por favor, tenga en cuenta que aunque hacemos todo lo posible para estar disponibles para asuntos urgentes fuera del horario de [place], no estamos disponibles las 24 horas del da, los 7 das de la [place].   Si tiene un problema urgente y no puede comunicarse con nosotros, puede optar por buscar atencin mdica  en el consultorio de su doctor(a), en una clnica privada, en un centro de atencin urgente o en una sala de emergencias.  Si tiene Engineering geologist, por favor llame inmediatamente al 911 o vaya a la sala de emergencias.  Nmeros de bper  - Dr. Nehemiah Massed: [phone]  - Dra. Moye: [phone]  - Dra. Nicole Kindred: [phone]  En caso de inclemencias del [place], por favor llame a Johnsie Kindred principal al  [phone] para una actualizacin sobre el [place] de cualquier retraso o cierre.  Consejos para la medicacin en dermatologa: Por favor, guarde las cajas en las que vienen los medicamentos de uso tpico para ayudarle a seguir las instrucciones sobre dnde y cmo usarlos. Las farmacias generalmente imprimen las instrucciones del medicamento slo en las cajas y no directamente en los tubos del [place].   Si su medicamento es muy caro, por favor, pngase en contacto con Zigmund Daniel llamando al [phone] y presione la opcin 4 o envenos un mensaje a travs de Pharmacist, community.   No podemos decirle cul ser su copago por los medicamentos por adelantado ya que esto es diferente dependiendo de la cobertura de su seguro. Sin embargo, es posible que podamos encontrar un medicamento sustituto a Electrical engineer un formulario para que el seguro cubra el medicamento  que se considera necesario.   Si se requiere una autorizacin previa para que su compaa de seguros [place] su medicamento, por favor permtanos de 1 a 2 das hbiles para completar este proceso.  Los precios de los medicamentos varan con frecuencia dependiendo del Environmental consultant de dnde se surte la receta y alguna farmacias pueden ofrecer precios ms baratos.  El sitio web www.goodrx.com tiene cupones para medicamentos de Airline pilot. Los precios aqu no tienen en cuenta lo que podra costar con la ayuda del seguro (puede ser ms barato con su seguro), pero el sitio web puede darle el precio si no utiliz Research scientist (physical sciences).  - Puede imprimir el cupn correspondiente y llevarlo con su receta a la farmacia.  - Tambin puede pasar por nuestra oficina durante el horario de atencin regular y Charity fundraiser una tarjeta de cupones de GoodRx.  - Si necesita que su receta se enve electrnicamente a una farmacia diferente, informe a nuestra oficina a travs de MyChart de [place] o por telfono llamando al [phone] y presione la opcin 4.

## 2021-04-14 NOTE — Progress Notes (Signed)
Follow-Up Visit   Subjective  Hannah Rocha is a 56 y.o. female who presents for the following: TBSE (Patient here for full body skin exam and skin cancer screening. Patient with hx of dysplastic nevi. Patient not aware of any new or changing spots. ).   The following portions of the chart were reviewed this encounter and updated as appropriate:       Review of Systems:  No other skin or systemic complaints except as noted in HPI or Assessment and Plan.  Objective  Well appearing patient in no apparent distress; mood and affect are within normal limits.  A full examination was performed including scalp, head, eyes, ears, nose, lips, neck, chest, axillae, abdomen, back, buttocks, bilateral upper extremities, bilateral lower extremities, hands, feet, fingers, toes, fingernails, and toenails. All findings within normal limits unless otherwise noted below.  left calf, left buttock, left hip, left lateral buttock, left abdomen, right spinal mid upper back Left calf 6 mm medium brown macule Left buttock 9 mm speckled brown macule with superior 2 mm brown macule Left lateral buttock 8 mm x 5 mm brown macule, appears as two adjacent Left hip 3 mm medium dark brown macule Left abdomen 1.8 x 0.9 cm pink brown plaque, appears as two adjacent Right spinal mid upper back 4 mm pink papule with adjacent 4 mm tan macule  central upper abdomen, left axilla, left posterior shoulder Pink tan papule with surrounding hypopigmentation at central upper abdomen, left axilla, left posterior shoulder    Assessment & Plan  Nevus left calf, left buttock, left hip, left lateral buttock, left abdomen, right spinal mid upper back  Benign-appearing.  Observation.  Call clinic for new or changing moles.  Recommend daily use of broad spectrum spf 30+ sunscreen to sun-exposed areas.    Halo nevus central upper abdomen, left axilla, left posterior shoulder  Benign-appearing.  Observation.  Call clinic  for new or changing lesions.  Recommend daily use of broad spectrum spf 30+ sunscreen to sun-exposed areas.     Lentigines - Scattered tan macules - Due to sun exposure - Benign-appearing, observe - Recommend daily broad spectrum sunscreen SPF 30+ to sun-exposed areas, reapply every 2 hours as needed. - Call for any changes  Seborrheic Keratoses - Stuck-on, waxy, tan-brown papules - Benign-appearing - Discussed benign etiology and prognosis. - Observe - Call for any changes  Melanocytic Nevi - Tan-brown and/or pink-flesh-colored symmetric macules and papules - Benign appearing on exam today - Observation - Call clinic for new or changing moles - Recommend daily use of broad spectrum spf 30+ sunscreen to sun-exposed areas.   Hemangiomas - Red papules - Discussed benign nature - Observe - Call for any changes  Actinic Damage - Chronic condition, secondary to cumulative UV/sun exposure - diffuse scaly erythematous macules with underlying dyspigmentation - Recommend daily broad spectrum sunscreen SPF 30+ to sun-exposed areas, reapply every 2 hours as needed.  - Staying in the shade or wearing long sleeves, sun glasses (UVA+UVB protection) and wide brim hats (4-inch brim around the entire circumference of the hat) are also recommended for sun protection.  - Call for new or changing lesions.  Skin cancer screening performed today.  History of Dysplastic Nevi - No evidence of recurrence today - Recommend regular full body skin exams - Recommend daily broad spectrum sunscreen SPF 30+ to sun-exposed areas, reapply every 2 hours as needed.  - Call if any new or changing lesions are noted between office visits  Return in  about 1 year (around 04/14/2022) for TBSE.  Graciella Belton, RMA, am acting as scribe for Brendolyn Patty, MD .  Documentation: I have reviewed the above documentation for accuracy and completeness, and I agree with the above.  Brendolyn Patty MD

## 2021-04-23 ENCOUNTER — Telehealth: Payer: Self-pay | Admitting: Primary Care

## 2021-04-23 ENCOUNTER — Other Ambulatory Visit: Payer: Self-pay

## 2021-04-23 ENCOUNTER — Encounter: Payer: Self-pay | Admitting: Primary Care

## 2021-04-23 ENCOUNTER — Ambulatory Visit (INDEPENDENT_AMBULATORY_CARE_PROVIDER_SITE_OTHER): Payer: BC Managed Care – PPO | Admitting: Primary Care

## 2021-04-23 VITALS — BP 138/84 | HR 82 | Temp 97.6°F | Ht 64.0 in | Wt 170.0 lb

## 2021-04-23 DIAGNOSIS — F411 Generalized anxiety disorder: Secondary | ICD-10-CM | POA: Diagnosis not present

## 2021-04-23 DIAGNOSIS — K219 Gastro-esophageal reflux disease without esophagitis: Secondary | ICD-10-CM | POA: Diagnosis not present

## 2021-04-23 DIAGNOSIS — Z1159 Encounter for screening for other viral diseases: Secondary | ICD-10-CM

## 2021-04-23 DIAGNOSIS — Z0000 Encounter for general adult medical examination without abnormal findings: Secondary | ICD-10-CM

## 2021-04-23 DIAGNOSIS — Z23 Encounter for immunization: Secondary | ICD-10-CM

## 2021-04-23 DIAGNOSIS — E785 Hyperlipidemia, unspecified: Secondary | ICD-10-CM | POA: Diagnosis not present

## 2021-04-23 DIAGNOSIS — E119 Type 2 diabetes mellitus without complications: Secondary | ICD-10-CM

## 2021-04-23 DIAGNOSIS — E1165 Type 2 diabetes mellitus with hyperglycemia: Secondary | ICD-10-CM | POA: Diagnosis not present

## 2021-04-23 DIAGNOSIS — R030 Elevated blood-pressure reading, without diagnosis of hypertension: Secondary | ICD-10-CM

## 2021-04-23 DIAGNOSIS — Z0001 Encounter for general adult medical examination with abnormal findings: Secondary | ICD-10-CM | POA: Insufficient documentation

## 2021-04-23 LAB — COMPREHENSIVE METABOLIC PANEL
ALT: 21 U/L (ref 0–35)
AST: 15 U/L (ref 0–37)
Albumin: 4.5 g/dL (ref 3.5–5.2)
Alkaline Phosphatase: 103 U/L (ref 39–117)
BUN: 22 mg/dL (ref 6–23)
CO2: 31 mEq/L (ref 19–32)
Calcium: 9.8 mg/dL (ref 8.4–10.5)
Chloride: 101 mEq/L (ref 96–112)
Creatinine, Ser: 0.78 mg/dL (ref 0.40–1.20)
GFR: 84.54 mL/min (ref >60.00)
Glucose, Bld: 149 mg/dL — ABNORMAL HIGH (ref 70–99)
Potassium: 4.8 mEq/L (ref 3.5–5.1)
Sodium: 137 mEq/L (ref 135–145)
Total Bilirubin: 0.4 mg/dL (ref 0.2–1.2)
Total Protein: 7.6 g/dL (ref 6.0–8.3)

## 2021-04-23 LAB — CBC
HCT: 38 % (ref 36.0–46.0)
Hemoglobin: 12.7 g/dL (ref 12.0–15.0)
MCHC: 33.5 g/dL (ref 30.0–36.0)
MCV: 89.7 fl (ref 78.0–100.0)
Platelets: 205 10*3/uL (ref 150.0–400.0)
RBC: 4.24 Mil/uL (ref 3.87–5.11)
RDW: 13.2 % (ref 11.5–15.5)
WBC: 5.1 10*3/uL (ref 4.0–10.5)

## 2021-04-23 LAB — MICROALBUMIN / CREATININE URINE RATIO
Creatinine,U: 46.4 mg/dL
Microalb Creat Ratio: 1.5 mg/g (ref 0.0–30.0)
Microalb, Ur: <0.7 mg/dL (ref 0.0–1.9)

## 2021-04-23 LAB — LIPID PANEL
Cholesterol: 157 mg/dL (ref 0–200)
HDL: 39.2 mg/dL (ref >39.00)
LDL Cholesterol: 98 mg/dL (ref 0–99)
NonHDL: 117.86
Total CHOL/HDL Ratio: 4
Triglycerides: 98 mg/dL (ref 0.0–149.0)
VLDL: 19.6 mg/dL (ref 0.0–40.0)

## 2021-04-23 LAB — HEMOGLOBIN A1C: Hgb A1c MFr Bld: 7.7 % — ABNORMAL HIGH (ref 4.6–6.5)

## 2021-04-23 MED ORDER — ROSUVASTATIN CALCIUM 5 MG PO TABS: 5.0000 mg | ORAL_TABLET | Freq: Every day | ORAL | 3 refills | Status: DC

## 2021-04-23 MED ORDER — METFORMIN HCL ER 500 MG PO TB24: 500.0000 mg | ORAL_TABLET | Freq: Every day | ORAL | 1 refills | Status: DC

## 2021-04-23 MED ORDER — ESCITALOPRAM OXALATE 10 MG PO TABS: 10.0000 mg | ORAL_TABLET | Freq: Every day | ORAL | 3 refills | Status: DC

## 2021-04-23 NOTE — Patient Instructions (Signed)
Stop by the lab prior to leaving today. I will notify you of your results once received.   Start monitoring your blood pressure daily, around the same time of day, for the next 2-3 weeks.  Ensure that you have rested for 30 minutes prior to checking your blood pressure. Record your readings and notify me if you consistently run at or above 135 on top and/or 90 on bottom.  We will see you in 6 months for a diabetes follow up.  It was a pleasure to see you today!  Preventive Care 57-91 Years Old, Female Preventive care refers to lifestyle choices and visits with your health care provider that can promote health and wellness. Preventive care visits are also called wellness exams. What can I expect for my preventive care visit? Counseling Your health care provider may ask you questions about your: Medical history, including: Past medical problems. Family medical history. Pregnancy history. Current health, including: Menstrual cycle. Method of birth control. Emotional well-being. Home life and relationship well-being. Sexual activity and sexual health. Lifestyle, including: Alcohol, nicotine or tobacco, and drug use. Access to firearms. Diet, exercise, and sleep habits. Work and work Statistician. Sunscreen use. Safety issues such as seatbelt and bike helmet use. Physical exam Your health care provider will check your: Height and weight. These may be used to calculate your BMI (body mass index). BMI is a measurement that tells if you are at a healthy weight. Waist circumference. This measures the distance around your waistline. This measurement also tells if you are at a healthy weight and may help predict your risk of certain diseases, such as type 2 diabetes and high blood pressure. Heart rate and blood pressure. Body temperature. Skin for abnormal spots. What immunizations do I need? Vaccines are usually given at various ages, according to a schedule. Your health care provider will  recommend vaccines for you based on your age, medical history, and lifestyle or other factors, such as travel or where you work. What tests do I need? Screening Your health care provider may recommend screening tests for certain conditions. This may include: Lipid and cholesterol levels. Diabetes screening. This is done by checking your blood sugar (glucose) after you have not eaten for a while (fasting). Pelvic exam and Pap test. Hepatitis B test. Hepatitis C test. HIV (human immunodeficiency virus) test. STI (sexually transmitted infection) testing, if you are at risk. Lung cancer screening. Colorectal cancer screening. Mammogram. Talk with your health care provider about when you should start having regular mammograms. This may depend on whether you have a family history of breast cancer. BRCA-related cancer screening. This may be done if you have a family history of breast, ovarian, tubal, or peritoneal cancers. Bone density scan. This is done to screen for osteoporosis. Talk with your health care provider about your test results, treatment options, and if necessary, the need for more tests. Follow these instructions at home: Eating and drinking  Eat a diet that includes fresh fruits and vegetables, whole grains, lean protein, and low-fat dairy products. Take vitamin and mineral supplements as recommended by your health care provider. Do not drink alcohol if: Your health care provider tells you not to drink. You are pregnant, may be pregnant, or are planning to become pregnant. If you drink alcohol: Limit how much you have to 0-1 drink a day. Know how much alcohol is in your drink. In the [place] one drink equals one 12 oz bottle of beer (355 mL), one 5 oz glass of wine (  148 mL), or one 1 oz glass of hard liquor (44 mL). Lifestyle Brush your teeth every morning and night with fluoride toothpaste. Floss one time each day. Exercise for at least 30 minutes 5 or more days each week. Do  not use any products that contain nicotine or tobacco. These products include cigarettes, chewing tobacco, and vaping devices, such as e-cigarettes. If you need help quitting, ask your health care provider. Do not use drugs. If you are sexually active, practice safe sex. Use a condom or other form of protection to prevent STIs. If you do not wish to become pregnant, use a form of birth control. If you plan to become pregnant, see your health care provider for a prepregnancy visit. Take aspirin only as told by your health care provider. Make sure that you understand how much to take and what form to take. Work with your health care provider to find out whether it is safe and beneficial for you to take aspirin daily. Find healthy ways to manage stress, such as: Meditation, yoga, or listening to music. Journaling. Talking to a trusted person. Spending time with friends and family. Minimize exposure to UV radiation to reduce your risk of skin cancer. Safety Always wear your seat belt while driving or riding in a vehicle. Do not drive: If you have been drinking alcohol. Do not ride with someone who has been drinking. When you are tired or distracted. While texting. If you have been using any mind-altering substances or drugs. Wear a helmet and other protective equipment during sports activities. If you have firearms in your house, make sure you follow all gun safety procedures. Seek help if you have been physically or sexually abused. What's next? Visit your health care provider once a year for an annual wellness visit. Ask your health care provider how often you should have your eyes and teeth checked. Stay up to date on all vaccines. This information is not intended to replace advice given to you by your health care provider. Make sure you discuss any questions you have with your health care provider. Document Revised: 09/04/2020 Document Reviewed: 09/04/2020 Elsevier Patient Education  [place].

## 2021-04-23 NOTE — Addendum Note (Signed)
Addended by: Francella Solian on: 04/23/2021 09:19 AM   Modules accepted: Orders

## 2021-04-23 NOTE — Assessment & Plan Note (Signed)
Glucose readings appear overall stable.  Repeat A1C pending today.  Continue metformin XR 500 mg daily. Pneumonia vaccine UTD. Managed on statin. Urine microalbumin due and pending. Eye exam due this weekend.  Follow up in 6 months.

## 2021-04-23 NOTE — Assessment & Plan Note (Signed)
Continue rosuvastatin 5 mg. Repeat lipid panel pending.

## 2021-04-23 NOTE — Progress Notes (Signed)
Subjective:    Patient ID: Hannah Rocha, female    DOB: 1965-08-20, 56 y.o.   MRN: [id number]  HPI  Hannah Rocha is a very pleasant 56 y.o. female who presents today for complete physical and follow up of chronic conditions.  She does not check BP at home. She has a family history of hypertension in one of her parents.   Immunizations: -Tetanus: 2019 -Influenza: Completed this season  -Covid-19: 2 vaccines -Shingles: Never completed  Diet: Fair diet.  Exercise: No regular exercise, but is walking more.   Eye exam: Completes annually  Dental exam: Completes semi-annually   Pap Smear: Hysterectomy  Mammogram: Completed in June 2022 Colonoscopy: Completed in 2019, due 2024  BP Readings from Last 3 Encounters:  04/23/21 138/84  03/05/21 (!) 164/80  02/20/21 (!) 148/90   Wt Readings from Last 3 Encounters:  04/23/21 170 lb (77.1 kg)  03/05/21 169 lb (76.7 kg)  02/20/21 169 lb 8 oz (76.9 kg)         Review of Systems  Constitutional:  Negative for unexpected weight change.  HENT:  Negative for rhinorrhea.   Respiratory:  Negative for cough and shortness of breath.   Cardiovascular:  Negative for chest pain.  Gastrointestinal:  Negative for constipation and diarrhea.  Genitourinary:  Negative for difficulty urinating.  Musculoskeletal:  Negative for arthralgias and myalgias.  Skin:  Negative for rash.  Allergic/Immunologic: Negative for environmental allergies.  Neurological:  Negative for dizziness and headaches.  Psychiatric/Behavioral:  The patient is not nervous/anxious.         Past Medical History:  Diagnosis Date   Allergy    Anemia    hx   Anxiety    CARDIAC MURMUR, HX OF 05/03/2007   Annotation: ASD Qualifier: Diagnosis of  By: Council Mechanic MD, Hilaria Ota    Cataract    FLOATING CATARACT    Diabetes mellitus without complication ([place])    Dysplastic nevus 01/14/2015   right spinal mid back, severe, excised 03/04/2015    Dysplastic nevus 02/08/2018   right upper back/post shoulder, moderate   Dysplastic nevus 02/08/2018   left spinal mid upper back, severe, excised 03/21/2018   GERD (gastroesophageal reflux disease)    H/O hiatal hernia    Heart murmur    ASD repair 1999- no meds    HIATAL HERNIA 07/09/2008   Qualifier: Diagnosis of  By: Nils Pyle CMA (AAMA), Leisha     Hx of dysplastic nevus    multiple sites, some severe   Hyperlipidemia    IBS 06/21/2009   Qualifier: Diagnosis of  By: Sharlett Iles MD Byrd Hesselbach    MENOPAUSAL SYNDROME 01/15/2010   Qualifier: Diagnosis of  By: Deborra Medina MD, Talia     Obesity    PONV (postoperative nausea and vomiting)    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   Annotation: REPAIRED AS CHILD Qualifier: Diagnosis of  By: Council Mechanic MD, Hilaria Ota     Social History   Socioeconomic History   Marital status: Married    Spouse name: Not on file   Number of children: 0   Years of education: Not on file   Highest education level: Not on file  Occupational History   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    Employer: [employer]  Tobacco Use   Smoking status: Former    Packs/day: 0.00    Years: 0.00    Pack years: 0.00    Types: Cigarettes    Quit date:  02/21/1987    Years since quitting: 34.1   Smokeless tobacco: Never   Tobacco comments:    22 years ago-light smoker  Vaping Use   Vaping Use: Never used  Substance and Sexual Activity   Alcohol use: Yes    Alcohol/week: 0.0 standard drinks    Comment: occasional - hard apple cider, shot   Drug use: No   Sexual activity: Yes    Partners: Male    Birth control/protection: Surgical    Comment: tubaligation/hysterctomy  Other Topics Concern   Not on file  Social History Narrative   Daily caffeine use   Social Determinants of Health   Financial Resource Strain: Not on file  Food Insecurity: Not on file  Transportation Needs: Not on file  Physical Activity: Not on file  Stress: Not on  file  Social Connections: Not on file  Intimate Partner Violence: Not on file    Past Surgical History:  Procedure Laterality Date   ASD REPAIR  1999   [facility]   BREAST CYST ASPIRATION Right 2013   CLEFT LIP [place] Bilateral 04/13/2013   Procedure: LAPAROSCOPIC ASSISTED VAGINAL HYSTERECTOMY;  Surgeon: Emily Filbert, MD;  Location: [facility];  Service: Gynecology;  Laterality: Bilateral;- pt states was abdominal hysterectomy    LAPAROSCOPIC TUBAL LIGATION  03/21/2012   Procedure: LAPAROSCOPIC TUBAL LIGATION;  Surgeon: Emily Filbert, MD;  Location: [facility];  Service: Gynecology;  Laterality: Bilateral;   MANDIBLE FRACTURE SURGERY     Plantar Fascitis Repair Bilateral 01/2015    Family History  Problem Relation Age of Onset   Heart disease Mother    Colon polyps Mother    Diabetes Mother    Prostate cancer Father    Heart disease Father    Cancer Father        prostate   Diabetes Maternal Aunt    Diabetes Paternal Grandmother    Fibromyalgia Sister    Colon cancer Neg Hx    Breast cancer Neg Hx    Esophageal cancer Neg Hx    Rectal cancer Neg Hx    Stomach cancer Neg Hx     Allergies  Allergen Reactions   Sertraline Hcl     REACTION: vomiting, severe anxiety    Current Outpatient Medications on File Prior to Visit  Medication Sig Dispense Refill   acetaminophen (TYLENOL) 500 MG tablet Take 500 mg by mouth every 6 (six) hours as needed for mild pain.      B Complex Vitamins (VITAMIN B COMPLEX PO) Take 1 tablet by mouth daily.     busPIRone (BUSPAR) 30 MG tablet TAKE 1 TABLET BY MOUTH EVERY MORNING 1/2 TAB AT LUNCH, AND 1/2 TAB AT BEDTIME for anxiety. Office visit required for further refills. 180 tablet 0   escitalopram (LEXAPRO) 10 MG tablet Take 1 tablet (10 mg total) by mouth daily. For anxiety. Office visit required for further refills. 30 tablet 0   flintstones complete (FLINTSTONES) 60 MG chewable tablet Chew 1  tablet by mouth daily.     fluticasone (FLONASE) 50 MCG/ACT nasal spray Place 2 sprays into both nostrils daily. 16 g 2   glucose 4 GM chewable tablet Chew 1 tablet (4 g total) by mouth as needed for low blood sugar. 50 tablet 12   glucose blood (ONETOUCH VERIO) test strip Use as instructed to test blood sugar twice daily e11.9 100 each 12   ibuprofen (ADVIL) 800  MG tablet Take 1 tablet (800 mg total) by mouth every 8 (eight) hours as needed. 60 tablet 6   metFORMIN (GLUCOPHAGE-XR) 500 MG 24 hr tablet Take 1 tablet (500 mg total) by mouth daily with breakfast. For diabetes. Office visit required for further refills. 30 tablet 0   ONETOUCH DELICA LANCETS 97W MISC Use to test blood sugar once daily E11.9 100 each 1   pantoprazole (PROTONIX) 40 MG tablet Take 1 tablet (40 mg total) by mouth daily. 90 tablet 3   rosuvastatin (CRESTOR) 5 MG tablet Take 1 tablet (5 mg total) by mouth daily. For cholesterol. Office visit required for further refills. 30 tablet 0   No current facility-administered medications on file prior to visit.    BP 138/84    Pulse 82    Temp 97.6 F (36.4 C) (Temporal)    Ht 5\' 4"  (1.626 m)    Wt 170 lb (77.1 kg)    LMP 03/24/2013    SpO2 97%    BMI 29.18 kg/m  Objective:   Physical Exam HENT:     Right Ear: Tympanic membrane and ear canal normal.     Left Ear: Tympanic membrane and ear canal normal.     Nose: Nose normal.  Eyes:     Conjunctiva/sclera: Conjunctivae normal.     Pupils: Pupils are equal, round, and reactive to light.  Neck:     Thyroid: No thyromegaly.  Cardiovascular:     Rate and Rhythm: Normal rate and regular rhythm.     Heart sounds: No murmur heard. Pulmonary:     Effort: Pulmonary effort is normal.     Breath sounds: Normal breath sounds. No rales.  Abdominal:     General: Bowel sounds are normal.     Palpations: Abdomen is soft.     Tenderness: There is no abdominal tenderness.  Musculoskeletal:        General: Normal range of motion.      Cervical back: Neck supple.  Lymphadenopathy:     Cervical: No cervical adenopathy.  Skin:    General: Skin is warm and dry.     Findings: No rash.  Neurological:     Mental Status: She is alert and oriented to person, place, and time.     Cranial Nerves: No cranial nerve deficit.     Deep Tendon Reflexes: Reflexes are normal and symmetric.  Psychiatric:        Mood and Affect: Mood normal.          Assessment & Plan:      This visit occurred during the SARS-CoV-2 public health emergency.  Safety protocols were in place, including screening questions prior to the visit, additional usage of staff PPE, and extensive cleaning of exam room while observing appropriate contact time as indicated for disinfecting solutions.

## 2021-04-23 NOTE — Assessment & Plan Note (Signed)
shingrix due, provided today. Other vaccines UTD. Mammogram UTD. Colonoscopy UTD, due 2024.  Discussed the importance of a healthy diet and regular exercise in order for weight loss, and to reduce the risk of further co-morbidity.  Exam today stable. Labs pending.

## 2021-04-23 NOTE — Telephone Encounter (Signed)
Noted, will await her home readings

## 2021-04-23 NOTE — Assessment & Plan Note (Signed)
Controlled. Patient satisfied with current regimen.   Continue Buspar 30 mg in AM, 15 mg in afternoon, and 15 mg in PM.   Continue Lexapro 10 mg HS.

## 2021-04-23 NOTE — Assessment & Plan Note (Signed)
Controlled.  Continue pantoprazole 40 mg daily. Follows with GI. 

## 2021-04-23 NOTE — Telephone Encounter (Signed)
Pt called wanting to follow up from her appt this morning about high BP

## 2021-04-23 NOTE — Assessment & Plan Note (Signed)
Elevated today, improved on recheck but still borderline. Prior office visit readings elevated.  She will purchase a home BP cuff and start monitoring. She will notify us if she consistently sees readings at or above 785 systolic and/or 90 diastolic.  Consider adding losartan 25 mg if warranted.

## 2021-04-23 NOTE — Telephone Encounter (Signed)
Patient called states that she did have a little of head ache in temples after she got home. She will continue to monitor her bp for two weeks and let us know if she has any increased symptoms.

## 2021-04-24 ENCOUNTER — Other Ambulatory Visit: Payer: Self-pay | Admitting: Nurse Practitioner

## 2021-04-24 ENCOUNTER — Telehealth: Payer: Self-pay | Admitting: Nurse Practitioner

## 2021-04-24 DIAGNOSIS — E119 Type 2 diabetes mellitus without complications: Secondary | ICD-10-CM

## 2021-04-24 LAB — HEPATITIS C ANTIBODY
Hepatitis C Ab: NONREACTIVE
SIGNAL TO CUT-OFF: 0.02 (ref <1.00)

## 2021-04-24 MED ORDER — METFORMIN HCL ER 500 MG PO TB24: 1000.0000 mg | ORAL_TABLET | Freq: Every day | ORAL | 0 refills | Status: DC

## 2021-04-24 NOTE — Telephone Encounter (Signed)
You were out of office. Script was sent in for metformin 500mg  XR 2 tablets in the morning with breakfast. I closed that message in your box but want you aware.

## 2021-04-24 NOTE — Progress Notes (Signed)
Pt is aware of the change.

## 2021-04-26 LAB — HM DIABETES EYE EXAM

## 2021-05-08 ENCOUNTER — Encounter: Payer: Self-pay | Admitting: Primary Care

## 2021-05-20 DIAGNOSIS — I10 Essential (primary) hypertension: Secondary | ICD-10-CM

## 2021-05-29 ENCOUNTER — Telehealth: Payer: Self-pay | Admitting: Primary Care

## 2021-05-29 MED ORDER — HYDROCHLOROTHIAZIDE 12.5 MG PO TABS: 12.5000 mg | ORAL_TABLET | Freq: Every day | ORAL | 0 refills | Status: DC

## 2021-05-29 NOTE — Telephone Encounter (Signed)
Pt called in requesting to know when should she schedule for next shingles shot also need to follow up with BP reading . Please Advise [phone]  ?

## 2021-06-02 NOTE — Telephone Encounter (Signed)
Called patient let her know that we will give her that vaccination at her next office visit. In August. Patient did not need any further advise about her blood pressure states that was addressed with Anda Kraft via my chart message over the weekend.  No further action needed at this time.  ?

## 2021-06-25 ENCOUNTER — Ambulatory Visit: Payer: BC Managed Care – PPO | Admitting: Primary Care

## 2021-06-25 ENCOUNTER — Encounter: Payer: Self-pay | Admitting: Primary Care

## 2021-06-25 VITALS — BP 148/82 | HR 82 | Ht 64.0 in | Wt 170.6 lb

## 2021-06-25 DIAGNOSIS — R002 Palpitations: Secondary | ICD-10-CM | POA: Diagnosis not present

## 2021-06-25 DIAGNOSIS — Z8774 Personal history of (corrected) congenital malformations of heart and circulatory system: Secondary | ICD-10-CM | POA: Diagnosis not present

## 2021-06-25 DIAGNOSIS — I10 Essential (primary) hypertension: Secondary | ICD-10-CM

## 2021-06-25 HISTORY — DX: Palpitations: R00.2

## 2021-06-25 MED ORDER — LOSARTAN POTASSIUM 50 MG PO TABS: 50.0000 mg | ORAL_TABLET | Freq: Every day | ORAL | 0 refills | Status: DC

## 2021-06-25 NOTE — Assessment & Plan Note (Signed)
No improvement despite HCTZ 12.5 mg daily.  Now also with side effects of headaches and GERD. ? ?Stop HCTZ 12.5 mg.  Start losartan 50 mg daily. ? ?We will plan to see her back in 2 to 3 weeks for blood pressure check and BMP. ?

## 2021-06-25 NOTE — Progress Notes (Signed)
? ?Subjective:  ? ? Patient ID: Hannah Rocha, female    DOB: 1966-01-10, 56 y.o.   MRN: [id number] ? ?HPI ? ?Hannah Rocha is a very pleasant 56 y.o. female with a history of hypertension, GERD, type 2 diabetes, hyperlipidemia who presents today for follow-up of blood pressure. ? ?She was last evaluated on 04/23/2021 for her CPE, blood pressure was noted to be elevated during her visit and also during prior visits.  She was asked to monitor her blood pressure at home for which revealed elevated blood pressure readings ranging 130s to 160s over 70s to 80s. Given these readings we initiated HCTZ 12.5 mg and asked her to follow up. ? ?Since her last visit she's developed headaches for which she did not experience previously. She is compliant to HCTZ 12.5 mg daily. She's increased her activity level, has reduced salt intake, increased water intake.  ? ?She's been checking BP at home and is getting readings of 130's-140's/70's-80's.  ? ?History of ASD repair in 1999 for a large opening. She questions whether she needs evaluation of the repair given symptoms of intermittent heart fluttering occurs several times monthly lasting a few seconds. She denies exertional shortness of breath or chest pain. She's not had an updated echocardiogram since her surgery.  ? ?BP Readings from Last 3 Encounters:  ?06/25/21 (!) 148/82  ?04/23/21 138/84  ?03/05/21 (!) 164/80  ? ? ? ? ?Review of Systems  ?Constitutional:  Negative for diaphoresis.  ?Respiratory:  Negative for shortness of breath.   ?Cardiovascular:  Negative for chest pain.  ?     See HPI  ?Neurological:  Positive for headaches. Negative for dizziness.  ? ?   ? ? ?Past Medical History:  ?Diagnosis Date  ? Allergy   ? Anemia   ? hx  ? Anxiety   ? CARDIAC MURMUR, HX OF 05/03/2007  ? Annotation: ASD Qualifier: Diagnosis of  By: Council Mechanic MD, Hilaria Ota   ? Cataract   ? FLOATING CATARACT   ? Diabetes mellitus without complication ([place])   ? Dysplastic nevus  01/14/2015  ? right spinal mid back, severe, excised 03/04/2015  ? Dysplastic nevus 02/08/2018  ? right upper back/post shoulder, moderate  ? Dysplastic nevus 02/08/2018  ? left spinal mid upper back, severe, excised 03/21/2018  ? GERD (gastroesophageal reflux disease)   ? H/O hiatal hernia   ? Heart murmur   ? ASD repair 1999- no meds   ? HIATAL HERNIA 07/09/2008  ? Qualifier: Diagnosis of  By: Nils Pyle CMA Deborra Medina), Mearl Latin    ? Hx of dysplastic nevus   ? multiple sites, some severe  ? Hyperlipidemia   ? IBS 06/21/2009  ? Qualifier: Diagnosis of  By: Sharlett Iles MD Cline Cools R   ? MENOPAUSAL SYNDROME 01/15/2010  ? Qualifier: Diagnosis of  By: Deborra Medina MD, [place]    ? Obesity   ? PONV (postoperative nausea and vomiting)   ? 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 Annotation: REPAIRED AS CHILD Qualifier: Diagnosis of  By: Council Mechanic MD, Hilaria Ota   ? ? ?Social History  ? ?Socioeconomic History  ? Marital status: Married  ?  Spouse name: Not on file  ? Number of children: 0  ? Years of education: Not on file  ? Highest education level: Not on file  ?Occupational History  ? 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  ?  Employer: [employer]  ?Tobacco Use  ? Smoking status: Former  ?  Packs/day: 0.00  ?  Years: 0.00  ?  Pack years: 0.00  ?  Types: Cigarettes  ?  Quit date: 02/21/1987  ?  Years since quitting: 34.3  ? Smokeless tobacco: Never  ? Tobacco comments:  ?  22 years ago-light smoker  ?Vaping Use  ? Vaping Use: Never used  ?Substance and Sexual Activity  ? Alcohol use: Yes  ?  Alcohol/week: 0.0 standard drinks  ?  Comment: occasional - hard apple cider, shot  ? Drug use: No  ? Sexual activity: Yes  ?  Partners: Male  ?  Birth control/protection: Surgical  ?  Comment: tubaligation/hysterctomy  ?Other Topics Concern  ? Not on file  ?Social History Narrative  ? Daily caffeine use  ? ?Social Determinants of Health  ? ?Financial Resource Strain: Not on file  ?Food Insecurity: Not on file  ?Transportation  Needs: Not on file  ?Physical Activity: Not on file  ?Stress: Not on file  ?Social Connections: Not on file  ?Intimate Partner Violence: Not on file  ? ? ?Past Surgical History:  ?Procedure Laterality Date  ? ASD REPAIR  1999  ? [facility]  ? BREAST CYST ASPIRATION Right 2013  ? CLEFT LIP REPAIR    ? COLONOSCOPY    ? LAPAROSCOPIC ASSISTED VAGINAL HYSTERECTOMY Bilateral 04/13/2013  ? Procedure: LAPAROSCOPIC ASSISTED VAGINAL HYSTERECTOMY;  Surgeon: Emily Filbert, MD;  Location: [facility];  Service: Gynecology;  Laterality: Bilateral;- pt states was abdominal hysterectomy   ? LAPAROSCOPIC TUBAL LIGATION  03/21/2012  ? Procedure: LAPAROSCOPIC TUBAL LIGATION;  Surgeon: Emily Filbert, MD;  Location: [facility];  Service: Gynecology;  Laterality: Bilateral;  ? MANDIBLE FRACTURE SURGERY    ? Plantar Fascitis Repair Bilateral 01/2015  ? ? ?Family History  ?Problem Relation Age of Onset  ? Heart disease Mother   ? Colon polyps Mother   ? Diabetes Mother   ? Prostate cancer Father   ? Heart disease Father   ? Cancer Father   ?     prostate  ? Diabetes Maternal Aunt   ? Diabetes Paternal Grandmother   ? Fibromyalgia Sister   ? Colon cancer Neg Hx   ? Breast cancer Neg Hx   ? Esophageal cancer Neg Hx   ? Rectal cancer Neg Hx   ? Stomach cancer Neg Hx   ? ? ?Allergies  ?Allergen Reactions  ? Sertraline Hcl   ?  REACTION: vomiting, severe anxiety  ? ? ?Current Outpatient Medications on File Prior to Visit  ?Medication Sig Dispense Refill  ? acetaminophen (TYLENOL) 500 MG tablet Take 500 mg by mouth every 6 (six) hours as needed for mild pain.     ? B Complex Vitamins (VITAMIN B COMPLEX PO) Take 1 tablet by mouth daily.    ? busPIRone (BUSPAR) 30 MG tablet TAKE 1 TABLET BY MOUTH EVERY MORNING 1/2 TAB AT LUNCH, AND 1/2 TAB AT BEDTIME for anxiety. Office visit required for further refills. 180 tablet 0  ? escitalopram (LEXAPRO) 10 MG tablet Take 1 tablet (10 mg total) by mouth daily. For anxiety. 90 tablet 3  ? flintstones complete (FLINTSTONES) 60 MG  chewable tablet Chew 1 tablet by mouth daily.    ? fluticasone (FLONASE) 50 MCG/ACT nasal spray Place 2 sprays into both nostrils daily. 16 g 2  ? glucose 4 GM chewable tablet Chew 1 tablet (4 g total) by mouth as needed for low blood sugar. 50 tablet 12  ? glucose blood (ONETOUCH VERIO) test strip Use as instructed to test blood  sugar twice daily e11.9 100 each 12  ? ibuprofen (ADVIL) 800 MG tablet Take 1 tablet (800 mg total) by mouth every 8 (eight) hours as needed. 60 tablet 6  ? metFORMIN (GLUCOPHAGE-XR) 500 MG 24 hr tablet Take 2 tablets (1,000 mg total) by mouth daily with breakfast. For diabetes. 180 tablet 0  ? ONETOUCH DELICA LANCETS 81E MISC Use to test blood sugar once daily E11.9 100 each 1  ? pantoprazole (PROTONIX) 40 MG tablet Take 1 tablet (40 mg total) by mouth daily. 90 tablet 3  ? rosuvastatin (CRESTOR) 5 MG tablet Take 1 tablet (5 mg total) by mouth daily. For cholesterol. 90 tablet 3  ? ?No current facility-administered medications on file prior to visit.  ? ? ?BP (!) 148/82   Pulse 82   Ht '5\' 4"'$  (1.626 m)   Wt 170 lb 9.6 oz (77.4 kg)   LMP 03/24/2013   SpO2 97%   BMI 29.28 kg/m?  ?Objective:  ? Physical Exam ?Cardiovascular:  ?   Rate and Rhythm: Normal rate and regular rhythm.  ?Pulmonary:  ?   Effort: Pulmonary effort is normal.  ?   Breath sounds: Normal breath sounds.  ?Musculoskeletal:  ?   Cervical back: Neck supple.  ?Skin: ?   General: Skin is warm and dry.  ? ? ? ? ? ?   ?Assessment & Plan:  ? ? ? ? ?This visit occurred during the SARS-CoV-2 public health emergency.  Safety protocols were in place, including screening questions prior to the visit, additional usage of staff PPE, and extensive cleaning of exam room while observing appropriate contact time as indicated for disinfecting solutions.  ?

## 2021-06-25 NOTE — Assessment & Plan Note (Addendum)
Exam today unremarkable. ? ?ECG with NSR with rate of 71, right bundle branch block which is a new finding compared to ECG from 2019.  No PAC/PVC. ? ?We will repeat echocardiogram, orders placed. ? ?We will refer to cardiology given ECG changes with her symptoms. ?

## 2021-06-25 NOTE — Patient Instructions (Addendum)
Stop taking hydrochlorothiazide for blood pressure. ? ?Start taking losartan 50 mg daily for blood pressure. ? ?Continue to monitor your blood pressure at home. ? ?You will be contacted regarding the echocardiogram.  Please let us know if you have not been contacted within two weeks.  ? ?You will be contacted regarding your referral to cardiology.  Please let us know if you have not been contacted within two weeks.  ? ?Please schedule a follow up visit to meet back with me in 2-3 weeks for blood pressure check.  ? ?It was a pleasure to see you today! ? ? ?

## 2021-06-25 NOTE — Assessment & Plan Note (Addendum)
Echocardiogram ordered and pending.  ? ?She does have new onset hypertension, will treat. ? ?Orders placed. ?

## 2021-06-26 ENCOUNTER — Other Ambulatory Visit: Payer: Self-pay | Admitting: Primary Care

## 2021-06-26 ENCOUNTER — Other Ambulatory Visit: Payer: Self-pay | Admitting: Nurse Practitioner

## 2021-06-26 DIAGNOSIS — I10 Essential (primary) hypertension: Secondary | ICD-10-CM

## 2021-06-26 DIAGNOSIS — E119 Type 2 diabetes mellitus without complications: Secondary | ICD-10-CM

## 2021-06-26 DIAGNOSIS — F411 Generalized anxiety disorder: Secondary | ICD-10-CM

## 2021-06-26 MED ORDER — BUSPIRONE HCL 30 MG PO TABS: ORAL_TABLET | ORAL | 2 refills | Status: DC

## 2021-06-26 NOTE — Progress Notes (Signed)
CARDIOLOGY CONSULT NOTE  ? ? ? ? ? ?Patient ID: ?Hannah Rocha ?MRN: [id number] ?DOB/AGE: 57-19-1967 57 y.o. ? ?Admit date: (Not on file) ?Referring Physician: Alma Friendly NP ?Primary Physician: Pleas Koch, NP ?Primary Cardiologist: New ?Reason for Consultation: History ASD/Palpitations ? ?Active Problems: ?  * No active hospital problems. * ? ? ?HPI:  56 y.o. referred by NP Carlis Abbott for history of ASD repair and palpitations. She appears to have been followed in past by Dr Council Mechanic Surgery was in ? 1999 She has recently been started on diuretic for HTN She has history of HLD, DM-2 and GERD  ? ?Lab review shows A1c 7.7 LDL 98 normal renal/liver function ?TSH has been normal multiple times most recently a year ago ? ?She really doesn't have chest pain or palpitations on my interview ?She has had headaches and her BP not controlled She was started on Cozaar about a month ago ? 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 ?I diagnosed her ASD in 1999 and saw her back then  ? ?ROS ?All other systems reviewed and negative except as noted above ? ?Past Medical History:  ?Diagnosis Date  ? Allergy   ? Anemia   ? hx  ? Anxiety   ? CARDIAC MURMUR, HX OF 05/03/2007  ? Annotation: ASD Qualifier: Diagnosis of  By: Council Mechanic MD, Hilaria Ota   ? Cataract   ? FLOATING CATARACT   ? Diabetes mellitus without complication ([place])   ? Dysplastic nevus 01/14/2015  ? right spinal mid back, severe, excised 03/04/2015  ? Dysplastic nevus 02/08/2018  ? right upper back/post shoulder, moderate  ? Dysplastic nevus 02/08/2018  ? left spinal mid upper back, severe, excised 03/21/2018  ? GERD (gastroesophageal reflux disease)   ? H/O hiatal hernia   ? Heart murmur   ? ASD repair 1999- no meds   ? HIATAL HERNIA 07/09/2008  ? Qualifier: Diagnosis of  By: Nils Pyle CMA Deborra Medina), Mearl Latin    ? Hx of dysplastic nevus   ? multiple sites, some severe  ? Hyperlipidemia   ? IBS 06/21/2009  ?  Qualifier: Diagnosis of  By: Sharlett Iles MD Cline Cools R   ? MENOPAUSAL SYNDROME 01/15/2010  ? Qualifier: Diagnosis of  By: Deborra Medina MD, [place]    ? Obesity   ? PONV (postoperative nausea and vomiting)   ? 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 Annotation: REPAIRED AS CHILD Qualifier: Diagnosis of  By: Council Mechanic MD, Hilaria Ota   ?  ?Family History  ?Problem Relation Age of Onset  ? Heart disease Mother   ? Colon polyps Mother   ? Diabetes Mother   ? Prostate cancer Father   ? Heart disease Father   ? Cancer Father   ?     prostate  ? Diabetes Maternal Aunt   ? Diabetes Paternal Grandmother   ? Fibromyalgia Sister   ? Colon cancer Neg Hx   ? Breast cancer Neg Hx   ? Esophageal cancer Neg Hx   ? Rectal cancer Neg Hx   ? Stomach cancer Neg Hx   ?  ?Social History  ? ?Socioeconomic History  ? Marital status: Married  ?  Spouse name: Not on file  ? Number of children: 0  ? Years of education: Not on file  ? Highest education level: Not on file  ?Occupational History  ? 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  ?  Employer: [employer]  ?Tobacco Use  ? Smoking status: Former  ?  Packs/day: 0.00  ?  Years: 0.00  ?  Pack years: 0.00  ?  Types: Cigarettes  ?  Quit date: 02/21/1987  ?  Years since quitting: 34.3  ? Smokeless tobacco: Never  ? Tobacco comments:  ?  22 years ago-light smoker  ?Vaping Use  ? Vaping Use: Never used  ?Substance and Sexual Activity  ? Alcohol use: Yes  ?  Alcohol/week: 0.0 standard drinks  ?  Comment: occasional - hard apple cider, shot  ? Drug use: No  ? Sexual activity: Yes  ?  Partners: Male  ?  Birth control/protection: Surgical  ?  Comment: tubaligation/hysterctomy  ?Other Topics Concern  ? Not on file  ?Social History Narrative  ? Daily caffeine use  ? ?Social Determinants of Health  ? ?Financial Resource Strain: Not on file  ?Food Insecurity: Not on file  ?Transportation Needs: Not on file  ?Physical Activity: Not on file  ?Stress: Not on file  ?Social Connections: Not on file   ?Intimate Partner Violence: Not on file  ?  ?Past Surgical History:  ?Procedure Laterality Date  ? ASD REPAIR  1999  ? [facility]  ? BREAST CYST ASPIRATION Right 2013  ? CLEFT LIP REPAIR    ? COLONOSCOPY    ? LAPAROSCOPIC ASSISTED VAGINAL HYSTERECTOMY Bilateral 04/13/2013  ? Procedure: LAPAROSCOPIC ASSISTED VAGINAL HYSTERECTOMY;  Surgeon: Emily Filbert, MD;  Location: [facility];  Service: Gynecology;  Laterality: Bilateral;- pt states was abdominal hysterectomy   ? LAPAROSCOPIC TUBAL LIGATION  03/21/2012  ? Procedure: LAPAROSCOPIC TUBAL LIGATION;  Surgeon: Emily Filbert, MD;  Location: [facility];  Service: Gynecology;  Laterality: Bilateral;  ? MANDIBLE FRACTURE SURGERY    ? Plantar Fascitis Repair Bilateral 01/2015  ?  ? ? ?Current Outpatient Medications:  ?  acetaminophen (TYLENOL) 500 MG tablet, Take 500 mg by mouth every 6 (six) hours as needed for mild pain. , Disp: , Rfl:  ?  B Complex Vitamins (VITAMIN B COMPLEX PO), Take 1 tablet by mouth daily., Disp: , Rfl:  ?  busPIRone (BUSPAR) 30 MG tablet, TAKE 1 TABLET BY MOUTH EVERY MORNING 1/2 TAB AT LUNCH, AND 1/2 TAB AT BEDTIME for anxiety., Disp: 180 tablet, Rfl: 2 ?  escitalopram (LEXAPRO) 10 MG tablet, Take 1 tablet (10 mg total) by mouth daily. For anxiety., Disp: 90 tablet, Rfl: 3 ?  flintstones complete (FLINTSTONES) 60 MG chewable tablet, Chew 1 tablet by mouth daily., Disp: , Rfl:  ?  fluticasone (FLONASE) 50 MCG/ACT nasal spray, Place 2 sprays into both nostrils daily., Disp: 16 g, Rfl: 2 ?  glucose 4 GM chewable tablet, Chew 1 tablet (4 g total) by mouth as needed for low blood sugar., Disp: 50 tablet, Rfl: 12 ?  glucose blood (ONETOUCH VERIO) test strip, Use as instructed to test blood sugar twice daily e11.9, Disp: 100 each, Rfl: 12 ?  ibuprofen (ADVIL) 800 MG tablet, Take 1 tablet (800 mg total) by mouth every 8 (eight) hours as needed., Disp: 60 tablet, Rfl: 6 ?  losartan (COZAAR) 50 MG tablet, Take 1 tablet (50 mg total) by mouth daily. for blood pressure., Disp:  30 tablet, Rfl: 0 ?  metFORMIN (GLUCOPHAGE-XR) 500 MG 24 hr tablet, Take 2 tablets (1,000 mg total) by mouth daily with breakfast. For diabetes., Disp: 180 tablet, Rfl: 0 ?  ONETOUCH DELICA LANCETS 79K MISC, Use to test blood sugar once daily E11.9, Disp: 100 each, Rfl:  1 ?  pantoprazole (PROTONIX) 40 MG tablet, Take 1 tablet (40 mg total) by mouth daily., Disp: 90 tablet, Rfl: 3 ?  rosuvastatin (CRESTOR) 5 MG tablet, Take 1 tablet (5 mg total) by mouth daily. For cholesterol., Disp: 90 tablet, Rfl: 3 ? ? ? ?Physical Exam: ?Last menstrual period 03/24/2013.   ? ?Affect appropriate ?Healthy:  appears stated age ?HEENT: normal ?Neck supple with no adenopathy ?JVP normal no bruits no thyromegaly ?Lungs clear with no wheezing and good diaphragmatic motion ?Heart:  S1/S2 SEM  murmur, no rub, gallop or click ?PMI normal Previous sternotomy    ?Abdomen: benighn, BS positve, no tenderness, no AAA ?no bruit.  No HSM or HJR ?Distal pulses intact with no bruits ?No edema ?Neuro non-focal ?Skin warm and dry ?No muscular weakness ? ? ?Labs: ?  ?Lab Results  ?Component Value Date  ? WBC 5.1 04/23/2021  ? HGB 12.7 04/23/2021  ? HCT 38.0 04/23/2021  ? MCV 89.7 04/23/2021  ? PLT 205.0 04/23/2021  ? No results for input(s): NA, K, CL, CO2, BUN, CREATININE, CALCIUM, PROT, BILITOT, ALKPHOS, ALT, AST, GLUCOSE in the last 168 hours. ? ?Invalid input(s): LABALBU ?No results found for: CKTOTAL, CKMB, CKMBINDEX, TROPONINI  ?Lab Results  ?Component Value Date  ? CHOL 157 04/23/2021  ? CHOL 159 06/13/2020  ? CHOL 223 (H) 04/16/2020  ? ?Lab Results  ?Component Value Date  ? HDL 39.20 04/23/2021  ? HDL 40.40 06/13/2020  ? HDL 45.90 04/16/2020  ? ?Lab Results  ?Component Value Date  ? [place] 98 04/23/2021  ? [place] 90 06/13/2020  ? [place] 158 (H) 04/16/2020  ? ?Lab Results  ?Component Value Date  ? TRIG 98.0 04/23/2021  ? TRIG 143.0 06/13/2020  ? TRIG 93.0 04/16/2020  ? ?Lab Results  ?Component Value Date  ? CHOLHDL 4 04/23/2021  ? CHOLHDL 4  06/13/2020  ? CHOLHDL 5 04/16/2020  ? ?Lab Results  ?Component Value Date  ? LDLDIRECT 169.0 03/09/2016  ? LDLDIRECT 87.0 02/27/2014  ? LDLDIRECT 59.3 12/29/2013  ?  ?  ?Radiology: ?No results found.

## 2021-06-27 ENCOUNTER — Ambulatory Visit: Payer: BC Managed Care – PPO | Admitting: Cardiovascular Disease

## 2021-06-27 ENCOUNTER — Encounter: Payer: Self-pay | Admitting: Cardiovascular Disease

## 2021-06-27 VITALS — BP 160/78 | HR 84 | Ht 64.0 in | Wt 171.0 lb

## 2021-06-27 DIAGNOSIS — E782 Mixed hyperlipidemia: Secondary | ICD-10-CM | POA: Diagnosis not present

## 2021-06-27 DIAGNOSIS — I10 Essential (primary) hypertension: Secondary | ICD-10-CM | POA: Diagnosis not present

## 2021-06-27 DIAGNOSIS — Q211 Atrial septal defect, unspecified: Secondary | ICD-10-CM | POA: Diagnosis not present

## 2021-06-27 MED ORDER — LOSARTAN POTASSIUM-HCTZ 100-12.5 MG PO TABS: 1.0000 | ORAL_TABLET | Freq: Every day | ORAL | 3 refills | Status: DC

## 2021-06-27 NOTE — Patient Instructions (Addendum)
Medication Instructions:  ?Your physician has recommended you make the following change in your medication:  ?1-STOP Losartan ?2-START Losartan/HCTZ 100/12.5 mg by mouth daily. ? ?*If you need a refill on your cardiac medications before your next appointment, please call your pharmacy* ? ?Lab Work: ?Your physician recommends that you return for lab work in: 2 weeks for [employer] ? ?If you have labs (blood work) drawn today and your tests are completely normal, you will receive your results only by: ?MyChart Message (if you have MyChart) OR ?A paper copy in the mail ?If you have any lab test that is abnormal or we need to change your treatment, we will call you to review the results. ? ?Testing/Procedures: ?Your physician has requested that you have an echocardiogram with bubble study already scheduled . Echocardiography is a painless test that uses sound waves to create images of your heart. It provides your doctor with information about the size and shape of your heart and how well your heart?s chambers and valves are working. This procedure takes approximately one hour. There are no restrictions for this procedure. ? ?Cardiac CT scanning for calcium score, (CAT scanning), is a noninvasive, special x-ray that produces cross-sectional images of the body using x-rays and a computer. CT scans help physicians diagnose and treat medical conditions. For some CT exams, a contrast material is used to enhance visibility in the area of the body being studied. CT scans provide greater clarity and reveal more details than regular x-ray exams. ? ?Follow-Up: ?At [facility], you and your health needs are our priority.  As part of our continuing mission to provide you with exceptional heart care, we have created designated Provider Care Teams.  These Care Teams include your primary Cardiologist (physician) and Advanced Practice Providers (APPs -  Physician Assistants and Nurse Practitioners) who all work together to provide you  with the care you need, when you need it. ? ?We recommend signing up for the patient portal called "MyChart".  Sign up information is provided on this After Visit Summary.  MyChart is used to connect with patients for Virtual Visits (Telemedicine).  Patients are able to view lab/test results, encounter notes, upcoming appointments, etc.  Non-urgent messages can be sent to your provider as well.   ?To learn more about what you can do with MyChart, go to [email or web address].   ? ?Your next appointment:   ?1 year  ? ?The format for your next appointment:   ?In Person ? ?Provider:   ?Jenkins Rouge, MD { ? ?Other Instructions ? ?

## 2021-07-02 ENCOUNTER — Other Ambulatory Visit: Payer: Self-pay | Admitting: Nurse Practitioner

## 2021-07-02 DIAGNOSIS — E119 Type 2 diabetes mellitus without complications: Secondary | ICD-10-CM

## 2021-07-03 MED ORDER — METFORMIN HCL ER 500 MG PO TB24: 1000.0000 mg | ORAL_TABLET | Freq: Every day | ORAL | 1 refills | Status: DC

## 2021-07-04 ENCOUNTER — Other Ambulatory Visit: Payer: BC Managed Care – PPO

## 2021-07-09 ENCOUNTER — Other Ambulatory Visit: Payer: Self-pay | Admitting: Primary Care

## 2021-07-09 DIAGNOSIS — E1165 Type 2 diabetes mellitus with hyperglycemia: Secondary | ICD-10-CM

## 2021-07-11 ENCOUNTER — Other Ambulatory Visit: Payer: BC Managed Care – PPO

## 2021-07-11 ENCOUNTER — Other Ambulatory Visit: Payer: BC Managed Care – PPO | Admitting: *Deleted

## 2021-07-11 DIAGNOSIS — Q211 Atrial septal defect, unspecified: Secondary | ICD-10-CM | POA: Diagnosis not present

## 2021-07-11 DIAGNOSIS — I10 Essential (primary) hypertension: Secondary | ICD-10-CM | POA: Diagnosis not present

## 2021-07-11 DIAGNOSIS — E782 Mixed hyperlipidemia: Secondary | ICD-10-CM | POA: Diagnosis not present

## 2021-07-12 LAB — BASIC METABOLIC PANEL
BUN/Creatinine Ratio: 26 — ABNORMAL HIGH (ref 9–23)
BUN: 18 mg/dL (ref 6–24)
CO2: 24 mmol/L (ref 20–29)
Calcium: 9.6 mg/dL (ref 8.7–10.2)
Chloride: 93 mmol/L — ABNORMAL LOW (ref 96–106)
Creatinine, Ser: 0.7 mg/dL (ref 0.57–1.00)
Glucose: 124 mg/dL — ABNORMAL HIGH (ref 70–99)
Potassium: 4.2 mmol/L (ref 3.5–5.2)
Sodium: 132 mmol/L — ABNORMAL LOW (ref 134–144)
eGFR: 101 mL/min/{1.73_m2} (ref >59)

## 2021-07-22 ENCOUNTER — Ambulatory Visit: Payer: BC Managed Care – PPO | Admitting: Primary Care

## 2021-07-23 ENCOUNTER — Other Ambulatory Visit: Payer: BC Managed Care – PPO

## 2021-07-23 ENCOUNTER — Other Ambulatory Visit (INDEPENDENT_AMBULATORY_CARE_PROVIDER_SITE_OTHER): Payer: BC Managed Care – PPO

## 2021-07-23 DIAGNOSIS — E1165 Type 2 diabetes mellitus with hyperglycemia: Secondary | ICD-10-CM | POA: Diagnosis not present

## 2021-07-23 LAB — POCT GLYCOSYLATED HEMOGLOBIN (HGB A1C): Hemoglobin A1C: 7.1 % — AB (ref 4.0–5.6)

## 2021-07-24 ENCOUNTER — Ambulatory Visit (HOSPITAL_COMMUNITY): Payer: BC Managed Care – PPO | Attending: Cardiology

## 2021-07-24 ENCOUNTER — Ambulatory Visit (INDEPENDENT_AMBULATORY_CARE_PROVIDER_SITE_OTHER)
Admission: RE | Admit: 2021-07-24 | Discharge: 2021-07-24 | Disposition: A | Discharge status: Home / Self Care | Payer: Self-pay | Source: Ambulatory Visit | Attending: Cardiovascular Disease | Admitting: Cardiovascular Disease

## 2021-07-24 ENCOUNTER — Other Ambulatory Visit (HOSPITAL_COMMUNITY): Payer: BC Managed Care – PPO

## 2021-07-24 DIAGNOSIS — E782 Mixed hyperlipidemia: Secondary | ICD-10-CM | POA: Diagnosis not present

## 2021-07-24 DIAGNOSIS — Q211 Atrial septal defect, unspecified: Secondary | ICD-10-CM

## 2021-07-24 DIAGNOSIS — I10 Essential (primary) hypertension: Secondary | ICD-10-CM

## 2021-07-24 LAB — ECHOCARDIOGRAM COMPLETE BUBBLE STUDY
Area-P 1/2: 3.12 cm2
S' Lateral: 3.2 cm

## 2021-08-04 ENCOUNTER — Telehealth: Payer: Self-pay

## 2021-08-04 DIAGNOSIS — E785 Hyperlipidemia, unspecified: Secondary | ICD-10-CM

## 2021-08-04 MED ORDER — ROSUVASTATIN CALCIUM 10 MG PO TABS: 10.0000 mg | ORAL_TABLET | Freq: Every day | ORAL | 3 refills | Status: DC

## 2021-08-04 NOTE — Telephone Encounter (Signed)
-----   Message from Josue Hector, MD sent at 08/03/2021  7:58 PM EDT ----- ?LDL > 70 increase crestor to 10 mg calcium score high for age f/u labs in 3 months  ?

## 2021-08-04 NOTE — Telephone Encounter (Signed)
The patient has been notified of the result and verbalized understanding.  All questions (if any) were answered. ?Hannah Barter, RN 08/04/2021 3:23 PM  ? ?Placed order for Crestor 10 mg and lab work. Patient will come in on 11/04/21 for lab work. ?

## 2021-09-19 ENCOUNTER — Telehealth: Payer: Self-pay

## 2021-09-19 ENCOUNTER — Encounter: Payer: Self-pay | Admitting: Nurse Practitioner

## 2021-09-19 ENCOUNTER — Ambulatory Visit: Payer: BC Managed Care – PPO | Admitting: Nurse Practitioner

## 2021-09-19 VITALS — BP 134/60 | HR 76 | Temp 97.5°F | Resp 10 | Ht 64.0 in | Wt 174.0 lb

## 2021-09-19 DIAGNOSIS — M6289 Other specified disorders of muscle: Secondary | ICD-10-CM | POA: Diagnosis not present

## 2021-09-19 DIAGNOSIS — R1084 Generalized abdominal pain: Secondary | ICD-10-CM | POA: Diagnosis not present

## 2021-09-19 DIAGNOSIS — N3090 Cystitis, unspecified without hematuria: Secondary | ICD-10-CM | POA: Diagnosis not present

## 2021-09-19 DIAGNOSIS — M545 Low back pain, unspecified: Secondary | ICD-10-CM | POA: Diagnosis not present

## 2021-09-19 HISTORY — DX: Other specified disorders of muscle: M62.89

## 2021-09-19 HISTORY — DX: Generalized abdominal pain: R10.84

## 2021-09-19 LAB — POC URINALSYSI DIPSTICK (AUTOMATED)
Bilirubin, UA: NEGATIVE
Blood, UA: NEGATIVE
Glucose, UA: NEGATIVE
Ketones, UA: NEGATIVE
Nitrite, UA: NEGATIVE
Protein, UA: NEGATIVE
Spec Grav, UA: 1.015 (ref 1.010–1.025)
Urobilinogen, UA: 0.2 E.U./dL
pH, UA: 6 (ref 5.0–8.0)

## 2021-09-19 MED ORDER — SULFAMETHOXAZOLE-TRIMETHOPRIM 800-160 MG PO TABS: 1.0000 | ORAL_TABLET | Freq: Two times a day (BID) | ORAL | 0 refills | Status: AC

## 2021-09-19 NOTE — Telephone Encounter (Signed)
[facility] Night - Client Nonclinical Telephone Record  [facility] Client [facility] Night - Client Client Site [place] - Night Provider Alma Friendly - NP Contact Type Call Who Is Calling Patient / Member / Family / Caregiver Caller Name Bertrand Phone Number [phone] Patient Name Hannah Rocha Patient DOB 02/21/1966 Call Type Message Only Information Provided Reason for Call Request to Schedule Office Appointment Initial Comment Caller states she wants to make an appointment today is possible for a possible kidney infection. Patient request to speak to RN No Additional Comment Please call. Disp. Time Disposition Final User 09/19/2021 7:34:46 AM General Information Provided Yes Clydene Laming, Amy Call Closed By: [place] [facility] Date/Time: 09/19/2021 7:32:10 AM (ET  Per appt notes pt already has appt scheduled with Romilda Garret NP on 09/19/21 at 3 PM. Sending note to Romilda Garret NP and Anastasiya CMA.

## 2021-09-19 NOTE — Progress Notes (Signed)
Acute Office Visit  Subjective:     Patient ID: Hannah Rocha, female    DOB: 10/16/1965, 56 y.o.   MRN: [id number]  Chief Complaint  Patient presents with   Abdominal Pain    Sx started 09/17/21-Lower abdominal pain and lower back pain, has had some chills feeling in the last 2 days. No burning or pain with urination.     Patient is in today for abdomnial pain   Started Wednesday per patient. States that it started later in the day.  States that on Monday she moved her potted plants and thought she pulled a musce but has not gotten better. She has been taking ibuprofen and tylenol. Some relief  Dullache pain that is constant BM today that was normal  Feels like a kidney infection in the past.  Review of Systems  Constitutional:  Positive for chills and malaise/fatigue. Negative for fever.       Decreased appetite   Gastrointestinal:  Positive for abdominal pain. Negative for nausea and vomiting.  Musculoskeletal:  Positive for back pain and myalgias.        Objective:    BP 134/60   Pulse 76   Temp (!) 97.5 F (36.4 C)   Resp 10   Ht '5\' 4"'$  (1.626 m)   Wt 174 lb (78.9 kg)   LMP 03/24/2013   SpO2 100%   BMI 29.87 kg/m    Physical Exam Vitals and nursing note reviewed.  Constitutional:      Appearance: She is well-developed.  Cardiovascular:     Rate and Rhythm: Normal rate and regular rhythm.     Heart sounds: Normal heart sounds.  Pulmonary:     Effort: Pulmonary effort is normal.     Breath sounds: Normal breath sounds.  Abdominal:     General: Bowel sounds are normal. There is no distension.     Palpations: There is no mass.     Tenderness: There is abdominal tenderness. There is right CVA tenderness and left CVA tenderness.     Hernia: No hernia is present.       Comments: With certain range of motion like oblique and lateral slides.  Pulling in abdomen area  Musculoskeletal:        General: Tenderness present.     Lumbar back:  Tenderness present. No bony tenderness. Negative right straight leg raise test and negative left straight leg raise test.       Back:  Neurological:     General: No focal deficit present.     Mental Status: She is alert.     Deep Tendon Reflexes:     Reflex Scores:      Patellar reflexes are 2+ on the right side and 2+ on the left side.    Comments: Bilateral lower extremity strength  5/5     Results for orders placed or performed in visit on 09/19/21  POCT Urinalysis Dipstick (Automated)  Result Value Ref Range   Color, UA yellow    Clarity, UA hazy    Glucose, UA Negative Negative   Bilirubin, UA negative    Ketones, UA negative    Spec Grav, UA 1.015 1.010 - 1.025   Blood, UA negative    pH, UA 6.0 5.0 - 8.0   Protein, UA Negative Negative   Urobilinogen, UA 0.2 0.2 or 1.0 E.U./dL   Nitrite, UA negative    Leukocytes, UA Large (3+) (A) Negative        Assessment &  Plan:   Problem List Items Addressed This Visit       Genitourinary   Cystitis    Given generalized abdominal pain along with CVA tenderness will treat with Bactrim twice daily for 7 days.  Pending urine culture      Relevant Medications   sulfamethoxazole-trimethoprim (BACTRIM DS) 800-160 MG tablet   Other Relevant Orders   Urine Culture     Other   Acute right-sided low back pain without sciatica - Primary    Over-the-counter ibuprofen is offering some relief.  Red flags on exam      Relevant Orders   POCT Urinalysis Dipstick (Automated) (Completed)   Generalized abdominal pain    Ambiguous in nature.  We will check basic labs urine did have 3+ leuks we will go ahead and treat with Bactrim twice daily for 7 days since patient had CVA tenderness on exam.      Relevant Orders   CBC   Comprehensive metabolic panel   Lipase   Muscle tightness    No finding on exam.  Continue using ibuprofen as needed       Meds ordered this encounter  Medications   sulfamethoxazole-trimethoprim (BACTRIM  DS) 800-160 MG tablet    Sig: Take 1 tablet by mouth 2 (two) times daily for 7 days.    Dispense:  14 tablet    Refill:  0    Order Specific Question:   Supervising Provider    Answer:   TOWER, MARNE A [[id number]    Return if symptoms worsen or fail to improve.  Romilda Garret, NP

## 2021-09-19 NOTE — Assessment & Plan Note (Signed)
Given generalized abdominal pain along with CVA tenderness will treat with Bactrim twice daily for 7 days.  Pending urine culture

## 2021-09-19 NOTE — Patient Instructions (Signed)
Nice to see you today I sent in the antibiotic to your pharmacy Follow up if no improvement I will be in touch with the labs and urine culture once I have them

## 2021-09-19 NOTE — Telephone Encounter (Signed)
Noted. Will evaluate in office today

## 2021-09-19 NOTE — Assessment & Plan Note (Signed)
No finding on exam.  Continue using ibuprofen as needed

## 2021-09-19 NOTE — Assessment & Plan Note (Signed)
Ambiguous in nature.  We will check basic labs urine did have 3+ leuks we will go ahead and treat with Bactrim twice daily for 7 days since patient had CVA tenderness on exam.

## 2021-09-19 NOTE — Assessment & Plan Note (Signed)
Over-the-counter ibuprofen is offering some relief.  Red flags on exam

## 2021-09-21 LAB — URINE CULTURE
MICRO NUMBER:: 13594410
SPECIMEN QUALITY:: ADEQUATE

## 2021-09-21 LAB — COMPREHENSIVE METABOLIC PANEL
AG Ratio: 1.6 (calc) (ref 1.0–2.5)
ALT: 21 U/L (ref 6–29)
AST: 15 U/L (ref 10–35)
Albumin: 4 g/dL (ref 3.6–5.1)
Alkaline phosphatase (APISO): 93 U/L (ref 37–153)
BUN: 19 mg/dL (ref 7–25)
CO2: 28 mmol/L (ref 20–32)
Calcium: 9 mg/dL (ref 8.6–10.4)
Chloride: 97 mmol/L — ABNORMAL LOW (ref 98–110)
Creat: 0.63 mg/dL (ref 0.50–1.03)
Globulin: 2.5 g/dL (calc) (ref 1.9–3.7)
Glucose, Bld: 122 mg/dL — ABNORMAL HIGH (ref 65–99)
Potassium: 4.3 mmol/L (ref 3.5–5.3)
Sodium: 132 mmol/L — ABNORMAL LOW (ref 135–146)
Total Bilirubin: 0.3 mg/dL (ref 0.2–1.2)
Total Protein: 6.5 g/dL (ref 6.1–8.1)

## 2021-09-21 LAB — CBC
HCT: 33.3 % — ABNORMAL LOW (ref 35.0–45.0)
Hemoglobin: 11.1 g/dL — ABNORMAL LOW (ref 11.7–15.5)
MCH: 30.7 pg (ref 27.0–33.0)
MCHC: 33.3 g/dL (ref 32.0–36.0)
MCV: 92 fL (ref 80.0–100.0)
MPV: 11.1 fL (ref 7.5–12.5)
Platelets: 169 10*3/uL (ref 140–400)
RBC: 3.62 10*6/uL — ABNORMAL LOW (ref 3.80–5.10)
RDW: 12.8 % (ref 11.0–15.0)
WBC: 9.3 10*3/uL (ref 3.8–10.8)

## 2021-09-21 LAB — LIPASE: Lipase: 15 U/L (ref 7–60)

## 2021-09-24 ENCOUNTER — Encounter: Payer: Self-pay | Admitting: Nurse Practitioner

## 2021-10-06 ENCOUNTER — Ambulatory Visit
Admission: RE | Admit: 2021-10-06 | Discharge: 2021-10-06 | Disposition: A | Discharge status: Home / Self Care | Payer: BC Managed Care – PPO | Source: Ambulatory Visit | Attending: Family Medicine | Admitting: Family Medicine

## 2021-10-06 DIAGNOSIS — Z01419 Encounter for gynecological examination (general) (routine) without abnormal findings: Secondary | ICD-10-CM | POA: Insufficient documentation

## 2021-10-06 DIAGNOSIS — Z1231 Encounter for screening mammogram for malignant neoplasm of breast: Secondary | ICD-10-CM | POA: Insufficient documentation

## 2021-10-08 ENCOUNTER — Other Ambulatory Visit: Payer: Self-pay | Admitting: Family Medicine

## 2021-10-08 DIAGNOSIS — R928 Other abnormal and inconclusive findings on diagnostic imaging of breast: Secondary | ICD-10-CM

## 2021-10-22 ENCOUNTER — Encounter: Payer: Self-pay | Admitting: Primary Care

## 2021-10-22 ENCOUNTER — Ambulatory Visit: Payer: BC Managed Care – PPO | Admitting: Primary Care

## 2021-10-22 VITALS — BP 138/82 | HR 67 | Temp 98.6°F | Ht 64.0 in | Wt 170.0 lb

## 2021-10-22 DIAGNOSIS — Z23 Encounter for immunization: Secondary | ICD-10-CM | POA: Diagnosis not present

## 2021-10-22 DIAGNOSIS — E1165 Type 2 diabetes mellitus with hyperglycemia: Secondary | ICD-10-CM | POA: Diagnosis not present

## 2021-10-22 LAB — POCT GLYCOSYLATED HEMOGLOBIN (HGB A1C): Hemoglobin A1C: 6.9 % — AB (ref 4.0–5.6)

## 2021-10-22 NOTE — Assessment & Plan Note (Signed)
Improved and well controlled with A1C of 6.9!  Continue metformin XR 1000 mg daily. Managed on statin and ARB. Foot exam today. Eye exam UTD. Pneumonia vaccine UTD.  Follow up in 6 months.

## 2021-10-22 NOTE — Addendum Note (Signed)
Addended by: Francella Solian on: 10/22/2021 09:09 AM   Modules accepted: Orders

## 2021-10-22 NOTE — Progress Notes (Signed)
Subjective:    Patient ID: Hannah Rocha, female    DOB: March 26, 1965, 56 y.o.   MRN: [id number]  Diabetes Pertinent negatives for hypoglycemia include no dizziness. Pertinent negatives for diabetes include no chest pain.    Hannah Rocha is a very pleasant 56 y.o. female with a history of hypertension, type 2 diabetes, hyperlipidemia, vitamin D deficiency, iron deficiency anemia who presents today for follow-up of diabetes. She is also due for her second shingrix vaccine.  Current medications include: Metformin XR 500 mg daily  She is checking his/her blood glucose 2 times daily and is getting readings of: mid 100's.   Last A1C: 7.1 in May 2023, 6.9 today Last Eye Exam: Up-to-date Last Foot Exam: Due Pneumonia Vaccination: 2019 Urine Microalbumin: None.  Managed on ARB Statin: Rosuvastatin  Dietary changes since last visit: She has been eating at home more frequently. Working on cutting back on take out food.   Exercise: Walking several days weekly   BP Readings from Last 3 Encounters:  10/22/21 138/82  09/19/21 134/60  07/24/21 (!) 162/81      Review of Systems  Eyes:  Negative for visual disturbance.  Respiratory:  Negative for shortness of breath.   Cardiovascular:  Negative for chest pain.  Neurological:  Negative for dizziness and numbness.         Past Medical History:  Diagnosis Date   Allergy    Anemia    hx   Anxiety    CARDIAC MURMUR, HX OF 05/03/2007   Annotation: ASD Qualifier: Diagnosis of  By: Council Mechanic MD, Hilaria Ota    Cataract    FLOATING CATARACT    Diabetes mellitus without complication ([place])    Dysplastic nevus 01/14/2015   right spinal mid back, severe, excised 03/04/2015   Dysplastic nevus 02/08/2018   right upper back/post shoulder, moderate   Dysplastic nevus 02/08/2018   left spinal mid upper back, severe, excised 03/21/2018   GERD (gastroesophageal reflux disease)    H/O hiatal hernia    Heart murmur    ASD  repair 1999- no meds    HIATAL HERNIA 07/09/2008   Qualifier: Diagnosis of  By: Nils Pyle CMA (AAMA), Leisha     Hx of dysplastic nevus    multiple sites, some severe   Hyperlipidemia    IBS 06/21/2009   Qualifier: Diagnosis of  By: Sharlett Iles MD Byrd Hesselbach    MENOPAUSAL SYNDROME 01/15/2010   Qualifier: Diagnosis of  By: Deborra Medina MD, Talia     Obesity    PONV (postoperative nausea and vomiting)    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   Annotation: REPAIRED AS CHILD Qualifier: Diagnosis of  By: Council Mechanic MD, Hilaria Ota     Social History   Socioeconomic History   Marital status: Married    Spouse name: Not on file   Number of children: 0   Years of education: Not on file   Highest education level: Not on file  Occupational History   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    Employer: [employer]  Tobacco Use   Smoking status: Former    Packs/day: 0.00    Years: 0.00    Total pack years: 0.00    Types: Cigarettes    Quit date: 02/21/1987    Years since quitting: 34.6   Smokeless tobacco: Never   Tobacco comments:    22 years ago-light smoker  Vaping Use   Vaping Use: Never used  Substance and Sexual Activity  Alcohol use: Yes    Alcohol/week: 0.0 standard drinks of alcohol    Comment: occasional - hard apple cider, shot   Drug use: No   Sexual activity: Yes    Partners: Male    Birth control/protection: Surgical    Comment: tubaligation/hysterctomy  Other Topics Concern   Not on file  Social History Narrative   Daily caffeine use   Social Determinants of Health   Financial Resource Strain: Not on file  Food Insecurity: Not on file  Transportation Needs: Not on file  Physical Activity: Not on file  Stress: Not on file  Social Connections: Not on file  Intimate Partner Violence: Not on file    Past Surgical History:  Procedure Laterality Date   ASD REPAIR  1999   [facility]   BREAST CYST ASPIRATION Right 2013   CLEFT LIP [place] Bilateral 04/13/2013   Procedure: LAPAROSCOPIC ASSISTED VAGINAL HYSTERECTOMY;  Surgeon: Emily Filbert, MD;  Location: [facility];  Service: Gynecology;  Laterality: Bilateral;- pt states was abdominal hysterectomy    LAPAROSCOPIC TUBAL LIGATION  03/21/2012   Procedure: LAPAROSCOPIC TUBAL LIGATION;  Surgeon: Emily Filbert, MD;  Location: [facility];  Service: Gynecology;  Laterality: Bilateral;   MANDIBLE FRACTURE SURGERY     Plantar Fascitis Repair Bilateral 01/2015    Family History  Problem Relation Age of Onset   Heart disease Mother    Colon polyps Mother    Diabetes Mother    Prostate cancer Father    Heart disease Father    Cancer Father        prostate   Diabetes Maternal Aunt    Diabetes Paternal Grandmother    Fibromyalgia Sister    Colon cancer Neg Hx    Breast cancer Neg Hx    Esophageal cancer Neg Hx    Rectal cancer Neg Hx    Stomach cancer Neg Hx     Allergies  Allergen Reactions   Sertraline Hcl     REACTION: vomiting, severe anxiety    Current Outpatient Medications on File Prior to Visit  Medication Sig Dispense Refill   acetaminophen (TYLENOL) 500 MG tablet Take 500 mg by mouth every 6 (six) hours as needed for mild pain.      B Complex Vitamins (VITAMIN B COMPLEX PO) Take 1 tablet by mouth daily.     busPIRone (BUSPAR) 30 MG tablet TAKE 1 TABLET BY MOUTH EVERY MORNING 1/2 TAB AT LUNCH, AND 1/2 TAB AT BEDTIME for anxiety. 180 tablet 2   escitalopram (LEXAPRO) 10 MG tablet Take 1 tablet (10 mg total) by mouth daily. For anxiety. 90 tablet 3   flintstones complete (FLINTSTONES) 60 MG chewable tablet Chew 1 tablet by mouth daily.     ibuprofen (ADVIL) 800 MG tablet Take 1 tablet (800 mg total) by mouth every 8 (eight) hours as needed. 60 tablet 6   losartan-hydrochlorothiazide (HYZAAR) 100-12.5 MG tablet Take 1 tablet by mouth daily. 90 tablet 3   metFORMIN (GLUCOPHAGE-XR) 500 MG 24 hr tablet Take 2 tablets (1,000  mg total) by mouth daily with breakfast. For diabetes. 180 tablet 1   pantoprazole (PROTONIX) 40 MG tablet Take 1 tablet (40 mg total) by mouth daily. 90 tablet 3   rosuvastatin (CRESTOR) 10 MG tablet Take 1 tablet (10 mg total) by mouth daily. For cholesterol. 90 tablet 3   No current facility-administered medications on file prior to visit.  BP 138/82   Pulse 67   Temp 98.6 F (37 C) (Oral)   Ht '5\' 4"'$  (1.626 m)   Wt 170 lb (77.1 kg)   LMP 03/24/2013   SpO2 100%   BMI 29.18 kg/m  Objective:   Physical Exam Cardiovascular:     Rate and Rhythm: Normal rate and regular rhythm.  Pulmonary:     Effort: Pulmonary effort is normal.     Breath sounds: Normal breath sounds.  Musculoskeletal:     Cervical back: Neck supple.  Skin:    General: Skin is warm and dry.           Assessment & Plan:   Problem List Items Addressed This Visit       Endocrine   Type 2 diabetes mellitus with hyperglycemia ([place]) - Primary    Improved and well controlled with A1C of 6.9!  Continue metformin XR 1000 mg daily. Managed on statin and ARB. Foot exam today. Eye exam UTD. Pneumonia vaccine UTD.  Follow up in 6 months.       Relevant Orders   POCT glycosylated hemoglobin (Hb A1C) (Completed)       Pleas Koch, NP

## 2021-10-22 NOTE — Patient Instructions (Signed)
Please schedule a physical to meet with me in 6 months.   Keep up the great work!  It was a pleasure to see you today!

## 2021-10-24 ENCOUNTER — Ambulatory Visit
Admission: RE | Admit: 2021-10-24 | Discharge: 2021-10-24 | Disposition: A | Discharge status: Home / Self Care | Payer: BC Managed Care – PPO | Source: Ambulatory Visit | Attending: Family Medicine | Admitting: Family Medicine

## 2021-10-24 DIAGNOSIS — R928 Other abnormal and inconclusive findings on diagnostic imaging of breast: Secondary | ICD-10-CM

## 2021-10-24 DIAGNOSIS — R922 Inconclusive mammogram: Secondary | ICD-10-CM | POA: Diagnosis not present

## 2021-11-01 ENCOUNTER — Other Ambulatory Visit: Payer: Self-pay | Admitting: Primary Care

## 2021-11-01 DIAGNOSIS — E119 Type 2 diabetes mellitus without complications: Secondary | ICD-10-CM

## 2021-11-04 ENCOUNTER — Other Ambulatory Visit: Payer: BC Managed Care – PPO

## 2021-11-04 DIAGNOSIS — E785 Hyperlipidemia, unspecified: Secondary | ICD-10-CM | POA: Diagnosis not present

## 2021-11-04 LAB — HEPATIC FUNCTION PANEL
ALT: 16 IU/L (ref 0–32)
AST: 16 IU/L (ref 0–40)
Albumin: 4.5 g/dL (ref 3.8–4.9)
Alkaline Phosphatase: 102 IU/L (ref 44–121)
Bilirubin Total: 0.3 mg/dL (ref 0.0–1.2)
Bilirubin, Direct: <0.1 mg/dL (ref 0.00–0.40)
Total Protein: 7.2 g/dL (ref 6.0–8.5)

## 2021-11-04 LAB — LIPID PANEL
Chol/HDL Ratio: 3.4 ratio (ref 0.0–4.4)
Cholesterol, Total: 142 mg/dL (ref 100–199)
HDL: 42 mg/dL (ref >39)
LDL Chol Calc (NIH): 83 mg/dL (ref 0–99)
Triglycerides: 90 mg/dL (ref 0–149)
VLDL Cholesterol Cal: 17 mg/dL (ref 5–40)

## 2021-11-06 ENCOUNTER — Other Ambulatory Visit: Payer: Self-pay | Admitting: *Deleted

## 2021-11-06 DIAGNOSIS — E785 Hyperlipidemia, unspecified: Secondary | ICD-10-CM

## 2021-11-06 MED ORDER — ROSUVASTATIN CALCIUM 20 MG PO TABS: 20.0000 mg | ORAL_TABLET | Freq: Every day | ORAL | 3 refills | Status: DC

## 2021-11-21 DIAGNOSIS — H259 Unspecified age-related cataract: Secondary | ICD-10-CM | POA: Diagnosis not present

## 2021-12-02 DIAGNOSIS — H25013 Cortical age-related cataract, bilateral: Secondary | ICD-10-CM | POA: Diagnosis not present

## 2021-12-02 LAB — HM DIABETES EYE EXAM

## 2021-12-18 DIAGNOSIS — H2511 Age-related nuclear cataract, right eye: Secondary | ICD-10-CM | POA: Diagnosis not present

## 2021-12-18 DIAGNOSIS — H25011 Cortical age-related cataract, right eye: Secondary | ICD-10-CM | POA: Diagnosis not present

## 2021-12-18 DIAGNOSIS — H269 Unspecified cataract: Secondary | ICD-10-CM | POA: Diagnosis not present

## 2021-12-21 HISTORY — PX: CATARACT EXTRACTION: SUR2

## 2021-12-27 ENCOUNTER — Other Ambulatory Visit: Payer: Self-pay | Admitting: Primary Care

## 2021-12-27 DIAGNOSIS — F411 Generalized anxiety disorder: Secondary | ICD-10-CM

## 2022-01-08 DIAGNOSIS — H25012 Cortical age-related cataract, left eye: Secondary | ICD-10-CM | POA: Diagnosis not present

## 2022-01-08 DIAGNOSIS — H269 Unspecified cataract: Secondary | ICD-10-CM | POA: Diagnosis not present

## 2022-01-13 ENCOUNTER — Ambulatory Visit: Payer: BC Managed Care – PPO | Admitting: Gastroenterology

## 2022-01-13 ENCOUNTER — Encounter: Payer: Self-pay | Admitting: Gastroenterology

## 2022-01-13 VITALS — BP 126/74 | HR 76 | Ht 64.0 in | Wt 173.4 lb

## 2022-01-13 DIAGNOSIS — K219 Gastro-esophageal reflux disease without esophagitis: Secondary | ICD-10-CM | POA: Diagnosis not present

## 2022-01-13 DIAGNOSIS — Z86010 Personal history of colonic polyps: Secondary | ICD-10-CM | POA: Diagnosis not present

## 2022-01-13 MED ORDER — PANTOPRAZOLE SODIUM 40 MG PO TBEC: 40.0000 mg | DELAYED_RELEASE_TABLET | Freq: Every day | ORAL | 3 refills | Status: DC

## 2022-01-13 NOTE — Progress Notes (Signed)
    Assessment     GERD Personal history of adenomatous colon polyps Family history of colon polyps, first-degree relative   Recommendations    Continue pantoprazole 40 mg daily, follow antireflux measures and REV in 1 year Surveillance colonoscopy in October 2024    HPI    This is a 56 year old female returning for follow-up of GERD.  Symptoms are very well controlled on daily pantoprazole.  She has no gastrointestinal complaints.  EGD in 2011 by Dr. Sharlett Iles showed a small HH and benign fundic gland polyps.    Labs / Imaging       Latest Ref Rng & Units 11/04/2021    7:34 AM 09/19/2021    3:32 PM 04/23/2021    8:40 AM  Hepatic Function  Total Protein 6.0 - 8.5 g/dL 7.2  6.5  7.6   Albumin 3.8 - 4.9 g/dL 4.5   4.5   AST 0 - 40 IU/L $Remov'16  15  15   'YLLipa$ ALT 0 - 32 IU/L $Remov'16  21  21   'YspPql$ Alk Phosphatase 44 - 121 IU/L 102   103   Total Bilirubin 0.0 - 1.2 mg/dL 0.3  0.3  0.4   Bilirubin, Direct 0.00 - 0.40 mg/dL <0.10          Latest Ref Rng & Units 09/19/2021    3:32 PM 04/23/2021    8:40 AM 04/16/2020    8:50 AM  CBC  WBC 3.8 - 10.8 Thousand/uL 9.3  5.1  5.8   Hemoglobin 11.7 - 15.5 g/dL 11.1  12.7  12.8   Hematocrit 35.0 - 45.0 % 33.3  38.0  38.2   Platelets 140 - 400 Thousand/uL 169  205.0  198.0     Current Medications, Allergies, Past Medical History, Past Surgical History, Family History and Social History were reviewed in [employer] record.   Physical Exam: General: Well developed, well nourished, no acute distress Head: Normocephalic and atraumatic Eyes: Sclerae anicteric, EOMI Ears: Normal auditory acuity Mouth: Not examined Lungs: Clear throughout to auscultation Heart: Regular rate and rhythm; no murmurs, rubs or bruits Abdomen: Soft, non tender and non distended. No masses, hepatosplenomegaly or hernias noted. Normal Bowel sounds Rectal: Not done Musculoskeletal: Symmetrical with no gross deformities  Pulses:  Normal pulses  noted Extremities: No clubbing, cyanosis, edema or deformities noted Neurological: Alert oriented x 4, grossly nonfocal Psychological:  Alert and cooperative. Normal mood and affect   Candler Ginsberg T. Fuller Plan, MD 01/13/2022, 8:34 AM

## 2022-01-13 NOTE — Patient Instructions (Signed)
We have sent the following medications to your pharmacy for you to pick up at your convenience: pantoprazole.   The [facility] providers would like to encourage you to use MYCHART to communicate with providers for non-urgent requests or questions.  Due to long hold times on the telephone, sending your provider a message by MYCHART may be a faster and more efficient way to get a response.  Please allow 48 business hours for a response.  Please remember that this is for non-urgent requests.   Thank you for choosing me and [facility].  Malcolm T. Stark, Jr., MD., FACG   

## 2022-01-23 ENCOUNTER — Other Ambulatory Visit: Payer: Self-pay | Admitting: Primary Care

## 2022-01-23 DIAGNOSIS — F411 Generalized anxiety disorder: Secondary | ICD-10-CM

## 2022-02-06 ENCOUNTER — Ambulatory Visit: Payer: BC Managed Care – PPO | Attending: Cardiovascular Disease

## 2022-02-06 DIAGNOSIS — E785 Hyperlipidemia, unspecified: Secondary | ICD-10-CM

## 2022-02-07 LAB — LIPID PANEL
Chol/HDL Ratio: 3 ratio (ref 0.0–4.4)
Cholesterol, Total: 132 mg/dL (ref 100–199)
HDL: 44 mg/dL (ref >39)
LDL Chol Calc (NIH): 72 mg/dL (ref 0–99)
Triglycerides: 79 mg/dL (ref 0–149)
VLDL Cholesterol Cal: 16 mg/dL (ref 5–40)

## 2022-02-07 LAB — HEPATIC FUNCTION PANEL
ALT: 20 IU/L (ref 0–32)
AST: 18 IU/L (ref 0–40)
Albumin: 4.7 g/dL (ref 3.8–4.9)
Alkaline Phosphatase: 97 IU/L (ref 44–121)
Bilirubin Total: 0.3 mg/dL (ref 0.0–1.2)
Bilirubin, Direct: 0.11 mg/dL (ref 0.00–0.40)
Total Protein: 7 g/dL (ref 6.0–8.5)

## 2022-03-02 ENCOUNTER — Other Ambulatory Visit: Payer: Self-pay | Admitting: Gastroenterology

## 2022-03-03 ENCOUNTER — Other Ambulatory Visit: Payer: Self-pay | Admitting: *Deleted

## 2022-03-03 ENCOUNTER — Encounter: Payer: Self-pay | Admitting: Family Medicine

## 2022-03-03 MED ORDER — IBUPROFEN 800 MG PO TABS: 800.0000 mg | ORAL_TABLET | Freq: Three times a day (TID) | ORAL | 3 refills | Status: DC | PRN

## 2022-03-20 ENCOUNTER — Other Ambulatory Visit: Payer: Self-pay | Admitting: Primary Care

## 2022-03-20 DIAGNOSIS — F411 Generalized anxiety disorder: Secondary | ICD-10-CM

## 2022-04-03 ENCOUNTER — Encounter: Payer: Self-pay | Admitting: Primary Care

## 2022-04-03 ENCOUNTER — Ambulatory Visit (INDEPENDENT_AMBULATORY_CARE_PROVIDER_SITE_OTHER): Payer: BC Managed Care – PPO | Admitting: Primary Care

## 2022-04-03 VITALS — BP 150/84 | HR 67 | Temp 97.5°F | Ht 64.0 in | Wt 174.0 lb

## 2022-04-03 DIAGNOSIS — R102 Pelvic and perineal pain: Secondary | ICD-10-CM

## 2022-04-03 LAB — POC URINALSYSI DIPSTICK (AUTOMATED)
Bilirubin, UA: NEGATIVE
Blood, UA: NEGATIVE
Glucose, UA: NEGATIVE
Ketones, UA: NEGATIVE
Nitrite, UA: NEGATIVE
Protein, UA: POSITIVE — AB
Spec Grav, UA: 1.015 (ref 1.010–1.025)
Urobilinogen, UA: 0.2 E.U./dL
pH, UA: 7 (ref 5.0–8.0)

## 2022-04-03 MED ORDER — SULFAMETHOXAZOLE-TRIMETHOPRIM 800-160 MG PO TABS: 1.0000 | ORAL_TABLET | Freq: Two times a day (BID) | ORAL | 0 refills | Status: DC

## 2022-04-03 NOTE — Progress Notes (Signed)
Subjective:    Patient ID: Hannah Rocha, female    DOB: December 01, 1965, 57 y.o.   MRN: [id number]  Abdominal Pain Pertinent negatives include no constipation, diarrhea, dysuria, fever, frequency, hematuria, nausea or vomiting.    Hannah Rocha is a very pleasant 57 y.o. female  has a past medical history of Allergy, Anemia, Anxiety, CARDIAC MURMUR, HX OF (05/03/2007), Cataract, Diabetes mellitus without complication ([place]), Dysplastic nevus (01/14/2015), Dysplastic nevus (02/08/2018), Dysplastic nevus (02/08/2018), GERD (gastroesophageal reflux disease), H/O hiatal hernia, Heart murmur, HIATAL HERNIA (07/09/2008), dysplastic nevus, Hyperlipidemia, IBS (06/21/2009), MENOPAUSAL SYNDROME (01/15/2010), Obesity, PONV (postoperative nausea and vomiting), and 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 (05/03/2007). who presents today to discuss abdominal pain.  Symptom onset 3-4 days ago with mid and bilateral lower back pain. She then developed bilateral lower abdominal/suprapubic pain for which she describes as "sore" and intermittent. She's also felt fatigued. She came home from work yesterday and had to nap.   Her appetite is good. Is eating and drinking well.   She denies dysuria, urinary frequency, hematuria, fevers, chills, diarrhea, constipation, nausea, vomiting, vaginal discharge. Her pain dose not radiate around to her groin.     History of cystitis, last one was in June 2023, culture positive with Streptococcus agalactiae. She has never had renal stones.   Review of Systems  Constitutional:  Positive for fatigue. Negative for appetite change, chills and fever.  Gastrointestinal:  Positive for abdominal pain. Negative for constipation, diarrhea, nausea and vomiting.  Genitourinary:  Positive for pelvic pain. Negative for dysuria, frequency, hematuria and vaginal discharge.         Past Medical History:  Diagnosis Date   Allergy    Anemia    hx   Anxiety     CARDIAC MURMUR, HX OF 05/03/2007   Annotation: ASD Qualifier: Diagnosis of  By: Council Mechanic MD, Hilaria Ota    Cataract    FLOATING CATARACT    Diabetes mellitus without complication ([employer])    Dysplastic nevus 01/14/2015   right spinal mid back, severe, excised 03/04/2015   Dysplastic nevus 02/08/2018   right upper back/post shoulder, moderate   Dysplastic nevus 02/08/2018   left spinal mid upper back, severe, excised 03/21/2018   GERD (gastroesophageal reflux disease)    H/O hiatal hernia    Heart murmur    ASD repair 1999- no meds    HIATAL HERNIA 07/09/2008   Qualifier: Diagnosis of  By: Nils Pyle CMA (AAMA), Leisha     Hx of dysplastic nevus    multiple sites, some severe   Hyperlipidemia    IBS 06/21/2009   Qualifier: Diagnosis of  By: Sharlett Iles MD Byrd Hesselbach    MENOPAUSAL SYNDROME 01/15/2010   Qualifier: Diagnosis of  By: Deborra Medina MD, Talia     Obesity    PONV (postoperative nausea and vomiting)    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   Annotation: REPAIRED AS CHILD Qualifier: Diagnosis of  By: Council Mechanic MD, Hilaria Ota     Social History   Socioeconomic History   Marital status: Married    Spouse name: Not on file   Number of children: 0   Years of education: Not on file   Highest education level: Not on file  Occupational History   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    Employer: [employer]  Tobacco Use   Smoking status: Former    Packs/day: 0.00    Years: 0.00    Total pack years: 0.00  Types: Cigarettes    Quit date: 02/21/1987    Years since quitting: 35.1   Smokeless tobacco: Never   Tobacco comments:    22 years ago-light smoker  Vaping Use   Vaping Use: Never used  Substance and Sexual Activity   Alcohol use: Yes    Alcohol/week: 0.0 standard drinks of alcohol    Comment: occasional - hard apple cider, shot   Drug use: No   Sexual activity: Yes    Partners: Male    Birth control/protection: Surgical    Comment:  tubaligation/hysterctomy  Other Topics Concern   Not on file  Social History Narrative   Daily caffeine use   Social Determinants of Health   Financial Resource Strain: Not on file  Food Insecurity: Not on file  Transportation Needs: Not on file  Physical Activity: Not on file  Stress: Not on file  Social Connections: Not on file  Intimate Partner Violence: Not on file    Past Surgical History:  Procedure Laterality Date   ASD REPAIR  1999   [facility]   BREAST CYST ASPIRATION Right 2013   CATARACT EXTRACTION Bilateral    Right done September 2023 and Left January 08 2022   CATARACT EXTRACTION Bilateral 12/21/2021   CLEFT LIP REPAIR     COLONOSCOPY     LAPAROSCOPIC ASSISTED VAGINAL HYSTERECTOMY Bilateral 04/13/2013   Procedure: LAPAROSCOPIC ASSISTED VAGINAL HYSTERECTOMY;  Surgeon: Emily Filbert, MD;  Location: [facility];  Service: Gynecology;  Laterality: Bilateral;- pt states was abdominal hysterectomy    LAPAROSCOPIC TUBAL LIGATION  03/21/2012   Procedure: LAPAROSCOPIC TUBAL LIGATION;  Surgeon: Emily Filbert, MD;  Location: [facility];  Service: Gynecology;  Laterality: Bilateral;   MANDIBLE FRACTURE SURGERY     Plantar Fascitis Repair Bilateral 01/2015    Family History  Problem Relation Age of Onset   Heart disease Mother    Colon polyps Mother    Diabetes Mother    Prostate cancer Father    Heart disease Father    Cancer Father        prostate   Diabetes Maternal Aunt    Diabetes Paternal Grandmother    Fibromyalgia Sister    Colon cancer Neg Hx    Breast cancer Neg Hx    Esophageal cancer Neg Hx    Rectal cancer Neg Hx    Stomach cancer Neg Hx     Allergies  Allergen Reactions   Sertraline Hcl     REACTION: vomiting, severe anxiety    Current Outpatient Medications on File Prior to Visit  Medication Sig Dispense Refill   acetaminophen (TYLENOL) 500 MG tablet Take 500 mg by mouth every 6 (six) hours as needed for mild pain.      B Complex Vitamins (VITAMIN B COMPLEX  PO) Take 1 tablet by mouth daily.     busPIRone (BUSPAR) 30 MG tablet TAKE 1 TABLET BY MOUTH EVERY MORNING 1/2 TAB AT LUNCH, AND 1/2 TAB AT BEDTIME FOR ANXIETY. 180 tablet 0   escitalopram (LEXAPRO) 10 MG tablet Take 1 tablet (10 mg total) by mouth daily. For anxiety. 90 tablet 3   flintstones complete (FLINTSTONES) 60 MG chewable tablet Chew 1 tablet by mouth daily.     ibuprofen (ADVIL) 800 MG tablet Take 1 tablet (800 mg total) by mouth every 8 (eight) hours as needed. 60 tablet 3   losartan-hydrochlorothiazide (HYZAAR) 100-12.5 MG tablet Take 1 tablet by mouth daily. 90 tablet 3   metFORMIN (GLUCOPHAGE-XR) 500 MG 24 hr  tablet TAKE 2 TABLETS (1,000 MG TOTAL) BY MOUTH DAILY WITH BREAKFAST. FOR DIABETES. 180 tablet 1   pantoprazole (PROTONIX) 40 MG tablet TAKE 1 TABLET BY MOUTH EVERY DAY 90 tablet 3   rosuvastatin (CRESTOR) 20 MG tablet Take 1 tablet (20 mg total) by mouth daily. For cholesterol. 90 tablet 3   ketorolac (ACULAR) 0.5 % ophthalmic solution 1 drop. 3 times a day for a week 2 times a day for a week  1 times for 14 days (Patient not taking: Reported on 04/03/2022)     ofloxacin (OCUFLOX) 0.3 % ophthalmic solution 3 times a day for a week 2 times a day for a week  1 times for 14 days (Patient not taking: Reported on 04/03/2022)     No current facility-administered medications on file prior to visit.    BP (!) 150/84   Pulse 67   Temp (!) 97.5 F (36.4 C) (Temporal)   Ht '5\' 4"'$  (1.626 m)   Wt 174 lb (78.9 kg)   LMP 03/24/2013   SpO2 99%   BMI 29.87 kg/m  Objective:   Physical Exam Constitutional:      General: She is not in acute distress. Cardiovascular:     Rate and Rhythm: Normal rate and regular rhythm.  Pulmonary:     Effort: Pulmonary effort is normal.     Breath sounds: Normal breath sounds.  Abdominal:     Tenderness: There is abdominal tenderness in the suprapubic area and left upper quadrant. There is right CVA tenderness.    Musculoskeletal:     Cervical  back: Neck supple.  Skin:    General: Skin is warm and dry.           Assessment & Plan:  Suprapubic pain Assessment & Plan: Symptoms representative of UTI, need to consider pyelonephritis.  No evidence of diverticula on colonoscopy from 2019.  UA today with 3+ leuks, otherwise negative. Given symptoms, coupled with UA results and prior UTI history, will treat for presumed infection. Culture ordered and pending.  Start Bactrim DS (sulfamethoxazole/trimethoprim) tablets for urinary tract infection. Take 1 tablet by mouth twice daily for 3 days.    Orders: -     POCT Urinalysis Dipstick (Automated) -     Sulfamethoxazole-Trimethoprim; Take 1 tablet by mouth 2 (two) times daily.  Dispense: 6 tablet; Refill: 0 -     Urine Culture        Pleas Koch, NP

## 2022-04-03 NOTE — Assessment & Plan Note (Signed)
Symptoms representative of UTI, need to consider pyelonephritis.  No evidence of diverticula on colonoscopy from 2019.  UA today with 3+ leuks, otherwise negative. Given symptoms, coupled with UA results and prior UTI history, will treat for presumed infection. Culture ordered and pending.  Start Bactrim DS (sulfamethoxazole/trimethoprim) tablets for urinary tract infection. Take 1 tablet by mouth twice daily for 3 days.

## 2022-04-03 NOTE — Patient Instructions (Signed)
Start Bactrim DS (sulfamethoxazole/trimethoprim) tablets for urinary tract infection. Take 1 tablet by mouth twice daily for 3 days.  Increase intake of water.  It was a pleasure to see you today!

## 2022-04-04 LAB — URINE CULTURE
MICRO NUMBER:: 14424876
SPECIMEN QUALITY:: ADEQUATE

## 2022-04-14 ENCOUNTER — Encounter: Payer: Self-pay | Admitting: Dermatology

## 2022-04-14 ENCOUNTER — Ambulatory Visit: Payer: BC Managed Care – PPO | Admitting: Dermatology

## 2022-04-14 VITALS — BP 128/72 | HR 71

## 2022-04-14 DIAGNOSIS — L821 Other seborrheic keratosis: Secondary | ICD-10-CM

## 2022-04-14 DIAGNOSIS — D2262 Melanocytic nevi of left upper limb, including shoulder: Secondary | ICD-10-CM

## 2022-04-14 DIAGNOSIS — L988 Other specified disorders of the skin and subcutaneous tissue: Secondary | ICD-10-CM

## 2022-04-14 DIAGNOSIS — D2239 Melanocytic nevi of other parts of face: Secondary | ICD-10-CM

## 2022-04-14 DIAGNOSIS — D224 Melanocytic nevi of scalp and neck: Secondary | ICD-10-CM

## 2022-04-14 DIAGNOSIS — D2272 Melanocytic nevi of left lower limb, including hip: Secondary | ICD-10-CM

## 2022-04-14 DIAGNOSIS — Z1283 Encounter for screening for malignant neoplasm of skin: Secondary | ICD-10-CM

## 2022-04-14 DIAGNOSIS — B354 Tinea corporis: Secondary | ICD-10-CM

## 2022-04-14 DIAGNOSIS — D225 Melanocytic nevi of trunk: Secondary | ICD-10-CM

## 2022-04-14 DIAGNOSIS — L820 Inflamed seborrheic keratosis: Secondary | ICD-10-CM

## 2022-04-14 DIAGNOSIS — Z86018 Personal history of other benign neoplasm: Secondary | ICD-10-CM

## 2022-04-14 DIAGNOSIS — D229 Melanocytic nevi, unspecified: Secondary | ICD-10-CM

## 2022-04-14 MED ORDER — CICLOPIROX OLAMINE 0.77 % EX CREA: TOPICAL_CREAM | Freq: Two times a day (BID) | CUTANEOUS | 0 refills | Status: DC

## 2022-04-14 NOTE — Progress Notes (Signed)
Follow-Up Visit   Subjective  Hannah Rocha is a 57 y.o. female who presents for the following: Annual Exam (1 year tbse, hx of dysplastic nevi . Spot at left lower leg that she would like checked. Skin tag at lower legs. ).  She thinks she has ringworm on her leg.  She is around cats and has had it before.  Tag on leg is irritated.  She has multiple irritated moles that she would like removed.   The following portions of the chart were reviewed this encounter and updated as appropriate:      Review of Systems: No other skin or systemic complaints except as noted in HPI or Assessment and Plan.   Objective  Well appearing patient in no apparent distress; mood and affect are within normal limits.  A full examination was performed including scalp, head, eyes, ears, nose, lips, neck, chest, axillae, abdomen, back, buttocks, bilateral upper extremities, bilateral lower extremities, hands, feet, fingers, toes, fingernails, and toenails. All findings within normal limits unless otherwise noted below.  right posterior lower hip x 1 Erythematous stuck-on, waxy papule  face Rhytides and volume loss.   left lateral thigh Round scaly pink patch   left abdomen 0.7 x 1.8 cm pink brown plaque appears as two adjacent   left ankle 5  mm medium brown macule   left mid buttock 9 mm speckled brown macule with superior 2 mm brown macule  left lateral buttock 8 x 5 mm brown macule appears as two adjacent   left hip 3 mm medium dark brown macule   right spinal mid upper back 4 mm pink papule with adjacent 4 mm tan macule   left jaw, neck, right chin, left shoulder and right sternum Multiple flesh papules   Assessment & Plan  Inflamed seborrheic keratosis right posterior lower hip x 1  Symptomatic, irritating, patient would like treated.  Destruction of lesion - right posterior lower hip x 1  Destruction method: cryotherapy   Informed consent: discussed and consent  obtained   Lesion destroyed using liquid nitrogen: Yes   Region frozen until ice ball extended beyond lesion: Yes   Outcome: patient tolerated procedure well with no complications   Post-procedure details: wound care instructions given   Additional details:  Prior to procedure, discussed risks of blister formation, small wound, skin dyspigmentation, or rare scar following cryotherapy. Recommend Vaseline ointment to treated areas while healing.   Elastosis of skin face  Discussed Basic OTC daily skin care regimen to prevent photoaging:   Recommend facial moisturizer with sunscreen SPF 30 every morning (OTC brands include CeraVe AM, Neutrogena, Eucerin, Cetaphil, Aveeno, La Roche Posay).  Can also apply a topical Vit C serum which is an antioxidant (OTC brands include CeraVe, La Roche Posay, and The Ordinary) underneath sunscreen in morning. If you are outside during the day in the summer for extended periods, especially swimming and/or sweating, make sure you apply a water resistant facial sunscreen lotion spf 30 or higher.   At night recommend a cream with retinol (a vitamin A derivative which stimulates collagen production) like CeraVe skin renewing retinol serum or ROC retinol correxion cream or Neutrogena rapid wrinkle repair cream. Retinol may cause skin irritation in people with sensitive skin.  Can use it every other day and/or apply on top of a hyaluronic acid (HA) moisturizer/serum (Neutrogena Hydroboost water cream) if better tolerated that way.  Retinol may also help with lightening brown spots.   Our office sells high quality,  medically tested skin care lines such as Elta MD sunscreens (with Zinc), and Alastin skin care products, which are very effective in treating photoaging. The Alastin line includes cosmeceutical grade Vit.C serum, HA serum, Elastin stimulating moisturizers/serums, lightening serum, and sunscreens.  If you want prescription treatment, then you would need an  appointment (Rx tretinoin and fade creams, Botox, filler injections, laser treatments, etc.) These prescriptions and procedures are not covered by insurance but work very well.   Tinea corporis left lateral thigh  Vs nummular dermatitis  Recommend starting Ciclopirox cream - apply topical to aa bid for 2 weeks.  If not resolved/improved instructed to call back and will send in topical steroid  ciclopirox (LOPROX) 0.77 % cream - left lateral thigh Apply topically 2 (two) times daily. Apply to affected area of left thigh for 2 weeks.  Nevus (6) left hip; left lateral buttock; left mid buttock; left ankle; left abdomen; right spinal mid upper back  Multiple nevi left jaw, neck, right chin, left shoulder and right sternum  Benign-appearing.  Observation.  Call clinic for new or changing moles.  Recommend daily use of broad spectrum spf 30+ sunscreen to sun-exposed areas.    Patient bothered by and would like removed  Discussed shave removal to locations at left jaw, neck, right chin, left shoulder and right sternum. Will need 30 min appointment.  Will likely require more than one visit due to number of moles.  Discussed resulting small scar with shave removal, and possible recurrence of lesion.  Recommend vaseline ointment to area daily and cover until healed.  Recommend photoprotection/sunscreen to area to prevent discoloration of scar.  Once healed, may apply OTC Serica scar gel bid to thickened scars.      Lentigines - Scattered tan macules - Due to sun exposure - Benign-appearing, observe - Recommend daily broad spectrum sunscreen SPF 30+ to sun-exposed areas, reapply every 2 hours as needed. - Call for any changes  Seborrheic Keratoses - Stuck-on, waxy, tan-brown papules and/or plaques  - Benign-appearing - Discussed benign etiology and prognosis. - Observe - Call for any changes  Melanocytic Nevi - Tan-brown and/or pink-flesh-colored symmetric macules and papules -  Benign appearing on exam today - Observation - Call clinic for new or changing moles - Recommend daily use of broad spectrum spf 30+ sunscreen to sun-exposed areas.   Hemangiomas - Red papules - Discussed benign nature - Observe - Call for any changes  Actinic Damage - Chronic condition, secondary to cumulative UV/sun exposure - diffuse scaly erythematous macules with underlying dyspigmentation - Recommend daily broad spectrum sunscreen SPF 30+ to sun-exposed areas, reapply every 2 hours as needed.  - Staying in the shade or wearing long sleeves, sun glasses (UVA+UVB protection) and wide brim hats (4-inch brim around the entire circumference of the hat) are also recommended for sun protection.  - Call for new or changing lesions.  History of Dysplastic Nevi Multiple locations see history  - No evidence of recurrence today - Recommend regular full body skin exams - Recommend daily broad spectrum sunscreen SPF 30+ to sun-exposed areas, reapply every 2 hours as needed.  - Call if any new or changing lesions are noted between office visits  Skin cancer screening performed today. Return in about 1 year (around 04/15/2023) for TBSE.  I, Ruthell Rummage, CMA, am acting as scribe for Brendolyn Patty, MD.  Documentation: I have reviewed the above documentation for accuracy and completeness, and I agree with the above.  Brendolyn Patty MD

## 2022-04-14 NOTE — Patient Instructions (Addendum)
Basic OTC daily skin care regimen to prevent photoaging:   Recommend facial moisturizer with sunscreen SPF 30 every morning (OTC brands include CeraVe AM, Neutrogena, Eucerin, Cetaphil, Aveeno, La Roche Posay).  Can also apply a topical Vit C serum which is an antioxidant (OTC brands include CeraVe, La Roche Posay, and The Ordinary) underneath sunscreen in morning. If you are outside during the day in the summer for extended periods, especially swimming and/or sweating, make sure you apply a water resistant facial sunscreen lotion spf 30 or higher.   At night recommend a cream with retinol (a vitamin A derivative which stimulates collagen production) like CeraVe skin renewing retinol serum or ROC retinol correxion cream or Neutrogena rapid wrinkle repair cream. Retinol may cause skin irritation in people with sensitive skin.  Can use it every other day and/or apply on top of a hyaluronic acid (HA) moisturizer/serum (Neutrogena Hydroboost water cream) if better tolerated that way.  Retinol may also help with lightening brown spots.   Our office sells high quality, medically tested skin care lines such as Elta MD sunscreens (with Zinc), and Alastin skin care products, which are very effective in treating photoaging. The Alastin line includes cosmeceutical grade Vit.C serum, HA serum, Elastin stimulating moisturizers/serums, lightening serum, and sunscreens.  If you want prescription treatment, then you would need an appointment (Rx tretinoin and fade creams, Botox, filler injections, laser treatments, etc.) These prescriptions and procedures are not covered by insurance but work very well.     Melanoma ABCDEs  Melanoma is the most dangerous type of skin cancer, and is the leading cause of death from skin disease.  You are more likely to develop melanoma if you: Have light-colored skin, light-colored eyes, or red or blond hair Spend a lot of time in the sun Tan regularly, either outdoors or in a  tanning bed Have had blistering sunburns, especially during childhood Have a close family member who has had a melanoma Have atypical moles or large birthmarks  Early detection of melanoma is key since treatment is typically straightforward and cure rates are extremely high if we catch it early.   The first sign of melanoma is often a change in a mole or a new dark spot.  The ABCDE system is a way of remembering the signs of melanoma.  A for asymmetry:  The two halves do not match. B for border:  The edges of the growth are irregular. C for color:  A mixture of colors are present instead of an even brown color. D for diameter:  Melanomas are usually (but not always) greater than 61m - the size of a pencil eraser. E for evolution:  The spot keeps changing in size, shape, and color.  Please check your skin once per month between visits. You can use a small mirror in front and a large mirror behind you to keep an eye on the back side or your body.   If you see any new or changing lesions before your next follow-up, please call to schedule a visit.  Please continue daily skin protection including broad spectrum sunscreen SPF 30+ to sun-exposed areas, reapplying every 2 hours as needed when you're outdoors.   Staying in the shade or wearing long sleeves, sun glasses (UVA+UVB protection) and wide brim hats (4-inch brim around the entire circumference of the hat) are also recommended for sun protection.     Due to recent changes in healthcare laws, you may see results of your pathology and/or laboratory studies on  MyChart before the doctors have had a chance to review them. We understand that in some cases there may be results that are confusing or concerning to you. Please understand that not all results are received at the same time and often the doctors may need to interpret multiple results in order to provide you with the best plan of care or course of treatment. Therefore, we ask that you  please give [place] 2 business days to thoroughly review all your results before contacting the office for clarification. Should we see a critical lab result, you will be contacted sooner.   If You Need Anything After Your Visit  If you have any questions or concerns for your doctor, please call our main line at [phone] and press option 4 to reach your doctor's medical assistant. If no one answers, please leave a voicemail as directed and we will return your call as soon as possible. Messages left after 4 pm will be answered the following business day.   You may also send [place] a message via [place]. We typically respond to MyChart messages within 1-2 business days.  For prescription refills, please ask your pharmacy to contact our office. Our fax number is [phone].  If you have an urgent issue when the clinic is closed that cannot wait until the next business day, you can page your doctor at the number below.    Please note that while we do our best to be available for urgent issues outside of office hours, we are not available 24/7.   If you have an urgent issue and are unable to reach [place], you may choose to seek medical care at your doctor's office, retail clinic, urgent care center, or emergency room.  If you have a medical emergency, please immediately call 911 or go to the emergency department.  Pager Numbers  - Dr. Nehemiah Massed: [phone]  - Dr. Laurence Ferrari: [phone]  - Dr. Nicole Kindred: [phone]  In the event of inclement weather, please call our main line at [phone] for an update on the status of any delays or closures.  Dermatology Medication Tips: Please keep the boxes that topical medications come in in order to help keep track of the instructions about where and how to use these. Pharmacies typically print the medication instructions only on the boxes and not directly on the medication tubes.   If your medication is too expensive, please contact our office at  [phone] option 4 or send [place] a message through [place].   We are unable to tell what your co-pay for medications will be in advance as this is different depending on your insurance coverage. However, we may be able to find a substitute medication at lower cost or fill out paperwork to get insurance to cover a needed medication.   If a prior authorization is required to get your medication covered by your insurance company, please allow [place] 1-2 business days to complete this process.  Drug prices often vary depending on where the prescription is filled and some pharmacies may offer cheaper prices.  The website www.goodrx.com contains coupons for medications through different pharmacies. The prices here do not account for what the cost may be with help from insurance (it may be cheaper with your insurance), but the website can give you the price if you did not use any insurance.  - You can print the associated coupon and take it with your prescription to the pharmacy.  - You may also stop by our office during regular business  hours and pick up a GoodRx coupon card.  - If you need your prescription sent electronically to a different pharmacy, notify our office through [facility] or by phone at [phone] option 4.     Si Usted Necesita Algo Despus de Su Visita  Tambin puede enviarnos un mensaje a travs de Pharmacist, community. Por lo general respondemos a los mensajes de MyChart en el transcurso de 1 a 2 das hbiles.  Para renovar recetas, por favor pida a su farmacia que se ponga en contacto con nuestra oficina. Harland Dingwall de fax es [place] [phone].  Si tiene un asunto urgente cuando la clnica est cerrada y que no puede esperar hasta el siguiente da hbil, puede llamar/localizar a su doctor(a) al nmero que aparece a continuacin.   Por favor, tenga en cuenta que aunque hacemos todo lo posible para estar disponibles para asuntos urgentes fuera del horario de [place], no estamos  disponibles las 24 horas del da, los 7 das de la [place].   Si tiene un problema urgente y no puede comunicarse con nosotros, puede optar por buscar atencin mdica  en el consultorio de su doctor(a), en una clnica privada, en un centro de atencin urgente o en una sala de emergencias.  Si tiene Engineering geologist, por favor llame inmediatamente al 911 o vaya a la sala de emergencias.  Nmeros de bper  - Dr. Nehemiah Massed: [phone]  - Dra. Moye: [phone]  - Dra. Nicole Kindred: [phone]  En caso de inclemencias del [place], por favor llame a Johnsie Kindred principal al [phone] para una actualizacin sobre el [place] de cualquier retraso o cierre.  Consejos para la medicacin en dermatologa: Por favor, guarde las cajas en las que vienen los medicamentos de uso tpico para ayudarle a seguir las instrucciones sobre dnde y cmo usarlos. Las farmacias generalmente imprimen las instrucciones del medicamento slo en las cajas y no directamente en los tubos del [place].   Si su medicamento es muy caro, por favor, pngase en contacto con Zigmund Daniel llamando al [phone] y presione la opcin 4 o envenos un mensaje a travs de Pharmacist, community.   No podemos decirle cul ser su copago por los medicamentos por adelantado ya que esto es diferente dependiendo de la cobertura de su seguro. Sin embargo, es posible que podamos encontrar un medicamento sustituto a Electrical engineer un formulario para que el seguro cubra el medicamento que se considera necesario.   Si se requiere una autorizacin previa para que su compaa de seguros [place] su medicamento, por favor permtanos de 1 a 2 das hbiles para completar este proceso.  Los precios de los medicamentos varan con frecuencia dependiendo del Environmental consultant de dnde se surte la receta y alguna farmacias pueden ofrecer precios ms baratos.  El sitio web www.goodrx.com tiene cupones para medicamentos de Airline pilot. Los precios aqu no  tienen en cuenta lo que podra costar con la ayuda del seguro (puede ser ms barato con su seguro), pero el sitio web puede darle el precio si no utiliz Research scientist (physical sciences).  - Puede imprimir el cupn correspondiente y llevarlo con su receta a la farmacia.  - Tambin puede pasar por nuestra oficina durante el horario de atencin regular y Charity fundraiser una tarjeta de cupones de GoodRx.  - Si necesita que su receta se enve electrnicamente a una farmacia diferente, informe a nuestra oficina a travs de MyChart de [place] o por telfono llamando al [phone] y presione la opcin 4.

## 2022-04-18 ENCOUNTER — Other Ambulatory Visit: Payer: Self-pay | Admitting: Cardiovascular Disease

## 2022-04-24 ENCOUNTER — Encounter: Payer: Self-pay | Admitting: Primary Care

## 2022-04-24 ENCOUNTER — Encounter: Payer: Self-pay | Admitting: *Deleted

## 2022-04-24 ENCOUNTER — Ambulatory Visit (INDEPENDENT_AMBULATORY_CARE_PROVIDER_SITE_OTHER): Payer: BC Managed Care – PPO | Admitting: Primary Care

## 2022-04-24 VITALS — BP 136/76 | HR 76 | Temp 97.8°F | Ht 64.0 in | Wt 170.0 lb

## 2022-04-24 DIAGNOSIS — K219 Gastro-esophageal reflux disease without esophagitis: Secondary | ICD-10-CM | POA: Diagnosis not present

## 2022-04-24 DIAGNOSIS — G5601 Carpal tunnel syndrome, right upper limb: Secondary | ICD-10-CM | POA: Diagnosis not present

## 2022-04-24 DIAGNOSIS — E1165 Type 2 diabetes mellitus with hyperglycemia: Secondary | ICD-10-CM | POA: Diagnosis not present

## 2022-04-24 DIAGNOSIS — Z0001 Encounter for general adult medical examination with abnormal findings: Secondary | ICD-10-CM

## 2022-04-24 DIAGNOSIS — I10 Essential (primary) hypertension: Secondary | ICD-10-CM | POA: Diagnosis not present

## 2022-04-24 DIAGNOSIS — D509 Iron deficiency anemia, unspecified: Secondary | ICD-10-CM | POA: Diagnosis not present

## 2022-04-24 DIAGNOSIS — Z8774 Personal history of (corrected) congenital malformations of heart and circulatory system: Secondary | ICD-10-CM

## 2022-04-24 DIAGNOSIS — I8392 Asymptomatic varicose veins of left lower extremity: Secondary | ICD-10-CM

## 2022-04-24 DIAGNOSIS — F411 Generalized anxiety disorder: Secondary | ICD-10-CM

## 2022-04-24 LAB — COMPREHENSIVE METABOLIC PANEL
ALT: 18 U/L (ref 0–35)
AST: 16 U/L (ref 0–37)
Albumin: 4.6 g/dL (ref 3.5–5.2)
Alkaline Phosphatase: 83 U/L (ref 39–117)
BUN: 15 mg/dL (ref 6–23)
CO2: 28 mEq/L (ref 19–32)
Calcium: 9.6 mg/dL (ref 8.4–10.5)
Chloride: 97 mEq/L (ref 96–112)
Creatinine, Ser: 0.7 mg/dL (ref 0.40–1.20)
GFR: 95.59 mL/min (ref >60.00)
Glucose, Bld: 142 mg/dL — ABNORMAL HIGH (ref 70–99)
Potassium: 4.4 mEq/L (ref 3.5–5.1)
Sodium: 134 mEq/L — ABNORMAL LOW (ref 135–145)
Total Bilirubin: 0.4 mg/dL (ref 0.2–1.2)
Total Protein: 7.6 g/dL (ref 6.0–8.3)

## 2022-04-24 LAB — IBC + FERRITIN
Ferritin: 56 ng/mL (ref 10.0–291.0)
Iron: 60 ug/dL (ref 42–145)
Saturation Ratios: 16.8 % — ABNORMAL LOW (ref 20.0–50.0)
TIBC: 357 ug/dL (ref 250.0–450.0)
Transferrin: 255 mg/dL (ref 212.0–360.0)

## 2022-04-24 LAB — LIPID PANEL
Cholesterol: 136 mg/dL (ref 0–200)
HDL: 44.3 mg/dL (ref >39.00)
LDL Cholesterol: 77 mg/dL (ref 0–99)
NonHDL: 91.52
Total CHOL/HDL Ratio: 3
Triglycerides: 74 mg/dL (ref 0.0–149.0)
VLDL: 14.8 mg/dL (ref 0.0–40.0)

## 2022-04-24 LAB — MICROALBUMIN / CREATININE URINE RATIO
Creatinine,U: 49.3 mg/dL
Microalb Creat Ratio: 1.4 mg/g (ref 0.0–30.0)
Microalb, Ur: <0.7 mg/dL (ref 0.0–1.9)

## 2022-04-24 LAB — CBC
HCT: 37.1 % (ref 36.0–46.0)
Hemoglobin: 12.7 g/dL (ref 12.0–15.0)
MCHC: 34.3 g/dL (ref 30.0–36.0)
MCV: 91 fl (ref 78.0–100.0)
Platelets: 180 10*3/uL (ref 150.0–400.0)
RBC: 4.08 Mil/uL (ref 3.87–5.11)
RDW: 12.9 % (ref 11.5–15.5)
WBC: 4.4 10*3/uL (ref 4.0–10.5)

## 2022-04-24 LAB — HEMOGLOBIN A1C: Hgb A1c MFr Bld: 7.5 % — ABNORMAL HIGH (ref 4.6–6.5)

## 2022-04-24 NOTE — Assessment & Plan Note (Signed)
Repeat iron studies pending.

## 2022-04-24 NOTE — Assessment & Plan Note (Signed)
Repeat A1C pending.  Continue metformin XR 1000 mg daily.  Foot and eye exams UTD. Pneumonia vaccine UTD.  Follow up in 3-6 months.

## 2022-04-24 NOTE — Assessment & Plan Note (Signed)
Stable. Reviewed echocardiogram from May 2023.

## 2022-04-24 NOTE — Assessment & Plan Note (Signed)
Progressing.  Referral placed for vascular services.

## 2022-04-24 NOTE — Assessment & Plan Note (Signed)
Slightly higher today, home readings are better.  Continue losartan-HCTZ 100-12.5 mg daily. CMP pending.

## 2022-04-24 NOTE — Assessment & Plan Note (Signed)
Controlled.  Continue pantoprazole 40 mg daily. Follows with GI.

## 2022-04-24 NOTE — Progress Notes (Signed)
Subjective:    Patient ID: Hannah Rocha, female    DOB: 13-Mar-1966, 57 y.o.   MRN: [id number]  HPI  Hannah Rocha is a very pleasant 57 y.o. female who presents today for complete physical and follow up of chronic conditions.  She would also like to discuss several concerns.  1) Carpal Tunnel Syndrome: Chronic for years to the right hand and wrist. Previously following with orthopedics years ago and received steroid injection which helped for years. Over the last 2-3 months she's noticed a return in symptoms. She is interested in another referral to orthopedics. She does not currently wear a brace. 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   2) Varicose Veins: Chronic, progressing over the years. Located to the left lower extremity. She denies pain. She does not wear compression stockings. She sits and stands at work, has a standing desk. She would like treatment for this.   Immunizations: -Tetanus: Completed in 2019 -Influenza: Completed this season -Shingles: Completed Shingrix series -Pneumonia: Completed in 2019  Diet: [place].  Exercise: No regular exercise.  Eye exam: Completes annually  Dental exam: Completes semi-annually   Pap Smear: Hysterectomy  Mammogram: Completed in August 2023  Colonoscopy: Completed in 2019, due October 2024  BP Readings from Last 3 Encounters:  04/24/22 136/76  04/14/22 128/72  04/03/22 (!) 150/84        Review of Systems  Constitutional:  Negative for unexpected weight change.  HENT:  Negative for rhinorrhea.   Eyes:  Negative for visual disturbance.  Respiratory:  Negative for cough and shortness of breath.   Cardiovascular:  Negative for chest pain.  Gastrointestinal:  Negative for constipation and diarrhea.  Genitourinary:  Negative for difficulty urinating.  Musculoskeletal:  Positive for arthralgias.  Skin:  Negative for rash.  Allergic/Immunologic: Negative for environmental allergies.   Neurological:  Positive for numbness. Negative for dizziness and headaches.  Hematological:        Varicose veins to left lower extremity  Psychiatric/Behavioral:  The patient is not nervous/anxious.          Past Medical History:  Diagnosis Date   Allergy    Anemia    hx   Anxiety    CARDIAC MURMUR, HX OF 05/03/2007   Annotation: ASD Qualifier: Diagnosis of  By: Council Mechanic MD, Hilaria Ota    Cataract    FLOATING CATARACT    De Quervain's tenosynovitis, right 03/29/2019   Diabetes mellitus without complication ([place])    Dysplastic nevus 01/14/2015   right spinal mid back, severe, excised 03/04/2015   Dysplastic nevus 02/08/2018   right upper back/post shoulder, moderate   Dysplastic nevus 02/08/2018   left spinal mid upper back, severe, excised 03/21/2018   Generalized abdominal pain 09/19/2021   GERD (gastroesophageal reflux disease)    H/O hiatal hernia    Heart murmur    ASD repair 1999- no meds    HIATAL HERNIA 07/09/2008   Qualifier: Diagnosis of  By: Nils Pyle CMA (AAMA), Leisha     Hx of dysplastic nevus    multiple sites, some severe   Hyperlipidemia    IBS 06/21/2009   Qualifier: Diagnosis of  By: Sharlett Iles MD Byrd Hesselbach    MENOPAUSAL SYNDROME 01/15/2010   Qualifier: Diagnosis of  By: Deborra Medina MD, Talia     Muscle tightness 09/19/2021   Obesity    Plant allergic contact dermatitis 10/13/2018   PONV (postoperative nausea and vomiting)    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   Annotation: REPAIRED AS CHILD Qualifier: Diagnosis of  By: Council Mechanic MD, Hilaria Ota     Social History   Socioeconomic History   Marital status: Married    Spouse name: Not on file   Number of children: 0   Years of education: Not on file   Highest education level: Not on file  Occupational History   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    Employer: [employer]  Tobacco Use   Smoking status: Former    Packs/day: 0.00    Years: 0.00    Total pack years:  0.00    Types: Cigarettes    Quit date: 02/21/1987    Years since quitting: 35.1   Smokeless tobacco: Never   Tobacco comments:    22 years ago-light smoker  Vaping Use   Vaping Use: Never used  Substance and Sexual Activity   Alcohol use: Yes    Alcohol/week: 0.0 standard drinks of alcohol    Comment: occasional - hard apple cider, shot   Drug use: No   Sexual activity: Yes    Partners: Male    Birth control/protection: Surgical    Comment: tubaligation/hysterctomy  Other Topics Concern   Not on file  Social History Narrative   Daily caffeine use   Social Determinants of Health   Financial Resource Strain: Not on file  Food Insecurity: Not on file  Transportation Needs: Not on file  Physical Activity: Not on file  Stress: Not on file  Social Connections: Not on file  Intimate Partner Violence: Not on file    Past Surgical History:  Procedure Laterality Date   ASD REPAIR  1999   [facility]   BREAST CYST ASPIRATION Right 2013   CATARACT EXTRACTION Bilateral    Right done September 2023 and Left January 08 2022   CATARACT EXTRACTION Bilateral 12/21/2021   CLEFT LIP REPAIR     COLONOSCOPY     LAPAROSCOPIC ASSISTED VAGINAL HYSTERECTOMY Bilateral 04/13/2013   Procedure: LAPAROSCOPIC ASSISTED VAGINAL HYSTERECTOMY;  Surgeon: Emily Filbert, MD;  Location: [facility];  Service: Gynecology;  Laterality: Bilateral;- pt states was abdominal hysterectomy    LAPAROSCOPIC TUBAL LIGATION  03/21/2012   Procedure: LAPAROSCOPIC TUBAL LIGATION;  Surgeon: Emily Filbert, MD;  Location: [facility];  Service: Gynecology;  Laterality: Bilateral;   MANDIBLE FRACTURE SURGERY     Plantar Fascitis Repair Bilateral 01/2015    Family History  Problem Relation Age of Onset   Heart disease Mother    Colon polyps Mother    Diabetes Mother    Prostate cancer Father    Heart disease Father    Cancer Father        prostate   Diabetes Maternal Aunt    Diabetes Paternal Grandmother    Fibromyalgia Sister     Colon cancer Neg Hx    Breast cancer Neg Hx    Esophageal cancer Neg Hx    Rectal cancer Neg Hx    Stomach cancer Neg Hx     Allergies  Allergen Reactions   Sertraline Hcl     REACTION: vomiting, severe anxiety    Current Outpatient Medications on File Prior to Visit  Medication Sig Dispense Refill   acetaminophen (TYLENOL) 500 MG tablet Take 500 mg by mouth every 6 (six) hours as needed for mild pain.      B Complex Vitamins (VITAMIN B COMPLEX PO) Take 1 tablet by mouth daily.     busPIRone (BUSPAR) 30  MG tablet TAKE 1 TABLET BY MOUTH EVERY MORNING 1/2 TAB AT LUNCH, AND 1/2 TAB AT BEDTIME FOR ANXIETY. 180 tablet 0   ciclopirox (LOPROX) 0.77 % cream Apply topically 2 (two) times daily. Apply to affected area of left thigh for 2 weeks. 30 g 0   escitalopram (LEXAPRO) 10 MG tablet Take 1 tablet (10 mg total) by mouth daily. For anxiety. 90 tablet 3   flintstones complete (FLINTSTONES) 60 MG chewable tablet Chew 1 tablet by mouth daily.     ibuprofen (ADVIL) 800 MG tablet Take 1 tablet (800 mg total) by mouth every 8 (eight) hours as needed. 60 tablet 3   losartan-hydrochlorothiazide (HYZAAR) 100-12.5 MG tablet Take 1 tablet by mouth daily. 90 tablet 3   metFORMIN (GLUCOPHAGE-XR) 500 MG 24 hr tablet TAKE 2 TABLETS (1,000 MG TOTAL) BY MOUTH DAILY WITH BREAKFAST. FOR DIABETES. 180 tablet 1   ofloxacin (OCUFLOX) 0.3 % ophthalmic solution 3 times a day for a week 2 times a day for a week  1 times for 14 days     pantoprazole (PROTONIX) 40 MG tablet TAKE 1 TABLET BY MOUTH EVERY DAY 90 tablet 3   rosuvastatin (CRESTOR) 20 MG tablet Take 1 tablet (20 mg total) by mouth daily. For cholesterol. 90 tablet 3   ketorolac (ACULAR) 0.5 % ophthalmic solution 1 drop. 3 times a day for a week 2 times a day for a week  1 times for 14 days (Patient not taking: Reported on 04/14/2022)     sulfamethoxazole-trimethoprim (BACTRIM DS) 800-160 MG tablet Take 1 tablet by mouth 2 (two) times daily. (Patient not taking:  Reported on 04/24/2022) 6 tablet 0   No current facility-administered medications on file prior to visit.    BP 136/76   Pulse 76   Temp 97.8 F (36.6 C) (Temporal)   Ht '5\' 4"'$  (1.626 m)   Wt 170 lb (77.1 kg)   LMP 03/24/2013   SpO2 100%   BMI 29.18 kg/m  Objective:   Physical Exam HENT:     Right Ear: Tympanic membrane and ear canal normal.     Left Ear: Tympanic membrane and ear canal normal.     Nose: Nose normal.  Eyes:     Conjunctiva/sclera: Conjunctivae normal.     Pupils: Pupils are equal, round, and reactive to light.  Neck:     Thyroid: No thyromegaly.  Cardiovascular:     Rate and Rhythm: Normal rate and regular rhythm.     Heart sounds: No murmur heard. Pulmonary:     Effort: Pulmonary effort is normal.     Breath sounds: Normal breath sounds. No rales.  Abdominal:     General: Bowel sounds are normal.     Palpations: Abdomen is soft.     Tenderness: There is no abdominal tenderness.  Musculoskeletal:        General: Normal range of motion.     Cervical back: Neck supple.  Lymphadenopathy:     Cervical: No cervical adenopathy.  Skin:    General: Skin is warm and dry.     Findings: No rash.  Neurological:     Mental Status: She is alert and oriented to person, place, and time.     Cranial Nerves: No cranial nerve deficit.     Deep Tendon Reflexes: Reflexes are normal and symmetric.  Psychiatric:        Mood and Affect: Mood normal.           Assessment & Plan:  Encounter for annual general medical examination with abnormal findings in adult Assessment & Plan: Immunizations UTD. Mammogram UTD. Colonoscopy UTD, due Fall 2024. She is aware.  Discussed the importance of a healthy diet and regular exercise in order for weight loss, and to reduce the risk of further co-morbidity.  Exam stable. Labs pending.  Follow up in 1 year for repeat physical.    Essential hypertension Assessment & Plan: Slightly higher today, home readings are  better.  Continue losartan-HCTZ 100-12.5 mg daily. CMP pending.  Orders: -     Comprehensive metabolic panel -     CBC  Gastroesophageal reflux disease without esophagitis Assessment & Plan: Controlled.  Continue pantoprazole 40 mg daily. Follows with GI.   Type 2 diabetes mellitus with hyperglycemia, without long-term current use of insulin (HCC) Assessment & Plan: Repeat A1C pending.  Continue metformin XR 1000 mg daily.  Foot and eye exams UTD. Pneumonia vaccine UTD.  Follow up in 3-6 months.  Orders: -     Microalbumin / creatinine urine ratio -     Lipid panel -     Hemoglobin A1c  Iron deficiency anemia, unspecified iron deficiency anemia type Assessment & Plan: Repeat iron studies pending.  Orders: -     IBC + Ferritin  GAD (generalized anxiety disorder) Assessment & Plan: Controlled.  Continue Lexapro 10 mg daily and Buspirone 30 mg in AM, 15 mg in afternoon, 15 mg HS.    History of atrial septal defect repair Assessment & Plan: Stable. Reviewed echocardiogram from May 2023.   Carpal tunnel syndrome of right wrist Assessment & Plan: Deteriorated.   Referral placed to orthopedics per patient request.    Orders: -     Ambulatory referral to Orthopedic Surgery  Varicose veins of left lower extremity, unspecified whether complicated -     Ambulatory referral to Vascular Surgery        Pleas Koch, NP

## 2022-04-24 NOTE — Assessment & Plan Note (Signed)
Controlled.  Continue Lexapro 10 mg daily and Buspirone 30 mg in AM, 15 mg in afternoon, 15 mg HS.

## 2022-04-24 NOTE — Patient Instructions (Signed)
You will either be contacted via phone regarding your referral to vascular services and orthopedics, or you may receive a letter on your MyChart portal from our referral team with instructions for scheduling an appointment. Please let us know if you have not been contacted by anyone within two weeks.  Stop by the lab prior to leaving today. I will notify you of your results once received.   Please schedule a follow up visit for 6 months for a diabetes check.  It was a pleasure to see you today!

## 2022-04-24 NOTE — Assessment & Plan Note (Signed)
Deteriorated.   Referral placed to orthopedics per patient request.

## 2022-04-24 NOTE — Assessment & Plan Note (Signed)
Immunizations UTD. Mammogram UTD. Colonoscopy UTD, due Fall 2024. She is aware.  Discussed the importance of a healthy diet and regular exercise in order for weight loss, and to reduce the risk of further co-morbidity.  Exam stable. Labs pending.  Follow up in 1 year for repeat physical.

## 2022-04-25 ENCOUNTER — Other Ambulatory Visit: Payer: Self-pay | Admitting: Primary Care

## 2022-04-25 DIAGNOSIS — F411 Generalized anxiety disorder: Secondary | ICD-10-CM

## 2022-04-25 DIAGNOSIS — E119 Type 2 diabetes mellitus without complications: Secondary | ICD-10-CM

## 2022-04-28 DIAGNOSIS — E1165 Type 2 diabetes mellitus with hyperglycemia: Secondary | ICD-10-CM

## 2022-04-29 ENCOUNTER — Encounter: Payer: Self-pay | Admitting: *Deleted

## 2022-04-29 MED ORDER — TIRZEPATIDE 2.5 MG/0.5ML ~~LOC~~ SOAJ: 2.5000 mg | SUBCUTANEOUS | 0 refills | Status: DC

## 2022-05-25 ENCOUNTER — Ambulatory Visit: Payer: BC Managed Care – PPO | Admitting: Dermatology

## 2022-05-25 ENCOUNTER — Other Ambulatory Visit: Payer: Self-pay | Admitting: Primary Care

## 2022-05-25 VITALS — BP 133/69 | HR 67

## 2022-05-25 DIAGNOSIS — D2262 Melanocytic nevi of left upper limb, including shoulder: Secondary | ICD-10-CM

## 2022-05-25 DIAGNOSIS — E1165 Type 2 diabetes mellitus with hyperglycemia: Secondary | ICD-10-CM

## 2022-05-25 DIAGNOSIS — D224 Melanocytic nevi of scalp and neck: Secondary | ICD-10-CM | POA: Diagnosis not present

## 2022-05-25 DIAGNOSIS — D225 Melanocytic nevi of trunk: Secondary | ICD-10-CM | POA: Diagnosis not present

## 2022-05-25 DIAGNOSIS — D492 Neoplasm of unspecified behavior of bone, soft tissue, and skin: Secondary | ICD-10-CM

## 2022-05-25 DIAGNOSIS — D2239 Melanocytic nevi of other parts of face: Secondary | ICD-10-CM | POA: Diagnosis not present

## 2022-05-25 DIAGNOSIS — D229 Melanocytic nevi, unspecified: Secondary | ICD-10-CM

## 2022-05-25 NOTE — Telephone Encounter (Signed)
Noted, Rx sent to pharmacy. 

## 2022-05-25 NOTE — Telephone Encounter (Signed)
Called and spoke with patient, her side effects including the nausea have subsided. She is ready for the '5mg'$  weekly dose.

## 2022-05-25 NOTE — Patient Instructions (Addendum)
Wound Care Instructions  Cleanse wound gently with soap and water once a day then pat dry with clean gauze. Apply a thin coat of Petrolatum (petroleum jelly, "Vaseline") over the wound (unless you have an allergy to this). We recommend that you use a new, sterile tube of Vaseline. Do not pick or remove scabs. Do not remove the yellow or white "healing tissue" from the base of the wound.  Cover the wound with fresh, clean, nonstick gauze and secure with paper tape. You may use Band-Aids in place of gauze and tape if the wound is small enough, but would recommend trimming much of the tape off as there is often too much. Sometimes Band-Aids can irritate the skin.  You should call the office for your biopsy report after 1 week if you have not already been contacted.  If you experience any problems, such as abnormal amounts of bleeding, swelling, significant bruising, significant pain, or evidence of infection, please call the office immediately.  FOR ADULT SURGERY PATIENTS: If you need something for pain relief you may take 1 extra strength Tylenol (acetaminophen) AND 2 Ibuprofen (200mg each) together every 4 hours as needed for pain. (do not take these if you are allergic to them or if you have a reason you should not take them.) Typically, you may only need pain medication for 1 to 3 days.     Due to recent changes in healthcare laws, you may see results of your pathology and/or laboratory studies on MyChart before the doctors have had a chance to review them. We understand that in some cases there may be results that are confusing or concerning to you. Please understand that not all results are received at the same time and often the doctors may need to interpret multiple results in order to provide you with the best plan of care or course of treatment. Therefore, we ask that you please give us 2 business days to thoroughly review all your results before contacting the office for clarification. Should  we see a critical lab result, you will be contacted sooner.   If You Need Anything After Your Visit  If you have any questions or concerns for your doctor, please call our main line at [phone] and press option 4 to reach your doctor's medical assistant. If no one answers, please leave a voicemail as directed and we will return your call as soon as possible. Messages left after 4 pm will be answered the following business day.   You may also send us a message via MyChart. We typically respond to MyChart messages within 1-2 business days.  For prescription refills, please ask your pharmacy to contact our office. Our fax number is [phone].  If you have an urgent issue when the clinic is closed that cannot wait until the next business day, you can page your doctor at the number below.    Please note that while we do our best to be available for urgent issues outside of office hours, we are not available 24/7.   If you have an urgent issue and are unable to reach us, you may choose to seek medical care at your doctor's office, retail clinic, urgent care center, or emergency room.  If you have a medical emergency, please immediately call 911 or go to the emergency department.  Pager Numbers  - Dr. Kowalski: [phone]  - Dr. Moye: [phone]  - Dr. Stewart: [phone]  In the event of inclement weather, please call our main line at   [phone] for an update on the status of any delays or closures.  Dermatology Medication Tips: Please keep the boxes that topical medications come in in order to help keep track of the instructions about where and how to use these. Pharmacies typically print the medication instructions only on the boxes and not directly on the medication tubes.   If your medication is too expensive, please contact our office at [phone] option 4 or send us a message through MyChart.   We are unable to tell what your co-pay for medications will be in  advance as this is different depending on your insurance coverage. However, we may be able to find a substitute medication at lower cost or fill out paperwork to get insurance to cover a needed medication.   If a prior authorization is required to get your medication covered by your insurance company, please allow us 1-2 business days to complete this process.  Drug prices often vary depending on where the prescription is filled and some pharmacies may offer cheaper prices.  The website www.goodrx.com contains coupons for medications through different pharmacies. The prices here do not account for what the cost may be with help from insurance (it may be cheaper with your insurance), but the website can give you the price if you did not use any insurance.  - You can print the associated coupon and take it with your prescription to the pharmacy.  - You may also stop by our office during regular business hours and pick up a GoodRx coupon card.  - If you need your prescription sent electronically to a different pharmacy, notify our office through [facility] MyChart or by phone at [phone] option 4.     Si Usted Necesita Algo Despus de Su Visita  Tambin puede enviarnos un mensaje a travs de MyChart. Por lo general respondemos a los mensajes de MyChart en el transcurso de 1 a 2 das hbiles.  Para renovar recetas, por favor pida a su farmacia que se ponga en contacto con nuestra oficina. Nuestro nmero de fax es el [phone].  Si tiene un asunto urgente cuando la clnica est cerrada y que no puede esperar hasta el siguiente da hbil, puede llamar/localizar a su doctor(a) al nmero que aparece a continuacin.   Por favor, tenga en cuenta que aunque hacemos todo lo posible para estar disponibles para asuntos urgentes fuera del horario de oficina, no estamos disponibles las 24 horas del da, los 7 das de la semana.   Si tiene un problema urgente y no puede comunicarse con nosotros, puede  optar por buscar atencin mdica  en el consultorio de su doctor(a), en una clnica privada, en un centro de atencin urgente o en una sala de emergencias.  Si tiene una emergencia mdica, por favor llame inmediatamente al 911 o vaya a la sala de emergencias.  Nmeros de bper  - Dr. Kowalski: [phone]  - Dra. Moye: [phone]  - Dra. Stewart: [phone]  En caso de inclemencias del tiempo, por favor llame a nuestra lnea principal al [phone] para una actualizacin sobre el estado de cualquier retraso o cierre.  Consejos para la medicacin en dermatologa: Por favor, guarde las cajas en las que vienen los medicamentos de uso tpico para ayudarle a seguir las instrucciones sobre dnde y cmo usarlos. Las farmacias generalmente imprimen las instrucciones del medicamento slo en las cajas y no directamente en los tubos del medicamento.   Si su medicamento es muy caro, por favor, pngase en contacto con   nuestra oficina llamando al [phone] y presione la opcin 4 o envenos un mensaje a travs de MyChart.   No podemos decirle cul ser su copago por los medicamentos por adelantado ya que esto es diferente dependiendo de la cobertura de su seguro. Sin embargo, es posible que podamos encontrar un medicamento sustituto a menor costo o llenar un formulario para que el seguro cubra el medicamento que se considera necesario.   Si se requiere una autorizacin previa para que su compaa de seguros cubra su medicamento, por favor permtanos de 1 a 2 das hbiles para completar este proceso.  Los precios de los medicamentos varan con frecuencia dependiendo del lugar de dnde se surte la receta y alguna farmacias pueden ofrecer precios ms baratos.  El sitio web www.goodrx.com tiene cupones para medicamentos de diferentes farmacias. Los precios aqu no tienen en cuenta lo que podra costar con la ayuda del seguro (puede ser ms barato con su seguro), pero el sitio web puede darle el  precio si no utiliz ningn seguro.  - Puede imprimir el cupn correspondiente y llevarlo con su receta a la farmacia.  - Tambin puede pasar por nuestra oficina durante el horario de atencin regular y recoger una tarjeta de cupones de GoodRx.  - Si necesita que su receta se enve electrnicamente a una farmacia diferente, informe a nuestra oficina a travs de MyChart de [facility] o por telfono llamando al [phone] y presione la opcin 4.  

## 2022-05-25 NOTE — Progress Notes (Signed)
Follow-Up Visit   Subjective  Hannah Rocha is a 57 y.o. female who presents for the following: Irritated Nevi (L jaw, neck, R chin, L shoulder and R sternum, pt would like removed).   The following portions of the chart were reviewed this encounter and updated as appropriate:       Review of Systems:  No other skin or systemic complaints except as noted in HPI or Assessment and Plan.  Objective  Well appearing patient in no apparent distress; mood and affect are within normal limits.  A focused examination was performed including face, neck, chest, shoulder. Relevant physical exam findings are noted in the Assessment and Plan.  L jaw superior Flesh pap 0.5cm      L Neck Flesh pap 0.8cm     L ant shoulder Flesh pap 0.8cm     L jaw inferior Flesh pap 1.0cm     R sternum, R chin, spinal lower back Flesh paps    Assessment & Plan  Neoplasm of skin (4) L jaw superior  Epidermal / dermal shaving  Lesion diameter (cm):  0.6 Informed consent: discussed and consent obtained   Patient was prepped and draped in usual sterile fashion: area prepped with alcohol. Anesthesia: the lesion was anesthetized in a standard fashion   Anesthetic:  1% lidocaine w/ epinephrine 1-100,000 buffered w/ 8.4% NaHCO3 Instrument used: flexible razor blade   Hemostasis achieved with: pressure, aluminum chloride and electrodesiccation   Outcome: patient tolerated procedure well   Post-procedure details: wound care instructions given   Post-procedure details comment:  Ointment and small bandage applied  Specimen 1 - Surgical pathology Differential Diagnosis: D48.5 Irritated Nevus r/o Atypia  Check Margins: yes Flesh pap 0.5cm  L Neck  Epidermal / dermal shaving  Lesion diameter (cm):  1 Informed consent: discussed and consent obtained   Patient was prepped and draped in usual sterile fashion: area prepped with alcohol. Anesthesia: the lesion was anesthetized in a  standard fashion   Anesthetic:  1% lidocaine w/ epinephrine 1-100,000 buffered w/ 8.4% NaHCO3 Instrument used: flexible razor blade   Hemostasis achieved with: pressure, aluminum chloride and electrodesiccation   Outcome: patient tolerated procedure well   Post-procedure details: wound care instructions given   Post-procedure details comment:  Ointment and small bandage applied  Specimen 2 - Surgical pathology Differential Diagnosis: D48.5 Irritated Nevus r/o Atypia  Check Margins: yes Flesh pap 0.8cm  L ant shoulder  Epidermal / dermal shaving  Lesion diameter (cm):  1 Informed consent: discussed and consent obtained   Patient was prepped and draped in usual sterile fashion: area prepped with alcohol. Anesthesia: the lesion was anesthetized in a standard fashion   Anesthetic:  1% lidocaine w/ epinephrine 1-100,000 buffered w/ 8.4% NaHCO3 Instrument used: flexible razor blade   Hemostasis achieved with: pressure, aluminum chloride and electrodesiccation   Outcome: patient tolerated procedure well   Post-procedure details: wound care instructions given   Post-procedure details comment:  Ointment and small bandage applied  Specimen 4 - Surgical pathology Differential Diagnosis: D48.5 Irritated Nevus r/o Atypia  Check Margins: yes Flesh pap 0.8cm  L jaw inferior  Epidermal / dermal shaving  Lesion diameter (cm):  1.1 Informed consent: discussed and consent obtained   Patient was prepped and draped in usual sterile fashion: area prepped with alcohol. Anesthesia: the lesion was anesthetized in a standard fashion   Anesthetic:  1% lidocaine w/ epinephrine 1-100,000 buffered w/ 8.4% NaHCO3 Instrument used: flexible razor blade   Hemostasis achieved  with: pressure, aluminum chloride and electrodesiccation   Outcome: patient tolerated procedure well   Post-procedure details: wound care instructions given   Post-procedure details comment:  Ointment and small bandage  applied  Specimen 3 - Surgical pathology Differential Diagnosis: D48.5 Irritated Nevus r/o Atypia  Check Margins: yes Flesh pap 1.0cm  Nevus R sternum, R chin, spinal lower back  Irritated Nevi, plan shave removal on f/u  Benign-appearing. Call clinic for new or changing lesions.  Recommend daily use of broad spectrum spf 30+ sunscreen to sun-exposed areas. -   Return for shave removal  to R sternum R chin spinal lower back on f/u.  I, Othelia Pulling, RMA, am acting as scribe for Brendolyn Patty, MD .  Documentation: I have reviewed the above documentation for accuracy and completeness, and I agree with the above.  Brendolyn Patty MD

## 2022-05-25 NOTE — Telephone Encounter (Signed)
Please call patient:  Is she ready for the increased dose of Mounjaro to 5 mg weekly? How are her side effects?

## 2022-05-26 DIAGNOSIS — R600 Localized edema: Secondary | ICD-10-CM | POA: Diagnosis not present

## 2022-05-26 DIAGNOSIS — L299 Pruritus, unspecified: Secondary | ICD-10-CM | POA: Diagnosis not present

## 2022-05-26 DIAGNOSIS — R252 Cramp and spasm: Secondary | ICD-10-CM | POA: Diagnosis not present

## 2022-05-26 DIAGNOSIS — I83892 Varicose veins of left lower extremities with other complications: Secondary | ICD-10-CM | POA: Diagnosis not present

## 2022-05-27 ENCOUNTER — Telehealth: Payer: Self-pay

## 2022-05-27 NOTE — Telephone Encounter (Signed)
Left pt msg to call for bx results/sh 

## 2022-05-27 NOTE — Telephone Encounter (Signed)
-----   Message from Brendolyn Patty, MD sent at 05/27/2022  5:12 PM EST ----- 1. Skin , left jaw superior MELANOCYTIC NEVUS, INTRADERMAL TYPE, BASE INVOLVED 2. Skin , left neck MELANOCYTIC NEVUS, INTRADERMAL TYPE, BASE INVOLVED 3. Skin , left jaw inferior MELANOCYTIC NEVUS, INTRADERMAL TYPE, BASE INVOLVED 4. Skin , left ant shoulder MELANOCYTIC NEVUS, INTRADERMAL TYPE, BASE INVOLVED  1-4 all benign moles, may recur - please call patient

## 2022-05-28 ENCOUNTER — Telehealth: Payer: Self-pay

## 2022-05-28 NOTE — Telephone Encounter (Signed)
-----   Message from Brendolyn Patty, MD sent at 05/27/2022  5:12 PM EST ----- 1. Skin , left jaw superior MELANOCYTIC NEVUS, INTRADERMAL TYPE, BASE INVOLVED 2. Skin , left neck MELANOCYTIC NEVUS, INTRADERMAL TYPE, BASE INVOLVED 3. Skin , left jaw inferior MELANOCYTIC NEVUS, INTRADERMAL TYPE, BASE INVOLVED 4. Skin , left ant shoulder MELANOCYTIC NEVUS, INTRADERMAL TYPE, BASE INVOLVED  1-4 all benign moles, may recur - please call patient

## 2022-05-28 NOTE — Telephone Encounter (Signed)
Patient advised of BX results and questions answered. aw

## 2022-06-02 ENCOUNTER — Telehealth: Payer: Self-pay | Admitting: Gastroenterology

## 2022-06-02 NOTE — Telephone Encounter (Signed)
The pt was recently put on weight loss injection Mounjaro and has developed reflux. She has been advised to call the MD that prescribed and make them aware. She will call and discuss and call our office back if she needs to see GI.

## 2022-06-02 NOTE — Telephone Encounter (Signed)
Left message on machine to call back  

## 2022-06-02 NOTE — Telephone Encounter (Signed)
PT is calling to speak about some symptoms that she is experiencing and wishes not to disclose with me. Please advise.

## 2022-06-02 NOTE — Telephone Encounter (Signed)
Patient returned your call, please advise. 

## 2022-06-08 ENCOUNTER — Other Ambulatory Visit: Payer: Self-pay

## 2022-06-08 MED ORDER — LOSARTAN POTASSIUM-HCTZ 100-12.5 MG PO TABS: 1.0000 | ORAL_TABLET | Freq: Every day | ORAL | 0 refills | Status: DC

## 2022-06-08 NOTE — Telephone Encounter (Signed)
Pt's medication was sent to pt's pharmacy as requested. Confirmation received.  °

## 2022-06-15 ENCOUNTER — Encounter: Payer: Self-pay | Admitting: Dermatology

## 2022-06-15 ENCOUNTER — Other Ambulatory Visit: Payer: Self-pay | Admitting: Primary Care

## 2022-06-15 ENCOUNTER — Ambulatory Visit: Payer: BC Managed Care – PPO | Admitting: Dermatology

## 2022-06-15 VITALS — BP 139/76 | HR 77

## 2022-06-15 DIAGNOSIS — D225 Melanocytic nevi of trunk: Secondary | ICD-10-CM | POA: Diagnosis not present

## 2022-06-15 DIAGNOSIS — D2239 Melanocytic nevi of other parts of face: Secondary | ICD-10-CM

## 2022-06-15 DIAGNOSIS — L309 Dermatitis, unspecified: Secondary | ICD-10-CM | POA: Diagnosis not present

## 2022-06-15 DIAGNOSIS — D492 Neoplasm of unspecified behavior of bone, soft tissue, and skin: Secondary | ICD-10-CM

## 2022-06-15 MED ORDER — KETOCONAZOLE 2 % EX CREA: 1.0000 | TOPICAL_CREAM | CUTANEOUS | 3 refills | Status: AC

## 2022-06-15 NOTE — Progress Notes (Signed)
Follow-Up Visit   Subjective  Hannah Rocha is a 57 y.o. female who presents for the following: irritated nevi to bx on R sternum, R chin, spinal lower back, rash under breast comes and goes, worse in summer.   The following portions of the chart were reviewed this encounter and updated as appropriate: medications, allergies, medical history  Review of Systems:  No other skin or systemic complaints except as noted in HPI or Assessment and Plan.  Objective  Well appearing patient in no apparent distress; mood and affect are within normal limits.   A focused examination was performed of the following areas: R sternum, R chin, spinal lower back, inframammary  Relevant exam findings are noted in the Assessment and Plan.  R inferior chin Flesh pap 0.5cm     R upper sternum Flesh pap 0.6cm     R spinal lower back Flesh pap 0.9cm       Assessment & Plan     Neoplasm of skin (3) R inferior chin  Epidermal / dermal shaving  Lesion diameter (cm):  0.5 Informed consent: discussed and consent obtained   Patient was prepped and draped in usual sterile fashion: area prepped with alcohol. Anesthesia: the lesion was anesthetized in a standard fashion   Anesthetic:  1% lidocaine w/ epinephrine 1-100,000 buffered w/ 8.4% NaHCO3 Instrument used: flexible razor blade   Hemostasis achieved with: pressure, aluminum chloride and electrodesiccation   Outcome: patient tolerated procedure well   Post-procedure details: wound care instructions given   Post-procedure details comment:  Ointment and small bandage applied  Specimen 1 - Surgical pathology Differential Diagnosis: D48.5 Irritated Nevus r/o Atypia  Check Margins: No Flesh pap 0.5cm  R upper sternum  Epidermal / dermal shaving  Lesion diameter (cm):  0.6 Informed consent: discussed and consent obtained   Patient was prepped and draped in usual sterile fashion: area prepped with alcohol. Anesthesia: the  lesion was anesthetized in a standard fashion   Anesthetic:  1% lidocaine w/ epinephrine 1-100,000 buffered w/ 8.4% NaHCO3 Instrument used: flexible razor blade   Hemostasis achieved with: pressure, aluminum chloride and electrodesiccation   Outcome: patient tolerated procedure well   Post-procedure details: wound care instructions given   Post-procedure details comment:  Ointment and small bandage applied  Specimen 2 - Surgical pathology Differential Diagnosis: D48.5 Irritated Nevus r/o Atypia  Check Margins: No Flesh pap 0.6cm  R spinal lower back  Epidermal / dermal shaving  Lesion diameter (cm):  0.9 Informed consent: discussed and consent obtained   Patient was prepped and draped in usual sterile fashion: area prepped with alcohol. Anesthesia: the lesion was anesthetized in a standard fashion   Anesthetic:  1% lidocaine w/ epinephrine 1-100,000 buffered w/ 8.4% NaHCO3 Instrument used: flexible razor blade   Hemostasis achieved with: pressure, aluminum chloride and electrodesiccation   Outcome: patient tolerated procedure well   Post-procedure details: wound care instructions given   Post-procedure details comment:  Ointment and small bandage applied  Specimen 3 - Surgical pathology Differential Diagnosis: D48.5 Irritated Nevus r/o Atypia  Check Margins: No Flesh pap 0.9cm  Discussed resulting small scar with shave removal, and possible recurrence of lesion.  Recommend vaseline ointment to area daily and cover until healed.  Recommend photoprotection/sunscreen to area to prevent discoloration of scar.  Once healed, may apply OTC Serica scar gel bid to thickened scars.    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 Condition  is bothersome/symptomatic for patient. Currently flared.   Intertrigo is a chronic recurrent rash that occurs in skin fold areas that may be associated with  friction; heat; moisture; yeast; fungus; and bacteria.  It is exacerbated by increased movement / activity; sweating; and higher atmospheric temperature.  Treatment Plan Start Ketoconazole 2% cr qd/bid aa rash under breast prn flares Recommend OTC Zeasorb AF powder to body folds daily after shower.  It is often found in the athlete's foot section in the pharmacy.  Avoid using powders that contain cornstarch.    Return if symptoms worsen or fail to improve.  I, Othelia Pulling, RMA, am acting as scribe for Brendolyn Patty, MD .   Documentation: I have reviewed the above documentation for accuracy and completeness, and I agree with the above.  Brendolyn Patty, MD

## 2022-06-15 NOTE — Patient Instructions (Addendum)
Wound Care Instructions  Cleanse wound gently with soap and water once a day then pat dry with clean gauze. Apply a thin coat of Petrolatum (petroleum jelly, "Vaseline") over the wound (unless you have an allergy to this). We recommend that you use a new, sterile tube of Vaseline. Do not pick or remove scabs. Do not remove the yellow or white "healing tissue" from the base of the wound.  Cover the wound with fresh, clean, nonstick gauze and secure with paper tape. You may use Band-Aids in place of gauze and tape if the wound is small enough, but would recommend trimming much of the tape off as there is often too much. Sometimes Band-Aids can irritate the skin.  You should call the office for your biopsy report after 1 week if you have not already been contacted.  If you experience any problems, such as abnormal amounts of bleeding, swelling, significant bruising, significant pain, or evidence of infection, please call the office immediately.  FOR ADULT SURGERY PATIENTS: If you need something for pain relief you may take 1 extra strength Tylenol (acetaminophen) AND 2 Ibuprofen (200mg  each) together every 4 hours as needed for pain. (do not take these if you are allergic to them or if you have a reason you should not take them.) Typically, you may only need pain medication for 1 to 3 days.    Recommend OTC Zeasorb AF powder to body folds daily after shower.  It is often found in the athlete's foot section in the pharmacy.  Avoid using powders that contain cornstarch.    Due to recent changes in healthcare laws, you may see results of your pathology and/or laboratory studies on MyChart before the doctors have had a chance to review them. We understand that in some cases there may be results that are confusing or concerning to you. Please understand that not all results are received at the same time and often the doctors may need to interpret multiple results in order to provide you with the best plan of  care or course of treatment. Therefore, we ask that you please give [place] 2 business days to thoroughly review all your results before contacting the office for clarification. Should we see a critical lab result, you will be contacted sooner.   If You Need Anything After Your Visit  If you have any questions or concerns for your doctor, please call our main line at [phone] and press option 4 to reach your doctor's medical assistant. If no one answers, please leave a voicemail as directed and we will return your call as soon as possible. Messages left after 4 pm will be answered the following business day.   You may also send [place] a message via [place]. We typically respond to MyChart messages within 1-2 business days.  For prescription refills, please ask your pharmacy to contact our office. Our fax number is [phone].  If you have an urgent issue when the clinic is closed that cannot wait until the next business day, you can page your doctor at the number below.    Please note that while we do our best to be available for urgent issues outside of office hours, we are not available 24/7.   If you have an urgent issue and are unable to reach [place], you may choose to seek medical care at your doctor's office, retail clinic, urgent care center, or emergency room.  If you have a medical emergency, please immediately call 911 or go to the  emergency department.  Pager Numbers  - Dr. Nehemiah Massed: [phone]  - Dr. Laurence Ferrari: [phone]  - Dr. Nicole Kindred: [phone]  In the event of inclement weather, please call our main line at [phone] for an update on the status of any delays or closures.  Dermatology Medication Tips: Please keep the boxes that topical medications come in in order to help keep track of the instructions about where and how to use these. Pharmacies typically print the medication instructions only on the boxes and not directly on the medication tubes.   If your medication is  too expensive, please contact our office at [phone] option 4 or send [place] a message through [place].   We are unable to tell what your co-pay for medications will be in advance as this is different depending on your insurance coverage. However, we may be able to find a substitute medication at lower cost or fill out paperwork to get insurance to cover a needed medication.   If a prior authorization is required to get your medication covered by your insurance company, please allow [place] 1-2 business days to complete this process.  Drug prices often vary depending on where the prescription is filled and some pharmacies may offer cheaper prices.  The website www.goodrx.com contains coupons for medications through different pharmacies. The prices here do not account for what the cost may be with help from insurance (it may be cheaper with your insurance), but the website can give you the price if you did not use any insurance.  - You can print the associated coupon and take it with your prescription to the pharmacy.  - You may also stop by our office during regular business hours and pick up a GoodRx coupon card.  - If you need your prescription sent electronically to a different pharmacy, notify our office through [facility] or by phone at [phone] option 4.     Si Usted Necesita Algo Despus de Su Visita  Tambin puede enviarnos un mensaje a travs de Pharmacist, community. Por lo general respondemos a los mensajes de MyChart en el transcurso de 1 a 2 das hbiles.  Para renovar recetas, por favor pida a su farmacia que se ponga en contacto con nuestra oficina. Harland Dingwall de fax es [place] [phone].  Si tiene un asunto urgente cuando la clnica est cerrada y que no puede esperar hasta el siguiente da hbil, puede llamar/localizar a su doctor(a) al nmero que aparece a continuacin.   Por favor, tenga en cuenta que aunque hacemos todo lo posible para estar disponibles para asuntos urgentes  fuera del horario de [place], no estamos disponibles las 24 horas del da, los 7 das de la [place].   Si tiene un problema urgente y no puede comunicarse con nosotros, puede optar por buscar atencin mdica  en el consultorio de su doctor(a), en una clnica privada, en un centro de atencin urgente o en una sala de emergencias.  Si tiene Engineering geologist, por favor llame inmediatamente al 911 o vaya a la sala de emergencias.  Nmeros de bper  - Dr. Nehemiah Massed: [phone]  - Dra. Moye: [phone]  - Dra. Nicole Kindred: [phone]  En caso de inclemencias del [place], por favor llame a Johnsie Kindred principal al [phone] para una actualizacin sobre el [place] de cualquier retraso o cierre.  Consejos para la medicacin en dermatologa: Por favor, guarde las cajas en las que vienen los medicamentos de uso tpico para ayudarle a seguir las instrucciones sobre dnde y cmo usarlos. Las  farmacias generalmente imprimen las instrucciones del medicamento slo en las cajas y no directamente en los tubos del [place].   Si su medicamento es muy caro, por favor, pngase en contacto con Zigmund Daniel llamando al [phone] y presione la opcin 4 o envenos un mensaje a travs de Pharmacist, community.   No podemos decirle cul ser su copago por los medicamentos por adelantado ya que esto es diferente dependiendo de la cobertura de su seguro. Sin embargo, es posible que podamos encontrar un medicamento sustituto a Electrical engineer un formulario para que el seguro cubra el medicamento que se considera necesario.   Si se requiere una autorizacin previa para que su compaa de seguros [place] su medicamento, por favor permtanos de 1 a 2 das hbiles para completar este proceso.  Los precios de los medicamentos varan con frecuencia dependiendo del Environmental consultant de dnde se surte la receta y alguna farmacias pueden ofrecer precios ms baratos.  El sitio web www.goodrx.com tiene cupones para medicamentos de  Airline pilot. Los precios aqu no tienen en cuenta lo que podra costar con la ayuda del seguro (puede ser ms barato con su seguro), pero el sitio web puede darle el precio si no utiliz Research scientist (physical sciences).  - Puede imprimir el cupn correspondiente y llevarlo con su receta a la farmacia.  - Tambin puede pasar por nuestra oficina durante el horario de atencin regular y Charity fundraiser una tarjeta de cupones de GoodRx.  - Si necesita que su receta se enve electrnicamente a una farmacia diferente, informe a nuestra oficina a travs de MyChart de [place] o por telfono llamando al [phone] y presione la opcin 4.

## 2022-06-19 ENCOUNTER — Other Ambulatory Visit: Payer: Self-pay | Admitting: Primary Care

## 2022-06-19 DIAGNOSIS — F411 Generalized anxiety disorder: Secondary | ICD-10-CM

## 2022-06-23 ENCOUNTER — Telehealth: Payer: Self-pay

## 2022-06-23 DIAGNOSIS — G5601 Carpal tunnel syndrome, right upper limb: Secondary | ICD-10-CM | POA: Diagnosis not present

## 2022-06-23 NOTE — Telephone Encounter (Signed)
-----   Message from Brendolyn Patty, MD sent at 06/23/2022 11:05 AM EDT ----- 1. Skin , right inferior chin MELANOCYTIC NEVUS, INTRADERMAL TYPE 2. Skin , right upper sternum MELANOCYTIC NEVUS, INTRADERMAL TYPE 3. Skin , right spinal lower back MELANOCYTIC NEVUS, INTRADERMAL TYPE  1-3 benign moles - please call patient

## 2022-06-23 NOTE — Telephone Encounter (Signed)
Advised pt of bx results/sh ?

## 2022-06-24 ENCOUNTER — Other Ambulatory Visit: Payer: BC Managed Care – PPO

## 2022-07-03 ENCOUNTER — Encounter: Payer: Self-pay | Admitting: Primary Care

## 2022-07-03 ENCOUNTER — Ambulatory Visit: Payer: BC Managed Care – PPO | Admitting: Primary Care

## 2022-07-03 VITALS — BP 108/64 | HR 93 | Temp 97.5°F | Ht 64.0 in | Wt 154.0 lb

## 2022-07-03 DIAGNOSIS — E1165 Type 2 diabetes mellitus with hyperglycemia: Secondary | ICD-10-CM

## 2022-07-03 DIAGNOSIS — R197 Diarrhea, unspecified: Secondary | ICD-10-CM | POA: Diagnosis not present

## 2022-07-03 DIAGNOSIS — R112 Nausea with vomiting, unspecified: Secondary | ICD-10-CM | POA: Diagnosis not present

## 2022-07-03 HISTORY — DX: Nausea with vomiting, unspecified: R11.2

## 2022-07-03 NOTE — Telephone Encounter (Signed)
Called and scheduled patient appt with Jae Dire 2 2:20pm

## 2022-07-03 NOTE — Assessment & Plan Note (Signed)
Resolved.  Likely viral gastritis from food poisoning.  Do suspect that her near syncopal episode was dehydration from a combination of months of little PO intake, and acute diarrhea and vomiting.  Discussed with patient and her husband.  Checking CBC, BMP today. Recommended to continue hydrating and advance diet as tolerated.

## 2022-07-03 NOTE — Assessment & Plan Note (Signed)
Stop Mounjaro 5 mg due to little PO intake over the months.  Continue metformin XR 1000 mg daily.  She will update if food cravings return.  Repeat A1C in May.

## 2022-07-03 NOTE — Patient Instructions (Signed)
Stop Mounjaro.  Stop by the lab prior to leaving today. I will notify you of your results once received.   Continue to work on hydration.  It was a pleasure to see you today!

## 2022-07-03 NOTE — Progress Notes (Signed)
Subjective:    Patient ID: Hannah Rocha, female    DOB: [birth year], 57 y.o.   MRN: [id number]  Emesis  Associated symptoms include abdominal pain and diarrhea. Pertinent negatives include no dizziness.    Hannah Rocha is a very pleasant 57 y.o. female with a history of GERD, hyperlipidemia, type 2 diabetes, hypertension who presents today to discuss nausea, vomiting, diarrhea. Her husband joins [place] today.  Symptom onset 12 am on Thursday morning with nausea, vomiting, and diarrhea. Also with abdominal soreness. She had eaten at [employer] the evening before for which she believes caused her symptoms. She vomited and experienced diarrhea several times throughout the day on Thursday. She did drink water during the day.   Thursday evening she was in the kitchen standing, felt a sudden onset of nausea, clammy, and a feeling of the need to urinate and deficate. She turned around to walk and her niece caught her standing up, she was staring into space, and "her pupils dilated". She was helped to sit in a chair, began vomiting, family lowered her to the floor, she vomited, urinated, and defecated on herself. This episode lasted for 3-4 minutes. She remembers being on the floor and vomiting.   Today she's felt improved. She did feel tired when taking a shower with some flushing. Her last episode of vomiting and diarrhea was last night. She's been hydrating well water and electrolytes. She has eaten eggs, bagel, and fruit today.   She mentions that she's hardly eating much since she's been on mounjaro. She's not felt the best since starting mounjaro. She's lost 20 pounds since January 2024.   She experienced a syncopal episode in 2019, fell and collapsed. Underwent CT's which were negative. She did follow with sports medicine at the time for concussion. She has no personal or family history of seizure disorder.   Wt Readings from Last 3 Encounters:  07/03/22 154 lb (69.9 kg)   04/24/22 170 lb (77.1 kg)  04/03/22 174 lb (78.9 kg)      Review of Systems  Gastrointestinal:  Positive for abdominal pain, diarrhea, nausea and vomiting.  Neurological:  Negative for dizziness.         Past Medical History:  Diagnosis Date   Allergy    Anemia    hx   Anxiety    CARDIAC MURMUR, HX OF 05/03/2007   Annotation: ASD Qualifier: Diagnosis of  By: Hetty Ely MD, Franne Grip    Cataract    FLOATING CATARACT    De Quervain's tenosynovitis, right 03/29/2019   Diabetes mellitus without complication    Dysplastic nevus 01/14/2015   right spinal mid back, severe, excised 03/04/2015   Dysplastic nevus 02/08/2018   right upper back/post shoulder, moderate   Dysplastic nevus 02/08/2018   left spinal mid upper back, severe, excised 03/21/2018   Generalized abdominal pain 09/19/2021   GERD (gastroesophageal reflux disease)    H/O hiatal hernia    Heart murmur    ASD repair 1999- no meds    HIATAL HERNIA 07/09/2008   Qualifier: Diagnosis of  By: Koleen Distance CMA (AAMA), Leisha     Hx of dysplastic nevus    multiple sites, some severe   Hyperlipidemia    IBS 06/21/2009   Qualifier: Diagnosis of  By: Jarold Motto MD Lang Snow    MENOPAUSAL SYNDROME 01/15/2010   Qualifier: Diagnosis of  By: Dayton Martes MD, Talia     Muscle tightness 09/19/2021   Obesity    Plant allergic contact  dermatitis 10/13/2018   PONV (postoperative nausea and vomiting)    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   Annotation: REPAIRED AS CHILD Qualifier: Diagnosis of  By: Hetty Ely MD, Franne Grip     Social History   Socioeconomic History   Marital status: Married    Spouse name: Not on file   Number of children: 0   Years of education: Not on file   Highest education level: Not on file  Occupational History   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    Employer: [employer]  Tobacco Use   Smoking status: Former    Packs/day: 0.00    Years: 0.00    Additional pack years:  0.00    Total pack years: 0.00    Types: Cigarettes    Quit date: 02/21/1987    Years since quitting: 35.3   Smokeless tobacco: Never   Tobacco comments:    22 years ago-light smoker  Vaping Use   Vaping Use: Never used  Substance and Sexual Activity   Alcohol use: Yes    Alcohol/week: 0.0 standard drinks of alcohol    Comment: occasional - hard apple cider, shot   Drug use: No   Sexual activity: Yes    Partners: Male    Birth control/protection: Surgical    Comment: tubaligation/hysterctomy  Other Topics Concern   Not on file  Social History Narrative   Daily caffeine use   Social Determinants of Health   Financial Resource Strain: Not on file  Food Insecurity: Not on file  Transportation Needs: Not on file  Physical Activity: Not on file  Stress: Not on file  Social Connections: Not on file  Intimate Partner Violence: Not on file    Past Surgical History:  Procedure Laterality Date   ASD REPAIR  1999   [facility]   BREAST CYST ASPIRATION Right 2013   CATARACT EXTRACTION Bilateral    Right done September 2023 and Left January 08 2022   CATARACT EXTRACTION Bilateral 12/21/2021   CLEFT LIP REPAIR     COLONOSCOPY     LAPAROSCOPIC ASSISTED VAGINAL HYSTERECTOMY Bilateral 04/13/2013   Procedure: LAPAROSCOPIC ASSISTED VAGINAL HYSTERECTOMY;  Surgeon: Allie Bossier, MD;  Location: [facility];  Service: Gynecology;  Laterality: Bilateral;- pt states was abdominal hysterectomy    LAPAROSCOPIC TUBAL LIGATION  03/21/2012   Procedure: LAPAROSCOPIC TUBAL LIGATION;  Surgeon: Allie Bossier, MD;  Location: [facility];  Service: Gynecology;  Laterality: Bilateral;   MANDIBLE FRACTURE SURGERY     Plantar Fascitis Repair Bilateral 01/2015    Family History  Problem Relation Age of Onset   Heart disease Mother    Colon polyps Mother    Diabetes Mother    Prostate cancer Father    Heart disease Father    Cancer Father        prostate   Diabetes Maternal Aunt    Diabetes Paternal Grandmother     Fibromyalgia Sister    Colon cancer Neg Hx    Breast cancer Neg Hx    Esophageal cancer Neg Hx    Rectal cancer Neg Hx    Stomach cancer Neg Hx     Allergies  Allergen Reactions   Sertraline Hcl     REACTION: vomiting, severe anxiety    Current Outpatient Medications on File Prior to Visit  Medication Sig Dispense Refill   acetaminophen (TYLENOL) 500 MG tablet Take 500 mg by mouth every 6 (six) hours as needed for mild pain.  B Complex Vitamins (VITAMIN B COMPLEX PO) Take 1 tablet by mouth daily.     busPIRone (BUSPAR) 30 MG tablet TAKE 1 TABLET BY MOUTH EVERY MORNING 1/2 TAB AT LUNCH, AND 1/2 TAB AT BEDTIME FOR ANXIETY. 180 tablet 2   ciclopirox (LOPROX) 0.77 % cream Apply topically 2 (two) times daily. Apply to affected area of left thigh for 2 weeks. 30 g 0   escitalopram (LEXAPRO) 10 MG tablet TAKE 1 TABLET (10 MG TOTAL) BY MOUTH DAILY. FOR ANXIETY. 90 tablet 3   flintstones complete (FLINTSTONES) 60 MG chewable tablet Chew 1 tablet by mouth daily.     ibuprofen (ADVIL) 800 MG tablet Take 1 tablet (800 mg total) by mouth every 8 (eight) hours as needed. 60 tablet 3   ketoconazole (NIZORAL) 2 % cream Apply 1 Application topically as directed. Qd to bid aa rash under breast  until clear, then prn flares 60 g 3   losartan-hydrochlorothiazide (HYZAAR) 100-12.5 MG tablet Take 1 tablet by mouth daily. 90 tablet 0   metFORMIN (GLUCOPHAGE-XR) 500 MG 24 hr tablet TAKE 2 TABLETS (1,000 MG TOTAL) BY MOUTH DAILY WITH BREAKFAST. FOR DIABETES. 180 tablet 1   ofloxacin (OCUFLOX) 0.3 % ophthalmic solution 3 times a day for a week 2 times a day for a week  1 times for 14 days     pantoprazole (PROTONIX) 40 MG tablet TAKE 1 TABLET BY MOUTH EVERY DAY 90 tablet 3   rosuvastatin (CRESTOR) 20 MG tablet Take 1 tablet (20 mg total) by mouth daily. For cholesterol. 90 tablet 3   tirzepatide (MOUNJARO) 5 MG/0.5ML Pen Inject 5 mg into the skin once a week. for diabetes. 6 mL 0   ketorolac (ACULAR) 0.5 %  ophthalmic solution 1 drop. 3 times a day for a week 2 times a day for a week  1 times for 14 days (Patient not taking: Reported on 04/14/2022)     sulfamethoxazole-trimethoprim (BACTRIM DS) 800-160 MG tablet Take 1 tablet by mouth 2 (two) times daily. (Patient not taking: Reported on 04/24/2022) 6 tablet 0   No current facility-administered medications on file prior to visit.    BP 108/64   Pulse 93   Temp (!) 97.5 F (36.4 C) (Temporal)   Ht  (1.626 m)   Wt 154 lb (69.9 kg)   LMP 03/24/2013   SpO2 98%   BMI 26.43 kg/m  Objective:   Physical Exam Cardiovascular:     Rate and Rhythm: Normal rate and regular rhythm.  Pulmonary:     Effort: Pulmonary effort is normal.     Breath sounds: Normal breath sounds.  Abdominal:     Palpations: Abdomen is soft.     Tenderness: There is generalized abdominal tenderness.  Musculoskeletal:     Cervical back: Neck supple.  Skin:    General: Skin is warm and dry.  Neurological:     Mental Status: She is alert.           Assessment & Plan:  Nausea vomiting and diarrhea Assessment & Plan: Resolved.  Likely viral gastritis from food poisoning.  Do suspect that her near syncopal episode was dehydration from a combination of months of little PO intake, and acute diarrhea and vomiting.  Discussed with patient and her husband.  Checking CBC, BMP today. Recommended to continue hydrating and advance diet as tolerated.   Orders: -     CBC with Differential/Platelet -     Basic metabolic panel  Type 2 diabetes mellitus  with hyperglycemia, without long-term current use of insulin Assessment & Plan: Stop Mounjaro 5 mg due to little PO intake over the months.  Continue metformin XR 1000 mg daily.  She will update if food cravings return.  Repeat A1C in May.         Doreene Nest, NP

## 2022-07-04 LAB — CBC WITH DIFFERENTIAL/PLATELET
Absolute Monocytes: 612 cells/uL (ref 200–950)
Basophils Absolute: 31 cells/uL (ref 0–200)
Basophils Relative: 0.6 %
Eosinophils Absolute: 102 cells/uL (ref 15–500)
Eosinophils Relative: 2 %
HCT: 35.6 % (ref 35.0–45.0)
Hemoglobin: 12.2 g/dL (ref 11.7–15.5)
Lymphs Abs: 1081 cells/uL (ref 850–3900)
MCH: 30.5 pg (ref 27.0–33.0)
MCHC: 34.3 g/dL (ref 32.0–36.0)
MCV: 89 fL (ref 80.0–100.0)
MPV: 10.2 fL (ref 7.5–12.5)
Monocytes Relative: 12 %
Neutro Abs: 3274 cells/uL (ref 1500–7800)
Neutrophils Relative %: 64.2 %
Platelets: 221 10*3/uL (ref 140–400)
RBC: 4 10*6/uL (ref 3.80–5.10)
RDW: 12.9 % (ref 11.0–15.0)
Total Lymphocyte: 21.2 %
WBC: 5.1 10*3/uL (ref 3.8–10.8)

## 2022-07-04 LAB — BASIC METABOLIC PANEL
BUN: 14 mg/dL (ref 7–25)
CO2: 23 mmol/L (ref 20–32)
Calcium: 9.5 mg/dL (ref 8.6–10.4)
Chloride: 89 mmol/L — ABNORMAL LOW (ref 98–110)
Creat: 0.82 mg/dL (ref 0.50–1.03)
Glucose, Bld: 109 mg/dL — ABNORMAL HIGH (ref 65–99)
Potassium: 4.1 mmol/L (ref 3.5–5.3)
Sodium: 126 mmol/L — ABNORMAL LOW (ref 135–146)

## 2022-07-22 ENCOUNTER — Ambulatory Visit: Payer: BC Managed Care – PPO | Admitting: Primary Care

## 2022-07-22 ENCOUNTER — Encounter: Payer: Self-pay | Admitting: Primary Care

## 2022-07-22 VITALS — BP 118/74 | HR 67 | Temp 98.8°F | Ht 64.0 in | Wt 160.0 lb

## 2022-07-22 DIAGNOSIS — E1165 Type 2 diabetes mellitus with hyperglycemia: Secondary | ICD-10-CM | POA: Diagnosis not present

## 2022-07-22 DIAGNOSIS — Z7984 Long term (current) use of oral hypoglycemic drugs: Secondary | ICD-10-CM

## 2022-07-22 LAB — BASIC METABOLIC PANEL
BUN: 15 mg/dL (ref 6–23)
CO2: 28 mEq/L (ref 19–32)
Calcium: 9.2 mg/dL (ref 8.4–10.5)
Chloride: 102 mEq/L (ref 96–112)
Creatinine, Ser: 0.77 mg/dL (ref 0.40–1.20)
GFR: 85.11 mL/min (ref >60.00)
Glucose, Bld: 115 mg/dL — ABNORMAL HIGH (ref 70–99)
Potassium: 4.2 mEq/L (ref 3.5–5.1)
Sodium: 136 mEq/L (ref 135–145)

## 2022-07-22 LAB — POCT GLYCOSYLATED HEMOGLOBIN (HGB A1C): Hemoglobin A1C: 6 % — AB (ref 4.0–5.6)

## 2022-07-22 NOTE — Progress Notes (Signed)
Subjective:    Patient ID: Hannah Rocha, female    DOB: 03-09-1966, 57 y.o.   MRN: [id number]  HPI  Hannah Rocha is a very pleasant 57 y.o. female with a history of type 2 diabetes, hypertension, hyperlipidemia, atrial septal defect who presents today for follow up of diabetes.  Current medications include: Metformin XR 1000 mg daily.  Stopped Mounjaro 5 mg dose 2 weeks ago due to rapid weight loss, nausea, near syncope.   She is checking her blood glucose 2 times daily and is getting readings of:  AM fasting: 80's to low 100's Before dinner: low to mid 100's.   Last A1C: 7.5 in February 2024, 6.0 today  Last Eye Exam: Due Last Foot Exam: UTD Pneumonia Vaccination: 2019 Urine Microalbumin: UTD Statin: rosuvastatin   Dietary changes since last visit:Mostly take out or restaurant food. She continues to work on limiting portion sizes.    Exercise: Walking   BP Readings from Last 3 Encounters:  07/22/22 118/74  07/03/22 108/64  06/15/22 139/76      Review of Systems  Eyes:  Negative for visual disturbance.  Gastrointestinal:  Negative for constipation and nausea.  Neurological:  Negative for dizziness and syncope.         Past Medical History:  Diagnosis Date   Allergy    Anemia    hx   Anxiety    CARDIAC MURMUR, HX OF 05/03/2007   Annotation: ASD Qualifier: Diagnosis of  By: Hetty Ely MD, Franne Grip    Cataract    FLOATING CATARACT    De Quervain's tenosynovitis, right 03/29/2019   Diabetes mellitus without complication (HCC)    Dysplastic nevus 01/14/2015   right spinal mid back, severe, excised 03/04/2015   Dysplastic nevus 02/08/2018   right upper back/post shoulder, moderate   Dysplastic nevus 02/08/2018   left spinal mid upper back, severe, excised 03/21/2018   Generalized abdominal pain 09/19/2021   GERD (gastroesophageal reflux disease)    H/O hiatal hernia    Heart murmur    ASD repair 1999- no meds    HIATAL HERNIA  07/09/2008   Qualifier: Diagnosis of  By: Koleen Distance CMA (AAMA), Leisha     Hx of dysplastic nevus    multiple sites, some severe   Hyperlipidemia    IBS 06/21/2009   Qualifier: Diagnosis of  By: Jarold Motto MD Lang Snow    MENOPAUSAL SYNDROME 01/15/2010   Qualifier: Diagnosis of  By: Dayton Martes MD, Talia     Muscle tightness 09/19/2021   Obesity    Plant allergic contact dermatitis 10/13/2018   PONV (postoperative nausea and vomiting)    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   Annotation: REPAIRED AS CHILD Qualifier: Diagnosis of  By: Hetty Ely MD, Franne Grip     Social History   Socioeconomic History   Marital status: Married    Spouse name: Not on file   Number of children: 0   Years of education: Not on file   Highest education level: Not on file  Occupational History   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    Employer: [employer]  Tobacco Use   Smoking status: Former    Packs/day: 0.00    Years: 0.00    Additional pack years: 0.00    Total pack years: 0.00    Types: Cigarettes    Quit date: 02/21/1987    Years since quitting: 35.4   Smokeless tobacco: Never   Tobacco comments:    22 years  ago-light smoker  Vaping Use   Vaping Use: Never used  Substance and Sexual Activity   Alcohol use: Yes    Alcohol/week: 0.0 standard drinks of alcohol    Comment: occasional - hard apple cider, shot   Drug use: No   Sexual activity: Yes    Partners: Male    Birth control/protection: Surgical    Comment: tubaligation/hysterctomy  Other Topics Concern   Not on file  Social History Narrative   Daily caffeine use   Social Determinants of Health   Financial Resource Strain: Not on file  Food Insecurity: Not on file  Transportation Needs: Not on file  Physical Activity: Not on file  Stress: Not on file  Social Connections: Not on file  Intimate Partner Violence: Not on file    Past Surgical History:  Procedure Laterality Date   ASD REPAIR  1999    [facility]   BREAST CYST ASPIRATION Right 2013   CATARACT EXTRACTION Bilateral    Right done September 2023 and Left January 08 2022   CATARACT EXTRACTION Bilateral 12/21/2021   CLEFT LIP REPAIR     COLONOSCOPY     LAPAROSCOPIC ASSISTED VAGINAL HYSTERECTOMY Bilateral 04/13/2013   Procedure: LAPAROSCOPIC ASSISTED VAGINAL HYSTERECTOMY;  Surgeon: Allie Bossier, MD;  Location: [facility];  Service: Gynecology;  Laterality: Bilateral;- pt states was abdominal hysterectomy    LAPAROSCOPIC TUBAL LIGATION  03/21/2012   Procedure: LAPAROSCOPIC TUBAL LIGATION;  Surgeon: Allie Bossier, MD;  Location: [facility];  Service: Gynecology;  Laterality: Bilateral;   MANDIBLE FRACTURE SURGERY     Plantar Fascitis Repair Bilateral 01/2015    Family History  Problem Relation Age of Onset   Heart disease Mother    Colon polyps Mother    Diabetes Mother    Prostate cancer Father    Heart disease Father    Cancer Father        prostate   Diabetes Maternal Aunt    Diabetes Paternal Grandmother    Fibromyalgia Sister    Colon cancer Neg Hx    Breast cancer Neg Hx    Esophageal cancer Neg Hx    Rectal cancer Neg Hx    Stomach cancer Neg Hx     Allergies  Allergen Reactions   Sertraline Hcl     REACTION: vomiting, severe anxiety    Current Outpatient Medications on File Prior to Visit  Medication Sig Dispense Refill   acetaminophen (TYLENOL) 500 MG tablet Take 500 mg by mouth every 6 (six) hours as needed for mild pain.      B Complex Vitamins (VITAMIN B COMPLEX PO) Take 1 tablet by mouth daily.     busPIRone (BUSPAR) 30 MG tablet TAKE 1 TABLET BY MOUTH EVERY MORNING 1/2 TAB AT LUNCH, AND 1/2 TAB AT BEDTIME FOR ANXIETY. 180 tablet 2   ciclopirox (LOPROX) 0.77 % cream Apply topically 2 (two) times daily. Apply to affected area of left thigh for 2 weeks. 30 g 0   escitalopram (LEXAPRO) 10 MG tablet TAKE 1 TABLET (10 MG TOTAL) BY MOUTH DAILY. FOR ANXIETY. 90 tablet 3   flintstones complete (FLINTSTONES) 60 MG chewable  tablet Chew 1 tablet by mouth daily.     ibuprofen (ADVIL) 800 MG tablet Take 1 tablet (800 mg total) by mouth every 8 (eight) hours as needed. 60 tablet 3   ketoconazole (NIZORAL) 2 % cream Apply 1 Application topically as directed. Qd to bid aa rash under breast  until clear, then prn flares 60  g 3   losartan-hydrochlorothiazide (HYZAAR) 100-12.5 MG tablet Take 1 tablet by mouth daily. 90 tablet 0   metFORMIN (GLUCOPHAGE-XR) 500 MG 24 hr tablet TAKE 2 TABLETS (1,000 MG TOTAL) BY MOUTH DAILY WITH BREAKFAST. FOR DIABETES. 180 tablet 1   ofloxacin (OCUFLOX) 0.3 % ophthalmic solution 3 times a day for a week 2 times a day for a week  1 times for 14 days     pantoprazole (PROTONIX) 40 MG tablet TAKE 1 TABLET BY MOUTH EVERY DAY 90 tablet 3   rosuvastatin (CRESTOR) 20 MG tablet Take 1 tablet (20 mg total) by mouth daily. For cholesterol. 90 tablet 3   No current facility-administered medications on file prior to visit.    BP 118/74   Pulse 67   Temp 98.8 F (37.1 C) (Temporal)   Ht 5\' 4"  (1.626 m)   Wt 160 lb (72.6 kg)   LMP 03/24/2013   SpO2 98%   BMI 27.46 kg/m  Objective:   Physical Exam Cardiovascular:     Rate and Rhythm: Normal rate and regular rhythm.  Pulmonary:     Effort: Pulmonary effort is normal.     Breath sounds: Normal breath sounds.  Musculoskeletal:     Cervical back: Neck supple.  Skin:    General: Skin is warm and dry.           Assessment & Plan:  Type 2 diabetes mellitus with hyperglycemia, without long-term current use of insulin (HCC) Assessment & Plan: Improved and controlled with A1C of 6.0!  Remain off Mounjaro given side effects. She will continue to work on weight loss through her diet and portion control.  Encouraged continued exercise.  Continue metformin XR 1000 mg daily. Consider resuming Mounjaro at 2.5 mg weekly if cravings return.   Follow up in 6 months.   Orders: -     POCT glycosylated hemoglobin (Hb A1C) -     Basic metabolic  panel        Doreene Nest, NP

## 2022-07-22 NOTE — Patient Instructions (Addendum)
Continue to work on [employer]. Limit portion sizes.  Continue with regular exercise.   Stop by the lab prior to leaving today. I will notify you of your results once received.   Please schedule a follow up visit for 6 months for a diabetes check.  It was a pleasure to see you today!

## 2022-07-22 NOTE — Assessment & Plan Note (Addendum)
Improved and controlled with A1C of 6.0!  Remain off Mounjaro given side effects. She will continue to work on weight loss through her diet and portion control.  Encouraged continued exercise.  Continue metformin XR 1000 mg daily. Consider resuming Mounjaro at 2.5 mg weekly if cravings return.   Follow up in 6 months.

## 2022-08-15 ENCOUNTER — Other Ambulatory Visit: Payer: Self-pay | Admitting: Primary Care

## 2022-08-15 DIAGNOSIS — E1165 Type 2 diabetes mellitus with hyperglycemia: Secondary | ICD-10-CM

## 2022-09-01 DIAGNOSIS — R600 Localized edema: Secondary | ICD-10-CM | POA: Diagnosis not present

## 2022-09-01 DIAGNOSIS — I872 Venous insufficiency (chronic) (peripheral): Secondary | ICD-10-CM | POA: Diagnosis not present

## 2022-09-01 DIAGNOSIS — L299 Pruritus, unspecified: Secondary | ICD-10-CM | POA: Diagnosis not present

## 2022-09-01 DIAGNOSIS — R252 Cramp and spasm: Secondary | ICD-10-CM | POA: Diagnosis not present

## 2022-09-02 ENCOUNTER — Other Ambulatory Visit: Payer: Self-pay | Admitting: Cardiovascular Disease

## 2022-09-17 NOTE — Progress Notes (Signed)
CARDIOLOGY CONSULT NOTE       Patient ID: Hannah Rocha MRN: [id number] DOB/AGE: [birth year] 57 y.o.  Primary Physician: Doreene Nest, NP Primary Cardiologist: Eden Emms  HPI:  57 y.o. referred by NP Chestine Spore for history of ASD repair and palpitations. First seen by me 06/27/21 She appears to have been followed in past by Dr Hetty Ely Surgery was in ? 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     ROS All other systems reviewed and negative except as noted above  Past Medical History:  Diagnosis Date   Allergy    Anemia    hx   Anxiety    CARDIAC MURMUR, HX OF 05/03/2007   Annotation: ASD Qualifier: Diagnosis of  By: Hetty Ely MD, Franne Grip    Cataract    FLOATING CATARACT    De Quervain's tenosynovitis, right 03/29/2019   Diabetes mellitus without complication (HCC)    Dysplastic nevus 01/14/2015   right spinal mid back, severe, excised 03/04/2015   Dysplastic nevus 02/08/2018   right upper back/post shoulder, moderate   Dysplastic nevus 02/08/2018   left spinal mid upper back, severe, excised 03/21/2018   Generalized abdominal pain 09/19/2021   GERD (gastroesophageal reflux disease)    H/O hiatal hernia    Heart murmur    ASD repair 1999- no meds    HIATAL HERNIA 07/09/2008   Qualifier: Diagnosis of  By: Koleen Distance CMA (AAMA), Leisha     Hx of dysplastic nevus    multiple sites, some severe   Hyperlipidemia    IBS  06/21/2009   Qualifier: Diagnosis of  By: Jarold Motto MD Lang Snow    MENOPAUSAL SYNDROME 01/15/2010   Qualifier: Diagnosis of  By: Dayton Martes MD, Talia     Muscle tightness 09/19/2021   Obesity    Plant allergic contact dermatitis 10/13/2018   PONV (postoperative nausea and vomiting)    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   Annotation: REPAIRED AS CHILD Qualifier: Diagnosis of  By: Hetty Ely MD, Franne Grip     Family History  Problem Relation Age of Onset   Heart disease Mother    Colon polyps Mother    Diabetes Mother    Prostate cancer Father    Heart disease Father    Cancer Father        prostate   Diabetes Maternal Aunt    Diabetes Paternal Grandmother    Fibromyalgia Sister    Colon cancer Neg Hx    Breast cancer Neg Hx    Esophageal cancer Neg Hx    Rectal cancer Neg Hx    Stomach cancer Neg Hx     Social History   Socioeconomic History   Marital status: Married    Spouse name: Not on file   Number of children: 0   Years of education: Not on file   Highest education level: Not  on file  Occupational History   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    Employer: [employer]  Tobacco Use   Smoking status: Former    Packs/day: 0.00    Years: 0.00    Additional pack years: 0.00    Total pack years: 0.00    Types: Cigarettes    Quit date: 02/21/1987    Years since quitting: 35.6   Smokeless tobacco: Never   Tobacco comments:    22 years ago-light smoker  Vaping Use   Vaping Use: Never used  Substance and Sexual Activity   Alcohol use: Yes    Alcohol/week: 0.0 standard drinks of alcohol    Comment: occasional - hard apple cider, shot   Drug use: No   Sexual activity: Yes    Partners: Male    Birth control/protection: Surgical    Comment: tubaligation/hysterctomy  Other Topics Concern   Not on file  Social History Narrative   Daily caffeine use   Social Determinants of Health   Financial Resource Strain: Not on file  Food  Insecurity: Not on file  Transportation Needs: Not on file  Physical Activity: Not on file  Stress: Not on file  Social Connections: Not on file  Intimate Partner Violence: Not on file    Past Surgical History:  Procedure Laterality Date   ASD REPAIR  1999   [facility]   BREAST CYST ASPIRATION Right 2013   CATARACT EXTRACTION Bilateral    Right done September 2023 and Left January 08 2022   CATARACT EXTRACTION Bilateral 12/21/2021   CLEFT LIP REPAIR     COLONOSCOPY     LAPAROSCOPIC ASSISTED VAGINAL HYSTERECTOMY Bilateral 04/13/2013   Procedure: LAPAROSCOPIC ASSISTED VAGINAL HYSTERECTOMY;  Surgeon: Allie Bossier, MD;  Location: [facility];  Service: Gynecology;  Laterality: Bilateral;- pt states was abdominal hysterectomy    LAPAROSCOPIC TUBAL LIGATION  03/21/2012   Procedure: LAPAROSCOPIC TUBAL LIGATION;  Surgeon: Allie Bossier, MD;  Location: [facility];  Service: Gynecology;  Laterality: Bilateral;   MANDIBLE FRACTURE SURGERY     Plantar Fascitis Repair Bilateral 01/2015      Current Outpatient Medications:    acetaminophen (TYLENOL) 500 MG tablet, Take 500 mg by mouth every 6 (six) hours as needed for mild pain. , Disp: , Rfl:    B Complex Vitamins (VITAMIN B COMPLEX PO), Take 1 tablet by mouth daily., Disp: , Rfl:    busPIRone (BUSPAR) 30 MG tablet, TAKE 1 TABLET BY MOUTH EVERY MORNING 1/2 TAB AT LUNCH, AND 1/2 TAB AT BEDTIME FOR ANXIETY., Disp: 180 tablet, Rfl: 2   ciclopirox (LOPROX) 0.77 % cream, Apply topically 2 (two) times daily. Apply to affected area of left thigh for 2 weeks., Disp: 30 g, Rfl: 0   escitalopram (LEXAPRO) 10 MG tablet, TAKE 1 TABLET (10 MG TOTAL) BY MOUTH DAILY. FOR ANXIETY., Disp: 90 tablet, Rfl: 3   flintstones complete (FLINTSTONES) 60 MG chewable tablet, Chew 1 tablet by mouth daily., Disp: , Rfl:    ibuprofen (ADVIL) 800 MG tablet, Take 1 tablet (800 mg total) by mouth every 8 (eight) hours as needed., Disp: 60 tablet, Rfl: 3   losartan-hydrochlorothiazide (HYZAAR)  100-12.5 MG tablet, TAKE 1 TABLET BY MOUTH EVERY DAY, Disp: 90 tablet, Rfl: 0   metFORMIN (GLUCOPHAGE-XR) 500 MG 24 hr tablet, TAKE 2 TABLETS (1,000 MG TOTAL) BY MOUTH DAILY WITH BREAKFAST. FOR DIABETES., Disp: 180 tablet, Rfl: 1   ofloxacin (OCUFLOX) 0.3 % ophthalmic solution, 3 times a day for a week 2  times a day for a week  1 times for 14 days, Disp: , Rfl:    pantoprazole (PROTONIX) 40 MG tablet, TAKE 1 TABLET BY MOUTH EVERY DAY, Disp: 90 tablet, Rfl: 3   rosuvastatin (CRESTOR) 20 MG tablet, Take 1 tablet (20 mg total) by mouth daily. For cholesterol., Disp: 90 tablet, Rfl: 3    Physical Exam: Blood pressure 122/62, pulse 65, height 5\' 4"  (1.626 m), weight 165 lb 12.8 oz (75.2 kg), last menstrual period 03/24/2013, SpO2 97 %.    Affect appropriate Healthy:  appears stated age HEENT: normal Neck supple with no adenopathy JVP normal no bruits no thyromegaly Lungs clear with no wheezing and good diaphragmatic motion Heart:  S1/S2 SEM  murmur, no rub, gallop or click PMI normal Previous sternotomy    Abdomen: benighn, BS positve, no tenderness, no AAA no bruit.  No HSM or HJR Distal pulses intact with no bruits Varicose veins  Neuro non-focal Skin warm and dry No muscular weakness   Labs:   Lab Results  Component Value Date   WBC 5.1 07/03/2022   HGB 12.2 07/03/2022   HCT 35.6 07/03/2022   MCV 89.0 07/03/2022   PLT 221 07/03/2022   No results for input(s): "NA", "K", "CL", "CO2", "BUN", "CREATININE", "CALCIUM", "PROT", "BILITOT", "ALKPHOS", "ALT", "AST", "GLUCOSE" in the last 168 hours.  Invalid input(s): "LABALBU" No results found for: "CKTOTAL", "CKMB", "CKMBINDEX", "TROPONINI"  Lab Results  Component Value Date   CHOL 136 04/24/2022   CHOL 132 02/06/2022   CHOL 142 11/04/2021   Lab Results  Component Value Date   HDL 44.30 04/24/2022   HDL 44 02/06/2022   HDL 42 11/04/2021   Lab Results  Component Value Date   LDLCALC 77 04/24/2022   LDLCALC 72 02/06/2022    LDLCALC 83 11/04/2021   Lab Results  Component Value Date   TRIG 74.0 04/24/2022   TRIG 79 02/06/2022   TRIG 90 11/04/2021   Lab Results  Component Value Date   CHOLHDL 3 04/24/2022   CHOLHDL 3.0 02/06/2022   CHOLHDL 3.4 11/04/2021   Lab Results  Component Value Date   LDLDIRECT 169.0 03/09/2016   LDLDIRECT 87.0 02/27/2014   LDLDIRECT 59.3 12/29/2013      Radiology: No results found.  EKG: SR rate 71 RBBB   ASSESSMENT AND PLAN:   ASD:  history of repair intact with negative bubble and normal RV/LV function by TTE 07/24/21  Palpitations:  at risk for atrial arrhythmias given history of ASD consider monitor in future if patient has more palpitations HTN:   continue Hyzaar   DM:  Discussed low carb diet.  Target hemoglobin A1c is 6.5 or less.  Continue current medications. HLD:  on statin high calcium score for age  Anxiety/Depression:  continue Lexapro  RBBB:  new today discussed with patient Yearly ECG     F/U in a year Needs to see primary to follow DM, HTN, HLD closely   Signed: Charlton Haws 09/28/2022, 8:18 AM

## 2022-09-28 ENCOUNTER — Ambulatory Visit: Payer: BC Managed Care – PPO | Attending: Cardiovascular Disease | Admitting: Cardiovascular Disease

## 2022-09-28 ENCOUNTER — Encounter: Payer: Self-pay | Admitting: Cardiovascular Disease

## 2022-09-28 VITALS — BP 122/62 | HR 65 | Ht 64.0 in | Wt 165.8 lb

## 2022-09-28 DIAGNOSIS — I4510 Unspecified right bundle-branch block: Secondary | ICD-10-CM | POA: Diagnosis not present

## 2022-09-28 DIAGNOSIS — Q211 Atrial septal defect, unspecified: Secondary | ICD-10-CM

## 2022-09-28 DIAGNOSIS — I10 Essential (primary) hypertension: Secondary | ICD-10-CM

## 2022-09-28 DIAGNOSIS — E785 Hyperlipidemia, unspecified: Secondary | ICD-10-CM | POA: Diagnosis not present

## 2022-09-28 NOTE — Patient Instructions (Signed)
Medication Instructions:  Your physician recommends that you continue on your current medications as directed. Please refer to the Current Medication list given to you today.  *If you need a refill on your cardiac medications before your next appointment, please call your pharmacy*  Lab Work: If you have labs (blood work) drawn today and your tests are completely normal, you will receive your results only by: MyChart Message (if you have MyChart) OR A paper copy in the mail If you have any lab test that is abnormal or we need to change your treatment, we will call you to review the results.  Testing/Procedures: None ordered today.  Follow-Up: At [facility], you and your health needs are our priority.  As part of our continuing mission to provide you with exceptional heart care, we have created designated Provider Care Teams.  These Care Teams include your primary Cardiologist (physician) and Advanced Practice Providers (APPs -  Physician Assistants and Nurse Practitioners) who all work together to provide you with the care you need, when you need it.  We recommend signing up for the patient portal called "MyChart".  Sign up information is provided on this After Visit Summary.  MyChart is used to connect with patients for Virtual Visits (Telemedicine).  Patients are able to view lab/test results, encounter notes, upcoming appointments, etc.  Non-urgent messages can be sent to your provider as well.   To learn more about what you can do with MyChart, go to [email or web address].    Your next appointment:   1 year(s)  Provider:   Peter Nishan, MD      

## 2022-10-06 DIAGNOSIS — G5601 Carpal tunnel syndrome, right upper limb: Secondary | ICD-10-CM | POA: Diagnosis not present

## 2022-10-07 DIAGNOSIS — I83892 Varicose veins of left lower extremities with other complications: Secondary | ICD-10-CM | POA: Diagnosis not present

## 2022-10-15 DIAGNOSIS — Z09 Encounter for follow-up examination after completed treatment for conditions other than malignant neoplasm: Secondary | ICD-10-CM | POA: Diagnosis not present

## 2022-10-15 DIAGNOSIS — G5601 Carpal tunnel syndrome, right upper limb: Secondary | ICD-10-CM | POA: Diagnosis not present

## 2022-10-15 DIAGNOSIS — I83892 Varicose veins of left lower extremities with other complications: Secondary | ICD-10-CM | POA: Diagnosis not present

## 2022-10-18 ENCOUNTER — Other Ambulatory Visit: Payer: Self-pay | Admitting: Primary Care

## 2022-10-18 DIAGNOSIS — E119 Type 2 diabetes mellitus without complications: Secondary | ICD-10-CM

## 2022-10-19 ENCOUNTER — Encounter: Payer: Self-pay | Admitting: Primary Care

## 2022-10-19 ENCOUNTER — Ambulatory Visit: Payer: BC Managed Care – PPO | Admitting: Primary Care

## 2022-10-19 VITALS — BP 122/76 | HR 79 | Temp 97.9°F | Ht 64.0 in | Wt 166.0 lb

## 2022-10-19 DIAGNOSIS — Z7984 Long term (current) use of oral hypoglycemic drugs: Secondary | ICD-10-CM | POA: Diagnosis not present

## 2022-10-19 DIAGNOSIS — E1165 Type 2 diabetes mellitus with hyperglycemia: Secondary | ICD-10-CM

## 2022-10-19 LAB — POCT GLYCOSYLATED HEMOGLOBIN (HGB A1C): Hemoglobin A1C: 6.8 % — AB (ref 4.0–5.6)

## 2022-10-19 MED ORDER — RYBELSUS 3 MG PO TABS
3.0000 mg | ORAL_TABLET | Freq: Every day | ORAL | 0 refills | Status: DC
Start: 2022-10-19 — End: 2023-01-22

## 2022-10-19 NOTE — Progress Notes (Signed)
Subjective:    Patient ID: Hannah Rocha, female    DOB: 1965/06/07, 57 y.o.   MRN: [id number]  HPI  Hannah Rocha is a very pleasant 57 y.o. female with a history of hypertension, type 2 diabetes, GAD, hyperlipidemia who presents today for follow-up of diabetes.  Current medications include: Metformin XR 1000 mg daily.  Greggory Keen was discontinued 3 months ago given ongoing symptoms of nausea, GERD, fatigue.   She would like to resume some form of GLP 1 agonist due to weight gain.  She is reticent to resume injectables, has heard about Rybelsus from a friend.  She is checking her blood glucose 2 times daily and is getting readings of:  AM fasting: 120s to 140s 2 hours after dinner: 150s to 170s  Last A1C: 6.0 in May 2024, 6.8 today  Last Eye Exam: Up-to-date Last Foot Exam: Up-to-date Pneumonia Vaccination: 2019 Urine Microalbumin: Up-to-date Statin: Rosuvastatin  Dietary changes since last visit: She has noticed weight gain since coming off Mounjaro. She's had difficulty controlling portion sizes when eating dinner. She's also noticed an increased in sweet cravings.   Exercise: She is walking 15-30 minutes daily.    Wt Readings from Last 3 Encounters:  10/19/22 166 lb (75.3 kg)  09/28/22 165 lb 12.8 oz (75.2 kg)  07/22/22 160 lb (72.6 kg)      Review of Systems  Cardiovascular:  Negative for chest pain.  Gastrointestinal:  Negative for abdominal pain, nausea and vomiting.  Neurological:  Negative for numbness.         Past Medical History:  Diagnosis Date   Allergy    Anemia    hx   Anxiety    CARDIAC MURMUR, HX OF 05/03/2007   Annotation: ASD Qualifier: Diagnosis of  By: Hetty Ely MD, Franne Grip    Cataract    FLOATING CATARACT    De Quervain's tenosynovitis, right 03/29/2019   Diabetes mellitus without complication (HCC)    Dysplastic nevus 01/14/2015   right spinal mid back, severe, excised 03/04/2015   Dysplastic nevus 02/08/2018    right upper back/post shoulder, moderate   Dysplastic nevus 02/08/2018   left spinal mid upper back, severe, excised 03/21/2018   Generalized abdominal pain 09/19/2021   GERD (gastroesophageal reflux disease)    H/O hiatal hernia    Heart murmur    ASD repair 1999- no meds    HIATAL HERNIA 07/09/2008   Qualifier: Diagnosis of  By: Koleen Distance CMA (AAMA), Leisha     Hx of dysplastic nevus    multiple sites, some severe   Hyperlipidemia    IBS 06/21/2009   Qualifier: Diagnosis of  By: Jarold Motto MD Lang Snow    MENOPAUSAL SYNDROME 01/15/2010   Qualifier: Diagnosis of  By: Dayton Martes MD, Talia     Muscle tightness 09/19/2021   Obesity    Plant allergic contact dermatitis 10/13/2018   PONV (postoperative nausea and vomiting)    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   Annotation: REPAIRED AS CHILD Qualifier: Diagnosis of  By: Hetty Ely MD, Franne Grip     Social History   Socioeconomic History   Marital status: Married    Spouse name: Not on file   Number of children: 0   Years of education: Not on file   Highest education level: Not on file  Occupational History   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    Employer: [employer]  Tobacco Use   Smoking status: Former    Current packs/day:  0.00    Types: Cigarettes    Quit date: 02/21/1987    Years since quitting: 35.6   Smokeless tobacco: Never   Tobacco comments:    22 years ago-light smoker  Vaping Use   Vaping status: Never Used  Substance and Sexual Activity   Alcohol use: Yes    Alcohol/week: 0.0 standard drinks of alcohol    Comment: occasional - hard apple cider, shot   Drug use: No   Sexual activity: Yes    Partners: Male    Birth control/protection: Surgical    Comment: tubaligation/hysterctomy  Other Topics Concern   Not on file  Social History Narrative   Daily caffeine use   Social Determinants of Health   Financial Resource Strain: Not on file  Food Insecurity: Not on file   Transportation Needs: Not on file  Physical Activity: Not on file  Stress: Not on file  Social Connections: Not on file  Intimate Partner Violence: Not on file    Past Surgical History:  Procedure Laterality Date   ASD REPAIR  1999   [facility]   BREAST CYST ASPIRATION Right 2013   CATARACT EXTRACTION Bilateral    Right done September 2023 and Left January 08 2022   CATARACT EXTRACTION Bilateral 12/21/2021   CLEFT LIP REPAIR     COLONOSCOPY     LAPAROSCOPIC ASSISTED VAGINAL HYSTERECTOMY Bilateral 04/13/2013   Procedure: LAPAROSCOPIC ASSISTED VAGINAL HYSTERECTOMY;  Surgeon: Allie Bossier, MD;  Location: [facility];  Service: Gynecology;  Laterality: Bilateral;- pt states was abdominal hysterectomy    LAPAROSCOPIC TUBAL LIGATION  03/21/2012   Procedure: LAPAROSCOPIC TUBAL LIGATION;  Surgeon: Allie Bossier, MD;  Location: [facility];  Service: Gynecology;  Laterality: Bilateral;   MANDIBLE FRACTURE SURGERY     Plantar Fascitis Repair Bilateral 01/2015    Family History  Problem Relation Age of Onset   Heart disease Mother    Colon polyps Mother    Diabetes Mother    Prostate cancer Father    Heart disease Father    Cancer Father        prostate   Diabetes Maternal Aunt    Diabetes Paternal Grandmother    Fibromyalgia Sister    Colon cancer Neg Hx    Breast cancer Neg Hx    Esophageal cancer Neg Hx    Rectal cancer Neg Hx    Stomach cancer Neg Hx     Allergies  Allergen Reactions   Sertraline Hcl     REACTION: vomiting, severe anxiety    Current Outpatient Medications on File Prior to Visit  Medication Sig Dispense Refill   acetaminophen (TYLENOL) 500 MG tablet Take 500 mg by mouth every 6 (six) hours as needed for mild pain.      B Complex Vitamins (VITAMIN B COMPLEX PO) Take 1 tablet by mouth daily.     busPIRone (BUSPAR) 30 MG tablet TAKE 1 TABLET BY MOUTH EVERY MORNING 1/2 TAB AT LUNCH, AND 1/2 TAB AT BEDTIME FOR ANXIETY. 180 tablet 2   escitalopram (LEXAPRO) 10 MG tablet TAKE  1 TABLET (10 MG TOTAL) BY MOUTH DAILY. FOR ANXIETY. 90 tablet 3   flintstones complete (FLINTSTONES) 60 MG chewable tablet Chew 1 tablet by mouth daily.     ibuprofen (ADVIL) 800 MG tablet Take 1 tablet (800 mg total) by mouth every 8 (eight) hours as needed. 60 tablet 3   losartan-hydrochlorothiazide (HYZAAR) 100-12.5 MG tablet TAKE 1 TABLET BY MOUTH EVERY DAY 90 tablet 0   metFORMIN (  GLUCOPHAGE-XR) 500 MG 24 hr tablet TAKE 2 TABLETS (1,000 MG TOTAL) BY MOUTH DAILY WITH BREAKFAST. FOR DIABETES. 180 tablet 1   ofloxacin (OCUFLOX) 0.3 % ophthalmic solution 3 times a day for a week 2 times a day for a week  1 times for 14 days     pantoprazole (PROTONIX) 40 MG tablet TAKE 1 TABLET BY MOUTH EVERY DAY 90 tablet 3   rosuvastatin (CRESTOR) 20 MG tablet Take 1 tablet (20 mg total) by mouth daily. For cholesterol. 90 tablet 3   ciclopirox (LOPROX) 0.77 % cream Apply topically 2 (two) times daily. Apply to affected area of left thigh for 2 weeks. (Patient not taking: Reported on 10/19/2022) 30 g 0   No current facility-administered medications on file prior to visit.    BP 122/76   Pulse 79   Temp 97.9 F (36.6 C) (Temporal)   Ht 5\' 4"  (1.626 m)   Wt 166 lb (75.3 kg)   LMP 03/24/2013   SpO2 96%   BMI 28.49 kg/m  Objective:   Physical Exam Cardiovascular:     Rate and Rhythm: Normal rate and regular rhythm.  Pulmonary:     Effort: Pulmonary effort is normal.     Breath sounds: Normal breath sounds.  Musculoskeletal:     Cervical back: Neck supple.  Skin:    General: Skin is warm and dry.           Assessment & Plan:  Type 2 diabetes mellitus with hyperglycemia, without long-term current use of insulin (HCC) Assessment & Plan: Slightly worse with A1C of 6.8 today. Likely due to weight gain.  Continue metformin XR 1000 mg daily. Add Rybelsus 3 mg daily x 30 days, increase to 7 mg daily thereafter.  Discussed to work on her diet, reducing portion sizes. Increasing physical  activity.  Follow-up in 3 months.  She will update sooner if she experiences intolerable side effects.  Orders: -     POCT glycosylated hemoglobin (Hb A1C) -     Rybelsus; Take 1 tablet (3 mg total) by mouth daily. for diabetes.  Dispense: 30 tablet; Refill: 0        Doreene Nest, NP

## 2022-10-19 NOTE — Assessment & Plan Note (Signed)
Slightly worse with A1C of 6.8 today. Likely due to weight gain.  Continue metformin XR 1000 mg daily. Add Rybelsus 3 mg daily x 30 days, increase to 7 mg daily thereafter.  Discussed to work on her diet, reducing portion sizes. Increasing physical activity.  Follow-up in 3 months.  She will update sooner if she experiences intolerable side effects.

## 2022-10-19 NOTE — Patient Instructions (Signed)
Start Rybelsus 3 mg daily for diabetes.  Notify me when you have 1 week remaining and I will send the 7 mg dose to your pharmacy.  Continue metformin.  Continue to work on your diet, increase physical exercise.  Please schedule a follow up visit for 3 months.  It was a pleasure to see you today!

## 2022-10-27 ENCOUNTER — Ambulatory Visit: Payer: BC Managed Care – PPO | Admitting: Primary Care

## 2022-11-03 DIAGNOSIS — G5601 Carpal tunnel syndrome, right upper limb: Secondary | ICD-10-CM | POA: Diagnosis not present

## 2022-11-04 ENCOUNTER — Encounter: Payer: Self-pay | Admitting: Family Medicine

## 2022-11-04 ENCOUNTER — Ambulatory Visit (INDEPENDENT_AMBULATORY_CARE_PROVIDER_SITE_OTHER): Payer: BC Managed Care – PPO | Admitting: Family Medicine

## 2022-11-04 VITALS — BP 168/84 | HR 73 | Wt 170.0 lb

## 2022-11-04 DIAGNOSIS — Z01419 Encounter for gynecological examination (general) (routine) without abnormal findings: Secondary | ICD-10-CM

## 2022-11-04 DIAGNOSIS — Z1231 Encounter for screening mammogram for malignant neoplasm of breast: Secondary | ICD-10-CM | POA: Diagnosis not present

## 2022-11-04 NOTE — Progress Notes (Signed)
Subjective:     Hannah Rocha is a 57 y.o. female and is here for a comprehensive physical exam. The patient reports no problems.  The following portions of the patient's history were reviewed and updated as appropriate: allergies, current medications, past family history, past medical history, past social history, past surgical history, and problem list.  Review of Systems Pertinent items noted in HPI and remainder of comprehensive ROS otherwise negative.   Objective:  Chaperone present for exam   BP (!) 147/80   Pulse 85   Wt 170 lb (77.1 kg)   LMP 03/24/2013   BMI 29.18 kg/m  General appearance: alert, cooperative, and appears stated age Head: Normocephalic, without obvious abnormality, atraumatic Neck: no adenopathy, supple, symmetrical, trachea midline, and thyroid not enlarged, symmetric, no tenderness/mass/nodules Lungs: clear to auscultation bilaterally Breasts: normal appearance, no masses or tenderness Heart: regular rate and rhythm, S1, S2 normal, no murmur, click, rub or gallop Abdomen: soft, non-tender; bowel sounds normal; no masses,  no organomegaly Pelvic: BUS normal, vagina is pink and rugated, cervix and uterus are surgically absent, no adnexal mass or tenderness. Extremities: extremities normal, atraumatic, no cyanosis or edema Pulses: 2+ and symmetric Skin: Skin color, texture, turgor normal. No rashes or lesions Lymph nodes: Cervical, supraclavicular, and axillary nodes normal. Neurologic: Grossly normal    Assessment:    GYN female exam.      Plan:  Encounter for gynecological examination without abnormal finding  Encounter for screening mammogram for malignant neoplasm of breast - Plan: MM 3D SCREENING MAMMOGRAM BILATERAL BREAST  Return in 1 year (on 11/04/2023).     See After Visit Summary for Counseling Recommendations

## 2022-11-04 NOTE — Progress Notes (Signed)
Patient presents for Annual.  *Pt notes having white coat syndrome.  Last pap:  Vaginal HYST  Mammogram:  10/24/21  Due now pt would like in [place] area. STD Screening: Declines Flu Vaccine : N/A  CC: Annual/None  Fun Fact: pt likes to shop, craft, funny loves to eat. Pt loves being around positive loves going to church.

## 2022-11-14 ENCOUNTER — Other Ambulatory Visit: Payer: Self-pay | Admitting: Cardiovascular Disease

## 2022-11-14 DIAGNOSIS — E785 Hyperlipidemia, unspecified: Secondary | ICD-10-CM

## 2022-11-23 ENCOUNTER — Telehealth: Payer: Self-pay | Admitting: *Deleted

## 2022-11-23 NOTE — Telephone Encounter (Signed)
Yesi,   This pt is a documented difficult intubation and her procedure will need to be done at the hospital.   Thanks,  John Nulty 

## 2022-11-24 NOTE — Telephone Encounter (Signed)
Hannah Rocha,   See attached messages. Pt is aware she will receive a call once an apt slot is available. PV and colon have been cancelled.   Thank you, Pre visit

## 2022-11-24 NOTE — Telephone Encounter (Signed)
OK for direct schedule colonoscopy at hospital.

## 2022-11-24 NOTE — Telephone Encounter (Signed)
Dr. Russella Dar,   Please see attached message from Jonny Ruiz. Would you like an OV or direct to the hospital.   Thank you, Pre visit

## 2022-11-26 ENCOUNTER — Other Ambulatory Visit: Payer: Self-pay

## 2022-11-26 DIAGNOSIS — Z86010 Personal history of colonic polyps: Secondary | ICD-10-CM

## 2022-11-26 DIAGNOSIS — Z1211 Encounter for screening for malignant neoplasm of colon: Secondary | ICD-10-CM

## 2022-11-26 MED ORDER — NA SULFATE-K SULFATE-MG SULF 17.5-3.13-1.6 GM/177ML PO SOLN: 1.0000 | Freq: Once | ORAL | 0 refills | Status: AC

## 2022-11-26 NOTE — Telephone Encounter (Signed)
12/31/22 colon set up for 8 am at [facility] with Dr Russella Dar.  Left message on machine to call back

## 2022-11-27 DIAGNOSIS — G5601 Carpal tunnel syndrome, right upper limb: Secondary | ICD-10-CM | POA: Diagnosis not present

## 2022-11-27 HISTORY — PX: OTHER SURGICAL HISTORY: SHX169

## 2022-11-27 NOTE — Telephone Encounter (Signed)
Colon scheduled, pt instructed and medications reviewed.  Patient instructions mailed to home.  Patient to call with any questions or concerns.  

## 2022-11-27 NOTE — Telephone Encounter (Signed)
Left message on machine to call back  

## 2022-12-01 ENCOUNTER — Ambulatory Visit
Admission: RE | Admit: 2022-12-01 | Discharge: 2022-12-01 | Disposition: A | Discharge status: Home / Self Care | Payer: BC Managed Care – PPO | Source: Ambulatory Visit | Attending: Family Medicine | Admitting: Family Medicine

## 2022-12-01 DIAGNOSIS — Z1231 Encounter for screening mammogram for malignant neoplasm of breast: Secondary | ICD-10-CM | POA: Insufficient documentation

## 2022-12-04 ENCOUNTER — Other Ambulatory Visit: Payer: Self-pay | Admitting: Cardiovascular Disease

## 2022-12-14 ENCOUNTER — Encounter: Payer: Self-pay | Admitting: Family Medicine

## 2022-12-14 ENCOUNTER — Ambulatory Visit: Payer: BC Managed Care – PPO | Admitting: Family Medicine

## 2022-12-14 VITALS — BP 122/70 | HR 76 | Temp 97.6°F | Ht 64.0 in | Wt 165.1 lb

## 2022-12-14 DIAGNOSIS — Z23 Encounter for immunization: Secondary | ICD-10-CM

## 2022-12-14 DIAGNOSIS — H6991 Unspecified Eustachian tube disorder, right ear: Secondary | ICD-10-CM | POA: Diagnosis not present

## 2022-12-14 DIAGNOSIS — J069 Acute upper respiratory infection, unspecified: Secondary | ICD-10-CM | POA: Diagnosis not present

## 2022-12-14 LAB — POC COVID19 BINAXNOW: SARS Coronavirus 2 Ag: NEGATIVE

## 2022-12-14 MED ORDER — FLUTICASONE PROPIONATE 50 MCG/ACT NA SUSP: 2.0000 | Freq: Every day | NASAL | 6 refills | Status: AC

## 2022-12-14 NOTE — Assessment & Plan Note (Addendum)
Right side Associated with sinus congestion and uri symptoms   Recommend use of steroid ns -sent in fluticasone  Bid for 3 d then daily  Update if not starting to improve in a week or if worsening  Call back and Er precautions noted in detail today

## 2022-12-14 NOTE — Progress Notes (Signed)
Subjective:    Patient ID: Hannah Rocha, female    DOB: 25-Dec-1965, 57 y.o.   MRN: [id number]  HPI  Wt Readings from Last 3 Encounters:  12/14/22 165 lb 2 oz (74.9 kg)  11/04/22 170 lb (77.1 kg)  10/19/22 166 lb (75.3 kg)   28.34 kg/m  Vitals:   12/14/22 0836 12/14/22 0858  BP: (!) 144/76 122/70  Pulse: 76   Temp: 97.6 F (36.4 C)   SpO2: 98%     57 yo pt of NP Clark presents for ear fullness  Left ear is painful - 1-2 weeks  ? If wax in it  Some congestion as well   Started sat am   Some nasal congestion  Cheeks are painful  Mild cough  No fever   Tired   Has not done a covid test    Patient Active Problem List   Diagnosis Date Noted   Nausea vomiting and diarrhea 07/03/2022   History of atrial septal defect repair 06/25/2021   Fluttering sensation of heart 06/25/2021   Encounter for annual general medical examination with abnormal findings in adult 04/23/2021   Carpal tunnel syndrome 03/29/2019   HLD (hyperlipidemia) 03/09/2016   Type 2 diabetes mellitus with hyperglycemia (HCC) 03/09/2016   Vitamin D deficiency 03/09/2016   ETD (eustachian tube dysfunction) 05/15/2015   Varicose veins 12/28/2013   Viral URI with cough 11/30/2012   Essential hypertension 04/04/2012   GAD (generalized anxiety disorder) 04/04/2012   GERD (gastroesophageal reflux disease) 01/23/2011   ANEMIA-IRON DEFICIENCY 06/21/2009   IRON DEFICIENCY ANEMIA, HX OF 07/10/2008   ATRIAL SEPTAL DEFECT, SECUNDUM TYPE 05/03/2007   Past Medical History:  Diagnosis Date   Allergy    Anemia    hx   Anxiety    CARDIAC MURMUR, HX OF 05/03/2007   Annotation: ASD Qualifier: Diagnosis of  By: Hetty Ely MD, Franne Grip    Cataract    FLOATING CATARACT    De Quervain's tenosynovitis, right 03/29/2019   Diabetes mellitus without complication (HCC)    Dysplastic nevus 01/14/2015   right spinal mid back, severe, excised 03/04/2015   Dysplastic nevus 02/08/2018   right upper  back/post shoulder, moderate   Dysplastic nevus 02/08/2018   left spinal mid upper back, severe, excised 03/21/2018   Generalized abdominal pain 09/19/2021   GERD (gastroesophageal reflux disease)    H/O hiatal hernia    Heart murmur    ASD repair 1999- no meds    HIATAL HERNIA 07/09/2008   Qualifier: Diagnosis of  By: Koleen Distance CMA (AAMA), Leisha     Hx of dysplastic nevus    multiple sites, some severe   Hyperlipidemia    IBS 06/21/2009   Qualifier: Diagnosis of  By: Jarold Motto MD Lang Snow    MENOPAUSAL SYNDROME 01/15/2010   Qualifier: Diagnosis of  By: Dayton Martes MD, Talia     Muscle tightness 09/19/2021   Obesity    Plant allergic contact dermatitis 10/13/2018   PONV (postoperative nausea and vomiting)    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   Annotation: REPAIRED AS CHILD Qualifier: Diagnosis of  By: Hetty Ely MD, Franne Grip    Past Surgical History:  Procedure Laterality Date   ASD REPAIR  1999   [facility]   BREAST CYST ASPIRATION Right 2013   CATARACT EXTRACTION Bilateral    Right done September 2023 and Left January 08 2022   CATARACT EXTRACTION Bilateral 12/21/2021   CLEFT LIP REPAIR     COLONOSCOPY  LAPAROSCOPIC ASSISTED VAGINAL HYSTERECTOMY Bilateral 04/13/2013   Procedure: LAPAROSCOPIC ASSISTED VAGINAL HYSTERECTOMY;  Surgeon: Allie Bossier, MD;  Location: [facility];  Service: Gynecology;  Laterality: Bilateral;- pt states was abdominal hysterectomy    LAPAROSCOPIC TUBAL LIGATION  03/21/2012   Procedure: LAPAROSCOPIC TUBAL LIGATION;  Surgeon: Allie Bossier, MD;  Location: [facility];  Service: Gynecology;  Laterality: Bilateral;   MANDIBLE FRACTURE SURGERY     Plantar Fascitis Repair Bilateral 01/2015   Social History   Tobacco Use   Smoking status: Former    Current packs/day: 0.00    Types: Cigarettes    Quit date: 02/21/1987    Years since quitting: 35.8   Smokeless tobacco: Never   Tobacco comments:    22 years ago-light smoker  Vaping Use    Vaping status: Never Used  Substance Use Topics   Alcohol use: Yes    Alcohol/week: 0.0 standard drinks of alcohol    Comment: occasional - hard apple cider, shot   Drug use: No   Family History  Problem Relation Age of Onset   Heart disease Mother    Colon polyps Mother    Diabetes Mother    Prostate cancer Father    Heart disease Father    Cancer Father        prostate   Diabetes Maternal Aunt    Diabetes Paternal Grandmother    Fibromyalgia Sister    Colon cancer Neg Hx    Breast cancer Neg Hx    Esophageal cancer Neg Hx    Rectal cancer Neg Hx    Stomach cancer Neg Hx    Allergies  Allergen Reactions   Sertraline Hcl     REACTION: vomiting, severe anxiety   Current Outpatient Medications on File Prior to Visit  Medication Sig Dispense Refill   acetaminophen (TYLENOL) 500 MG tablet Take 500 mg by mouth every 6 (six) hours as needed for mild pain.      B Complex Vitamins (VITAMIN B COMPLEX PO) Take 1 tablet by mouth daily.     busPIRone (BUSPAR) 30 MG tablet TAKE 1 TABLET BY MOUTH EVERY MORNING 1/2 TAB AT LUNCH, AND 1/2 TAB AT BEDTIME FOR ANXIETY. 180 tablet 2   escitalopram (LEXAPRO) 10 MG tablet TAKE 1 TABLET (10 MG TOTAL) BY MOUTH DAILY. FOR ANXIETY. 90 tablet 3   flintstones complete (FLINTSTONES) 60 MG chewable tablet Chew 1 tablet by mouth daily.     ibuprofen (ADVIL) 800 MG tablet Take 1 tablet (800 mg total) by mouth every 8 (eight) hours as needed. 60 tablet 3   losartan-hydrochlorothiazide (HYZAAR) 100-12.5 MG tablet TAKE 1 TABLET BY MOUTH EVERY DAY 90 tablet 3   metFORMIN (GLUCOPHAGE-XR) 500 MG 24 hr tablet TAKE 2 TABLETS (1,000 MG TOTAL) BY MOUTH DAILY WITH BREAKFAST. FOR DIABETES. 180 tablet 1   pantoprazole (PROTONIX) 40 MG tablet TAKE 1 TABLET BY MOUTH EVERY DAY 90 tablet 3   rosuvastatin (CRESTOR) 20 MG tablet TAKE 1 TABLET BY MOUTH DAILY. FOR CHOLESTEROL 90 tablet 3   Semaglutide (RYBELSUS) 3 MG TABS Take 1 tablet (3 mg total) by mouth daily. for diabetes.  30 tablet 0   No current facility-administered medications on file prior to visit.    Review of Systems  Constitutional:  Positive for appetite change and fatigue. Negative for fever.  HENT:  Positive for congestion, ear pain, postnasal drip, rhinorrhea, sinus pressure, sneezing and sore throat. Negative for ear discharge, hearing loss and sinus pain.   Eyes:  Negative  for pain and discharge.  Respiratory:  Positive for cough. Negative for shortness of breath, wheezing and stridor.   Cardiovascular:  Negative for chest pain.  Gastrointestinal:  Negative for diarrhea, nausea and vomiting.  Genitourinary:  Negative for frequency, hematuria and urgency.  Musculoskeletal:  Negative for arthralgias and myalgias.  Skin:  Negative for rash.  Neurological:  Positive for headaches. Negative for dizziness, weakness and light-headedness.  Psychiatric/Behavioral:  Negative for confusion and dysphoric mood.        Objective:   Physical Exam Constitutional:      General: She is not in acute distress.    Appearance: Normal appearance. She is well-developed and normal weight. She is not ill-appearing, toxic-appearing or diaphoretic.  HENT:     Head: Normocephalic and atraumatic.     Comments: Nares are injected and congested    Very mild right maxillary sinus tenderness     Right Ear: Ear canal and external ear normal. There is no impacted cerumen.     Left Ear: Tympanic membrane, ear canal and external ear normal. There is no impacted cerumen.     Ears:     Comments: Right TM is dull / small effusion   Left TM is clear      Nose: Congestion and rhinorrhea present.     Mouth/Throat:     Mouth: Mucous membranes are moist.     Pharynx: Oropharynx is clear. No oropharyngeal exudate or posterior oropharyngeal erythema.     Comments: Clear pnd   Small aphthous ulcer in right posterior throat  Eyes:     General:        Right eye: No discharge.        Left eye: No discharge.      Conjunctiva/sclera: Conjunctivae normal.     Pupils: Pupils are equal, round, and reactive to light.  Cardiovascular:     Rate and Rhythm: Normal rate.     Heart sounds: Normal heart sounds.  Pulmonary:     Effort: Pulmonary effort is normal. No respiratory distress.     Breath sounds: Normal breath sounds. No stridor. No wheezing, rhonchi or rales.  Chest:     Chest wall: No tenderness.  Musculoskeletal:     Cervical back: Normal range of motion and neck supple.  Lymphadenopathy:     Cervical: No cervical adenopathy.  Skin:    General: Skin is warm and dry.     Capillary Refill: Capillary refill takes less than 2 seconds.     Findings: No rash.  Neurological:     Mental Status: She is alert.     Cranial Nerves: No cranial nerve deficit.  Psychiatric:        Mood and Affect: Mood normal.           Assessment & Plan:   Problem List Items Addressed This Visit       Respiratory   Viral URI with cough - Primary    Symptoms started Saturday -mild  No fever  Congestion / very mild cough and ETD right ear  Some sinus pressure-will watch for signs and symptoms of sinus or ear bact infection   Covid result   Discussed symptom control-see AVS Update if not starting to improve in a week or if worsening  Call back and Er precautions noted in detail today        Relevant Orders   POC COVID-19 BinaxNow (Completed)     Nervous and Auditory   ETD (eustachian tube dysfunction)  Right side Associated with sinus congestion and uri symptoms   Recommend use of steroid ns  Update if not starting to improve in a week or if worsening  Call back and Er precautions noted in detail today

## 2022-12-14 NOTE — Assessment & Plan Note (Signed)
Symptoms started Saturday -mild  No fever  Congestion / very mild cough and ETD right ear  Some sinus pressure-will watch for signs and symptoms of sinus or ear bact infection   Covid result   Discussed symptom control-see AVS Update if not starting to improve in a week or if worsening  Call back and Er precautions noted in detail today

## 2022-12-14 NOTE — H&P (View-Only) (Signed)
Subjective:    Patient ID: Hannah Rocha, female    DOB: 25-Dec-1965, 57 y.o.   MRN: [id number]  HPI  Wt Readings from Last 3 Encounters:  12/14/22 165 lb 2 oz (74.9 kg)  11/04/22 170 lb (77.1 kg)  10/19/22 166 lb (75.3 kg)   28.34 kg/m  Vitals:   12/14/22 0836 12/14/22 0858  BP: (!) 144/76 122/70  Pulse: 76   Temp: 97.6 F (36.4 C)   SpO2: 98%     57 yo pt of NP Clark presents for ear fullness  Left ear is painful - 1-2 weeks  ? If wax in it  Some congestion as well   Started sat am   Some nasal congestion  Cheeks are painful  Mild cough  No fever   Tired   Has not done a covid test    Patient Active Problem List   Diagnosis Date Noted   Nausea vomiting and diarrhea 07/03/2022   History of atrial septal defect repair 06/25/2021   Fluttering sensation of heart 06/25/2021   Encounter for annual general medical examination with abnormal findings in adult 04/23/2021   Carpal tunnel syndrome 03/29/2019   HLD (hyperlipidemia) 03/09/2016   Type 2 diabetes mellitus with hyperglycemia (HCC) 03/09/2016   Vitamin D deficiency 03/09/2016   ETD (eustachian tube dysfunction) 05/15/2015   Varicose veins 12/28/2013   Viral URI with cough 11/30/2012   Essential hypertension 04/04/2012   GAD (generalized anxiety disorder) 04/04/2012   GERD (gastroesophageal reflux disease) 01/23/2011   ANEMIA-IRON DEFICIENCY 06/21/2009   IRON DEFICIENCY ANEMIA, HX OF 07/10/2008   ATRIAL SEPTAL DEFECT, SECUNDUM TYPE 05/03/2007   Past Medical History:  Diagnosis Date   Allergy    Anemia    hx   Anxiety    CARDIAC MURMUR, HX OF 05/03/2007   Annotation: ASD Qualifier: Diagnosis of  By: Hetty Ely MD, Franne Grip    Cataract    FLOATING CATARACT    De Quervain's tenosynovitis, right 03/29/2019   Diabetes mellitus without complication (HCC)    Dysplastic nevus 01/14/2015   right spinal mid back, severe, excised 03/04/2015   Dysplastic nevus 02/08/2018   right upper  back/post shoulder, moderate   Dysplastic nevus 02/08/2018   left spinal mid upper back, severe, excised 03/21/2018   Generalized abdominal pain 09/19/2021   GERD (gastroesophageal reflux disease)    H/O hiatal hernia    Heart murmur    ASD repair 1999- no meds    HIATAL HERNIA 07/09/2008   Qualifier: Diagnosis of  By: Koleen Distance CMA (AAMA), Leisha     Hx of dysplastic nevus    multiple sites, some severe   Hyperlipidemia    IBS 06/21/2009   Qualifier: Diagnosis of  By: Jarold Motto MD Lang Snow    MENOPAUSAL SYNDROME 01/15/2010   Qualifier: Diagnosis of  By: Dayton Martes MD, Talia     Muscle tightness 09/19/2021   Obesity    Plant allergic contact dermatitis 10/13/2018   PONV (postoperative nausea and vomiting)    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   Annotation: REPAIRED AS CHILD Qualifier: Diagnosis of  By: Hetty Ely MD, Franne Grip    Past Surgical History:  Procedure Laterality Date   ASD REPAIR  1999   [facility]   BREAST CYST ASPIRATION Right 2013   CATARACT EXTRACTION Bilateral    Right done September 2023 and Left January 08 2022   CATARACT EXTRACTION Bilateral 12/21/2021   CLEFT LIP REPAIR     COLONOSCOPY  LAPAROSCOPIC ASSISTED VAGINAL HYSTERECTOMY Bilateral 04/13/2013   Procedure: LAPAROSCOPIC ASSISTED VAGINAL HYSTERECTOMY;  Surgeon: Allie Bossier, MD;  Location: [facility];  Service: Gynecology;  Laterality: Bilateral;- pt states was abdominal hysterectomy    LAPAROSCOPIC TUBAL LIGATION  03/21/2012   Procedure: LAPAROSCOPIC TUBAL LIGATION;  Surgeon: Allie Bossier, MD;  Location: [facility];  Service: Gynecology;  Laterality: Bilateral;   MANDIBLE FRACTURE SURGERY     Plantar Fascitis Repair Bilateral 01/2015   Social History   Tobacco Use   Smoking status: Former    Current packs/day: 0.00    Types: Cigarettes    Quit date: 02/21/1987    Years since quitting: 35.8   Smokeless tobacco: Never   Tobacco comments:    22 years ago-light smoker  Vaping Use    Vaping status: Never Used  Substance Use Topics   Alcohol use: Yes    Alcohol/week: 0.0 standard drinks of alcohol    Comment: occasional - hard apple cider, shot   Drug use: No   Family History  Problem Relation Age of Onset   Heart disease Mother    Colon polyps Mother    Diabetes Mother    Prostate cancer Father    Heart disease Father    Cancer Father        prostate   Diabetes Maternal Aunt    Diabetes Paternal Grandmother    Fibromyalgia Sister    Colon cancer Neg Hx    Breast cancer Neg Hx    Esophageal cancer Neg Hx    Rectal cancer Neg Hx    Stomach cancer Neg Hx    Allergies  Allergen Reactions   Sertraline Hcl     REACTION: vomiting, severe anxiety   Current Outpatient Medications on File Prior to Visit  Medication Sig Dispense Refill   acetaminophen (TYLENOL) 500 MG tablet Take 500 mg by mouth every 6 (six) hours as needed for mild pain.      B Complex Vitamins (VITAMIN B COMPLEX PO) Take 1 tablet by mouth daily.     busPIRone (BUSPAR) 30 MG tablet TAKE 1 TABLET BY MOUTH EVERY MORNING 1/2 TAB AT LUNCH, AND 1/2 TAB AT BEDTIME FOR ANXIETY. 180 tablet 2   escitalopram (LEXAPRO) 10 MG tablet TAKE 1 TABLET (10 MG TOTAL) BY MOUTH DAILY. FOR ANXIETY. 90 tablet 3   flintstones complete (FLINTSTONES) 60 MG chewable tablet Chew 1 tablet by mouth daily.     ibuprofen (ADVIL) 800 MG tablet Take 1 tablet (800 mg total) by mouth every 8 (eight) hours as needed. 60 tablet 3   losartan-hydrochlorothiazide (HYZAAR) 100-12.5 MG tablet TAKE 1 TABLET BY MOUTH EVERY DAY 90 tablet 3   metFORMIN (GLUCOPHAGE-XR) 500 MG 24 hr tablet TAKE 2 TABLETS (1,000 MG TOTAL) BY MOUTH DAILY WITH BREAKFAST. FOR DIABETES. 180 tablet 1   pantoprazole (PROTONIX) 40 MG tablet TAKE 1 TABLET BY MOUTH EVERY DAY 90 tablet 3   rosuvastatin (CRESTOR) 20 MG tablet TAKE 1 TABLET BY MOUTH DAILY. FOR CHOLESTEROL 90 tablet 3   Semaglutide (RYBELSUS) 3 MG TABS Take 1 tablet (3 mg total) by mouth daily. for diabetes.  30 tablet 0   No current facility-administered medications on file prior to visit.    Review of Systems  Constitutional:  Positive for appetite change and fatigue. Negative for fever.  HENT:  Positive for congestion, ear pain, postnasal drip, rhinorrhea, sinus pressure, sneezing and sore throat. Negative for ear discharge, hearing loss and sinus pain.   Eyes:  Negative  for pain and discharge.  Respiratory:  Positive for cough. Negative for shortness of breath, wheezing and stridor.   Cardiovascular:  Negative for chest pain.  Gastrointestinal:  Negative for diarrhea, nausea and vomiting.  Genitourinary:  Negative for frequency, hematuria and urgency.  Musculoskeletal:  Negative for arthralgias and myalgias.  Skin:  Negative for rash.  Neurological:  Positive for headaches. Negative for dizziness, weakness and light-headedness.  Psychiatric/Behavioral:  Negative for confusion and dysphoric mood.        Objective:   Physical Exam Constitutional:      General: She is not in acute distress.    Appearance: Normal appearance. She is well-developed and normal weight. She is not ill-appearing, toxic-appearing or diaphoretic.  HENT:     Head: Normocephalic and atraumatic.     Comments: Nares are injected and congested    Very mild right maxillary sinus tenderness     Right Ear: Ear canal and external ear normal. There is no impacted cerumen.     Left Ear: Tympanic membrane, ear canal and external ear normal. There is no impacted cerumen.     Ears:     Comments: Right TM is dull / small effusion   Left TM is clear      Nose: Congestion and rhinorrhea present.     Mouth/Throat:     Mouth: Mucous membranes are moist.     Pharynx: Oropharynx is clear. No oropharyngeal exudate or posterior oropharyngeal erythema.     Comments: Clear pnd   Small aphthous ulcer in right posterior throat  Eyes:     General:        Right eye: No discharge.        Left eye: No discharge.      Conjunctiva/sclera: Conjunctivae normal.     Pupils: Pupils are equal, round, and reactive to light.  Cardiovascular:     Rate and Rhythm: Normal rate.     Heart sounds: Normal heart sounds.  Pulmonary:     Effort: Pulmonary effort is normal. No respiratory distress.     Breath sounds: Normal breath sounds. No stridor. No wheezing, rhonchi or rales.  Chest:     Chest wall: No tenderness.  Musculoskeletal:     Cervical back: Normal range of motion and neck supple.  Lymphadenopathy:     Cervical: No cervical adenopathy.  Skin:    General: Skin is warm and dry.     Capillary Refill: Capillary refill takes less than 2 seconds.     Findings: No rash.  Neurological:     Mental Status: She is alert.     Cranial Nerves: No cranial nerve deficit.  Psychiatric:        Mood and Affect: Mood normal.           Assessment & Plan:   Problem List Items Addressed This Visit       Respiratory   Viral URI with cough - Primary    Symptoms started Saturday -mild  No fever  Congestion / very mild cough and ETD right ear  Some sinus pressure-will watch for signs and symptoms of sinus or ear bact infection   Covid result   Discussed symptom control-see AVS Update if not starting to improve in a week or if worsening  Call back and Er precautions noted in detail today        Relevant Orders   POC COVID-19 BinaxNow (Completed)     Nervous and Auditory   ETD (eustachian tube dysfunction)  Right side Associated with sinus congestion and uri symptoms   Recommend use of steroid ns  Update if not starting to improve in a week or if worsening  Call back and Er precautions noted in detail today

## 2022-12-14 NOTE — Patient Instructions (Addendum)
Use a steroid ns (flonase) twice daily for 3 days then once daily for 2 weeks  I sent it to your pharmacy   Drink fluids and rest  Salt water gargle is ok  mucinex DM is good for cough and congestion  Nasal saline for congestion as needed  Tylenol for fever or pain or headache  Please alert [place] if symptoms worsen (if severe or short of breath please go to the ER)    Covid test negative today   Flu shot today

## 2022-12-23 ENCOUNTER — Encounter (HOSPITAL_COMMUNITY): Payer: Self-pay | Admitting: Gastroenterology

## 2022-12-24 ENCOUNTER — Other Ambulatory Visit: Payer: Self-pay

## 2022-12-24 NOTE — Progress Notes (Addendum)
Pre op call eval Name:Hannah Rocha  PCP-Katherine Clark NP Cardiologist-Peter Eden Emms MD  EKG-09/28/22 Echo-n/a Cath-n/a Stress-n/a ICD/PM-n/a Blood thinner-n/a GLP-1-Rybelsus daily- hold 24 hr  Hx:DM,murmur ASD repair 1999, eustachian tube dysfunction, IBS, Dysplastic nevus. Last saw cardiology 09/28/22 recommended f/u 1 year, per pt no cardiac symptoms. Called into pcp 9/21 for viral URI with cough and ear pain. They prescribed her some steroid nasal spray but has finished and said symptoms were better by 25th. Also of note she is listed difficult intubation, pt reports no issues with anesthesia.  Anesthesia Review: Yes

## 2022-12-30 ENCOUNTER — Encounter: Payer: Self-pay | Admitting: Gastroenterology

## 2022-12-31 ENCOUNTER — Encounter (HOSPITAL_COMMUNITY): Payer: Self-pay | Admitting: Gastroenterology

## 2022-12-31 ENCOUNTER — Ambulatory Visit (HOSPITAL_COMMUNITY): Payer: BC Managed Care – PPO | Admitting: Anesthesiology

## 2022-12-31 ENCOUNTER — Other Ambulatory Visit: Payer: Self-pay

## 2022-12-31 ENCOUNTER — Encounter (HOSPITAL_COMMUNITY)
Admission: RE | Disposition: A | Discharge status: Home / Self Care | Payer: Self-pay | Source: Home / Self Care | Attending: Gastroenterology

## 2022-12-31 ENCOUNTER — Ambulatory Visit (HOSPITAL_COMMUNITY)
Admission: RE | Admit: 2022-12-31 | Discharge: 2022-12-31 | Disposition: A | Discharge status: Home / Self Care | Payer: BC Managed Care – PPO | Attending: Gastroenterology | Admitting: Gastroenterology

## 2022-12-31 DIAGNOSIS — E119 Type 2 diabetes mellitus without complications: Secondary | ICD-10-CM | POA: Insufficient documentation

## 2022-12-31 DIAGNOSIS — Z1211 Encounter for screening for malignant neoplasm of colon: Secondary | ICD-10-CM | POA: Diagnosis not present

## 2022-12-31 DIAGNOSIS — K640 First degree hemorrhoids: Secondary | ICD-10-CM

## 2022-12-31 DIAGNOSIS — Z09 Encounter for follow-up examination after completed treatment for conditions other than malignant neoplasm: Secondary | ICD-10-CM | POA: Diagnosis not present

## 2022-12-31 DIAGNOSIS — Z860100 Personal history of colon polyps, unspecified: Secondary | ICD-10-CM | POA: Diagnosis not present

## 2022-12-31 DIAGNOSIS — F419 Anxiety disorder, unspecified: Secondary | ICD-10-CM | POA: Diagnosis not present

## 2022-12-31 DIAGNOSIS — I10 Essential (primary) hypertension: Secondary | ICD-10-CM | POA: Diagnosis not present

## 2022-12-31 DIAGNOSIS — Z87891 Personal history of nicotine dependence: Secondary | ICD-10-CM | POA: Insufficient documentation

## 2022-12-31 DIAGNOSIS — Z7984 Long term (current) use of oral hypoglycemic drugs: Secondary | ICD-10-CM | POA: Diagnosis not present

## 2022-12-31 DIAGNOSIS — K219 Gastro-esophageal reflux disease without esophagitis: Secondary | ICD-10-CM | POA: Insufficient documentation

## 2022-12-31 DIAGNOSIS — Z860101 Personal history of adenomatous and serrated colon polyps: Secondary | ICD-10-CM | POA: Diagnosis not present

## 2022-12-31 DIAGNOSIS — Z79899 Other long term (current) drug therapy: Secondary | ICD-10-CM | POA: Insufficient documentation

## 2022-12-31 DIAGNOSIS — K449 Diaphragmatic hernia without obstruction or gangrene: Secondary | ICD-10-CM | POA: Diagnosis not present

## 2022-12-31 HISTORY — PX: COLONOSCOPY WITH PROPOFOL: SHX5780

## 2022-12-31 LAB — GLUCOSE, CAPILLARY: Glucose-Capillary: 118 mg/dL — ABNORMAL HIGH (ref 70–99)

## 2022-12-31 SURGERY — COLONOSCOPY WITH PROPOFOL
Anesthesia: Monitor Anesthesia Care

## 2022-12-31 MED ORDER — PROPOFOL 10 MG/ML IV BOLUS
INTRAVENOUS | Status: DC | PRN
  Administered 2022-12-31 (×2): 30 mg via INTRAVENOUS

## 2022-12-31 MED ORDER — PROPOFOL 500 MG/50ML IV EMUL
INTRAVENOUS | Status: AC
  Filled 2022-12-31: qty 50

## 2022-12-31 MED ORDER — SODIUM CHLORIDE 0.9 % IV SOLN
INTRAVENOUS | Status: DC
  Administered 2022-12-31: 500 mL via INTRAVENOUS

## 2022-12-31 MED ORDER — PANTOPRAZOLE SODIUM 40 MG PO TBEC: 40.0000 mg | DELAYED_RELEASE_TABLET | Freq: Every day | ORAL | 3 refills | Status: DC

## 2022-12-31 MED ORDER — PROPOFOL 500 MG/50ML IV EMUL
INTRAVENOUS | Status: DC | PRN
  Administered 2022-12-31: 100 ug/kg/min via INTRAVENOUS

## 2022-12-31 MED ORDER — PROPOFOL 10 MG/ML IV BOLUS
INTRAVENOUS | Status: AC
  Filled 2022-12-31: qty 20

## 2022-12-31 SURGICAL SUPPLY — 22 items
ELECT REM PT RETURN 9FT ADLT (ELECTROSURGICAL)
ELECTRODE REM PT RTRN 9FT ADLT (ELECTROSURGICAL) IMPLANT
FCP BXJMBJMB 240X2.8X (CUTTING FORCEPS)
FLOOR PAD 36X40 (MISCELLANEOUS) ×1
FORCEPS BIOP RAD 4 LRG CAP 4 (CUTTING FORCEPS) IMPLANT
FORCEPS BIOP RJ4 240 W/NDL (CUTTING FORCEPS)
FORCEPS BXJMBJMB 240X2.8X (CUTTING FORCEPS) IMPLANT
INJECTOR/SNARE I SNARE (MISCELLANEOUS) IMPLANT
LUBRICANT JELLY 4.5OZ STERILE (MISCELLANEOUS) IMPLANT
MANIFOLD NEPTUNE II (INSTRUMENTS) IMPLANT
NDL SCLEROTHERAPY 25GX240 (NEEDLE) IMPLANT
NEEDLE SCLEROTHERAPY 25GX240 (NEEDLE)
PAD FLOOR 36X40 (MISCELLANEOUS) ×1 IMPLANT
PROBE APC STR FIRE (PROBE) IMPLANT
PROBE INJECTION GOLD (MISCELLANEOUS)
PROBE INJECTION GOLD 7FR (MISCELLANEOUS) IMPLANT
SNARE ROTATE MED OVAL 20MM (MISCELLANEOUS) IMPLANT
SYR 50ML LL SCALE MARK (SYRINGE) IMPLANT
TRAP SPECIMEN MUCOUS 40CC (MISCELLANEOUS) IMPLANT
TUBING ENDO SMARTCAP PENTAX (MISCELLANEOUS) IMPLANT
TUBING IRRIGATION ENDOGATOR (MISCELLANEOUS) ×1 IMPLANT
WATER STERILE IRR 1000ML POUR (IV SOLUTION) IMPLANT

## 2022-12-31 NOTE — Interval H&P Note (Signed)
History and Physical Interval Note:  12/31/2022 8:01 AM  Hannah Rocha  has presented today for surgery, with the diagnosis of colon polyp, screen.  The various methods of treatment have been discussed with the patient and family. After consideration of risks, benefits and other options for treatment, the patient has consented to  Procedure(s): COLONOSCOPY WITH PROPOFOL (N/A) as a surgical intervention.  The patient's history has been reviewed, patient examined, no change in status, stable for surgery.  I have reviewed the patient's chart and labs.  Questions were answered to the patient's satisfaction.     Venita Lick. Russella Dar

## 2022-12-31 NOTE — Anesthesia Postprocedure Evaluation (Signed)
Anesthesia Post Note  Patient: Hannah Rocha  Procedure(s) Performed: COLONOSCOPY WITH PROPOFOL     Patient location during evaluation: PACU Anesthesia Type: MAC Level of consciousness: awake and alert Pain management: pain level controlled Vital Signs Assessment: post-procedure vital signs reviewed and stable Respiratory status: spontaneous breathing, nonlabored ventilation, respiratory function stable and patient connected to nasal cannula oxygen Cardiovascular status: stable and blood pressure returned to baseline Postop Assessment: no apparent nausea or vomiting Anesthetic complications: no  No notable events documented.  Last Vitals:  Vitals:   12/31/22 0850 12/31/22 0900  BP: (!) 147/68 (!) 150/67  Pulse: (!) 55 (!) 57  Resp: 16 16  Temp:    SpO2: 99% 100%    Last Pain:  Vitals:   12/31/22 0900  TempSrc:   PainSc: 0-No pain                 Shelton Silvas

## 2022-12-31 NOTE — Transfer of Care (Signed)
Immediate Anesthesia Transfer of Care Note  Patient: Hannah Rocha  Procedure(s) Performed: COLONOSCOPY WITH PROPOFOL  Patient Location: PACU and Endoscopy Unit  Anesthesia Type:MAC  Level of Consciousness: awake, alert , oriented, and patient cooperative  Airway & Oxygen Therapy: Patient Spontanous Breathing and Patient connected to face mask oxygen  Post-op Assessment: Report given to RN and Post -op Vital signs reviewed and stable  Post vital signs: Reviewed and stable  Last Vitals:  Vitals Value Taken Time  BP 124/69 12/31/22 0834  Temp    Pulse 62 12/31/22 0835  Resp 19 12/31/22 0835  SpO2 100 % 12/31/22 0835  Vitals shown include unfiled device data.  Last Pain:  Vitals:   12/31/22 0628  TempSrc: Tympanic  PainSc: 0-No pain         Complications: No notable events documented.

## 2022-12-31 NOTE — Anesthesia Procedure Notes (Signed)
Procedure Name: MAC Date/Time: 12/31/2022 8:07 AM  Performed by: Elyn Peers, CRNAPre-anesthesia Checklist: Patient identified, Emergency Drugs available, Suction available, Patient being monitored and Timeout performed Oxygen Delivery Method: Simple face mask Placement Confirmation: positive ETCO2

## 2022-12-31 NOTE — Discharge Instructions (Signed)

## 2022-12-31 NOTE — Anesthesia Preprocedure Evaluation (Addendum)
Anesthesia Evaluation  Patient identified by MRN, date of birth, ID band Patient awake    Reviewed: Allergy & Precautions, NPO status , Patient's Chart, lab work & pertinent test results  History of Anesthesia Complications (+) PONV and history of anesthetic complications  Airway Mallampati: II       Dental  (+) Caps,    Pulmonary former smoker   breath sounds clear to auscultation       Cardiovascular hypertension, Pt. on medications + Valvular Problems/Murmurs  Rhythm:Regular Rate:Normal     Neuro/Psych  PSYCHIATRIC DISORDERS Anxiety      Neuromuscular disease    GI/Hepatic Neg liver ROS, hiatal hernia,GERD  Medicated,,  Endo/Other  diabetes, Type 2, Oral Hypoglycemic Agents    Renal/GU negative Renal ROS     Musculoskeletal negative musculoskeletal ROS (+)    Abdominal   Peds  Hematology  (+) Blood dyscrasia, anemia   Anesthesia Other Findings   Reproductive/Obstetrics                             Anesthesia Physical Anesthesia Plan  ASA: 3  Anesthesia Plan: MAC   Post-op Pain Management: Minimal or no pain anticipated   Induction: Intravenous  PONV Risk Score and Plan: 0 and Propofol infusion  Airway Management Planned: Natural Airway and Nasal Cannula  Additional Equipment: None  Intra-op Plan:   Post-operative Plan:   Informed Consent: I have reviewed the patients History and Physical, chart, labs and discussed the procedure including the risks, benefits and alternatives for the proposed anesthesia with the patient or authorized representative who has indicated his/her understanding and acceptance.       Plan Discussed with: CRNA  Anesthesia Plan Comments:        Anesthesia Quick Evaluation

## 2022-12-31 NOTE — Op Note (Signed)
[facility] Patient Name: Hannah Rocha Procedure Date: 12/31/2022 MRN: [id number] Attending MD: Meryl Dare , MD, [phone] Date of Birth: 29-Oct-1965 CSN: [id number] Age: 57 Admit Type: Outpatient Procedure:                Colonoscopy Indications:              Surveillance: Personal history of adenomatous                            polyps on last colonoscopy 5 years ago Providers:                Venita Lick. Russella Dar, MD, Marge Duncans, RN, Beryle Beams, Technician, Geoffery Lyons, Technician Referring MD:             Doreene Nest, NP Medicines:                Monitored Anesthesia Care Complications:            No immediate complications. Estimated blood loss:                            None. Estimated Blood Loss:     Estimated blood loss: none. Procedure:                Pre-Anesthesia Assessment:                           - Prior to the procedure, a History and Physical                            was performed, and patient medications and                            allergies were reviewed. The patient's tolerance of                            previous anesthesia was also reviewed. The risks                            and benefits of the procedure and the sedation                            options and risks were discussed with the patient.                            All questions were answered, and informed consent                            was obtained. Prior Anticoagulants: The patient has                            taken no anticoagulant or antiplatelet agents. ASA  Grade Assessment: II - A patient with mild systemic                            disease. After reviewing the risks and benefits,                            the patient was deemed in satisfactory condition to                            undergo the procedure.                           After obtaining informed consent, the colonoscope                             was passed under direct vision. Throughout the                            procedure, the patient's blood pressure, pulse, and                            oxygen saturations were monitored continuously. The                            [id number]-HQ190L ([id number]) Olympus colonoscope was                            introduced through the anus and advanced to the the                            cecum, identified by appendiceal orifice and                            ileocecal valve. The ileocecal valve, appendiceal                            orifice, and rectum were photographed. The quality                            of the bowel preparation was good. The colonoscopy                            was performed without difficulty. The patient                            tolerated the procedure well. Scope In: 8:11:56 AM Scope Out: 8:28:10 AM Scope Withdrawal Time: 0 hours 8 minutes 32 seconds  Total Procedure Duration: 0 hours 16 minutes 14 seconds  Findings:      The perianal and digital rectal examinations were normal.      Internal hemorrhoids were found during retroflexion. The hemorrhoids       were small and Grade I (internal hemorrhoids that do not prolapse).      The exam was otherwise without abnormality on direct and retroflexion  views. Impression:               - Internal hemorrhoids.                           - The examination was otherwise normal on direct                            and retroflexion views.                           - No specimens collected. Moderate Sedation:      Not Applicable - Patient had care per Anesthesia. Recommendation:           - Repeat colonoscopy in 10 years for surveillance.                           - Patient has a contact number available for                            emergencies. The signs and symptoms of potential                            delayed complications were discussed with the                            patient. Return to  normal activities tomorrow.                            Written discharge instructions were provided to the                            patient.                           - Resume previous diet.                           - Continue present medications. Procedure Code(s):        --- Professional ---                           U9811, Colorectal cancer screening; colonoscopy on                            individual at high risk Diagnosis Code(s):        --- Professional ---                           Z86.010, Personal history of colonic polyps                           K64.0, First degree hemorrhoids CPT copyright 2022 American [employer] Association. All rights reserved. The codes documented in this report are preliminary and upon coder review may  be revised to meet current compliance requirements. Meryl Dare, MD 12/31/2022 8:37:02 AM This report has been signed electronically. Number of Addenda: 0

## 2023-01-01 ENCOUNTER — Encounter: Payer: BC Managed Care – PPO | Admitting: Gastroenterology

## 2023-01-03 ENCOUNTER — Encounter (HOSPITAL_COMMUNITY): Payer: Self-pay | Admitting: Gastroenterology

## 2023-01-05 DIAGNOSIS — G5602 Carpal tunnel syndrome, left upper limb: Secondary | ICD-10-CM | POA: Diagnosis not present

## 2023-01-12 DIAGNOSIS — L299 Pruritus, unspecified: Secondary | ICD-10-CM | POA: Diagnosis not present

## 2023-01-12 DIAGNOSIS — I872 Venous insufficiency (chronic) (peripheral): Secondary | ICD-10-CM | POA: Diagnosis not present

## 2023-01-12 DIAGNOSIS — R600 Localized edema: Secondary | ICD-10-CM | POA: Diagnosis not present

## 2023-01-12 DIAGNOSIS — R252 Cramp and spasm: Secondary | ICD-10-CM | POA: Diagnosis not present

## 2023-01-12 DIAGNOSIS — I83892 Varicose veins of left lower extremities with other complications: Secondary | ICD-10-CM | POA: Diagnosis not present

## 2023-01-22 ENCOUNTER — Ambulatory Visit: Payer: BC Managed Care – PPO | Admitting: Primary Care

## 2023-01-22 ENCOUNTER — Other Ambulatory Visit: Payer: Self-pay | Admitting: Primary Care

## 2023-01-22 ENCOUNTER — Encounter: Payer: Self-pay | Admitting: Primary Care

## 2023-01-22 VITALS — BP 138/80 | HR 82 | Temp 98.2°F | Ht 64.0 in | Wt 172.0 lb

## 2023-01-22 DIAGNOSIS — E1165 Type 2 diabetes mellitus with hyperglycemia: Secondary | ICD-10-CM

## 2023-01-22 DIAGNOSIS — Z7984 Long term (current) use of oral hypoglycemic drugs: Secondary | ICD-10-CM | POA: Diagnosis not present

## 2023-01-22 LAB — POCT GLYCOSYLATED HEMOGLOBIN (HGB A1C): Hemoglobin A1C: 7.3 % — AB (ref 4.0–5.6)

## 2023-01-22 MED ORDER — RYBELSUS 3 MG PO TABS
3.0000 mg | ORAL_TABLET | Freq: Every day | ORAL | 0 refills | Status: DC
Start: 2023-01-22 — End: 2023-01-22

## 2023-01-22 NOTE — Telephone Encounter (Signed)
Can we call the pharmacy and let them know that she took 1 month of Rybelsus 3 mg from late July to late August, then never contacted [place] to increase dose to 7 mg. She's been without any form of Rybelsus since late August. Okay to start with 7 mg dose?  Or because the insurance may not pay for another 3 mg tablet,  would they recommend we prescribe 7 mg and have her take 1/2 tablet for 30 days, then increase to 1 full tablet?

## 2023-01-22 NOTE — Progress Notes (Signed)
Subjective:    Patient ID: Hannah Rocha, female    DOB: June 28, 1965, 57 y.o.   MRN: [id number]  HPI  Hannah Rocha is a very pleasant 57 y.o. female with a history of hypertension, type 2 diabetes, hyperlipidemia, vitamin D deficiency who presents today for follow-up of diabetes.  Current medications include: Metformin XR 1000 mg daily, Rybelsus 3 mg daily. She's not had Rybelsus in 2 months as she forgot to refill.   She is checking her blood glucose 1 times daily and is getting readings of:  AM: mid 100s  Last A1C: 6.0 in May 2024, 6.8 in July 2024, 7.24 January 2023 Last Eye Exam: Up-to-date Last Foot Exam: Due Pneumonia Vaccination: 2019 Urine Microalbumin: Up-to-date Statin: Rosuvastatin  Dietary changes since last visit: Not good. Increased consumption of fried food and sweets. Increased cravings. She is motivated to change.   Exercise: Walking     Review of Systems  Respiratory:  Negative for shortness of breath.   Cardiovascular:  Negative for chest pain.  Neurological:  Negative for dizziness and numbness.         Past Medical History:  Diagnosis Date   Allergy    Anemia    hx   Anxiety    CARDIAC MURMUR, HX OF 05/03/2007   Annotation: ASD Qualifier: Diagnosis of  By: Hetty Ely MD, Franne Grip    Cataract    FLOATING CATARACT    De Quervain's tenosynovitis, right 03/29/2019   Diabetes mellitus without complication (HCC)    Dysplastic nevus 01/14/2015   right spinal mid back, severe, excised 03/04/2015   Dysplastic nevus 02/08/2018   right upper back/post shoulder, moderate   Dysplastic nevus 02/08/2018   left spinal mid upper back, severe, excised 03/21/2018   Generalized abdominal pain 09/19/2021   GERD (gastroesophageal reflux disease)    H/O hiatal hernia    Heart murmur    ASD repair 1999- no meds    HIATAL HERNIA 07/09/2008   Qualifier: Diagnosis of  By: Koleen Distance CMA (AAMA), Leisha     Hx of dysplastic nevus    multiple  sites, some severe   Hyperlipidemia    IBS 06/21/2009   Qualifier: Diagnosis of  By: Jarold Motto MD Lang Snow    MENOPAUSAL SYNDROME 01/15/2010   Qualifier: Diagnosis of  By: Dayton Martes MD, Talia     Muscle tightness 09/19/2021   Obesity    Plant allergic contact dermatitis 10/13/2018   PONV (postoperative nausea and vomiting)    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   Annotation: REPAIRED AS CHILD Qualifier: Diagnosis of  By: Hetty Ely MD, Franne Grip    Viral URI with cough 11/30/2012    Social History   Socioeconomic History   Marital status: Married    Spouse name: Not on file   Number of children: 0   Years of education: Not on file   Highest education level: Not on file  Occupational History   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    Employer: [employer]  Tobacco Use   Smoking status: Former    Current packs/day: 0.00    Types: Cigarettes    Quit date: 02/21/1987    Years since quitting: 35.9   Smokeless tobacco: Never   Tobacco comments:    22 years ago-light smoker  Vaping Use   Vaping status: Never Used  Substance and Sexual Activity   Alcohol use: Yes    Alcohol/week: 0.0 standard drinks of alcohol    Comment: occasional -  hard apple cider, shot   Drug use: No   Sexual activity: Not Currently    Partners: Male    Birth control/protection: Surgical    Comment: tubaligation/hysterctomy  Other Topics Concern   Not on file  Social History Narrative   Daily caffeine use   Social Determinants of Health   Financial Resource Strain: Not on file  Food Insecurity: Not on file  Transportation Needs: Not on file  Physical Activity: Not on file  Stress: Not on file  Social Connections: Not on file  Intimate Partner Violence: Not on file    Past Surgical History:  Procedure Laterality Date   ASD REPAIR  1999   [facility]   BREAST CYST ASPIRATION Right 2013   Carpal tunnel repair Right 11/27/2022   CATARACT EXTRACTION Bilateral    Right done  September 2023 and Left January 08 2022   CATARACT EXTRACTION Bilateral 12/21/2021   CLEFT LIP REPAIR     COLONOSCOPY     COLONOSCOPY WITH PROPOFOL N/A 12/31/2022   Procedure: COLONOSCOPY WITH PROPOFOL;  Surgeon: Meryl Dare, MD;  Location: [facility];  Service: Gastroenterology;  Laterality: N/A;   LAPAROSCOPIC ASSISTED VAGINAL HYSTERECTOMY Bilateral 04/13/2013   Procedure: LAPAROSCOPIC ASSISTED VAGINAL HYSTERECTOMY;  Surgeon: Allie Bossier, MD;  Location: [facility];  Service: Gynecology;  Laterality: Bilateral;- pt states was abdominal hysterectomy    LAPAROSCOPIC TUBAL LIGATION  03/21/2012   Procedure: LAPAROSCOPIC TUBAL LIGATION;  Surgeon: Allie Bossier, MD;  Location: [facility];  Service: Gynecology;  Laterality: Bilateral;   MANDIBLE FRACTURE SURGERY     Plantar Fascitis Repair Bilateral 01/2015    Family History  Problem Relation Age of Onset   Heart disease Mother    Colon polyps Mother    Diabetes Mother    Prostate cancer Father    Heart disease Father    Cancer Father        prostate   Diabetes Maternal Aunt    Diabetes Paternal Grandmother    Fibromyalgia Sister    Colon cancer Neg Hx    Breast cancer Neg Hx    Esophageal cancer Neg Hx    Rectal cancer Neg Hx    Stomach cancer Neg Hx     Allergies  Allergen Reactions   Sertraline Hcl     REACTION: vomiting, severe anxiety    Current Outpatient Medications on File Prior to Visit  Medication Sig Dispense Refill   acetaminophen (TYLENOL) 500 MG tablet Take 500 mg by mouth every 6 (six) hours as needed for mild pain.      B Complex Vitamins (VITAMIN B COMPLEX PO) Take 1 tablet by mouth daily.     busPIRone (BUSPAR) 30 MG tablet TAKE 1 TABLET BY MOUTH EVERY MORNING 1/2 TAB AT LUNCH, AND 1/2 TAB AT BEDTIME FOR ANXIETY. 180 tablet 2   escitalopram (LEXAPRO) 10 MG tablet TAKE 1 TABLET (10 MG TOTAL) BY MOUTH DAILY. FOR ANXIETY. 90 tablet 3   flintstones complete (FLINTSTONES) 60 MG chewable tablet Chew 1 tablet by mouth  daily.     fluticasone (FLONASE) 50 MCG/ACT nasal spray Place 2 sprays into both nostrils daily. 16 g 6   ibuprofen (ADVIL) 800 MG tablet Take 1 tablet (800 mg total) by mouth every 8 (eight) hours as needed. 60 tablet 3   losartan-hydrochlorothiazide (HYZAAR) 100-12.5 MG tablet TAKE 1 TABLET BY MOUTH EVERY DAY 90 tablet 3   metFORMIN (GLUCOPHAGE-XR) 500 MG 24 hr tablet TAKE 2 TABLETS (1,000 MG TOTAL) BY MOUTH  DAILY WITH BREAKFAST. FOR DIABETES. 180 tablet 1   pantoprazole (PROTONIX) 40 MG tablet Take 1 tablet (40 mg total) by mouth daily. 90 tablet 3   rosuvastatin (CRESTOR) 20 MG tablet TAKE 1 TABLET BY MOUTH DAILY. FOR CHOLESTEROL 90 tablet 3   No current facility-administered medications on file prior to visit.    BP 138/80   Pulse 82   Temp 98.2 F (36.8 C) (Temporal)   Ht 5\' 4"  (1.626 m)   Wt 172 lb (78 kg)   LMP 03/24/2013   SpO2 97%   BMI 29.52 kg/m  Objective:   Physical Exam Cardiovascular:     Rate and Rhythm: Normal rate and regular rhythm.  Pulmonary:     Effort: Pulmonary effort is normal.     Breath sounds: Normal breath sounds.  Musculoskeletal:     Cervical back: Neck supple.  Skin:    General: Skin is warm and dry.  Neurological:     Mental Status: She is alert and oriented to person, place, and time.  Psychiatric:        Mood and Affect: Mood normal.           Assessment & Plan:  Type 2 diabetes mellitus with hyperglycemia, without long-term current use of insulin (HCC) Assessment & Plan: Deteriorated with A1c of 7.3 today.  Resume Rybelsus at 3 mg daily x 30 days, then increase to Rybelsus 7 mg daily thereafter. Continue metformin XR 1000 mg daily.  Discussed to work on diet, continue regular exercise.  Foot exam today.  Follow-up in February 2025.  Orders: -     POCT glycosylated hemoglobin (Hb A1C) -     Rybelsus; Take 1 tablet (3 mg total) by mouth daily. for diabetes.  Dispense: 30 tablet; Refill: 0        Doreene Nest,  NP

## 2023-01-22 NOTE — Telephone Encounter (Signed)
Noted.  Prescription for Rybelsus 7 mg sent to pharmacy.  Also see response to patient via MyChart.

## 2023-01-22 NOTE — Telephone Encounter (Signed)
Called and spoke with pharmacist they stated Rybelsus is one that cannot be cut in half or crushed. She said its okay to do 7mg  dose, patient will just need to watch glucose reading very carefully to make sure it doesn't drop her too much.

## 2023-01-22 NOTE — Assessment & Plan Note (Signed)
Deteriorated with A1c of 7.3 today.  Resume Rybelsus at 3 mg daily x 30 days, then increase to Rybelsus 7 mg daily thereafter. Continue metformin XR 1000 mg daily.  Discussed to work on diet, continue regular exercise.  Foot exam today.  Follow-up in February 2025.

## 2023-01-29 ENCOUNTER — Encounter: Payer: Self-pay | Admitting: Gastroenterology

## 2023-02-10 DIAGNOSIS — I83892 Varicose veins of left lower extremities with other complications: Secondary | ICD-10-CM | POA: Diagnosis not present

## 2023-02-23 ENCOUNTER — Telehealth (INDEPENDENT_AMBULATORY_CARE_PROVIDER_SITE_OTHER): Payer: BC Managed Care – PPO | Admitting: Nurse Practitioner

## 2023-02-23 VITALS — Temp 98.8°F | Ht 64.0 in | Wt 167.0 lb

## 2023-02-23 DIAGNOSIS — U071 COVID-19: Secondary | ICD-10-CM | POA: Diagnosis not present

## 2023-02-23 DIAGNOSIS — K219 Gastro-esophageal reflux disease without esophagitis: Secondary | ICD-10-CM

## 2023-02-23 DIAGNOSIS — H938X3 Other specified disorders of ear, bilateral: Secondary | ICD-10-CM | POA: Diagnosis not present

## 2023-02-23 MED ORDER — FAMOTIDINE 20 MG PO TABS
20.0000 mg | ORAL_TABLET | Freq: Every day | ORAL | 0 refills | Status: DC
Start: 2023-02-23 — End: 2023-03-25

## 2023-02-23 MED ORDER — PREDNISONE 20 MG PO TABS
ORAL_TABLET | ORAL | 0 refills | Status: AC
Start: 2023-02-23 — End: 2023-03-01

## 2023-02-23 NOTE — Assessment & Plan Note (Signed)
Increased with use of Rybelsus.  Patient currently maintained on Protonix 40 mg daily.  Will add on famotidine 20 mg nightly.  Patient still has continued trouble she will follow-up with primary care provider can consider increasing Protonix to 40 mg twice daily thereafter

## 2023-02-23 NOTE — Assessment & Plan Note (Signed)
At home test was positive.  Patient is outside the window of antiviral treatment.  Symptomatic treatment with CDC guidelines/recommendations in regards to self-isolation/quarantine.  Signs and symptoms reviewed when to be seen again

## 2023-02-23 NOTE — Assessment & Plan Note (Signed)
Patient is currently already using fluticasone nasal spray will do a steroid taper.  Steroid precautions reviewed.  Did inform patient that steroids will increase blood glucose numbers

## 2023-02-23 NOTE — Progress Notes (Signed)
Ph: [phone] Fax: [phone]   Patient ID: Hannah Rocha, female    DOB: [birth year], 57 y.o.   MRN: [id number]  Virtual visit completed through MyChart, a video enabled telemedicine application. Due to national recommendations of social distancing due to COVID-19, a virtual visit is felt to be most appropriate for this patient at this time. Reviewed limitations, risks, security and privacy concerns of performing a virtual visit and the availability of in person appointments. I also reviewed that there may be a patient responsible charge related to this service. The patient agreed to proceed.   Patient location: home Provider location: [place] at [facility], office Persons participating in this virtual visit: patient, provider   If any vitals were documented, they were collected by patient at home unless specified below.    Temp 98.8 F (37.1 C) Comment: per patient  Ht 5\' 4"  (1.626 m) Comment: per chart  Wt 167 lb (75.8 kg) Comment: per patient  LMP 03/24/2013   BMI 28.67 kg/m    CC: COVID-19 Subjective:   HPI: Hannah Rocha is a 57 y.o. female presenting on 02/23/2023 for Covid Positive (Symptoms started 11/27. Nausea, pressure headache, cough, slight body aches. )    Symptoms started on 02/17/2023 Covid positive on 02/22/2023 Sick contacs: states that her sister had a cough Covid: original vaccine and booster She ahs been doing tylenol, ibuprofen, cough medication, coricidin HBP. Has helped some   GERD: Patient states that she has been on Rybelsus oral medication she has had increased heartburn.  Patient states that it lasts all day.  Patient is currently maintained on Protonix 40 mg daily but states has not effective.    Relevant past medical, surgical, family and social history reviewed and updated as indicated. Interim medical history since our last visit reviewed. Allergies and medications reviewed and updated. Outpatient Medications Prior  to Visit  Medication Sig Dispense Refill   acetaminophen (TYLENOL) 500 MG tablet Take 500 mg by mouth every 6 (six) hours as needed for mild pain.      B Complex Vitamins (VITAMIN B COMPLEX PO) Take 1 tablet by mouth daily.     busPIRone (BUSPAR) 30 MG tablet TAKE 1 TABLET BY MOUTH EVERY MORNING 1/2 TAB AT LUNCH, AND 1/2 TAB AT BEDTIME FOR ANXIETY. 180 tablet 2   escitalopram (LEXAPRO) 10 MG tablet TAKE 1 TABLET (10 MG TOTAL) BY MOUTH DAILY. FOR ANXIETY. 90 tablet 3   flintstones complete (FLINTSTONES) 60 MG chewable tablet Chew 1 tablet by mouth daily.     fluticasone (FLONASE) 50 MCG/ACT nasal spray Place 2 sprays into both nostrils daily. 16 g 6   ibuprofen (ADVIL) 800 MG tablet Take 1 tablet (800 mg total) by mouth every 8 (eight) hours as needed. 60 tablet 3   losartan-hydrochlorothiazide (HYZAAR) 100-12.5 MG tablet TAKE 1 TABLET BY MOUTH EVERY DAY 90 tablet 3   metFORMIN (GLUCOPHAGE-XR) 500 MG 24 hr tablet TAKE 2 TABLETS (1,000 MG TOTAL) BY MOUTH DAILY WITH BREAKFAST. FOR DIABETES. 180 tablet 1   pantoprazole (PROTONIX) 40 MG tablet Take 1 tablet (40 mg total) by mouth daily. 90 tablet 3   rosuvastatin (CRESTOR) 20 MG tablet TAKE 1 TABLET BY MOUTH DAILY. FOR CHOLESTEROL 90 tablet 3   Semaglutide (RYBELSUS) 7 MG TABS Take 1 tablet (7 mg total) by mouth daily. for diabetes. 90 tablet 0   No facility-administered medications prior to visit.     Per HPI unless specifically indicated in ROS section below Review of  Systems  Constitutional:  Positive for chills and fatigue. Negative for fever.  HENT:  Positive for ear pain (fullnes), sinus pressure and sinus pain. Negative for ear discharge and sore throat.   Respiratory:  Positive for cough. Negative for shortness of breath.        Productive cough   Cardiovascular:  Negative for chest pain.  Gastrointestinal:  Positive for nausea. Negative for abdominal pain, diarrhea and vomiting.  Musculoskeletal:  Positive for myalgias.  Neurological:   Negative for headaches.   Objective:  Temp 98.8 F (37.1 C) Comment: per patient  Ht 5\' 4"  (1.626 m) Comment: per chart  Wt 167 lb (75.8 kg) Comment: per patient  LMP 03/24/2013   BMI 28.67 kg/m   Wt Readings from Last 3 Encounters:  02/23/23 167 lb (75.8 kg)  01/22/23 172 lb (78 kg)  12/31/22 167 lb (75.8 kg)       Physical exam: Gen: alert, NAD, not ill appearing Pulm: speaks in complete sentences without increased work of breathing Psych: normal mood, normal thought content      Results for orders placed or performed in visit on 01/22/23  POCT glycosylated hemoglobin (Hb A1C)  Result Value Ref Range   Hemoglobin A1C 7.3 (A) 4.0 - 5.6 %   HbA1c POC (<> result, manual entry)     HbA1c, POC (prediabetic range)     HbA1c, POC (controlled diabetic range)     Assessment & Plan:   COVID-19 Assessment & Plan: At home test was positive.  Patient is outside the window of antiviral treatment.  Symptomatic treatment with CDC guidelines/recommendations in regards to self-isolation/quarantine.  Signs and symptoms reviewed when to be seen again   Sensation of fullness in both ears Assessment & Plan: Patient is currently already using fluticasone nasal spray will do a steroid taper.  Steroid precautions reviewed.  Did inform patient that steroids will increase blood glucose numbers  Orders: -     predniSONE; Take 2 tablets (40 mg total) by mouth daily with breakfast for 3 days, THEN 1 tablet (20 mg total) daily with breakfast for 3 days. Avoid NSAIDs.  Dispense: 9 tablet; Refill: 0  Gastroesophageal reflux disease, unspecified whether esophagitis present Assessment & Plan: Increased with use of Rybelsus.  Patient currently maintained on Protonix 40 mg daily.  Will add on famotidine 20 mg nightly.  Patient still has continued trouble she will follow-up with primary care provider can consider increasing Protonix to 40 mg twice daily thereafter  Orders: -     Famotidine; Take 1  tablet (20 mg total) by mouth at bedtime.  Dispense: 30 tablet; Refill: 0     I discussed the assessment and treatment plan with the patient. The patient was provided an opportunity to ask questions and all were answered. The patient agreed with the plan and demonstrated an understanding of the instructions. The patient was advised to call back or seek an in-person evaluation if the symptoms worsen or if the condition fails to improve as anticipated.  Follow up plan: No follow-ups on file.  Audria Nine, NP

## 2023-03-03 DIAGNOSIS — I83892 Varicose veins of left lower extremities with other complications: Secondary | ICD-10-CM | POA: Diagnosis not present

## 2023-03-08 ENCOUNTER — Encounter: Payer: Self-pay | Admitting: Internal Medicine

## 2023-03-08 ENCOUNTER — Ambulatory Visit: Payer: BC Managed Care – PPO | Admitting: Internal Medicine

## 2023-03-08 VITALS — BP 124/78 | HR 74 | Temp 98.8°F | Ht 64.0 in | Wt 172.0 lb

## 2023-03-08 DIAGNOSIS — H6501 Acute serous otitis media, right ear: Secondary | ICD-10-CM | POA: Insufficient documentation

## 2023-03-08 NOTE — Progress Notes (Signed)
Subjective:    Patient ID: Hannah Rocha, female    DOB: 02-22-1966, 57 y.o.   MRN: [id number]  HPI Here due to stopped up ears since recent COVID infection  Had COVID 2 weeks ago Ears still stopped up No headache or sinus pain No sig ear pain No fever Slight nasal congestion--but no cough No post nasal drip  Hasn't taken anything for this---other than prednisone 2 weeks ago Didn't seem to help any  Current Outpatient Medications on File Prior to Visit  Medication Sig Dispense Refill   acetaminophen (TYLENOL) 500 MG tablet Take 500 mg by mouth every 6 (six) hours as needed for mild pain.      B Complex Vitamins (VITAMIN B COMPLEX PO) Take 1 tablet by mouth daily.     busPIRone (BUSPAR) 30 MG tablet TAKE 1 TABLET BY MOUTH EVERY MORNING 1/2 TAB AT LUNCH, AND 1/2 TAB AT BEDTIME FOR ANXIETY. 180 tablet 2   escitalopram (LEXAPRO) 10 MG tablet TAKE 1 TABLET (10 MG TOTAL) BY MOUTH DAILY. FOR ANXIETY. 90 tablet 3   famotidine (PEPCID) 20 MG tablet Take 1 tablet (20 mg total) by mouth at bedtime. 30 tablet 0   flintstones complete (FLINTSTONES) 60 MG chewable tablet Chew 1 tablet by mouth daily.     fluticasone (FLONASE) 50 MCG/ACT nasal spray Place 2 sprays into both nostrils daily. 16 g 6   ibuprofen (ADVIL) 800 MG tablet Take 1 tablet (800 mg total) by mouth every 8 (eight) hours as needed. 60 tablet 3   losartan-hydrochlorothiazide (HYZAAR) 100-12.5 MG tablet TAKE 1 TABLET BY MOUTH EVERY DAY 90 tablet 3   metFORMIN (GLUCOPHAGE-XR) 500 MG 24 hr tablet TAKE 2 TABLETS (1,000 MG TOTAL) BY MOUTH DAILY WITH BREAKFAST. FOR DIABETES. 180 tablet 1   pantoprazole (PROTONIX) 40 MG tablet Take 1 tablet (40 mg total) by mouth daily. 90 tablet 3   rosuvastatin (CRESTOR) 20 MG tablet TAKE 1 TABLET BY MOUTH DAILY. FOR CHOLESTEROL 90 tablet 3   Semaglutide (RYBELSUS) 7 MG TABS Take 1 tablet (7 mg total) by mouth daily. for diabetes. 90 tablet 0   No current facility-administered medications on  file prior to visit.    Allergies  Allergen Reactions   Sertraline Hcl     REACTION: vomiting, severe anxiety    Past Medical History:  Diagnosis Date   Allergy    Anemia    hx   Anxiety    CARDIAC MURMUR, HX OF 05/03/2007   Annotation: ASD Qualifier: Diagnosis of  By: Hetty Ely MD, Franne Grip    Cataract    FLOATING CATARACT    De Quervain's tenosynovitis, right 03/29/2019   Diabetes mellitus without complication (HCC)    Dysplastic nevus 01/14/2015   right spinal mid back, severe, excised 03/04/2015   Dysplastic nevus 02/08/2018   right upper back/post shoulder, moderate   Dysplastic nevus 02/08/2018   left spinal mid upper back, severe, excised 03/21/2018   Generalized abdominal pain 09/19/2021   GERD (gastroesophageal reflux disease)    H/O hiatal hernia    Heart murmur    ASD repair 1999- no meds    HIATAL HERNIA 07/09/2008   Qualifier: Diagnosis of  By: Koleen Distance CMA (AAMA), Leisha     Hx of dysplastic nevus    multiple sites, some severe   Hyperlipidemia    IBS 06/21/2009   Qualifier: Diagnosis of  By: Jarold Motto MD Lang Snow    MENOPAUSAL SYNDROME 01/15/2010   Qualifier: Diagnosis of  By:  Dayton Martes MD, Talia     Muscle tightness 09/19/2021   Obesity    Plant allergic contact dermatitis 10/13/2018   PONV (postoperative nausea and vomiting)    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   Annotation: REPAIRED AS CHILD Qualifier: Diagnosis of  By: Hetty Ely MD, Franne Grip    Viral URI with cough 11/30/2012    Past Surgical History:  Procedure Laterality Date   ASD REPAIR  1999   [facility]   BREAST CYST ASPIRATION Right 2013   Carpal tunnel repair Right 11/27/2022   CATARACT EXTRACTION Bilateral    Right done September 2023 and Left January 08 2022   CATARACT EXTRACTION Bilateral 12/21/2021   CLEFT LIP REPAIR     COLONOSCOPY     COLONOSCOPY WITH PROPOFOL N/A 12/31/2022   Procedure: COLONOSCOPY WITH PROPOFOL;  Surgeon: Meryl Dare, MD;   Location: [facility];  Service: Gastroenterology;  Laterality: N/A;   LAPAROSCOPIC ASSISTED VAGINAL HYSTERECTOMY Bilateral 04/13/2013   Procedure: LAPAROSCOPIC ASSISTED VAGINAL HYSTERECTOMY;  Surgeon: Allie Bossier, MD;  Location: [facility];  Service: Gynecology;  Laterality: Bilateral;- pt states was abdominal hysterectomy    LAPAROSCOPIC TUBAL LIGATION  03/21/2012   Procedure: LAPAROSCOPIC TUBAL LIGATION;  Surgeon: Allie Bossier, MD;  Location: [facility];  Service: Gynecology;  Laterality: Bilateral;   MANDIBLE FRACTURE SURGERY     Plantar Fascitis Repair Bilateral 01/2015    Family History  Problem Relation Age of Onset   Heart disease Mother    Colon polyps Mother    Diabetes Mother    Prostate cancer Father    Heart disease Father    Cancer Father        prostate   Diabetes Maternal Aunt    Diabetes Paternal Grandmother    Fibromyalgia Sister    Colon cancer Neg Hx    Breast cancer Neg Hx    Esophageal cancer Neg Hx    Rectal cancer Neg Hx    Stomach cancer Neg Hx     Social History   Socioeconomic History   Marital status: Married    Spouse name: Not on file   Number of children: 0   Years of education: Not on file   Highest education level: Not on file  Occupational History   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    Employer: [employer]  Tobacco Use   Smoking status: Former    Current packs/day: 0.00    Types: Cigarettes    Quit date: 02/21/1987    Years since quitting: 36.0   Smokeless tobacco: Never   Tobacco comments:    22 years ago-light smoker  Vaping Use   Vaping status: Never Used  Substance and Sexual Activity   Alcohol use: Yes    Alcohol/week: 0.0 standard drinks of alcohol    Comment: occasional - hard apple cider, shot   Drug use: No   Sexual activity: Not Currently    Partners: Male    Birth control/protection: Surgical    Comment: tubaligation/hysterctomy  Other Topics Concern   Not on file  Social History Narrative   Daily caffeine use    Social Drivers of Corporate investment banker Strain: Not on file  Food Insecurity: Not on file  Transportation Needs: Not on file  Physical Activity: Not on file  Stress: Not on file  Social Connections: Not on file  Intimate Partner Violence: Not on file   Review of Systems No tinnitus No hearing loss No recent travel  Objective:   Physical Exam Constitutional:      Appearance: Normal appearance.  HENT:     Left Ear: Tympanic membrane and ear canal normal.     Ears:     Comments: Right TM bulging--mostly laterally  No inflammation on either side    Mouth/Throat:     Pharynx: No oropharyngeal exudate or posterior oropharyngeal erythema.  Musculoskeletal:     Cervical back: Neck supple.  Lymphadenopathy:     Cervical: No cervical adenopathy.  Neurological:     Mental Status: She is alert.            Assessment & Plan:

## 2023-03-08 NOTE — Assessment & Plan Note (Addendum)
Likely from COVID infection Prednisone didn't help---so won't repeat No sig sinus symptoms--no indication of bacterial infection Is already on flonase and loratadine Will wait---if not improving in 2 weeks or so----will refer to ENT

## 2023-03-10 DIAGNOSIS — I83892 Varicose veins of left lower extremities with other complications: Secondary | ICD-10-CM | POA: Diagnosis not present

## 2023-03-10 DIAGNOSIS — Z09 Encounter for follow-up examination after completed treatment for conditions other than malignant neoplasm: Secondary | ICD-10-CM | POA: Diagnosis not present

## 2023-03-19 ENCOUNTER — Other Ambulatory Visit: Payer: Self-pay | Admitting: Primary Care

## 2023-03-19 DIAGNOSIS — F411 Generalized anxiety disorder: Secondary | ICD-10-CM

## 2023-03-23 ENCOUNTER — Other Ambulatory Visit: Payer: Self-pay | Admitting: Nurse Practitioner

## 2023-03-23 ENCOUNTER — Other Ambulatory Visit: Payer: Self-pay | Admitting: Family Medicine

## 2023-03-23 DIAGNOSIS — K219 Gastro-esophageal reflux disease without esophagitis: Secondary | ICD-10-CM

## 2023-03-24 HISTORY — PX: MINOR CARPAL TUNNEL: SHX6472

## 2023-04-02 ENCOUNTER — Encounter: Payer: Self-pay | Admitting: Family Medicine

## 2023-04-05 MED ORDER — IBUPROFEN 800 MG PO TABS: 800.0000 mg | ORAL_TABLET | Freq: Three times a day (TID) | ORAL | 3 refills | Status: DC | PRN

## 2023-04-14 DIAGNOSIS — I83892 Varicose veins of left lower extremities with other complications: Secondary | ICD-10-CM | POA: Diagnosis not present

## 2023-04-15 ENCOUNTER — Other Ambulatory Visit: Payer: Self-pay | Admitting: Primary Care

## 2023-04-15 DIAGNOSIS — E119 Type 2 diabetes mellitus without complications: Secondary | ICD-10-CM

## 2023-04-15 DIAGNOSIS — F411 Generalized anxiety disorder: Secondary | ICD-10-CM

## 2023-04-19 ENCOUNTER — Ambulatory Visit: Payer: BC Managed Care – PPO | Admitting: Dermatology

## 2023-04-19 DIAGNOSIS — Z1283 Encounter for screening for malignant neoplasm of skin: Secondary | ICD-10-CM | POA: Diagnosis not present

## 2023-04-19 DIAGNOSIS — L03031 Cellulitis of right toe: Secondary | ICD-10-CM

## 2023-04-19 DIAGNOSIS — L821 Other seborrheic keratosis: Secondary | ICD-10-CM

## 2023-04-19 DIAGNOSIS — L578 Other skin changes due to chronic exposure to nonionizing radiation: Secondary | ICD-10-CM

## 2023-04-19 DIAGNOSIS — D229 Melanocytic nevi, unspecified: Secondary | ICD-10-CM

## 2023-04-19 DIAGNOSIS — L814 Other melanin hyperpigmentation: Secondary | ICD-10-CM

## 2023-04-19 DIAGNOSIS — W908XXA Exposure to other nonionizing radiation, initial encounter: Secondary | ICD-10-CM

## 2023-04-19 DIAGNOSIS — D692 Other nonthrombocytopenic purpura: Secondary | ICD-10-CM

## 2023-04-19 DIAGNOSIS — D2272 Melanocytic nevi of left lower limb, including hip: Secondary | ICD-10-CM

## 2023-04-19 DIAGNOSIS — D1801 Hemangioma of skin and subcutaneous tissue: Secondary | ICD-10-CM

## 2023-04-19 DIAGNOSIS — D225 Melanocytic nevi of trunk: Secondary | ICD-10-CM

## 2023-04-19 DIAGNOSIS — I781 Nevus, non-neoplastic: Secondary | ICD-10-CM

## 2023-04-19 DIAGNOSIS — Z86018 Personal history of other benign neoplasm: Secondary | ICD-10-CM

## 2023-04-19 MED ORDER — DOXYCYCLINE MONOHYDRATE 100 MG PO CAPS: 100.0000 mg | ORAL_CAPSULE | Freq: Two times a day (BID) | ORAL | 0 refills | Status: AC

## 2023-04-19 MED ORDER — MUPIROCIN 2 % EX OINT: TOPICAL_OINTMENT | CUTANEOUS | 0 refills | Status: DC

## 2023-04-19 NOTE — Patient Instructions (Addendum)
Melanoma ABCDEs  Melanoma is the most dangerous type of skin cancer, and is the leading cause of death from skin disease.  You are more likely to develop melanoma if you: Have light-colored skin, light-colored eyes, or red or blond hair Spend a lot of time in the sun Tan regularly, either outdoors or in a tanning bed Have had blistering sunburns, especially during childhood Have a close family member who has had a melanoma Have atypical moles or large birthmarks  Early detection of melanoma is key since treatment is typically straightforward and cure rates are extremely high if we catch it early.   The first sign of melanoma is often a change in a mole or a new dark spot.  The ABCDE system is a way of remembering the signs of melanoma.  A for asymmetry:  The two halves do not match. B for border:  The edges of the growth are irregular. C for color:  A mixture of colors are present instead of an even brown color. D for diameter:  Melanomas are usually (but not always) greater than 6mm - the size of a pencil eraser. E for evolution:  The spot keeps changing in size, shape, and color.  Please check your skin once per month between visits. You can use a small mirror in front and a large mirror behind you to keep an eye on the back side or your body.   If you see any new or changing lesions before your next follow-up, please call to schedule a visit.  Please continue daily skin protection including broad spectrum sunscreen SPF 30+ to sun-exposed areas, reapplying every 2 hours as needed when you're outdoors.   Staying in the shade or wearing long sleeves, sun glasses (UVA+UVB protection) and wide brim hats (4-inch brim around the entire circumference of the hat) are also recommended for sun protection.    Due to recent changes in healthcare laws, you may see results of your pathology and/or laboratory studies on MyChart before the doctors have had a chance to review them. We understand that in  some cases there may be results that are confusing or concerning to you. Please understand that not all results are received at the same time and often the doctors may need to interpret multiple results in order to provide you with the best plan of care or course of treatment. Therefore, we ask that you please give [place] 2 business days to thoroughly review all your results before contacting the office for clarification. Should we see a critical lab result, you will be contacted sooner.   If You Need Anything After Your Visit  If you have any questions or concerns for your doctor, please call our main line at [phone] and press option 4 to reach your doctor's medical assistant. If no one answers, please leave a voicemail as directed and we will return your call as soon as possible. Messages left after 4 pm will be answered the following business day.   You may also send [place] a message via MyChart. We typically respond to MyChart messages within 1-2 business days.  For prescription refills, please ask your pharmacy to contact our office. Our fax number is [phone].  If you have an urgent issue when the clinic is closed that cannot wait until the next business day, you can page your doctor at the number below.    Please note that while we do our best to be available for urgent issues outside of office hours, we  are not available 24/7.   If you have an urgent issue and are unable to reach [place], you may choose to seek medical care at your doctor's office, retail clinic, urgent care center, or emergency room.  If you have a medical emergency, please immediately call 911 or go to the emergency department.  Pager Numbers  - Dr. Gwen Pounds: [phone]  - Dr. Roseanne Reno: [phone]  - Dr. Katrinka Blazing: [phone]   In the event of inclement weather, please call our main line at [phone] for an update on the status of any delays or closures.  Dermatology Medication Tips: Please keep the boxes that  topical medications come in in order to help keep track of the instructions about where and how to use these. Pharmacies typically print the medication instructions only on the boxes and not directly on the medication tubes.   If your medication is too expensive, please contact our office at [phone] option 4 or send [place] a message through MyChart.   We are unable to tell what your co-pay for medications will be in advance as this is different depending on your insurance coverage. However, we may be able to find a substitute medication at lower cost or fill out paperwork to get insurance to cover a needed medication.   If a prior authorization is required to get your medication covered by your insurance company, please allow [place] 1-2 business days to complete this process.  Drug prices often vary depending on where the prescription is filled and some pharmacies may offer cheaper prices.  The website www.goodrx.com contains coupons for medications through different pharmacies. The prices here do not account for what the cost may be with help from insurance (it may be cheaper with your insurance), but the website can give you the price if you did not use any insurance.  - You can print the associated coupon and take it with your prescription to the pharmacy.  - You may also stop by our office during regular business hours and pick up a GoodRx coupon card.  - If you need your prescription sent electronically to a different pharmacy, notify our office through [facility] or by phone at [phone] option 4.     Si Usted Necesita Algo Despus de Su Visita  Tambin puede enviarnos un mensaje a travs de Clinical cytogeneticist. Por lo general respondemos a los mensajes de MyChart en el transcurso de 1 a 2 das hbiles.  Para renovar recetas, por favor pida a su farmacia que se ponga en contacto con nuestra oficina. Annie Sable de fax es [place] [phone].  Si tiene un asunto urgente cuando la clnica  est cerrada y que no puede esperar hasta el siguiente da hbil, puede llamar/localizar a su doctor(a) al nmero que aparece a continuacin.   Por favor, tenga en cuenta que aunque hacemos todo lo posible para estar disponibles para asuntos urgentes fuera del horario de [place], no estamos disponibles las 24 horas del da, los 7 [address]  [address] de la [place].   Si tiene un problema urgente y no puede comunicarse con nosotros, puede optar por buscar atencin mdica  en el consultorio de su doctor(a), en una clnica privada, en un centro de atencin urgente o en una sala de emergencias.  Si tiene Engineer, drilling, por favor llame inmediatamente al 911 o vaya a la sala de emergencias.  Nmeros de bper  - Dr. Gwen Pounds: [phone]  - Dra. Roseanne Reno: [phone]  - Dr. Katrinka Blazing: [phone]   En caso de inclemencias del  tiempo, por favor llame a [place]ne [place]e lnea principal al [phone] para una actualizacin sobre el [place] de cualquier retraso o cierre.  Consejos para la medicacin en dermatologa: Por favor, guarde las cajas en las que vienen los medicamentos de uso tpico para ayudarle a seguir las instrucciones sobre dnde y cmo usarlos. Las farmacias generalmente imprimen las instrucciones del medicamento slo en las cajas y no directamente en los tubos del [place].   Si su medicamento es muy caro, por favor, pngase en contacto con Rolm Gala llamando al [phone] y presione la opcin 4 o envenos un mensaje a travs de Clinical cytogeneticist.   No podemos decirle cul ser su copago por los medicamentos por adelantado ya que esto es diferente dependiendo de la cobertura de su seguro. Sin embargo, es posible que podamos encontrar un medicamento sustituto a Audiological scientist un formulario para que el seguro cubra el medicamento que se considera necesario.   Si se requiere una autorizacin previa para que su compaa de seguros [place] su medicamento, por favor permtanos de 1 a 2 das hbiles para  completar [address]  Los precios de los medicamentos varan con frecuencia dependiendo del Environmental consultant de dnde se surte la receta y alguna farmacias pueden ofrecer precios ms baratos.  El sitio web www.goodrx.com tiene cupones para medicamentos de Health and safety inspector. Los precios aqu no tienen en cuenta lo que podra costar con la ayuda del seguro (puede ser ms barato con su seguro), pero el sitio web puede darle el precio si no utiliz Tourist information centre manager.  - Puede imprimir el cupn correspondiente y llevarlo con su receta a la farmacia.  - Tambin puede pasar por nuestra oficina durante el horario de atencin regular y Education officer, museum una tarjeta de cupones de GoodRx.  - Si necesita que su receta se enve electrnicamente a una farmacia diferente, informe a nuestra oficina a travs de MyChart de [place] o por telfono llamando al [phone] y presione la opcin 4.

## 2023-04-19 NOTE — Progress Notes (Signed)
Follow-Up Visit   Subjective  Hannah Rocha is a 58 y.o. female who presents for the following: Skin Cancer Screening and Full Body Skin Exam.   The patient presents for Total-Body Skin Exam (TBSE) for skin cancer screening and mole check. The patient has spots, moles and lesions to be evaluated, some may be new or changing. She has a red spot on her toe that she noticed a few days ago. History of dysplastic nevi. No history of skin cancer.     The following portions of the chart were reviewed this encounter and updated as appropriate: medications, allergies, medical history  Review of Systems:  No other skin or systemic complaints except as noted in HPI or Assessment and Plan.  Objective  Well appearing patient in no apparent distress; mood and affect are within normal limits.  A full examination was performed including scalp, head, eyes, ears, nose, lips, neck, chest, axillae, abdomen, back, buttocks, bilateral upper extremities, bilateral lower extremities, hands, feet, fingers, toes, fingernails, and toenails. All findings within normal limits unless otherwise noted below.   Relevant physical exam findings are noted in the Assessment and Plan.             Assessment & Plan   SKIN CANCER SCREENING PERFORMED TODAY.  ACTINIC DAMAGE - Chronic condition, secondary to cumulative UV/sun exposure - diffuse scaly erythematous macules with underlying dyspigmentation - Recommend daily broad spectrum sunscreen SPF 30+ to sun-exposed areas, reapply every 2 hours as needed.  - Staying in the shade or wearing long sleeves, sun glasses (UVA+UVB protection) and wide brim hats (4-inch brim around the entire circumference of the hat) are also recommended for sun protection.  - Call for new or changing lesions.  LENTIGINES, SEBORRHEIC KERATOSES, HEMANGIOMAS - Benign normal skin lesions - Benign-appearing - Call for any changes  MELANOCYTIC NEVI - Tan-brown and/or  pink-flesh-colored symmetric macules and papules - left abdomen 0.7 x 1.8 cm pink brown plaque appears as two adjacent    - left mid buttock 9 mm speckled brown macule with superior 2 mm brown macule   - left lateral buttock 8 x 5 mm brown macule appears as two adjacent    - left lower hip 3 mm medium dark brown macule    - right spinal mid upper back 4 mm pink papule with adjacent 4 mm tan macule   - left post flank 9 x 5 mm speckled tan macule - Benign appearing on exam today. Photos taken today.  - Observation - Call clinic for new or changing moles - Recommend daily use of broad spectrum spf 30+ sunscreen to sun-exposed areas.   History of Dysplastic Nevi Multiple locations see history  - No evidence of recurrence today - Recommend regular full body skin exams - Recommend daily broad spectrum sunscreen SPF 30+ to sun-exposed areas, reapply every 2 hours as needed.  - Call if any new or changing lesions are noted between office visits  Purpura - Chronic; persistent and recurrent.  Treatable, but not curable. - Violaceous macules and patches R 2nd toe (hx of trauma) - Benign - Related to trauma, age, sun damage and/or use of blood thinners, chronic use of topical and/or oral steroids - Observe - Can use OTC arnica containing moisturizer such as Dermend Bruise Formula if desired - Call for worsening or other concerns  Paronychia Exam: R great toe proximal nail fold with erythema, edema, tenderness to touch and absent cuticle.  Treatment: Recommend warm Epson salt soaks Start  mupirocin 2% oint apply BID AA until healed. Start doxycycline 100 mg take 1 po BID with food x 10 days dsp #20 0Rf. Doxycycline should be taken with food to prevent nausea. Do not lay down for 30 minutes after taking. Be cautious with sun exposure and use good sun protection while on this medication. Pregnant women should not take this medication. RTC if not improving   Telangiectasia Exam:  Blanching pink macule at nasal tip (history of bleeding if rubbed)  Treatment: Observe for changes (becoming more elevated, bleeding on its own). Discussed BBL treatment. Photo taken today.   Return in about 1 year (around 04/18/2024) for TBSE, Hx Dysplastic Nevus.  ICherlyn Labella, CMA, am acting as scribe for Willeen Niece, MD .   Documentation: I have reviewed the above documentation for accuracy and completeness, and I agree with the above.  Willeen Niece, MD

## 2023-04-25 ENCOUNTER — Other Ambulatory Visit: Payer: Self-pay | Admitting: Primary Care

## 2023-04-25 DIAGNOSIS — E1165 Type 2 diabetes mellitus with hyperglycemia: Secondary | ICD-10-CM

## 2023-04-28 DIAGNOSIS — I83892 Varicose veins of left lower extremities with other complications: Secondary | ICD-10-CM | POA: Diagnosis not present

## 2023-04-30 ENCOUNTER — Encounter: Payer: Self-pay | Admitting: Primary Care

## 2023-04-30 ENCOUNTER — Ambulatory Visit (INDEPENDENT_AMBULATORY_CARE_PROVIDER_SITE_OTHER): Payer: BC Managed Care – PPO | Admitting: Primary Care

## 2023-04-30 VITALS — BP 138/74 | HR 76 | Temp 97.9°F | Ht 64.0 in | Wt 170.0 lb

## 2023-04-30 DIAGNOSIS — F411 Generalized anxiety disorder: Secondary | ICD-10-CM

## 2023-04-30 DIAGNOSIS — Z7984 Long term (current) use of oral hypoglycemic drugs: Secondary | ICD-10-CM

## 2023-04-30 DIAGNOSIS — E1165 Type 2 diabetes mellitus with hyperglycemia: Secondary | ICD-10-CM | POA: Diagnosis not present

## 2023-04-30 DIAGNOSIS — Z0000 Encounter for general adult medical examination without abnormal findings: Secondary | ICD-10-CM

## 2023-04-30 DIAGNOSIS — I10 Essential (primary) hypertension: Secondary | ICD-10-CM | POA: Diagnosis not present

## 2023-04-30 DIAGNOSIS — K219 Gastro-esophageal reflux disease without esophagitis: Secondary | ICD-10-CM

## 2023-04-30 DIAGNOSIS — E785 Hyperlipidemia, unspecified: Secondary | ICD-10-CM

## 2023-04-30 LAB — LIPID PANEL
Cholesterol: 110 mg/dL (ref 0–200)
HDL: 42.1 mg/dL (ref >39.00)
LDL Cholesterol: 55 mg/dL (ref 0–99)
NonHDL: 68.35
Total CHOL/HDL Ratio: 3
Triglycerides: 67 mg/dL (ref 0.0–149.0)
VLDL: 13.4 mg/dL (ref 0.0–40.0)

## 2023-04-30 LAB — COMPREHENSIVE METABOLIC PANEL
ALT: 13 U/L (ref 0–35)
AST: 15 U/L (ref 0–37)
Albumin: 4.3 g/dL (ref 3.5–5.2)
Alkaline Phosphatase: 85 U/L (ref 39–117)
BUN: 14 mg/dL (ref 6–23)
CO2: 30 meq/L (ref 19–32)
Calcium: 9.3 mg/dL (ref 8.4–10.5)
Chloride: 98 meq/L (ref 96–112)
Creatinine, Ser: 0.72 mg/dL (ref 0.40–1.20)
GFR: 91.76 mL/min (ref >60.00)
Glucose, Bld: 104 mg/dL — ABNORMAL HIGH (ref 70–99)
Potassium: 4.5 meq/L (ref 3.5–5.1)
Sodium: 135 meq/L (ref 135–145)
Total Bilirubin: 0.4 mg/dL (ref 0.2–1.2)
Total Protein: 6.6 g/dL (ref 6.0–8.3)

## 2023-04-30 LAB — HEMOGLOBIN A1C: Hgb A1c MFr Bld: 7.1 % — ABNORMAL HIGH (ref 4.6–6.5)

## 2023-04-30 NOTE — Progress Notes (Signed)
 Subjective:    Patient ID: Hannah Rocha, female    DOB: [birth year], 58 y.o.   MRN: [id number]  HPI  Hannah Rocha is a very pleasant 58 y.o. female who presents today for complete physical and follow up of chronic conditions.   Immunizations: -Tetanus: Completed in 2019 -Influenza: Completed this season  -Shingles: Completed Shingrix  series -Pneumonia: Completed in 2019  Diet: Fair diet.  Exercise: Walking daily or twice daily   Eye exam: Completes annually  Dental exam: Completes semi-annually    Pap Smear: Hysterectomy Mammogram: Completed in September 2024  Colonoscopy: Completed in 2024, due 2029  BP Readings from Last 3 Encounters:  04/30/23 138/74  03/08/23 124/78  01/22/23 138/80       Review of Systems  Constitutional:  Negative for unexpected weight change.  HENT:  Negative for rhinorrhea.   Respiratory:  Negative for cough and shortness of breath.   Cardiovascular:  Negative for chest pain.  Gastrointestinal:  Positive for constipation. Negative for diarrhea.  Genitourinary:  Negative for difficulty urinating.  Musculoskeletal:  Negative for arthralgias and myalgias.  Skin:  Negative for rash.  Allergic/Immunologic: Negative for environmental allergies.  Neurological:  Negative for dizziness, numbness and headaches.  Psychiatric/Behavioral:  The patient is not nervous/anxious.          Past Medical History:  Diagnosis Date   Allergy    Anemia    hx   Anxiety    CARDIAC MURMUR, HX OF 05/03/2007   Annotation: ASD Qualifier: Diagnosis of  By: Bartley MD, Lamar Mulch    Cataract    FLOATING CATARACT    COVID-19 02/07/2021   De Quervain's tenosynovitis, right 03/29/2019   Diabetes mellitus without complication (HCC)    Dysplastic nevus 01/14/2015   right spinal mid back, severe, excised 03/04/2015   Dysplastic nevus 02/08/2018   right upper back/post shoulder, moderate   Dysplastic nevus 02/08/2018   left spinal mid  upper back, severe, excised 03/21/2018   Fluttering sensation of heart 06/25/2021   Generalized abdominal pain 09/19/2021   GERD (gastroesophageal reflux disease)    H/O hiatal hernia    Heart murmur    ASD repair 1999- no meds    HIATAL HERNIA 07/09/2008   Qualifier: Diagnosis of  By: Kowalk CMA (AAMA), Leisha     Hx of dysplastic nevus    multiple sites, some severe   Hyperlipidemia    IBS 06/21/2009   Qualifier: Diagnosis of  By: Jakie MD NOLIA Alm SAUNDERS    MENOPAUSAL SYNDROME 01/15/2010   Qualifier: Diagnosis of  By: Jenetta MD, Talia     Muscle tightness 09/19/2021   Nausea vomiting and diarrhea 07/03/2022   Obesity    Plant allergic contact dermatitis 10/13/2018   PONV (postoperative nausea and vomiting)    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   Annotation: REPAIRED AS CHILD Qualifier: Diagnosis of  By: Bartley MD, Lamar Mulch    Viral URI with cough 11/30/2012    Social History   Socioeconomic History   Marital status: Married    Spouse name: Not on file   Number of children: 0   Years of education: Not on file   Highest education level: Not on file  Occupational History   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    Employer: [employer]  Tobacco Use   Smoking status: Former    Current packs/day: 0.00    Types: Cigarettes    Quit date: 02/21/1987    Years since  quitting: 36.2   Smokeless tobacco: Never   Tobacco comments:    22 years ago-light smoker  Vaping Use   Vaping status: Never Used  Substance and Sexual Activity   Alcohol use: Yes    Alcohol/week: 0.0 standard drinks of alcohol    Comment: occasional - hard apple cider, shot   Drug use: No   Sexual activity: Not Currently    Partners: Male    Birth control/protection: Surgical    Comment: tubaligation/hysterctomy  Other Topics Concern   Not on file  Social History Narrative   Daily caffeine use   Social Drivers of Health   Financial Resource Strain: Not on file  Food  Insecurity: Not on file  Transportation Needs: Not on file  Physical Activity: Not on file  Stress: Not on file  Social Connections: Not on file  Intimate Partner Violence: Not on file    Past Surgical History:  Procedure Laterality Date   ASD REPAIR  1999   [facility]   BREAST CYST ASPIRATION Right 2013   Carpal tunnel repair Right 11/27/2022   CATARACT EXTRACTION Bilateral    Right done September 2023 and Left January 08 2022   CATARACT EXTRACTION Bilateral 12/21/2021   CLEFT LIP REPAIR     COLONOSCOPY     COLONOSCOPY WITH PROPOFOL  N/A 12/31/2022   Procedure: COLONOSCOPY WITH PROPOFOL ;  Surgeon: Aneita Gwendlyn DASEN, MD;  Location: [facility];  Service: Gastroenterology;  Laterality: N/A;   LAPAROSCOPIC ASSISTED VAGINAL HYSTERECTOMY Bilateral 04/13/2013   Procedure: LAPAROSCOPIC ASSISTED VAGINAL HYSTERECTOMY;  Surgeon: Harland JAYSON Birkenhead, MD;  Location: [facility];  Service: Gynecology;  Laterality: Bilateral;- pt states was abdominal hysterectomy    LAPAROSCOPIC TUBAL LIGATION  03/21/2012   Procedure: LAPAROSCOPIC TUBAL LIGATION;  Surgeon: Harland JAYSON Birkenhead, MD;  Location: [facility];  Service: Gynecology;  Laterality: Bilateral;   MANDIBLE FRACTURE SURGERY     Plantar Fascitis Repair Bilateral 01/2015    Family History  Problem Relation Age of Onset   Heart disease Mother    Colon polyps Mother    Diabetes Mother    Prostate cancer Father    Heart disease Father    Cancer Father        prostate   Diabetes Maternal Aunt    Diabetes Paternal Grandmother    Fibromyalgia Sister    Colon cancer Neg Hx    Breast cancer Neg Hx    Esophageal cancer Neg Hx    Rectal cancer Neg Hx    Stomach cancer Neg Hx     Allergies  Allergen Reactions   Sertraline Hcl     REACTION: vomiting, severe anxiety    Current Outpatient Medications on File Prior to Visit  Medication Sig Dispense Refill   acetaminophen  (TYLENOL ) 500 MG tablet Take 500 mg by mouth every 6 (six) hours as needed for mild pain.      B  Complex Vitamins (VITAMIN B COMPLEX PO) Take 1 tablet by mouth daily.     busPIRone  (BUSPAR ) 30 MG tablet TAKE 1 TABLET BY MOUTH EVERY MORNING 1/2 TAB AT LUNCH, AND 1/2 TAB AT BEDTIME FOR ANXIETY. 180 tablet 0   escitalopram  (LEXAPRO ) 10 MG tablet TAKE 1 TABLET (10 MG TOTAL) BY MOUTH DAILY. FOR ANXIETY. 90 tablet 0   famotidine  (PEPCID ) 20 MG tablet Take 1 tablet (20 mg total) by mouth at bedtime as needed for heartburn or indigestion. for heartburn. 90 tablet 0   flintstones complete (FLINTSTONES) 60 MG chewable tablet Chew 1 tablet by  mouth daily.     fluticasone  (FLONASE ) 50 MCG/ACT nasal spray Place 2 sprays into both nostrils daily. 16 g 6   ibuprofen  (ADVIL ) 800 MG tablet Take 1 tablet (800 mg total) by mouth every 8 (eight) hours as needed. 60 tablet 3   losartan -hydrochlorothiazide  (HYZAAR) 100-12.5 MG tablet TAKE 1 TABLET BY MOUTH EVERY DAY 90 tablet 3   metFORMIN  (GLUCOPHAGE -XR) 500 MG 24 hr tablet TAKE 2 TABLETS (1,000 MG TOTAL) BY MOUTH DAILY WITH BREAKFAST. FOR DIABETES. 180 tablet 1   pantoprazole  (PROTONIX ) 40 MG tablet Take 1 tablet (40 mg total) by mouth daily. 90 tablet 3   rosuvastatin  (CRESTOR ) 20 MG tablet TAKE 1 TABLET BY MOUTH DAILY. FOR CHOLESTEROL 90 tablet 3   RYBELSUS  7 MG TABS TAKE 1 TABLET (7 MG TOTAL) BY MOUTH DAILY. FOR DIABETES. 90 tablet 0   mupirocin  ointment (BACTROBAN ) 2 % Apply to affected area toe twice daily until improved. (Patient not taking: Reported on 04/30/2023) 22 g 0   No current facility-administered medications on file prior to visit.    BP 138/74   Pulse 76   Temp 97.9 F (36.6 C) (Temporal)   Ht 5' 4 (1.626 m)   Wt 170 lb (77.1 kg)   LMP 03/24/2013   SpO2 98%   BMI 29.18 kg/m  Objective:   Physical Exam HENT:     Right Ear: Tympanic membrane and ear canal normal.     Left Ear: Tympanic membrane and ear canal normal.  Eyes:     Pupils: Pupils are equal, round, and reactive to light.  Cardiovascular:     Rate and Rhythm: Normal rate  and regular rhythm.  Pulmonary:     Effort: Pulmonary effort is normal.     Breath sounds: Normal breath sounds.  Abdominal:     General: Bowel sounds are normal.     Palpations: Abdomen is soft.     Tenderness: There is no abdominal tenderness.  Musculoskeletal:        General: Normal range of motion.     Cervical back: Neck supple.  Skin:    General: Skin is warm and dry.     Findings: Erythema present.     Comments: Erythema to left great toe along nail border  Neurological:     Mental Status: She is alert and oriented to person, place, and time.     Cranial Nerves: No cranial nerve deficit.     Deep Tendon Reflexes:     Reflex Scores:      Patellar reflexes are 2+ on the right side and 2+ on the left side. Psychiatric:        Mood and Affect: Mood normal.           Assessment & Plan:  Essential hypertension Assessment & Plan: Stable.  Continue losartan -hydrochlorothiazide  100-12.5 mg daily. CMP pending   Gastroesophageal reflux disease without esophagitis Assessment & Plan: Controlled.  Continue pantoprazole  40 mg daily and famotidine  20 mg HS.   Type 2 diabetes mellitus with hyperglycemia, without long-term current use of insulin (HCC) Assessment & Plan: Repeat A1C pending.  Discussed the importance of a healthy diet and regular exercise in order for weight loss, and to reduce the risk of further co-morbidity.. Continue metformin  ER 1000 mg daily, Rybelsus  7 mg daily.   Urine microalbumin due and pending.  Follow up in 3-6 months.  Orders: -     Microalbumin / creatinine urine ratio -     Hemoglobin A1c  Hyperlipidemia,  unspecified hyperlipidemia type Assessment & Plan: Repeat lipid panel pending.  Continue rosuvastatin  20 mg daily.  Orders: -     Lipid panel -     Comprehensive metabolic panel  GAD (generalized anxiety disorder) Assessment & Plan: Controlled.  Continue buspirone  30 mg in the a.m., 50 mg at lunch and 50 mg at  bedtime. Continue Lexapro  10 mg daily.   Preventative health care Assessment & Plan: Immunizations UTD. Mammogram UTD Colonoscopy UTD, due 2029  Discussed the importance of a healthy diet and regular exercise in order for weight loss, and to reduce the risk of further co-morbidity.  Exam stable. Labs pending.  Follow up in 1 year for repeat physical.          Antwione Picotte K Montey Ebel, NP

## 2023-04-30 NOTE — Assessment & Plan Note (Signed)
 Controlled.  Continue pantoprazole  40 mg daily and famotidine  20 mg HS.

## 2023-04-30 NOTE — Patient Instructions (Signed)
 Stop by the lab prior to leaving today. I will notify you of your results once received.   Please schedule a follow up visit for 6 months for a diabetes check.  It was a pleasure to see you today!

## 2023-04-30 NOTE — Assessment & Plan Note (Signed)
 Repeat A1C pending.  Discussed the importance of a healthy diet and regular exercise in order for weight loss, and to reduce the risk of further co-morbidity.. Continue metformin  ER 1000 mg daily, Rybelsus  7 mg daily.   Urine microalbumin due and pending.  Follow up in 3-6 months.

## 2023-04-30 NOTE — Assessment & Plan Note (Signed)
 Repeat lipid panel pending. ? ?Continue rosuvastatin 20 mg daily. ?

## 2023-04-30 NOTE — Assessment & Plan Note (Signed)
 Stable.  Continue losartan -hydrochlorothiazide  100-12.5 mg daily. CMP pending

## 2023-04-30 NOTE — Assessment & Plan Note (Signed)
Immunizations UTD.  Mammogram UTD Colonoscopy UTD, due 2029  Discussed the importance of a healthy diet and regular exercise in order for weight loss, and to reduce the risk of further co-morbidity.  Exam stable. Labs pending.  Follow up in 1 year for repeat physical.

## 2023-04-30 NOTE — Assessment & Plan Note (Signed)
 Controlled.  Continue buspirone  30 mg in the a.m., 50 mg at lunch and 50 mg at bedtime. Continue Lexapro  10 mg daily.

## 2023-05-06 LAB — MICROALBUMIN / CREATININE URINE RATIO
Creatinine,U: 95.8 mg/dL
Microalb Creat Ratio: 7 mg/g (ref 0.0–30.0)
Microalb, Ur: <0.7 mg/dL (ref 0.0–1.9)

## 2023-06-01 ENCOUNTER — Ambulatory Visit: Admitting: Family Medicine

## 2023-06-01 ENCOUNTER — Encounter: Payer: Self-pay | Admitting: Family Medicine

## 2023-06-01 VITALS — BP 139/85 | HR 74 | Temp 98.3°F | Ht 64.0 in | Wt 173.4 lb

## 2023-06-01 DIAGNOSIS — M7989 Other specified soft tissue disorders: Secondary | ICD-10-CM | POA: Diagnosis not present

## 2023-06-01 DIAGNOSIS — I8312 Varicose veins of left lower extremity with inflammation: Secondary | ICD-10-CM

## 2023-06-01 NOTE — Assessment & Plan Note (Addendum)
 LLE  medial / with some tenderness and a palpable knot (1 cm)  below knee No erythema  Mild warmth  Has history of varicosities   She does go to vein clinic in [place]   LLE doppler ordered  Suspect phlebitis   Instructed to elevate Wear supp hose if not too uncomfortable Warm compress   Call back and Er precautions noted in detail today

## 2023-06-01 NOTE — Patient Instructions (Signed)
 I placed an order for ultrasound of your leg Wear your compression hose if not too uncomfortable Elevate with sitting Use a warm compress when able    If symptoms suddenly worsen or progress over the knee let us know

## 2023-06-01 NOTE — Progress Notes (Signed)
 Subjective:    Patient ID: Hannah Rocha, female    DOB: Dec 28, 1965, 58 y.o.   MRN: [id number]  HPI  Wt Readings from Last 3 Encounters:  06/01/23 173 lb 6 oz (78.6 kg)  04/30/23 170 lb (77.1 kg)  03/08/23 172 lb (78 kg)   29.76 kg/m  Vitals:   06/01/23 1356 06/01/23 1412  BP: (!) 158/96 139/85  Pulse: 74   Temp: 98.3 F (36.8 C)   SpO2: 100%     58 yo pt of NP Clark presents with knot on left leg  History of HTN and varicose veins and DM  Knot over medial shin  Was a little more swollen over weekend Was tender on Friday /not now  Was warm  Not throbbing or painful if she is still   Has varicose veins  Had laser ablation not overly long ago  Usually wears support stockings   Feels ok  No fever   No new exercise  Walking is her exercise    HTN bp is stable today  No cp or palpitations or headaches or edema  No side effects to medicines  BP Readings from Last 3 Encounters:  06/01/23 139/85  04/30/23 138/74  03/08/23 124/78     Losartan hct 100-12.5 mg daily  Ate salty lunch   Patient Active Problem List   Diagnosis Date Noted   Leg swelling 06/01/2023   Sensation of fullness in both ears 02/23/2023   Hx of adenomatous colonic polyps 12/31/2022   History of atrial septal defect repair 06/25/2021   Preventative health care 04/23/2021   Carpal tunnel syndrome 03/29/2019   HLD (hyperlipidemia) 03/09/2016   Type 2 diabetes mellitus with hyperglycemia (HCC) 03/09/2016   Vitamin D deficiency 03/09/2016   ETD (eustachian tube dysfunction) 05/15/2015   Varicosities of leg 12/28/2013   Essential hypertension 04/04/2012   GAD (generalized anxiety disorder) 04/04/2012   GERD (gastroesophageal reflux disease) 01/23/2011   ANEMIA-IRON DEFICIENCY 06/21/2009   IRON DEFICIENCY ANEMIA, HX OF 07/10/2008   ATRIAL SEPTAL DEFECT, SECUNDUM TYPE 05/03/2007   Past Medical History:  Diagnosis Date   Allergy    Anemia    hx   Anxiety    CARDIAC  MURMUR, HX OF 05/03/2007   Annotation: ASD Qualifier: Diagnosis of  By: Hetty Ely MD, Franne Grip    Cataract    FLOATING CATARACT    COVID-19 02/07/2021   De Quervain's tenosynovitis, right 03/29/2019   Diabetes mellitus without complication (HCC)    Dysplastic nevus 01/14/2015   right spinal mid back, severe, excised 03/04/2015   Dysplastic nevus 02/08/2018   right upper back/post shoulder, moderate   Dysplastic nevus 02/08/2018   left spinal mid upper back, severe, excised 03/21/2018   Fluttering sensation of heart 06/25/2021   Generalized abdominal pain 09/19/2021   GERD (gastroesophageal reflux disease)    H/O hiatal hernia    Heart murmur    ASD repair 1999- no meds    HIATAL HERNIA 07/09/2008   Qualifier: Diagnosis of  By: Koleen Distance CMA (AAMA), Leisha     Hx of dysplastic nevus    multiple sites, some severe   Hyperlipidemia    IBS 06/21/2009   Qualifier: Diagnosis of  By: Jarold Motto MD Lang Snow    MENOPAUSAL SYNDROME 01/15/2010   Qualifier: Diagnosis of  By: Dayton Martes MD, Talia     Muscle tightness 09/19/2021   Nausea vomiting and diarrhea 07/03/2022   Obesity    Plant allergic contact dermatitis 10/13/2018  PONV (postoperative nausea and vomiting)    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   Annotation: REPAIRED AS CHILD Qualifier: Diagnosis of  By: Hetty Ely MD, Franne Grip    Viral URI with cough 11/30/2012   Past Surgical History:  Procedure Laterality Date   ASD REPAIR  1999   [facility]   BREAST CYST ASPIRATION Right 2013   Carpal tunnel repair Right 11/27/2022   CATARACT EXTRACTION Bilateral    Right done September 2023 and Left January 08 2022   CATARACT EXTRACTION Bilateral 12/21/2021   CLEFT LIP REPAIR     COLONOSCOPY     COLONOSCOPY WITH PROPOFOL N/A 12/31/2022   Procedure: COLONOSCOPY WITH PROPOFOL;  Surgeon: Meryl Dare, MD;  Location: [facility];  Service: Gastroenterology;  Laterality: N/A;   LAPAROSCOPIC ASSISTED VAGINAL  HYSTERECTOMY Bilateral 04/13/2013   Procedure: LAPAROSCOPIC ASSISTED VAGINAL HYSTERECTOMY;  Surgeon: Allie Bossier, MD;  Location: [facility];  Service: Gynecology;  Laterality: Bilateral;- pt states was abdominal hysterectomy    LAPAROSCOPIC TUBAL LIGATION  03/21/2012   Procedure: LAPAROSCOPIC TUBAL LIGATION;  Surgeon: Allie Bossier, MD;  Location: [facility];  Service: Gynecology;  Laterality: Bilateral;   MANDIBLE FRACTURE SURGERY     Plantar Fascitis Repair Bilateral 01/2015   Social History   Tobacco Use   Smoking status: Former    Current packs/day: 0.00    Types: Cigarettes    Quit date: 02/21/1987    Years since quitting: 36.2   Smokeless tobacco: Never   Tobacco comments:    22 years ago-light smoker  Vaping Use   Vaping status: Never Used  Substance Use Topics   Alcohol use: Yes    Alcohol/week: 0.0 standard drinks of alcohol    Comment: occasional - hard apple cider, shot   Drug use: No   Family History  Problem Relation Age of Onset   Heart disease Mother    Colon polyps Mother    Diabetes Mother    Prostate cancer Father    Heart disease Father    Cancer Father        prostate   Diabetes Maternal Aunt    Diabetes Paternal Grandmother    Fibromyalgia Sister    Colon cancer Neg Hx    Breast cancer Neg Hx    Esophageal cancer Neg Hx    Rectal cancer Neg Hx    Stomach cancer Neg Hx    Allergies  Allergen Reactions   Sertraline Hcl     REACTION: vomiting, severe anxiety   Current Outpatient Medications on File Prior to Visit  Medication Sig Dispense Refill   acetaminophen (TYLENOL) 500 MG tablet Take 500 mg by mouth every 6 (six) hours as needed for mild pain.      B Complex Vitamins (VITAMIN B COMPLEX PO) Take 1 tablet by mouth daily.     busPIRone (BUSPAR) 30 MG tablet TAKE 1 TABLET BY MOUTH EVERY MORNING 1/2 TAB AT LUNCH, AND 1/2 TAB AT BEDTIME FOR ANXIETY. 180 tablet 0   escitalopram (LEXAPRO) 10 MG tablet TAKE 1 TABLET (10 MG TOTAL) BY MOUTH DAILY. FOR ANXIETY.  90 tablet 0   famotidine (PEPCID) 20 MG tablet Take 1 tablet (20 mg total) by mouth at bedtime as needed for heartburn or indigestion. for heartburn. 90 tablet 0   flintstones complete (FLINTSTONES) 60 MG chewable tablet Chew 1 tablet by mouth daily.     fluticasone (FLONASE) 50 MCG/ACT nasal spray Place 2 sprays into both nostrils daily. 16 g  6   ibuprofen (ADVIL) 800 MG tablet Take 1 tablet (800 mg total) by mouth every 8 (eight) hours as needed. 60 tablet 3   losartan-hydrochlorothiazide (HYZAAR) 100-12.5 MG tablet TAKE 1 TABLET BY MOUTH EVERY DAY 90 tablet 3   metFORMIN (GLUCOPHAGE-XR) 500 MG 24 hr tablet TAKE 2 TABLETS (1,000 MG TOTAL) BY MOUTH DAILY WITH BREAKFAST. FOR DIABETES. 180 tablet 1   mupirocin ointment (BACTROBAN) 2 % Apply to affected area toe twice daily until improved. 22 g 0   pantoprazole (PROTONIX) 40 MG tablet Take 1 tablet (40 mg total) by mouth daily. 90 tablet 3   rosuvastatin (CRESTOR) 20 MG tablet TAKE 1 TABLET BY MOUTH DAILY. FOR CHOLESTEROL 90 tablet 3   RYBELSUS 7 MG TABS TAKE 1 TABLET (7 MG TOTAL) BY MOUTH DAILY. FOR DIABETES. 90 tablet 0   No current facility-administered medications on file prior to visit.    Review of Systems  Constitutional:  Negative for fatigue and fever.  Respiratory:  Negative for cough, chest tightness, shortness of breath and wheezing.   Cardiovascular:  Negative for chest pain, palpitations and leg swelling.  Musculoskeletal:        Lump on left lower leg Not painful   Neurological:  Negative for dizziness.       Objective:   Physical Exam Constitutional:      General: She is not in acute distress.    Appearance: Normal appearance. She is well-developed. She is not ill-appearing or diaphoretic.     Comments: Overwt   HENT:     Head: Normocephalic and atraumatic.     Mouth/Throat:     Mouth: Mucous membranes are moist.  Eyes:     Conjunctiva/sclera: Conjunctivae normal.     Pupils: Pupils are equal, round, and reactive  to light.  Neck:     Thyroid: No thyromegaly.     Vascular: No carotid bruit or JVD.  Cardiovascular:     Rate and Rhythm: Normal rate and regular rhythm.     Heart sounds: Normal heart sounds.     No gallop.     Comments: LE varicosities noted  Pulmonary:     Effort: Pulmonary effort is normal. No respiratory distress.     Breath sounds: Normal breath sounds. No wheezing or rales.  Abdominal:     General: There is no distension or abdominal bruit.     Palpations: Abdomen is soft.  Musculoskeletal:     Cervical back: Normal range of motion and neck supple.     Right lower leg: No edema.     Left lower leg: Edema present.     Comments: LLE below knee/ medial  1 cm knot in soft tissue  Swelling surrounds it  Mildly tender  Mildly warm Neg homan's   Normal perfusion of feet   Lymphadenopathy:     Cervical: No cervical adenopathy.  Skin:    General: Skin is warm and dry.     Coloration: Skin is not jaundiced or pale.     Findings: No bruising or rash.  Neurological:     Mental Status: She is alert.     Coordination: Coordination normal.     Deep Tendon Reflexes: Reflexes are normal and symmetric. Reflexes normal.  Psychiatric:        Mood and Affect: Mood normal.           Assessment & Plan:   Problem List Items Addressed This Visit       Cardiovascular and Mediastinum  Varicosities of leg   Relevant Orders   VAS [place] LOWER EXTREMITY VENOUS (DVT)     Other   Leg swelling - Primary   LLE  medial / with some tenderness and a palpable knot (1 cm)  below knee No erythema  Mild warmth  Has history of varicosities   She does go to vein clinic in [place]   LLE doppler ordered  Suspect phlebitis   Instructed to elevate Wear supp hose if not too uncomfortable Warm compress   Call back and Er precautions noted in detail today         Relevant Orders   VAS [place] LOWER EXTREMITY VENOUS (DVT)

## 2023-06-02 ENCOUNTER — Ambulatory Visit
Admission: RE | Admit: 2023-06-02 | Discharge: 2023-06-02 | Discharge status: Home / Self Care | Source: Ambulatory Visit | Attending: Family Medicine | Admitting: Family Medicine

## 2023-06-02 ENCOUNTER — Encounter: Payer: Self-pay | Admitting: Family Medicine

## 2023-06-02 DIAGNOSIS — M79605 Pain in left leg: Secondary | ICD-10-CM | POA: Diagnosis not present

## 2023-06-02 DIAGNOSIS — R600 Localized edema: Secondary | ICD-10-CM | POA: Diagnosis not present

## 2023-06-02 DIAGNOSIS — M7989 Other specified soft tissue disorders: Secondary | ICD-10-CM

## 2023-06-02 DIAGNOSIS — I8312 Varicose veins of left lower extremity with inflammation: Secondary | ICD-10-CM

## 2023-06-12 ENCOUNTER — Other Ambulatory Visit: Payer: Self-pay | Admitting: Primary Care

## 2023-06-12 DIAGNOSIS — F411 Generalized anxiety disorder: Secondary | ICD-10-CM

## 2023-06-21 ENCOUNTER — Ambulatory Visit: Admitting: Nurse Practitioner

## 2023-06-21 VITALS — BP 138/72 | HR 67 | Temp 98.1°F | Ht 64.0 in | Wt 174.0 lb

## 2023-06-21 DIAGNOSIS — L03031 Cellulitis of right toe: Secondary | ICD-10-CM | POA: Insufficient documentation

## 2023-06-21 MED ORDER — SULFAMETHOXAZOLE-TRIMETHOPRIM 800-160 MG PO TABS
1.0000 | ORAL_TABLET | Freq: Two times a day (BID) | ORAL | 0 refills | Status: AC
Start: 2023-06-21 — End: ?

## 2023-06-21 NOTE — Patient Instructions (Signed)
 Nice to see you today Take the antibiotics as prescribed If it does not Improve let us know

## 2023-06-21 NOTE — Assessment & Plan Note (Signed)
 Will treat with Bactrim DS 1 tab twice daily for 7 days.  Did offer I&D in office.  Patient politely declined.  Mild fluctuance but not overly full.  Patient can also use warm Epsom salt soaks.  This is the second time this has happened if it happens again consider podiatry referral to see if there is a reason for recurrent paronychia.

## 2023-06-21 NOTE — Progress Notes (Signed)
   Acute Office Visit  Subjective:     Patient ID: Hannah Rocha, female    DOB: 1965-10-21, 58 y.o.   MRN: [id number]  Chief Complaint  Patient presents with   Toe Pain    Pt complains of R big toe that appears as red and swollen. Ongoing for about 2 weeks. Pt states of no itching but soreness. Slight pus. Pt was prescribed Bactroben ointment by derm. Pt states cream did not work. States its 2nd time this has happened     Patient is in today for show complaint HTN, DM2, GERD, IDA, HLD, Atrial septal defect with repair  Symptom are the right toe and has been going on for approx 2 weeks. She has been seen by dermatology and was prescribed Bactroban cream that was not effective. States that she saw then in jan/ feb and was given cream and antibiotic that cleared up. States that she has not tried anything otc.    Review of Systems  Constitutional:  Negative for chills and fever.  Respiratory:  Negative for shortness of breath.   Cardiovascular:  Negative for chest pain.  Neurological:  Negative for headaches.  Psychiatric/Behavioral:  Negative for hallucinations and suicidal ideas.         Objective:    BP 138/72   Pulse 67   Temp 98.1 F (36.7 C) (Oral)   Ht 5\' 4"  (1.626 m)   Wt 174 lb (78.9 kg)   LMP 03/24/2013   SpO2 99%   BMI 29.87 kg/m    Physical Exam Vitals and nursing note reviewed.  Constitutional:      Appearance: Normal appearance.  Cardiovascular:     Rate and Rhythm: Normal rate and regular rhythm.     Heart sounds: Normal heart sounds.  Pulmonary:     Effort: Pulmonary effort is normal.     Breath sounds: Normal breath sounds.  Musculoskeletal:       Feet:  Feet:     Comments: Erythema, edema and mild fluctuance  Neurological:     Mental Status: She is alert.     No results found for any visits on 06/21/23.      Assessment & Plan:   Problem List Items Addressed This Visit       Musculoskeletal and Integument   Paronychia of great toe  of right foot - Primary   Will treat with Bactrim DS 1 tab twice daily for 7 days.  Did offer I&D in office.  Patient politely declined.  Mild fluctuance but not overly full.  Patient can also use warm Epsom salt soaks.  This is the second time this has happened if it happens again consider podiatry referral to see if there is a reason for recurrent paronychia.      Relevant Medications   sulfamethoxazole-trimethoprim (BACTRIM DS) 800-160 MG tablet    Meds ordered this encounter  Medications   sulfamethoxazole-trimethoprim (BACTRIM DS) 800-160 MG tablet    Sig: Take 1 tablet by mouth 2 (two) times daily.    Dispense:  14 tablet    Refill:  0    Supervising Provider:   Roxy Manns A [[id number]]    Return if symptoms worsen or fail to improve.  Audria Nine, NP

## 2023-06-23 ENCOUNTER — Other Ambulatory Visit: Payer: Self-pay | Admitting: Primary Care

## 2023-06-23 ENCOUNTER — Ambulatory Visit: Admitting: Family Medicine

## 2023-06-23 ENCOUNTER — Other Ambulatory Visit (HOSPITAL_COMMUNITY)
Admission: RE | Admit: 2023-06-23 | Discharge: 2023-06-23 | Disposition: A | Discharge status: Home / Self Care | Source: Ambulatory Visit | Attending: Family Medicine | Admitting: Family Medicine

## 2023-06-23 VITALS — BP 149/74 | HR 77 | Ht 64.0 in | Wt 171.2 lb

## 2023-06-23 DIAGNOSIS — N898 Other specified noninflammatory disorders of vagina: Secondary | ICD-10-CM | POA: Insufficient documentation

## 2023-06-23 DIAGNOSIS — K219 Gastro-esophageal reflux disease without esophagitis: Secondary | ICD-10-CM

## 2023-06-23 NOTE — Progress Notes (Signed)
 CC: Vaginal discharge x2 months, denies any itching    Started Bactrim also yesterday   reoccurring rash under breast- doesn't have currently

## 2023-06-23 NOTE — Patient Instructions (Signed)
 Miconazole - anti-fungal powder for under breasts

## 2023-06-23 NOTE — Progress Notes (Signed)
   Subjective:    Patient ID: Hannah Rocha is a 58 y.o. female presenting with GYN visit   on 06/23/2023  HPI: Having vaginal discharge. No odor, no irritation of itching. Has not tried anything for it.   Review of Systems  Constitutional:  Negative for chills and fever.  Respiratory:  Negative for shortness of breath.   Cardiovascular:  Negative for chest pain.  Gastrointestinal:  Negative for abdominal pain, nausea and vomiting.  Genitourinary:  Negative for dysuria.  Skin:  Negative for rash.      Objective:    BP (!) 149/74   Pulse 77   Ht 5\' 4"  (1.626 m)   Wt 171 lb 3.2 oz (77.7 kg)   LMP 03/24/2013   BMI 29.39 kg/m  Physical Exam Exam conducted with a chaperone present.  Constitutional:      General: She is not in acute distress.    Appearance: She is well-developed.  HENT:     Head: Normocephalic and atraumatic.  Eyes:     General: No scleral icterus. Neck:     Thyroid: No thyromegaly.  Cardiovascular:     Rate and Rhythm: Normal rate.  Pulmonary:     Effort: Pulmonary effort is normal.  Abdominal:     Palpations: Abdomen is soft.  Genitourinary:    Vagina: Normal.     Comments: Uterus and cervix surgically absent. Thick brown/green discharge noted. Musculoskeletal:     Cervical back: Neck supple.  Skin:    General: Skin is warm and dry.  Neurological:     Mental Status: She is alert and oriented to person, place, and time.       Assessment & Plan:  Vaginal discharge - wet prep sent and treat per results. - Plan: Cervicovaginal ancillary only  Return if symptoms worsen or fail to improve.  Reva Bores, MD 06/23/2023 1:57 PM

## 2023-06-24 LAB — CERVICOVAGINAL ANCILLARY ONLY
Bacterial Vaginitis (gardnerella): NEGATIVE
Candida Glabrata: NEGATIVE
Candida Vaginitis: NEGATIVE
Comment: NEGATIVE
Comment: NEGATIVE
Comment: NEGATIVE
Comment: NEGATIVE
Trichomonas: NEGATIVE

## 2023-07-08 DIAGNOSIS — R252 Cramp and spasm: Secondary | ICD-10-CM | POA: Diagnosis not present

## 2023-07-08 DIAGNOSIS — I87392 Chronic venous hypertension (idiopathic) with other complications of left lower extremity: Secondary | ICD-10-CM | POA: Diagnosis not present

## 2023-07-08 DIAGNOSIS — I872 Venous insufficiency (chronic) (peripheral): Secondary | ICD-10-CM | POA: Diagnosis not present

## 2023-07-08 DIAGNOSIS — L299 Pruritus, unspecified: Secondary | ICD-10-CM | POA: Diagnosis not present

## 2023-07-08 DIAGNOSIS — R600 Localized edema: Secondary | ICD-10-CM | POA: Diagnosis not present

## 2023-07-10 ENCOUNTER — Other Ambulatory Visit: Payer: Self-pay | Admitting: Primary Care

## 2023-07-10 DIAGNOSIS — F411 Generalized anxiety disorder: Secondary | ICD-10-CM

## 2023-07-28 ENCOUNTER — Ambulatory Visit: Admitting: Nurse Practitioner

## 2023-07-28 VITALS — BP 136/68 | HR 86 | Temp 97.9°F | Ht 64.0 in | Wt 171.8 lb

## 2023-07-28 DIAGNOSIS — B351 Tinea unguium: Secondary | ICD-10-CM | POA: Diagnosis not present

## 2023-07-28 NOTE — Assessment & Plan Note (Signed)
 Given the involvement of over 5 of her toes we will do terbinafine 200 mg daily pending hepatic function test today.  Patient was made aware that this medication can be hepatotoxic up to liver failure.  She does not drink alcohol advised her not to start while on this medication.  She will need monthly LFTs while on therapy.  Patient is in agreement with this.

## 2023-07-28 NOTE — Progress Notes (Signed)
   Acute Office Visit  Subjective:     Patient ID: Hannah Rocha, female    DOB: Oct 04, 1965, 58 y.o.   MRN: [id number]  Chief Complaint  Patient presents with   Foot Pain    Pt complains of possible toe fungus. Toes on both feet have brown appearance on nails. Pt states she didn't notice for a long time due to having nail polish on from may to December.      Patient is in today for foot pain with a history of HTN, GERD, DM2, paronychia, GAD, HLD, leg swelling.  States that back in December she took her toenail polish off. States they were unsure if it was discoloratoin of the toenail because of the polish. She saw me on and then went back to the nail lady and they noiced that the discoloratoin was not from the polish. She had bought some over the counter liquid nail fungus treatment but has not started using it  Review of Systems  Constitutional:  Negative for chills and fever.  Respiratory:  Negative for shortness of breath.   Cardiovascular:  Negative for chest pain.        Objective:    BP 136/68   Pulse 86   Temp 97.9 F (36.6 C) (Oral)   Ht 5\' 4"  (1.626 m)   Wt 171 lb 12.8 oz (77.9 kg)   LMP 03/24/2013   SpO2 98%   BMI 29.49 kg/m  BP Readings from Last 3 Encounters:  07/28/23 136/68  06/23/23 (!) 149/74  06/21/23 138/72   Wt Readings from Last 3 Encounters:  07/28/23 171 lb 12.8 oz (77.9 kg)  06/23/23 171 lb 3.2 oz (77.7 kg)  06/21/23 174 lb (78.9 kg)   SpO2 Readings from Last 3 Encounters:  07/28/23 98%  06/21/23 99%  06/01/23 100%      Physical Exam Vitals and nursing note reviewed.  Constitutional:      Appearance: Normal appearance.  Cardiovascular:     Rate and Rhythm: Normal rate and regular rhythm.     Heart sounds: Normal heart sounds.  Pulmonary:     Effort: Pulmonary effort is normal.     Breath sounds: Normal breath sounds.  Musculoskeletal:       Feet:  Neurological:     Mental Status: She is alert.     No results found for  any visits on 07/28/23.      Assessment & Plan:   Problem List Items Addressed This Visit   None Visit Diagnoses       Onychomycosis    -  Primary   Relevant Orders   Hepatic function panel       No orders of the defined types were placed in this encounter.   Return in about 3 weeks (around 08/18/2023) for lab only appt for LFT.  Margarie Shay, NP

## 2023-07-28 NOTE — Patient Instructions (Signed)
 Nice to see you toyda I will be in touch with the labs and medication once I have reviewed them Make a lab appointment for 3 weeks from today

## 2023-07-29 LAB — HEPATIC FUNCTION PANEL
ALT: 16 U/L (ref 0–35)
AST: 14 U/L (ref 0–37)
Albumin: 4.4 g/dL (ref 3.5–5.2)
Alkaline Phosphatase: 76 U/L (ref 39–117)
Bilirubin, Direct: 0 mg/dL (ref 0.0–0.3)
Total Bilirubin: 0.2 mg/dL (ref 0.2–1.2)
Total Protein: 7.1 g/dL (ref 6.0–8.3)

## 2023-07-30 ENCOUNTER — Other Ambulatory Visit: Payer: Self-pay | Admitting: Nurse Practitioner

## 2023-07-30 ENCOUNTER — Encounter: Payer: Self-pay | Admitting: Nurse Practitioner

## 2023-07-30 DIAGNOSIS — B351 Tinea unguium: Secondary | ICD-10-CM

## 2023-07-30 MED ORDER — TERBINAFINE HCL 250 MG PO TABS: 250.0000 mg | ORAL_TABLET | Freq: Every day | ORAL | 0 refills | Status: DC

## 2023-08-17 ENCOUNTER — Other Ambulatory Visit: Payer: Self-pay | Admitting: Primary Care

## 2023-08-17 DIAGNOSIS — E1165 Type 2 diabetes mellitus with hyperglycemia: Secondary | ICD-10-CM

## 2023-08-24 ENCOUNTER — Telehealth: Payer: Self-pay | Admitting: *Deleted

## 2023-08-24 NOTE — Telephone Encounter (Signed)
 Copied from CRM [id number]. Topic: Clinical - Medical Advice >> Aug 24, 2023  3:44 PM Baldo Levan wrote: Reason for CRM: Patient is calling stating that she has a lab appointment on June 9 for her liver labs that [employer] put in. Patient then received a message that Tretha Fu wanted to check her A1C labs. Patient is asking if it is okay to have these labs done at the same time.

## 2023-08-24 NOTE — Telephone Encounter (Signed)
 Spoke with pt and let her know that she can do both of these labs at the same time.

## 2023-08-27 ENCOUNTER — Other Ambulatory Visit: Payer: Self-pay | Admitting: Nurse Practitioner

## 2023-08-27 DIAGNOSIS — B351 Tinea unguium: Secondary | ICD-10-CM

## 2023-08-29 ENCOUNTER — Other Ambulatory Visit: Payer: Self-pay | Admitting: Primary Care

## 2023-08-29 DIAGNOSIS — E1165 Type 2 diabetes mellitus with hyperglycemia: Secondary | ICD-10-CM

## 2023-08-30 ENCOUNTER — Other Ambulatory Visit (INDEPENDENT_AMBULATORY_CARE_PROVIDER_SITE_OTHER)

## 2023-08-30 ENCOUNTER — Ambulatory Visit: Payer: Self-pay | Admitting: Primary Care

## 2023-08-30 DIAGNOSIS — E1165 Type 2 diabetes mellitus with hyperglycemia: Secondary | ICD-10-CM

## 2023-08-30 DIAGNOSIS — E785 Hyperlipidemia, unspecified: Secondary | ICD-10-CM

## 2023-08-30 LAB — HEPATIC FUNCTION PANEL
ALT: 20 U/L (ref 0–35)
AST: 16 U/L (ref 0–37)
Albumin: 4.5 g/dL (ref 3.5–5.2)
Alkaline Phosphatase: 79 U/L (ref 39–117)
Bilirubin, Direct: 0.1 mg/dL (ref 0.0–0.3)
Total Bilirubin: 0.5 mg/dL (ref 0.2–1.2)
Total Protein: 7.1 g/dL (ref 6.0–8.3)

## 2023-08-30 LAB — HEMOGLOBIN A1C: Hgb A1c MFr Bld: 7.1 % — ABNORMAL HIGH (ref 4.6–6.5)

## 2023-09-07 ENCOUNTER — Other Ambulatory Visit: Payer: Self-pay

## 2023-09-07 DIAGNOSIS — E785 Hyperlipidemia, unspecified: Secondary | ICD-10-CM

## 2023-09-07 MED ORDER — ROSUVASTATIN CALCIUM 20 MG PO TABS: 20.0000 mg | ORAL_TABLET | Freq: Every day | ORAL | 0 refills | Status: DC

## 2023-09-08 ENCOUNTER — Encounter: Payer: Self-pay | Admitting: Primary Care

## 2023-09-08 ENCOUNTER — Encounter: Payer: Self-pay | Admitting: Sleep Medicine

## 2023-09-08 ENCOUNTER — Ambulatory Visit: Admitting: Primary Care

## 2023-09-08 ENCOUNTER — Ambulatory Visit (INDEPENDENT_AMBULATORY_CARE_PROVIDER_SITE_OTHER): Admitting: Sleep Medicine

## 2023-09-08 VITALS — BP 140/80 | HR 67 | Temp 98.4°F | Ht 64.0 in | Wt 176.0 lb

## 2023-09-08 VITALS — BP 124/64 | HR 78 | Temp 98.1°F | Ht 64.0 in | Wt 173.8 lb

## 2023-09-08 DIAGNOSIS — R0683 Snoring: Secondary | ICD-10-CM

## 2023-09-08 DIAGNOSIS — Z6830 Body mass index (BMI) 30.0-30.9, adult: Secondary | ICD-10-CM

## 2023-09-08 DIAGNOSIS — Z87891 Personal history of nicotine dependence: Secondary | ICD-10-CM | POA: Diagnosis not present

## 2023-09-08 DIAGNOSIS — I10 Essential (primary) hypertension: Secondary | ICD-10-CM | POA: Diagnosis not present

## 2023-09-08 DIAGNOSIS — H938X3 Other specified disorders of ear, bilateral: Secondary | ICD-10-CM

## 2023-09-08 DIAGNOSIS — M545 Low back pain, unspecified: Secondary | ICD-10-CM | POA: Diagnosis not present

## 2023-09-08 DIAGNOSIS — E669 Obesity, unspecified: Secondary | ICD-10-CM | POA: Diagnosis not present

## 2023-09-08 DIAGNOSIS — R5382 Chronic fatigue, unspecified: Secondary | ICD-10-CM | POA: Insufficient documentation

## 2023-09-08 DIAGNOSIS — G4733 Obstructive sleep apnea (adult) (pediatric): Secondary | ICD-10-CM

## 2023-09-08 LAB — POC URINALSYSI DIPSTICK (AUTOMATED)
Bilirubin, UA: NEGATIVE
Blood, UA: NEGATIVE
Glucose, UA: NEGATIVE
Ketones, UA: NEGATIVE
Nitrite, UA: NEGATIVE
Protein, UA: NEGATIVE
Spec Grav, UA: <=1.005 — AB (ref 1.010–1.025)
Urobilinogen, UA: NEGATIVE U/dL — AB
pH, UA: 8 (ref 5.0–8.0)

## 2023-09-08 NOTE — Addendum Note (Signed)
 Addended by: Katelynn Heidler K on: 09/08/2023 08:18 AM   Modules accepted: Orders

## 2023-09-08 NOTE — Assessment & Plan Note (Addendum)
 Differentials include allergies, infection, eustachian tube dysfunction.  Exam today reassuring.  Offered prednisone  course for which she declines.  Increase Flonase  to 1 spray BID. Discussed to switch from Claritin to Zyrtec.  Consider ENT referral.

## 2023-09-08 NOTE — Progress Notes (Addendum)
 Subjective:    Patient ID: Hannah Rocha, female    DOB: [birth year], 58 y.o.   MRN: [id number]  Back Pain Pertinent negatives include no fever.  Ear Fullness  Associated symptoms include a sore throat.    Hannah Rocha is a very pleasant 58 y.o. female history of type 2 diabetes, hypertension, ear fullness, eustachian tube dysfunction, carpal tunnel syndrome who presents today to discuss several concerns.  1) Ear Fullness: Acute  for the last 10 days, both ears feel full. Also with nasal congestion and post nasal drip. She has noticed some discomfort to the right ear with an ulcer to the right side of her throat. She has a history of these. She denies fevers, cough, fatigue, sinus pressure, feeling sick. She's been taking loratadine and using Flonase  with some improvement.   2) Back Pain: Her pain is located to the bilateral lower back which began about 10 days ago. She describes her pain as sore and achy.   She denies injury, being more sedentary. She does have poor posture at work, has caught herself leaning forward while typing while sitting at her desk. She denies radiation of pain, numbness/tingling. Her pain is worse when sitting for prolonged periods of time.  3) Chronic Fatigue: Chronic for years, worse over the last 2 weeks. Over the last year she's been taking 30 minute naps after work, wakes up feeling more refreshed. Over the last 2 weeks the naps haven't really helped. She does snore at night per her husband. She has noticed waking during the night during some nights. She does feel tired during the day.  Symptoms  Wt Readings from Last 3 Encounters:  09/08/23 173 lb 12.8 oz (78.8 kg)  07/28/23 171 lb 12.8 oz (77.9 kg)  06/23/23 171 lb 3.2 oz (77.7 kg)       Review of Systems  Constitutional:  Positive for fatigue. Negative for chills and fever.  HENT:  Positive for sore throat. Negative for sinus pressure and sinus pain.        Ear fullness  Respiratory:   Negative for shortness of breath.   Musculoskeletal:  Positive for back pain.         Past Medical History:  Diagnosis Date   Allergy    Anemia    hx   Anxiety    CARDIAC MURMUR, HX OF 05/03/2007   Annotation: ASD Qualifier: Diagnosis of  By: Roxine Cordia MD, Candie Chamber    Cataract    FLOATING CATARACT    COVID-19 02/07/2021   De Quervain's tenosynovitis, right 03/29/2019   Diabetes mellitus without complication (HCC)    Dysplastic nevus 01/14/2015   right spinal mid back, severe, excised 03/04/2015   Dysplastic nevus 02/08/2018   right upper back/post shoulder, moderate   Dysplastic nevus 02/08/2018   left spinal mid upper back, severe, excised 03/21/2018   Fluttering sensation of heart 06/25/2021   Generalized abdominal pain 09/19/2021   GERD (gastroesophageal reflux disease)    H/O hiatal hernia    Heart murmur    ASD repair 1999- no meds    HIATAL HERNIA 07/09/2008   Qualifier: Diagnosis of  By: Celestia Colander CMA (AAMA), Leisha     Hx of dysplastic nevus    multiple sites, some severe   Hyperlipidemia    IBS 06/21/2009   Qualifier: Diagnosis of  By: Adan Holms MD Rito Chess    MENOPAUSAL SYNDROME 01/15/2010   Qualifier: Diagnosis of  By: Kirke Pepper MD, Talia  Muscle tightness 09/19/2021   Nausea vomiting and diarrhea 07/03/2022   Obesity    Plant allergic contact dermatitis 10/13/2018   PONV (postoperative nausea and vomiting)    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   Annotation: REPAIRED AS CHILD Qualifier: Diagnosis of  By: Roxine Cordia MD, Candie Chamber    Viral URI with cough 11/30/2012    Social History   Socioeconomic History   Marital status: Married    Spouse name: Not on file   Number of children: 0   Years of education: Not on file   Highest education level: Not on file  Occupational History   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    Employer: [employer]  Tobacco Use   Smoking status: Former    Current packs/day: 0.00    Types:  Cigarettes    Quit date: 02/21/1987    Years since quitting: 36.5   Smokeless tobacco: Never   Tobacco comments:    22 years ago-light smoker  Vaping Use   Vaping status: Never Used  Substance and Sexual Activity   Alcohol use: Yes    Alcohol/week: 0.0 standard drinks of alcohol    Comment: occasional - hard apple cider, shot   Drug use: No   Sexual activity: Not Currently    Partners: Male    Birth control/protection: Surgical    Comment: tubaligation/hysterctomy  Other Topics Concern   Not on file  Social History Narrative   Daily caffeine use   Social Drivers of Health   Financial Resource Strain: Not on file  Food Insecurity: Not on file  Transportation Needs: Not on file  Physical Activity: Not on file  Stress: Not on file  Social Connections: Not on file  Intimate Partner Violence: Not on file    Past Surgical History:  Procedure Laterality Date   ASD REPAIR  1999   [facility]   BREAST CYST ASPIRATION Right 2013   Carpal tunnel repair Right 11/27/2022   CATARACT EXTRACTION Bilateral    Right done September 2023 and Left January 08 2022   CATARACT EXTRACTION Bilateral 12/21/2021   CLEFT LIP REPAIR     COLONOSCOPY     COLONOSCOPY WITH PROPOFOL  N/A 12/31/2022   Procedure: COLONOSCOPY WITH PROPOFOL ;  Surgeon: Asencion Blacksmith, MD;  Location: [facility];  Service: Gastroenterology;  Laterality: N/A;   LAPAROSCOPIC ASSISTED VAGINAL HYSTERECTOMY Bilateral 04/13/2013   Procedure: LAPAROSCOPIC ASSISTED VAGINAL HYSTERECTOMY;  Surgeon: Ana Balling, MD;  Location: [facility];  Service: Gynecology;  Laterality: Bilateral;- pt states was abdominal hysterectomy    LAPAROSCOPIC TUBAL LIGATION  03/21/2012   Procedure: LAPAROSCOPIC TUBAL LIGATION;  Surgeon: Ana Balling, MD;  Location: [facility];  Service: Gynecology;  Laterality: Bilateral;   MANDIBLE FRACTURE SURGERY     Plantar Fascitis Repair Bilateral 01/2015    Family History  Problem Relation Age of Onset   Heart disease Mother     Colon polyps Mother    Diabetes Mother    Prostate cancer Father    Heart disease Father    Cancer Father        prostate   Diabetes Maternal Aunt    Diabetes Paternal Grandmother    Fibromyalgia Sister    Colon cancer Neg Hx    Breast cancer Neg Hx    Esophageal cancer Neg Hx    Rectal cancer Neg Hx    Stomach cancer Neg Hx     Allergies  Allergen Reactions   Sertraline Hcl  REACTION: vomiting, severe anxiety    Current Outpatient Medications on File Prior to Visit  Medication Sig Dispense Refill   B Complex Vitamins (VITAMIN B COMPLEX PO) Take 1 tablet by mouth daily.     busPIRone  (BUSPAR ) 30 MG tablet TAKE 1 TABLET BY MOUTH EVERY MORNING 1/2 TAB AT LUNCH, AND 1/2 TAB AT BEDTIME FOR ANXIETY. 180 tablet 2   escitalopram  (LEXAPRO ) 10 MG tablet TAKE 1 TABLET (10 MG TOTAL) BY MOUTH DAILY. FOR ANXIETY. 90 tablet 2   famotidine  (PEPCID ) 20 MG tablet TAKE 1 TABLET (20 MG) BY MOUTH AT BEDTIME AS NEEDED FOR HEARTBURN OR INDIGESTION. FOR HEARTBURN. 90 tablet 2   flintstones complete (FLINTSTONES) 60 MG chewable tablet Chew 1 tablet by mouth daily.     fluticasone  (FLONASE ) 50 MCG/ACT nasal spray Place 2 sprays into both nostrils daily. 16 g 6   ibuprofen  (ADVIL ) 800 MG tablet Take 1 tablet (800 mg total) by mouth every 8 (eight) hours as needed. 60 tablet 3   losartan -hydrochlorothiazide  (HYZAAR) 100-12.5 MG tablet TAKE 1 TABLET BY MOUTH EVERY DAY 90 tablet 3   metFORMIN  (GLUCOPHAGE -XR) 500 MG 24 hr tablet TAKE 2 TABLETS (1,000 MG TOTAL) BY MOUTH DAILY WITH BREAKFAST. FOR DIABETES. 180 tablet 1   mupirocin  ointment (BACTROBAN ) 2 % Apply to affected area toe twice daily until improved. 22 g 0   pantoprazole  (PROTONIX ) 40 MG tablet Take 1 tablet (40 mg total) by mouth daily. 90 tablet 3   rosuvastatin  (CRESTOR ) 20 MG tablet Take 1 tablet (20 mg total) by mouth daily. 90 tablet 0   terbinafine  (LAMISIL ) 250 MG tablet TAKE 1 TABLET BY MOUTH EVERY DAY 30 tablet 0   acetaminophen   (TYLENOL ) 500 MG tablet Take 500 mg by mouth every 6 (six) hours as needed for mild pain.  (Patient not taking: Reported on 09/08/2023)     No current facility-administered medications on file prior to visit.    BP 124/64   Pulse 78   Temp 98.1 F (36.7 C) (Oral)   Ht 5' 4 (1.626 m)   Wt 173 lb 12.8 oz (78.8 kg)   LMP 03/24/2013   SpO2 98%   BMI 29.83 kg/m  Objective:   Physical Exam Constitutional:      Appearance: She is not ill-appearing.  HENT:     Right Ear: Tympanic membrane and ear canal normal.     Left Ear: Tympanic membrane and ear canal normal.     Nose: No mucosal edema.     Right Sinus: No maxillary sinus tenderness or frontal sinus tenderness.     Left Sinus: No maxillary sinus tenderness or frontal sinus tenderness.     Mouth/Throat:     Mouth: Mucous membranes are moist.   Eyes:     Conjunctiva/sclera: Conjunctivae normal.    Cardiovascular:     Rate and Rhythm: Normal rate and regular rhythm.  Pulmonary:     Effort: Pulmonary effort is normal.     Breath sounds: Normal breath sounds. No wheezing or rhonchi.   Musculoskeletal:     Cervical back: Neck supple.     Lumbar back: Tenderness present. No bony tenderness. Negative right straight leg raise test and negative left straight leg raise test.   Skin:    General: Skin is warm and dry.           Assessment & Plan:  Acute bilateral low back pain without sciatica Assessment & Plan: Likely secondary from poor posture at work, no alarm signs  in HPI or exam.  UA today with 1+ leuks, otherwise negative. Urine culture ordered and pending.    Encouraged stretching, increased physical activity.  Orders: -     POCT Urinalysis Dipstick (Automated) -     Urine Culture  Sensation of fullness in both ears Assessment & Plan: Differentials include allergies, infection, eustachian tube dysfunction.  Exam today reassuring.  Offered prednisone  course for which she declines.  Increase Flonase  to 1  spray BID. Discussed to switch from Claritin to Zyrtec.  Consider ENT referral.   Chronic fatigue Assessment & Plan: Is likely representative of sleep apnea. Reviewed labs from February, May, June 2025.  Referral placed to pulmonology for sleep study.  Orders: -     Pulmonary Visit  Snoring -     Pulmonary Visit        Gabriel John, NP

## 2023-09-08 NOTE — Assessment & Plan Note (Addendum)
 Likely secondary from poor posture at work, no alarm signs in HPI or exam.  UA today with 1+ leuks, otherwise negative. Urine culture ordered and pending.    Encouraged stretching, increased physical activity.

## 2023-09-08 NOTE — Assessment & Plan Note (Signed)
 Is likely representative of sleep apnea. Reviewed labs from February, May, June 2025.  Referral placed to pulmonology for sleep study.

## 2023-09-08 NOTE — Patient Instructions (Addendum)
 Consider switching to Zyrtec from Claritin.  Increase Flonase  to 1 spray in each nostril twice daily.  Start stretching and walking more to help with back pain.  We will be in touch in a few days regarding the urine culture.  You will either be contacted via phone regarding your referral to pulmonology, or you may receive a letter on your MyChart portal from our referral team with instructions for scheduling an appointment. Please let us  know if you have not been contacted by anyone within two weeks.  It was a pleasure to see you today!

## 2023-09-08 NOTE — Patient Instructions (Signed)
 Hannah Rocha

## 2023-09-08 NOTE — Progress Notes (Signed)
 Name:Hannah Rocha MRN: [id number] DOB: 09-05-1965   CHIEF COMPLAINT:  EXCESSIVE DAYTIME SLEEPINESS   HISTORY OF PRESENT ILLNESS:  Hannah Rocha is a 58 y.o. w/ a h/o HTN, anxiety, depression, obesity, DMII and hyperlipidemia who present for c/o loud snoring and excessive daytime sleepiness which has been present for several years. Reports nocturnal awakenings due to nocturia, however does not have difficulty falling back to sleep. Reports significant weight changes. Admits to dry mouth and night sweats. Denies morning headaches, RLS symptoms, dream enactment, cataplexy, hypnagogic or hypnapompic hallucinations. Denies a family history of sleep apnea. Denies drowsy driving. Denies alcohol, tobacco or illicit drug use.   Bedtime 10-10:30 pm Sleep onset 5 mins Rise time 6:10 am   EPWORTH SLEEP SCORE 4    09/08/2023    3:31 PM  Results of the Epworth flowsheet  Sitting and reading 0  Watching TV 1  Sitting, inactive in a public place (e.g. a theatre or a meeting) 0  As a passenger in a car for an hour without a break 0  Lying down to rest in the afternoon when circumstances permit 3  Sitting and talking to someone 0  Sitting quietly after a lunch without alcohol 0  In a car, while stopped for a few minutes in traffic 0  Total score 4    PAST MEDICAL HISTORY :   has a past medical history of Allergy, Anemia, Anxiety, CARDIAC MURMUR, HX OF (05/03/2007), Cataract, COVID-19 (02/07/2021), De Quervain's tenosynovitis, right (03/29/2019), Diabetes mellitus without complication (HCC), Dysplastic nevus (01/14/2015), Dysplastic nevus (02/08/2018), Dysplastic nevus (02/08/2018), Fluttering sensation of heart (06/25/2021), Generalized abdominal pain (09/19/2021), GERD (gastroesophageal reflux disease), H/O hiatal hernia, Heart murmur, HIATAL HERNIA (07/09/2008), dysplastic nevus, Hyperlipidemia, IBS (06/21/2009), MENOPAUSAL SYNDROME (01/15/2010), Muscle tightness (09/19/2021), Nausea  vomiting and diarrhea (07/03/2022), Obesity, Plant allergic contact dermatitis (10/13/2018), PONV (postoperative nausea and vomiting), 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 (05/03/2007), and Viral URI with cough (11/30/2012).  has a past surgical history that includes ASD repair (1999); Mandible fracture surgery; Cleft lip repair; Laparoscopic tubal ligation (03/21/2012); Laparoscopic assisted vaginal hysterectomy (Bilateral, 04/13/2013); Plantar Fascitis Repair (Bilateral, 01/2015); Colonoscopy; Breast cyst aspiration (Right, 2013); Cataract extraction (Bilateral); Cataract extraction (Bilateral, 12/21/2021); Colonoscopy with propofol  (N/A, 12/31/2022); and Carpal tunnel repair (Right, 11/27/2022). Prior to Admission medications   Medication Sig Start Date End Date Taking? Authorizing Provider  acetaminophen  (TYLENOL ) 500 MG tablet Take 500 mg by mouth every 6 (six) hours as needed for mild pain.    Yes [provider]  B Complex Vitamins (VITAMIN B COMPLEX PO) Take 1 tablet by mouth daily.   Yes [provider]  busPIRone  (BUSPAR ) 30 MG tablet TAKE 1 TABLET BY MOUTH EVERY MORNING 1/2 TAB AT LUNCH, AND 1/2 TAB AT BEDTIME FOR ANXIETY. 06/13/23  Yes Clark, Katherine K, NP  escitalopram  (LEXAPRO ) 10 MG tablet TAKE 1 TABLET (10 MG TOTAL) BY MOUTH DAILY. FOR ANXIETY. 07/11/23  Yes Gabriel John, NP  famotidine  (PEPCID ) 20 MG tablet TAKE 1 TABLET (20 MG) BY MOUTH AT BEDTIME AS NEEDED FOR HEARTBURN OR INDIGESTION. FOR HEARTBURN. 06/23/23  Yes Gabriel John, NP  flintstones complete (FLINTSTONES) 60 MG chewable tablet Chew 1 tablet by mouth daily.   Yes [provider]  fluticasone  (FLONASE ) 50 MCG/ACT nasal spray Place 2 sprays into both nostrils daily. 12/14/22  Yes Tower, Manley Seeds, MD  ibuprofen  (ADVIL ) 800 MG tablet Take 1 tablet (800 mg total) by mouth every 8 (eight)  hours as needed. 04/05/23  Yes Granville Layer, MD  losartan -hydrochlorothiazide  (HYZAAR) 100-12.5 MG  tablet TAKE 1 TABLET BY MOUTH EVERY DAY 12/04/22  Yes Loyde Rule, MD  metFORMIN  (GLUCOPHAGE -XR) 500 MG 24 hr tablet TAKE 2 TABLETS (1,000 MG TOTAL) BY MOUTH DAILY WITH BREAKFAST. FOR DIABETES. 04/15/23  Yes Clark, Katherine K, NP  mupirocin  ointment (BACTROBAN ) 2 % Apply to affected area toe twice daily until improved. 04/19/23  Yes Artemio Larry, MD  pantoprazole  (PROTONIX ) 40 MG tablet Take 1 tablet (40 mg total) by mouth daily. 12/31/22  Yes Asencion Blacksmith, MD  rosuvastatin  (CRESTOR ) 20 MG tablet Take 1 tablet (20 mg total) by mouth daily. 09/07/23  Yes Loyde Rule, MD  terbinafine  (LAMISIL ) 250 MG tablet TAKE 1 TABLET BY MOUTH EVERY DAY 09/01/23  Yes Clark, Katherine K, NP   Allergies  Allergen Reactions   Sertraline Hcl     REACTION: vomiting, severe anxiety    FAMILY HISTORY:  family history includes Cancer in her father; Colon polyps in her mother; Diabetes in her maternal aunt, mother, and paternal grandmother; Fibromyalgia in her sister; Heart disease in her father and mother; Prostate cancer in her father. SOCIAL HISTORY:  reports that she quit smoking about 36 years ago. Her smoking use included cigarettes. She has never used smokeless tobacco. She reports current alcohol use. She reports that she does not use drugs.   Review of Systems:  Gen:  Denies  fever, sweats, chills weight loss  HEENT: Denies blurred vision, double vision, ear pain, eye pain, hearing loss, nose bleeds, sore throat Cardiac:  No dizziness, chest pain or heaviness, chest tightness,edema, No JVD Resp:   No cough, -sputum production, -shortness of breath,-wheezing, -hemoptysis,  Gi: Denies swallowing difficulty, stomach pain, nausea or vomiting, diarrhea, constipation, bowel incontinence Gu:  Denies bladder incontinence, burning urine Ext:   Denies Joint pain, stiffness or swelling Skin: Denies  skin rash, easy bruising or bleeding or hives Endoc:  Denies polyuria, polydipsia , polyphagia or weight  change Psych:   Denies depression, insomnia or hallucinations  Other:  All other systems negative  VITAL SIGNS: BP (!) 140/80 (BP Location: Right Arm, Patient Position: Sitting, Cuff Size: Normal)   Pulse 67   Temp 98.4 F (36.9 C) (Oral)   Ht 5' 4 (1.626 m)   Wt 176 lb (79.8 kg)   LMP 03/24/2013   SpO2 98%   BMI 30.21 kg/m    Physical Examination:   General Appearance: No distress  EYES PERRLA, EOM intact.   NECK Supple, No JVD Pulmonary: normal breath sounds, No wheezing.  CardiovascularNormal S1,S2.  No m/r/g.   Abdomen: Benign, Soft, non-tender. Skin:   warm, no rashes, no ecchymosis  Extremities: normal, no cyanosis, clubbing. Neuro:without focal findings,  speech normal  PSYCHIATRIC: Mood, affect within normal limits.   ASSESSMENT AND PLAN  OSA I suspect that OSA is likely present due to clinical presentation. Discussed the consequences of untreated sleep apnea. Advised not to drive drowsy for safety of patient and others. Will complete further evaluation with a home sleep study and follow up to review results.    HTN Stable, on current management. Following with PCP.   Obesity Counseled patient on diet and lifestyle modification.    MEDICATION ADJUSTMENTS/LABS AND TESTS ORDERED: Recommend Sleep Study   Patient  satisfied with Plan of action and management. All questions answered  Follow up to review HST results and treatment plan.   I spent a total of 25  minutes reviewing chart data, face-to-face evaluation with the patient, counseling and coordination of care as detailed above.    Anneta Rounds, M.D.  Sleep Medicine [facility]

## 2023-09-09 LAB — URINE CULTURE
MICRO NUMBER:: 16595936
SPECIMEN QUALITY:: ADEQUATE

## 2023-09-10 ENCOUNTER — Ambulatory Visit: Payer: Self-pay | Admitting: Primary Care

## 2023-09-17 ENCOUNTER — Other Ambulatory Visit: Payer: Self-pay

## 2023-09-17 MED ORDER — LOSARTAN POTASSIUM-HCTZ 100-12.5 MG PO TABS: 1.0000 | ORAL_TABLET | Freq: Every day | ORAL | 0 refills | Status: DC

## 2023-09-22 ENCOUNTER — Encounter

## 2023-09-22 DIAGNOSIS — G4733 Obstructive sleep apnea (adult) (pediatric): Secondary | ICD-10-CM

## 2023-10-01 ENCOUNTER — Other Ambulatory Visit: Payer: Self-pay | Admitting: Primary Care

## 2023-10-01 DIAGNOSIS — B351 Tinea unguium: Secondary | ICD-10-CM

## 2023-10-01 NOTE — Telephone Encounter (Signed)
 Noted.  This will be her last 30-day supply to equal 90 days in total.  If no resolve then we will send to podiatry.

## 2023-10-01 NOTE — Telephone Encounter (Signed)
 Please call patient:  I received refill request for terbinafine  medication for toenail fungus.  Has her fungus resolved?

## 2023-10-01 NOTE — Telephone Encounter (Signed)
 Spoke with patient she is still taking the medication, states she is seeing improvement but the fungus is not fully resolved. As 3-4 pills left on hand, has been taking daily. Would like refill.

## 2023-10-08 ENCOUNTER — Telehealth: Payer: Self-pay

## 2023-10-08 DIAGNOSIS — R069 Unspecified abnormalities of breathing: Secondary | ICD-10-CM | POA: Diagnosis not present

## 2023-10-08 DIAGNOSIS — B351 Tinea unguium: Secondary | ICD-10-CM

## 2023-10-08 NOTE — Telephone Encounter (Signed)
 Orders placed.

## 2023-10-08 NOTE — Addendum Note (Signed)
 Addended by: Wille Aubuchon K on: 10/08/2023 02:59 PM   Modules accepted: Orders

## 2023-10-08 NOTE — Telephone Encounter (Signed)
 Copied from CRM [id number]. Topic: Clinical - Request for Lab/Test Order >> Oct 08, 2023  8:50 AM Donna BRAVO wrote: Reason for CRM: patient calling asking for lab orders for liver function due to medication

## 2023-10-09 ENCOUNTER — Other Ambulatory Visit: Payer: Self-pay | Admitting: Primary Care

## 2023-10-09 DIAGNOSIS — E119 Type 2 diabetes mellitus without complications: Secondary | ICD-10-CM

## 2023-10-12 ENCOUNTER — Ambulatory Visit: Payer: Self-pay

## 2023-10-14 NOTE — Telephone Encounter (Signed)
 I spoke with the patient. She wanted to come into the office and go over her results in person. I scheduled her an appt for 7/28 at 3:45pm.  Nothing further needed.

## 2023-10-14 NOTE — Telephone Encounter (Signed)
-----   Message from [facility] D REDDY sent at 10/12/2023 10:06 AM EDT ----- Regarding: HST results Please notify patient that HST revealed moderate OSA, recommend proceeding with APAP therapy set to 4-16 cm H2O, EPR 3 with the Airtouch N30i nasal mask. Please also schedule a 3 month follow up  visit if patient wishes to proceed with CPAP therapy. Thanks    ----- Message ----- From: Vannie Donzell RAMAN Sent: 10/11/2023   8:06 AM EDT To: Pallavi D Reddy, MD

## 2023-10-15 ENCOUNTER — Other Ambulatory Visit (INDEPENDENT_AMBULATORY_CARE_PROVIDER_SITE_OTHER)

## 2023-10-15 DIAGNOSIS — B351 Tinea unguium: Secondary | ICD-10-CM | POA: Diagnosis not present

## 2023-10-15 NOTE — Addendum Note (Signed)
 Addended by: ISADORA RAISIN on: 10/15/2023 04:00 PM   Modules accepted: Orders

## 2023-10-16 LAB — HEPATIC FUNCTION PANEL
AG Ratio: 2.2 (calc) (ref 1.0–2.5)
ALT: 16 U/L (ref 6–29)
AST: 14 U/L (ref 10–35)
Albumin: 4.6 g/dL (ref 3.6–5.1)
Alkaline phosphatase (APISO): 90 U/L (ref 37–153)
Bilirubin, Direct: 0.1 mg/dL (ref 0.0–0.2)
Globulin: 2.1 g/dL (ref 1.9–3.7)
Indirect Bilirubin: 0.1 mg/dL — ABNORMAL LOW (ref 0.2–1.2)
Total Bilirubin: 0.2 mg/dL (ref 0.2–1.2)
Total Protein: 6.7 g/dL (ref 6.1–8.1)

## 2023-10-17 ENCOUNTER — Ambulatory Visit: Payer: Self-pay | Admitting: Primary Care

## 2023-10-18 ENCOUNTER — Encounter: Payer: Self-pay | Admitting: Sleep Medicine

## 2023-10-18 ENCOUNTER — Ambulatory Visit: Admitting: Sleep Medicine

## 2023-10-18 VITALS — BP 130/80 | HR 77 | Temp 97.7°F | Ht 64.0 in | Wt 178.2 lb

## 2023-10-18 DIAGNOSIS — Z6830 Body mass index (BMI) 30.0-30.9, adult: Secondary | ICD-10-CM

## 2023-10-18 DIAGNOSIS — G4733 Obstructive sleep apnea (adult) (pediatric): Secondary | ICD-10-CM

## 2023-10-18 DIAGNOSIS — I10 Essential (primary) hypertension: Secondary | ICD-10-CM

## 2023-10-18 DIAGNOSIS — E66811 Obesity, class 1: Secondary | ICD-10-CM

## 2023-10-18 NOTE — Patient Instructions (Addendum)
 SABRA

## 2023-10-18 NOTE — Progress Notes (Signed)
 Name:Hannah Rocha MRN: [id number] DOB: 02-10-1966   CHIEF COMPLAINT:  HST F/U   HISTORY OF PRESENT ILLNESS:  Hannah Rocha is a 58 y.o. w/ a h/o HTN and obesity who presents to follow up on HST results. The patient underwent HST which revealed moderate OSA (AHI 26, O2 nadir 71%).    EPWORTH SLEEP SCORE    09/08/2023    3:31 PM  Results of the Epworth flowsheet  Sitting and reading 0  Watching TV 1  Sitting, inactive in a public place (e.g. a theatre or a meeting) 0  As a passenger in a car for an hour without a break 0  Lying down to rest in the afternoon when circumstances permit 3  Sitting and talking to someone 0  Sitting quietly after a lunch without alcohol 0  In a car, while stopped for a few minutes in traffic 0  Total score 4    PAST MEDICAL HISTORY :   has a past medical history of Allergy, Anemia, Anxiety, CARDIAC MURMUR, HX OF (05/03/2007), Cataract, COVID-19 (02/07/2021), De Quervain's tenosynovitis, right (03/29/2019), Diabetes mellitus without complication (HCC), Dysplastic nevus (01/14/2015), Dysplastic nevus (02/08/2018), Dysplastic nevus (02/08/2018), Fluttering sensation of heart (06/25/2021), Generalized abdominal pain (09/19/2021), GERD (gastroesophageal reflux disease), H/O hiatal hernia, Heart murmur, HIATAL HERNIA (07/09/2008), dysplastic nevus, Hyperlipidemia, IBS (06/21/2009), MENOPAUSAL SYNDROME (01/15/2010), Muscle tightness (09/19/2021), Nausea vomiting and diarrhea (07/03/2022), Obesity, Plant allergic contact dermatitis (10/13/2018), PONV (postoperative nausea and vomiting), 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 (05/03/2007), and Viral URI with cough (11/30/2012).  has a past surgical history that includes ASD repair (1999); Mandible fracture surgery; Cleft lip repair; Laparoscopic tubal ligation (03/21/2012); Laparoscopic assisted vaginal hysterectomy (Bilateral, 04/13/2013); Plantar Fascitis Repair (Bilateral, 01/2015); Colonoscopy;  Breast cyst aspiration (Right, 2013); Cataract extraction (Bilateral); Cataract extraction (Bilateral, 12/21/2021); Colonoscopy with propofol  (N/A, 12/31/2022); and Carpal tunnel repair (Right, 11/27/2022). Prior to Admission medications   Medication Sig Start Date End Date Taking? Authorizing Provider  acetaminophen  (TYLENOL ) 500 MG tablet Take 500 mg by mouth every 6 (six) hours as needed for mild pain.    Yes [provider]  B Complex Vitamins (VITAMIN B COMPLEX PO) Take 1 tablet by mouth daily.   Yes [provider]  busPIRone  (BUSPAR ) 30 MG tablet TAKE 1 TABLET BY MOUTH EVERY MORNING 1/2 TAB AT LUNCH, AND 1/2 TAB AT BEDTIME FOR ANXIETY. 06/13/23  Yes Clark, Katherine K, NP  escitalopram  (LEXAPRO ) 10 MG tablet TAKE 1 TABLET (10 MG TOTAL) BY MOUTH DAILY. FOR ANXIETY. 07/11/23  Yes Clark, Katherine K, NP  famotidine  (PEPCID ) 20 MG tablet TAKE 1 TABLET (20 MG) BY MOUTH AT BEDTIME AS NEEDED FOR HEARTBURN OR INDIGESTION. FOR HEARTBURN. 06/23/23  Yes Gretta Comer POUR, NP  flintstones complete (FLINTSTONES) 60 MG chewable tablet Chew 1 tablet by mouth daily.   Yes [provider]  fluticasone  (FLONASE ) 50 MCG/ACT nasal spray Place 2 sprays into both nostrils daily. 12/14/22  Yes Tower, Laine LABOR, MD  ibuprofen  (ADVIL ) 800 MG tablet TAKE 1 TABLET BY MOUTH EVERY 8 HOURS AS NEEDED 09/10/23  Yes Fredirick Glenys RAMAN, MD  ibuprofen  (ADVIL ) 800 MG tablet Take 1 tablet (800 mg total) by mouth every 8 (eight) hours as needed. 04/05/23  Yes Fredirick Glenys RAMAN, MD  losartan -hydrochlorothiazide  (HYZAAR) 100-12.5 MG tablet Take 1 tablet by mouth daily. 09/17/23  Yes Delford Maude BROCKS, MD  metFORMIN  (GLUCOPHAGE -XR) 500 MG 24 hr tablet TAKE 2 TABLETS (1,000 MG TOTAL) BY MOUTH DAILY  WITH BREAKFAST. FOR DIABETES. 10/10/23  Yes Clark, Katherine K, NP  mupirocin  ointment (BACTROBAN ) 2 % Apply to affected area toe twice daily until improved. 04/19/23  Yes Jackquline Sawyer, MD  pantoprazole  (PROTONIX ) 40 MG tablet Take 1  tablet (40 mg total) by mouth daily. 12/31/22  Yes Aneita Gwendlyn DASEN, MD  rosuvastatin  (CRESTOR ) 20 MG tablet Take 1 tablet (20 mg total) by mouth daily. 09/07/23  Yes Delford Maude BROCKS, MD  terbinafine  (LAMISIL ) 250 MG tablet TAKE 1 TABLET BY MOUTH EVERY DAY 10/01/23  Yes Clark, Katherine K, NP   Allergies  Allergen Reactions   Sertraline Hcl     REACTION: vomiting, severe anxiety    FAMILY HISTORY:  family history includes Cancer in her father; Colon polyps in her mother; Diabetes in her maternal aunt, mother, and paternal grandmother; Fibromyalgia in her sister; Heart disease in her father and mother; Prostate cancer in her father. SOCIAL HISTORY:  reports that she quit smoking about 36 years ago. Her smoking use included cigarettes. She has never used smokeless tobacco. She reports current alcohol use. She reports that she does not use drugs.   Review of Systems:  Gen:  Denies  fever, sweats, chills weight loss  HEENT: Denies blurred vision, double vision, ear pain, eye pain, hearing loss, nose bleeds, sore throat Cardiac:  No dizziness, chest pain or heaviness, chest tightness,edema, No JVD Resp:   No cough, -sputum production, -shortness of breath,-wheezing, -hemoptysis,  Gi: Denies swallowing difficulty, stomach pain, nausea or vomiting, diarrhea, constipation, bowel incontinence Gu:  Denies bladder incontinence, burning urine Ext:   Denies Joint pain, stiffness or swelling Skin: Denies  skin rash, easy bruising or bleeding or hives Endoc:  Denies polyuria, polydipsia , polyphagia or weight change Psych:   Denies depression, insomnia or hallucinations  Other:  All other systems negative  VITAL SIGNS: BP 130/80 (BP Location: Right Arm, Patient Position: Sitting, Cuff Size: Large)   Pulse 77   Temp 97.7 F (36.5 C) (Oral)   Ht 5' 4 (1.626 m)   Wt 178 lb 3.2 oz (80.8 kg)   LMP 03/24/2013   SpO2 97%   BMI 30.59 kg/m    Physical Examination:   General Appearance: No  distress  EYES PERRLA, EOM intact.   NECK Supple, No JVD Pulmonary: normal breath sounds, No wheezing.  CardiovascularNormal S1,S2.  No m/r/g.   Abdomen: Benign, Soft, non-tender. Skin:   warm, no rashes, no ecchymosis  Extremities: normal, no cyanosis, clubbing. Neuro:without focal findings,  speech normal  PSYCHIATRIC: Mood, affect within normal limits.   ASSESSMENT AND PLAN  OSA Reviewed HST results with patient. Starting on APAP therapy set to 4-14 cm H2O. Discussed the consequences of untreated sleep apnea. Advised not to drive drowsy for safety of patient and others. Will follow up in 3 months.   HTN Stable, on current management. Following with PCP.   Obesity Counseled patient on diet and lifestyle modification.    Patient  satisfied with Plan of action and management. All questions answered  I spent a total of 21 minutes reviewing chart data, face-to-face evaluation with the patient, counseling and coordination of care as detailed above.    Novalee Horsfall, M.D.  Sleep Medicine [facility]

## 2023-10-21 DIAGNOSIS — G4733 Obstructive sleep apnea (adult) (pediatric): Secondary | ICD-10-CM | POA: Diagnosis not present

## 2023-10-28 ENCOUNTER — Ambulatory Visit: Payer: BC Managed Care – PPO | Admitting: Primary Care

## 2023-11-10 DIAGNOSIS — G4733 Obstructive sleep apnea (adult) (pediatric): Secondary | ICD-10-CM | POA: Diagnosis not present

## 2023-11-16 ENCOUNTER — Other Ambulatory Visit (HOSPITAL_COMMUNITY)
Admission: RE | Admit: 2023-11-16 | Discharge: 2023-11-16 | Disposition: A | Discharge status: Home / Self Care | Source: Ambulatory Visit | Attending: Family Medicine | Admitting: Family Medicine

## 2023-11-16 ENCOUNTER — Ambulatory Visit: Admitting: Family Medicine

## 2023-11-16 ENCOUNTER — Encounter: Payer: Self-pay | Admitting: Family Medicine

## 2023-11-16 VITALS — BP 136/74 | HR 60 | Ht 64.0 in | Wt 182.4 lb

## 2023-11-16 DIAGNOSIS — Z113 Encounter for screening for infections with a predominantly sexual mode of transmission: Secondary | ICD-10-CM | POA: Insufficient documentation

## 2023-11-16 DIAGNOSIS — Z01419 Encounter for gynecological examination (general) (routine) without abnormal findings: Secondary | ICD-10-CM | POA: Diagnosis not present

## 2023-11-16 DIAGNOSIS — Z1331 Encounter for screening for depression: Secondary | ICD-10-CM | POA: Diagnosis not present

## 2023-11-16 DIAGNOSIS — N898 Other specified noninflammatory disorders of vagina: Secondary | ICD-10-CM | POA: Diagnosis not present

## 2023-11-16 DIAGNOSIS — Z124 Encounter for screening for malignant neoplasm of cervix: Secondary | ICD-10-CM | POA: Diagnosis not present

## 2023-11-16 DIAGNOSIS — Z1231 Encounter for screening mammogram for malignant neoplasm of breast: Secondary | ICD-10-CM

## 2023-11-16 DIAGNOSIS — G4733 Obstructive sleep apnea (adult) (pediatric): Secondary | ICD-10-CM | POA: Insufficient documentation

## 2023-11-16 NOTE — Progress Notes (Signed)
 Subjective:     Hannah Rocha is a 58 y.o. female and is here for a comprehensive physical exam. The patient reports problems - she is s/p LAVH and has persistent vaginal discharge. Brown/beige in color. No irritation. No odor.  The following portions of the patient's history were reviewed and updated as appropriate: allergies, current medications, past family history, past medical history, past social history, past surgical history, and problem list.  Review of Systems Pertinent items noted in HPI and remainder of comprehensive ROS otherwise negative.   Objective:  Chaperone present for exam   BP 136/74 (BP Location: Right Arm, Patient Position: Sitting, Cuff Size: Large)   Pulse 60   Ht 5' 4 (1.626 m)   Wt 182 lb 6.4 oz (82.7 kg)   LMP 03/24/2013   BMI 31.31 kg/m  General appearance: alert, cooperative, and appears stated age Head: Normocephalic, without obvious abnormality, atraumatic Neck: no adenopathy, supple, symmetrical, trachea midline, and thyroid  not enlarged, symmetric, no tenderness/mass/nodules Lungs: clear to auscultation bilaterally Breasts: normal appearance, no masses or tenderness Heart: regular rate and rhythm, S1, S2 normal, no murmur, click, rub or gallop Abdomen: soft, non-tender; bowel sounds normal; no masses,  no organomegaly Pelvic: external genitalia normal, no adnexal masses or tenderness, uterus surgically absent, vagina normal without discharge, and brown discharge noted Extremities: Homans sign is negative, no sign of DVT Pulses: 2+ and symmetric Skin: Skin color, texture, turgor normal. No rashes or lesions Lymph nodes: Cervical, supraclavicular, and axillary nodes normal. Neurologic: Grossly normal    Assessment:    GYN female exam.      Plan:  Well woman exam with routine gynecological exam  Vaginal discharge - 99213-previously neg wet prep--recheck and add mycoplasma/ureaplasma and treat accordingly - Plan: Cervicovaginal ancillary only(  [place]), Mycoplasma / ureaplasma culture  Encounter for screening mammogram for malignant neoplasm of breast - Plan: MM 3D SCREENING MAMMOGRAM BILATERAL BREAST    See After Visit Summary for Counseling Recommendations

## 2023-11-16 NOTE — Progress Notes (Signed)
 Patient presents for Annual.  LMP: Patient's last menstrual period was 03/24/2013.  Last pap: Date: 03/12/2017 Contraception:   Mammogram: Due, last mammogram: 12/01/2022 STD Screening:   Flu Vaccine : N/A  CC:    Fun Fact: none

## 2023-11-17 LAB — CERVICOVAGINAL ANCILLARY ONLY
Bacterial Vaginitis (gardnerella): NEGATIVE
Candida Glabrata: NEGATIVE
Candida Vaginitis: NEGATIVE
Chlamydia: NEGATIVE
Comment: NEGATIVE
Comment: NEGATIVE
Comment: NEGATIVE
Comment: NEGATIVE
Comment: NEGATIVE
Comment: NORMAL
Neisseria Gonorrhea: NEGATIVE
Trichomonas: NEGATIVE

## 2023-11-21 DIAGNOSIS — G4733 Obstructive sleep apnea (adult) (pediatric): Secondary | ICD-10-CM | POA: Diagnosis not present

## 2023-11-25 ENCOUNTER — Other Ambulatory Visit

## 2023-11-25 LAB — MYCOPLASMA / UREAPLASMA CULTURE
Mycoplasma hominis Culture: NEGATIVE
Ureaplasma urealyticum: NEGATIVE

## 2023-11-26 ENCOUNTER — Ambulatory Visit: Payer: Self-pay | Admitting: Family Medicine

## 2023-12-02 ENCOUNTER — Ambulatory Visit: Payer: Self-pay | Admitting: Primary Care

## 2023-12-02 ENCOUNTER — Ambulatory Visit: Admitting: Primary Care

## 2023-12-02 ENCOUNTER — Encounter: Payer: Self-pay | Admitting: Primary Care

## 2023-12-02 VITALS — BP 142/70 | HR 73 | Temp 97.2°F | Ht 64.0 in | Wt 181.0 lb

## 2023-12-02 DIAGNOSIS — E1165 Type 2 diabetes mellitus with hyperglycemia: Secondary | ICD-10-CM

## 2023-12-02 DIAGNOSIS — Z7984 Long term (current) use of oral hypoglycemic drugs: Secondary | ICD-10-CM

## 2023-12-02 DIAGNOSIS — Z23 Encounter for immunization: Secondary | ICD-10-CM | POA: Diagnosis not present

## 2023-12-02 DIAGNOSIS — B351 Tinea unguium: Secondary | ICD-10-CM | POA: Diagnosis not present

## 2023-12-02 LAB — POCT GLYCOSYLATED HEMOGLOBIN (HGB A1C): Hemoglobin A1C: 7.5 % — AB (ref 4.0–5.6)

## 2023-12-02 NOTE — Patient Instructions (Signed)
 You will either be contacted via phone regarding your referral to , or you may receive a letter on your MyChart portal from our referral team with instructions for scheduling an appointment. Please let us  know if you have not been contacted by anyone within two weeks.  Start glipizide XL 5 mg once daily for diabetes.  Please schedule a follow up visit for 3 months.  It was a pleasure to see you today!

## 2023-12-02 NOTE — Progress Notes (Signed)
 Subjective:    Patient ID: Hannah Rocha, female    DOB: Aug 22, 1965, 58 y.o.   MRN: [id number]  Hannah Rocha is a very pleasant 58 y.o. female with a history of type 2 diabetes, OSA, hyperlipidemia, hypertension who presents today for follow-up of diabetes.  She would also like to discuss onychomycosis.  1) Type 2 Diabetes:   Current medications include: Metformin  ER 1000 mg daily.  Initiated on Rybelsus  previously but discontinued due to GI side effects.  She is checking her blood glucose 1 times daily and is getting readings of:  AM fasting: low to mid 100s  Last A1C: 7.1 in June 2025, 7.5 today Last Eye Exam: UTD Last Foot Exam: Up-to-date Pneumonia Vaccination: 2019 Urine Microalbumin: Up-to-date Statin: Rosuvastatin   Dietary changes since last visit:  She has cut out soft drinks and cut back on fried food.   Exercise: Walking some  Wt Readings from Last 3 Encounters:  12/02/23 181 lb (82.1 kg)  11/16/23 182 lb 6.4 oz (82.7 kg)  10/18/23 178 lb 3.2 oz (80.8 kg)     2) Onychomycosis: Currently managed on terbinafine  to 250 mg daily for which she has taken for 3 months. She's noticed some improvement but no resolve. She using an online treatment for her fungus without improvement. She finished her 3 months in August.    Review of Systems  Respiratory:  Negative for shortness of breath.   Cardiovascular:  Negative for chest pain.  Gastrointestinal:  Negative for diarrhea.  Neurological:  Negative for dizziness and numbness.         Past Medical History:  Diagnosis Date   Allergy    Anemia    hx   Anxiety    CARDIAC MURMUR, HX OF 05/03/2007   Annotation: ASD Qualifier: Diagnosis of  By: Bartley MD, Lamar Mulch    Cataract    FLOATING CATARACT    COVID-19 02/07/2021   De Quervain's tenosynovitis, right 03/29/2019   Diabetes mellitus without complication (HCC)    Dysplastic nevus 01/14/2015   right spinal mid back, severe, excised 03/04/2015    Dysplastic nevus 02/08/2018   right upper back/post shoulder, moderate   Dysplastic nevus 02/08/2018   left spinal mid upper back, severe, excised 03/21/2018   Fluttering sensation of heart 06/25/2021   Generalized abdominal pain 09/19/2021   GERD (gastroesophageal reflux disease)    H/O hiatal hernia    Heart murmur    ASD repair 1999- no meds    HIATAL HERNIA 07/09/2008   Qualifier: Diagnosis of  By: Kowalk CMA (AAMA), Leisha     Hx of dysplastic nevus    multiple sites, some severe   Hyperlipidemia    IBS 06/21/2009   Qualifier: Diagnosis of  By: Jakie MD NOLIA Alm SAUNDERS    MENOPAUSAL SYNDROME 01/15/2010   Qualifier: Diagnosis of  By: Jenetta MD, Talia     Muscle tightness 09/19/2021   Nausea vomiting and diarrhea 07/03/2022   Obesity    Plant allergic contact dermatitis 10/13/2018   PONV (postoperative nausea and vomiting)    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   Annotation: REPAIRED AS CHILD Qualifier: Diagnosis of  By: Bartley MD, Lamar Mulch    Viral URI with cough 11/30/2012    Social History   Socioeconomic History   Marital status: Married    Spouse name: Not on file   Number of children: 0   Years of education: Not on file   Highest education level:  12th grade  Occupational History   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    Employer: [employer]  Tobacco Use   Smoking status: Former    Current packs/day: 0.00    Types: Cigarettes    Quit date: 02/21/1987    Years since quitting: 36.8   Smokeless tobacco: Never   Tobacco comments:    22 years ago-light smoker  Vaping Use   Vaping status: Never Used  Substance and Sexual Activity   Alcohol use: Yes    Alcohol/week: 0.0 standard drinks of alcohol    Comment: occasional - hard apple cider, shot   Drug use: No   Sexual activity: Not Currently    Partners: Male    Birth control/protection: Surgical    Comment: tubaligation/hysterctomy  Other Topics Concern   Not on file   Social History Narrative   Daily caffeine use   Social Drivers of Health   Financial Resource Strain: Patient Declined (12/01/2023)   Overall Financial Resource Strain (CARDIA)    Difficulty of Paying Living Expenses: Patient declined  Food Insecurity: Patient Declined (12/01/2023)   Hunger Vital Sign    Worried About Running Out of Food in the Last Year: Patient declined    Ran Out of Food in the Last Year: Patient declined  Transportation Needs: Patient Declined (12/01/2023)   PRAPARE - Administrator, Civil Service (Medical): Patient declined    Lack of Transportation (Non-Medical): Patient declined  Physical Activity: Insufficiently Active (12/01/2023)   Exercise Vital Sign    Days of Exercise per Week: 5 days    Minutes of Exercise per Session: 20 min  Stress: No Stress Concern Present (12/01/2023)   [employer] of Occupational Health - Occupational Stress Questionnaire    Feeling of Stress: Not at all  Social Connections: Unknown (12/01/2023)   Social Connection and Isolation Panel    Frequency of Communication with Friends and Family: More than three times a week    Frequency of Social Gatherings with Friends and Family: Once a week    Attends Religious Services: Patient declined    Database administrator or Organizations: Patient declined    Attends Banker Meetings: Not on file    Marital Status: Married  Catering manager Violence: Not on file    Past Surgical History:  Procedure Laterality Date   ASD REPAIR  1999   [facility]   BREAST CYST ASPIRATION Right 2013   Carpal tunnel repair Right 11/27/2022   CATARACT EXTRACTION Bilateral    Right done September 2023 and Left January 08 2022   CATARACT EXTRACTION Bilateral 12/21/2021   CLEFT LIP REPAIR     COLONOSCOPY     COLONOSCOPY WITH PROPOFOL  N/A 12/31/2022   Procedure: COLONOSCOPY WITH PROPOFOL ;  Surgeon: Aneita Gwendlyn DASEN, MD;  Location: [facility];  Service: Gastroenterology;  Laterality:  N/A;   LAPAROSCOPIC ASSISTED VAGINAL HYSTERECTOMY Bilateral 04/13/2013   Procedure: LAPAROSCOPIC ASSISTED VAGINAL HYSTERECTOMY;  Surgeon: Harland JAYSON Birkenhead, MD;  Location: [facility];  Service: Gynecology;  Laterality: Bilateral;- pt states was abdominal hysterectomy    LAPAROSCOPIC TUBAL LIGATION  03/21/2012   Procedure: LAPAROSCOPIC TUBAL LIGATION;  Surgeon: Harland JAYSON Birkenhead, MD;  Location: [facility];  Service: Gynecology;  Laterality: Bilateral;   MANDIBLE FRACTURE SURGERY     Plantar Fascitis Repair Bilateral 01/2015    Family History  Problem Relation Age of Onset   Heart disease Mother    Colon polyps Mother    Diabetes Mother    Prostate  cancer Father    Heart disease Father    Cancer Father        prostate   Diabetes Maternal Aunt    Diabetes Paternal Grandmother    Fibromyalgia Sister    Colon cancer Neg Hx    Breast cancer Neg Hx    Esophageal cancer Neg Hx    Rectal cancer Neg Hx    Stomach cancer Neg Hx     Allergies  Allergen Reactions   Sertraline Hcl     REACTION: vomiting, severe anxiety    Current Outpatient Medications on File Prior to Visit  Medication Sig Dispense Refill   acetaminophen  (TYLENOL ) 500 MG tablet Take 500 mg by mouth every 6 (six) hours as needed for mild pain.      B Complex Vitamins (VITAMIN B COMPLEX PO) Take 1 tablet by mouth daily.     busPIRone  (BUSPAR ) 30 MG tablet TAKE 1 TABLET BY MOUTH EVERY MORNING 1/2 TAB AT LUNCH, AND 1/2 TAB AT BEDTIME FOR ANXIETY. 180 tablet 2   escitalopram  (LEXAPRO ) 10 MG tablet TAKE 1 TABLET (10 MG TOTAL) BY MOUTH DAILY. FOR ANXIETY. 90 tablet 2   flintstones complete (FLINTSTONES) 60 MG chewable tablet Chew 1 tablet by mouth daily.     fluticasone  (FLONASE ) 50 MCG/ACT nasal spray Place 2 sprays into both nostrils daily. 16 g 6   ibuprofen  (ADVIL ) 800 MG tablet TAKE 1 TABLET BY MOUTH EVERY 8 HOURS AS NEEDED 60 tablet 3   losartan -hydrochlorothiazide  (HYZAAR) 100-12.5 MG tablet Take 1 tablet by mouth daily. 90 tablet 0    metFORMIN  (GLUCOPHAGE -XR) 500 MG 24 hr tablet TAKE 2 TABLETS (1,000 MG TOTAL) BY MOUTH DAILY WITH BREAKFAST. FOR DIABETES. 180 tablet 0   pantoprazole  (PROTONIX ) 40 MG tablet Take 1 tablet (40 mg total) by mouth daily. 90 tablet 3   rosuvastatin  (CRESTOR ) 20 MG tablet Take 1 tablet (20 mg total) by mouth daily. 90 tablet 0   terbinafine  (LAMISIL ) 250 MG tablet TAKE 1 TABLET BY MOUTH EVERY DAY 30 tablet 0   famotidine  (PEPCID ) 20 MG tablet TAKE 1 TABLET (20 MG) BY MOUTH AT BEDTIME AS NEEDED FOR HEARTBURN OR INDIGESTION. FOR HEARTBURN. (Patient not taking: Reported on 12/02/2023) 90 tablet 2   ibuprofen  (ADVIL ) 800 MG tablet Take 1 tablet (800 mg total) by mouth every 8 (eight) hours as needed. (Patient not taking: Reported on 12/02/2023) 60 tablet 3   mupirocin  ointment (BACTROBAN ) 2 % Apply to affected area toe twice daily until improved. (Patient not taking: Reported on 12/02/2023) 22 g 0   No current facility-administered medications on file prior to visit.    BP (!) 142/70   Pulse 73   Temp (!) 97.2 F (36.2 C) (Temporal)   Ht 5' 4 (1.626 m)   Wt 181 lb (82.1 kg)   LMP 03/24/2013   SpO2 99%   BMI 31.07 kg/m  Objective:   Physical Exam Cardiovascular:     Rate and Rhythm: Normal rate and regular rhythm.  Pulmonary:     Effort: Pulmonary effort is normal.     Breath sounds: Normal breath sounds.  Musculoskeletal:     Cervical back: Neck supple.  Skin:    General: Skin is warm and dry.     Comments: Yellow, thickened toenails to right 1st and 2nd toes, left first, second, third toes.   Neurological:     Mental Status: She is alert and oriented to person, place, and time.  Psychiatric:  Mood and Affect: Mood normal.     Physical Exam        Assessment & Plan:  Type 2 diabetes mellitus with hyperglycemia, without long-term current use of insulin (HCC) Assessment & Plan: Increased with A1C today of 7.5.   We discussed options for increasing metformin , adding  Glipizide, and/or working on lifestyle changes. She opts for additional medication  Continue metformin  ER 1000 mg daily. Add glipizide XL 5 mg daily.  Follow-up in 3 months.  Orders: -     POCT glycosylated hemoglobin (Hb A1C)  Onychomycosis Assessment & Plan: Slight improvement but exam today reveals little improvement.  Referral placed to podiatry.   Orders: -     Ambulatory referral to Podiatry    Assessment and Plan Assessment & Plan         Comer MARLA Gaskins, NP   History of Present Illness

## 2023-12-02 NOTE — Assessment & Plan Note (Signed)
 Slight improvement but exam today reveals little improvement.  Referral placed to podiatry.

## 2023-12-02 NOTE — Assessment & Plan Note (Addendum)
 Increased with A1C today of 7.5.   We discussed options for increasing metformin , adding Glipizide, and/or working on lifestyle changes. She opts for additional medication  Continue metformin  ER 1000 mg daily. Add glipizide XL 5 mg daily.  Follow-up in 3 months.

## 2023-12-07 ENCOUNTER — Ambulatory Visit
Admission: RE | Admit: 2023-12-07 | Discharge: 2023-12-07 | Disposition: A | Discharge status: Home / Self Care | Source: Ambulatory Visit | Attending: Family Medicine | Admitting: Family Medicine

## 2023-12-07 DIAGNOSIS — Z1231 Encounter for screening mammogram for malignant neoplasm of breast: Secondary | ICD-10-CM | POA: Insufficient documentation

## 2023-12-08 DIAGNOSIS — E1165 Type 2 diabetes mellitus with hyperglycemia: Secondary | ICD-10-CM

## 2023-12-09 ENCOUNTER — Other Ambulatory Visit: Payer: Self-pay | Admitting: Cardiovascular Disease

## 2023-12-09 DIAGNOSIS — E785 Hyperlipidemia, unspecified: Secondary | ICD-10-CM

## 2023-12-09 MED ORDER — GLIPIZIDE ER 5 MG PO TB24: 5.0000 mg | ORAL_TABLET | Freq: Every day | ORAL | 1 refills | Status: DC

## 2023-12-16 ENCOUNTER — Ambulatory Visit: Admitting: Podiatry

## 2023-12-16 DIAGNOSIS — Z79899 Other long term (current) drug therapy: Secondary | ICD-10-CM

## 2023-12-16 DIAGNOSIS — B351 Tinea unguium: Secondary | ICD-10-CM | POA: Diagnosis not present

## 2023-12-16 NOTE — Progress Notes (Signed)
 Subjective:  Patient ID: Hannah Rocha, female    DOB: 1965/09/01,  MRN: [id number]  Chief Complaint  Patient presents with   Nail Problem    58 y.o. female presents with the above complaint.  Patient presents with bilateral hallux and second digit onychomycosis she states has been present for quite some time.  She got a prescription for Lamisil  from PCP and was told to follow-up here for further management.  She denies seeing anyone else prior to seeing me she states that she took a little bit of the Lamisil  she still has a good month or so left.  She denies any other acute complaints.  Would like to discuss treatment options for nail fungus   Review of Systems: Negative except as noted in the HPI. Denies N/V/F/Ch.  Past Medical History:  Diagnosis Date   Allergy    Anemia    hx   Anxiety    CARDIAC MURMUR, HX OF 05/03/2007   Annotation: ASD Qualifier: Diagnosis of  By: Bartley MD, Lamar Mulch    Cataract    FLOATING CATARACT    COVID-19 02/07/2021   De Quervain's tenosynovitis, right 03/29/2019   Diabetes mellitus without complication (HCC)    Dysplastic nevus 01/14/2015   right spinal mid back, severe, excised 03/04/2015   Dysplastic nevus 02/08/2018   right upper back/post shoulder, moderate   Dysplastic nevus 02/08/2018   left spinal mid upper back, severe, excised 03/21/2018   Fluttering sensation of heart 06/25/2021   Generalized abdominal pain 09/19/2021   GERD (gastroesophageal reflux disease)    H/O hiatal hernia    Heart murmur    ASD repair 1999- no meds    HIATAL HERNIA 07/09/2008   Qualifier: Diagnosis of  By: Kowalk CMA (AAMA), Leisha     Hx of dysplastic nevus    multiple sites, some severe   Hyperlipidemia    IBS 06/21/2009   Qualifier: Diagnosis of  By: Jakie MD NOLIA Alm SAUNDERS    MENOPAUSAL SYNDROME 01/15/2010   Qualifier: Diagnosis of  By: Jenetta MD, Talia     Muscle tightness 09/19/2021   Nausea vomiting and diarrhea 07/03/2022   Obesity     Plant allergic contact dermatitis 10/13/2018   PONV (postoperative nausea and vomiting)    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   Annotation: REPAIRED AS CHILD Qualifier: Diagnosis of  By: Bartley MD, Lamar Mulch    Viral URI with cough 11/30/2012    Current Outpatient Medications:    acetaminophen  (TYLENOL ) 500 MG tablet, Take 500 mg by mouth every 6 (six) hours as needed for mild pain. , Disp: , Rfl:    B Complex Vitamins (VITAMIN B COMPLEX PO), Take 1 tablet by mouth daily., Disp: , Rfl:    busPIRone  (BUSPAR ) 30 MG tablet, TAKE 1 TABLET BY MOUTH EVERY MORNING 1/2 TAB AT LUNCH, AND 1/2 TAB AT BEDTIME FOR ANXIETY., Disp: 180 tablet, Rfl: 2   escitalopram  (LEXAPRO ) 10 MG tablet, TAKE 1 TABLET (10 MG TOTAL) BY MOUTH DAILY. FOR ANXIETY., Disp: 90 tablet, Rfl: 2   famotidine  (PEPCID ) 20 MG tablet, TAKE 1 TABLET (20 MG) BY MOUTH AT BEDTIME AS NEEDED FOR HEARTBURN OR INDIGESTION. FOR HEARTBURN. (Patient not taking: Reported on 12/02/2023), Disp: 90 tablet, Rfl: 2   flintstones complete (FLINTSTONES) 60 MG chewable tablet, Chew 1 tablet by mouth daily., Disp: , Rfl:    fluticasone  (FLONASE ) 50 MCG/ACT nasal spray, Place 2 sprays into both nostrils daily., Disp: 16 g, Rfl: 6  glipiZIDE  (GLUCOTROL  XL) 5 MG 24 hr tablet, Take 1 tablet (5 mg total) by mouth daily with breakfast. for diabetes., Disp: 90 tablet, Rfl: 1   ibuprofen  (ADVIL ) 800 MG tablet, TAKE 1 TABLET BY MOUTH EVERY 8 HOURS AS NEEDED, Disp: 60 tablet, Rfl: 3   ibuprofen  (ADVIL ) 800 MG tablet, Take 1 tablet (800 mg total) by mouth every 8 (eight) hours as needed. (Patient not taking: Reported on 12/02/2023), Disp: 60 tablet, Rfl: 3   losartan -hydrochlorothiazide  (HYZAAR) 100-12.5 MG tablet, TAKE 1 TABLET BY MOUTH EVERY DAY, Disp: 15 tablet, Rfl: 0   metFORMIN  (GLUCOPHAGE -XR) 500 MG 24 hr tablet, TAKE 2 TABLETS (1,000 MG TOTAL) BY MOUTH DAILY WITH BREAKFAST. FOR DIABETES., Disp: 180 tablet, Rfl: 0   mupirocin  ointment  (BACTROBAN ) 2 %, Apply to affected area toe twice daily until improved. (Patient not taking: Reported on 12/02/2023), Disp: 22 g, Rfl: 0   pantoprazole  (PROTONIX ) 40 MG tablet, Take 1 tablet (40 mg total) by mouth daily., Disp: 90 tablet, Rfl: 3   rosuvastatin  (CRESTOR ) 20 MG tablet, TAKE 1 TABLET BY MOUTH EVERY DAY, Disp: 90 tablet, Rfl: 0   terbinafine  (LAMISIL ) 250 MG tablet, TAKE 1 TABLET BY MOUTH EVERY DAY, Disp: 30 tablet, Rfl: 0  Social History   Tobacco Use  Smoking Status Former   Current packs/day: 0.00   Types: Cigarettes   Quit date: 02/21/1987   Years since quitting: 36.8  Smokeless Tobacco Never  Tobacco Comments   22 years ago-light smoker    Allergies  Allergen Reactions   Sertraline Hcl     REACTION: vomiting, severe anxiety   Objective:  There were no vitals filed for this visit. There is no height or weight on file to calculate BMI. Constitutional Well developed. Well nourished.  Vascular Dorsalis pedis pulses palpable bilaterally. Posterior tibial pulses palpable bilaterally. Capillary refill normal to all digits.  No cyanosis or clubbing noted. Pedal hair growth normal.  Neurologic Normal speech. Oriented to person, place, and time. Epicritic sensation to light touch grossly present bilaterally.  Dermatologic Nails bilateral hallux and second digit onychomycosis thickened elongated dystrophic mycotic nail x 4 Skin within normal limits  Orthopedic: Normal joint ROM without pain or crepitus bilaterally. No visible deformities. No bony tenderness.   Radiographs: None Assessment:   1. Long-term use of high-risk medication   2. Nail fungus   3. Onychomycosis due to dermatophyte    Plan:  Patient was evaluated and treated and all questions answered.  Bilateral hallux onychomycosis -Educated the patient on the etiology of onychomycosis and various treatment options associated with improving the fungal load.  I explained to the patient that there is 3  treatment options available to treat the onychomycosis including topical, p.o., laser treatment.  Patient elected to undergo p.o. options with Lamisil /terbinafine  therapy.  In order for me to start the medication therapy, I explained to the patient the importance of evaluating the liver and obtaining the liver function test.  Once the liver function test comes back normal I will start him on 2-month course of Lamisil  therapy.  Patient understood all risk and would like to proceed with Lamisil  therapy.  I have asked the patient to immediately stop the Lamisil  therapy if she has any reactions to it and call the office or go to the emergency room right away.  Patient states understanding  -She will finish out her Lamisil  from PCP and I will see her back again in 4 to 5 weeks to redo the medication

## 2023-12-18 ENCOUNTER — Other Ambulatory Visit: Payer: Self-pay | Admitting: Cardiovascular Disease

## 2023-12-21 DIAGNOSIS — G4733 Obstructive sleep apnea (adult) (pediatric): Secondary | ICD-10-CM | POA: Diagnosis not present

## 2023-12-28 DIAGNOSIS — G5602 Carpal tunnel syndrome, left upper limb: Secondary | ICD-10-CM | POA: Diagnosis not present

## 2023-12-31 ENCOUNTER — Other Ambulatory Visit: Payer: Self-pay | Admitting: Cardiovascular Disease

## 2024-01-06 ENCOUNTER — Other Ambulatory Visit: Payer: Self-pay | Admitting: Primary Care

## 2024-01-06 DIAGNOSIS — E119 Type 2 diabetes mellitus without complications: Secondary | ICD-10-CM

## 2024-01-13 ENCOUNTER — Encounter: Payer: Self-pay | Admitting: Primary Care

## 2024-01-13 ENCOUNTER — Ambulatory Visit: Admitting: Primary Care

## 2024-01-13 ENCOUNTER — Ambulatory Visit: Admitting: Podiatry

## 2024-01-13 VITALS — BP 142/66 | HR 86 | Temp 97.6°F | Ht 64.0 in | Wt 186.0 lb

## 2024-01-13 DIAGNOSIS — B351 Tinea unguium: Secondary | ICD-10-CM | POA: Diagnosis not present

## 2024-01-13 DIAGNOSIS — Z79899 Other long term (current) drug therapy: Secondary | ICD-10-CM | POA: Diagnosis not present

## 2024-01-13 DIAGNOSIS — H6991 Unspecified Eustachian tube disorder, right ear: Secondary | ICD-10-CM | POA: Diagnosis not present

## 2024-01-13 NOTE — Assessment & Plan Note (Signed)
 Likely allergy induced  Continue Flonase  twice daily. Switch to Xyzal or Zyrtec.  Consider prednisone  in the future if no improvement. She will update

## 2024-01-13 NOTE — Patient Instructions (Signed)
 Try switching to either levocetirizine (Xyzal) for cetirizine (Zyrtec) for allergies.  Continue Flonase  nasal spray.  Please update if no improvement within the next several days.  It was a pleasure to see you today!

## 2024-01-13 NOTE — Progress Notes (Signed)
 Subjective:  Patient ID: Hannah Rocha, female    DOB: 02-24-1966,  MRN: [id number]  Chief Complaint  Patient presents with   Nail Problem    Nail fungus follow up     58 y.o. female presents with the above complaint.  Patient presents with bilateral hallux and second digit onychomycosis she states she had noted some improvement she has completed her Lamisil  medication denies any other acute complaints.   Review of Systems: Negative except as noted in the HPI. Denies N/V/F/Ch.  Past Medical History:  Diagnosis Date   Allergy    Anemia    hx   Anxiety    CARDIAC MURMUR, HX OF 05/03/2007   Annotation: ASD Qualifier: Diagnosis of  By: Bartley MD, Lamar Mulch    Cataract    FLOATING CATARACT    COVID-19 02/07/2021   De Quervain's tenosynovitis, right 03/29/2019   Diabetes mellitus without complication (HCC)    Dysplastic nevus 01/14/2015   right spinal mid back, severe, excised 03/04/2015   Dysplastic nevus 02/08/2018   right upper back/post shoulder, moderate   Dysplastic nevus 02/08/2018   left spinal mid upper back, severe, excised 03/21/2018   Fluttering sensation of heart 06/25/2021   Generalized abdominal pain 09/19/2021   GERD (gastroesophageal reflux disease)    H/O hiatal hernia    Heart murmur    ASD repair 1999- no meds    HIATAL HERNIA 07/09/2008   Qualifier: Diagnosis of  By: Kowalk CMA (AAMA), Leisha     Hx of dysplastic nevus    multiple sites, some severe   Hyperlipidemia    IBS 06/21/2009   Qualifier: Diagnosis of  By: Jakie MD NOLIA Alm SAUNDERS    MENOPAUSAL SYNDROME 01/15/2010   Qualifier: Diagnosis of  By: Jenetta MD, Talia     Muscle tightness 09/19/2021   Nausea vomiting and diarrhea 07/03/2022   Obesity    Plant allergic contact dermatitis 10/13/2018   PONV (postoperative nausea and vomiting)    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   Annotation: REPAIRED AS CHILD Qualifier: Diagnosis of  By: Bartley MD, Lamar Mulch     Viral URI with cough 11/30/2012    Current Outpatient Medications:    terbinafine  (LAMISIL ) 250 MG tablet, Take 1 tablet (250 mg total) by mouth daily., Disp: 90 tablet, Rfl: 0   acetaminophen  (TYLENOL ) 500 MG tablet, Take 500 mg by mouth every 6 (six) hours as needed for mild pain. , Disp: , Rfl:    B Complex Vitamins (VITAMIN B COMPLEX PO), Take 1 tablet by mouth daily., Disp: , Rfl:    busPIRone  (BUSPAR ) 30 MG tablet, TAKE 1 TABLET BY MOUTH EVERY MORNING 1/2 TAB AT LUNCH, AND 1/2 TAB AT BEDTIME FOR ANXIETY., Disp: 180 tablet, Rfl: 2   escitalopram  (LEXAPRO ) 10 MG tablet, TAKE 1 TABLET (10 MG TOTAL) BY MOUTH DAILY. FOR ANXIETY., Disp: 90 tablet, Rfl: 2   famotidine  (PEPCID ) 20 MG tablet, TAKE 1 TABLET (20 MG) BY MOUTH AT BEDTIME AS NEEDED FOR HEARTBURN OR INDIGESTION. FOR HEARTBURN. (Patient not taking: Reported on 01/19/2024), Disp: 90 tablet, Rfl: 2   flintstones complete (FLINTSTONES) 60 MG chewable tablet, Chew 1 tablet by mouth daily., Disp: , Rfl:    fluticasone  (FLONASE ) 50 MCG/ACT nasal spray, Place 2 sprays into both nostrils daily., Disp: 16 g, Rfl: 6   glipiZIDE  (GLUCOTROL  XL) 5 MG 24 hr tablet, Take 1 tablet (5 mg total) by mouth daily with breakfast. for diabetes., Disp: 90 tablet, Rfl:  1   ibuprofen  (ADVIL ) 800 MG tablet, TAKE 1 TABLET BY MOUTH EVERY 8 HOURS AS NEEDED, Disp: 60 tablet, Rfl: 3   ibuprofen  (ADVIL ) 800 MG tablet, Take 1 tablet (800 mg total) by mouth every 8 (eight) hours as needed. (Patient not taking: Reported on 01/19/2024), Disp: 60 tablet, Rfl: 3   losartan -hydrochlorothiazide  (HYZAAR) 100-12.5 MG tablet, TAKE 1 TABLET BY MOUTH EVERY DAY, Disp: 15 tablet, Rfl: 0   metFORMIN  (GLUCOPHAGE -XR) 500 MG 24 hr tablet, TAKE 2 TABLETS (1,000 MG TOTAL) BY MOUTH DAILY WITH BREAKFAST. FOR DIABETES., Disp: 180 tablet, Rfl: 1   mupirocin  ointment (BACTROBAN ) 2 %, Apply to affected area toe twice daily until improved. (Patient not taking: Reported on 01/19/2024), Disp: 22 g, Rfl:  0   pantoprazole  (PROTONIX ) 40 MG tablet, Take 1 tablet (40 mg total) by mouth daily., Disp: 90 tablet, Rfl: 3   rosuvastatin  (CRESTOR ) 20 MG tablet, TAKE 1 TABLET BY MOUTH EVERY DAY, Disp: 90 tablet, Rfl: 0   terbinafine  (LAMISIL ) 250 MG tablet, TAKE 1 TABLET BY MOUTH EVERY DAY, Disp: 30 tablet, Rfl: 0  Social History   Tobacco Use  Smoking Status Former   Current packs/day: 0.00   Types: Cigarettes   Quit date: 02/21/1987   Years since quitting: 36.9  Smokeless Tobacco Never  Tobacco Comments   22 years ago-light smoker    Allergies  Allergen Reactions   Sertraline Hcl     REACTION: vomiting, severe anxiety   Objective:  There were no vitals filed for this visit. There is no height or weight on file to calculate BMI. Constitutional Well developed. Well nourished.  Vascular Dorsalis pedis pulses palpable bilaterally. Posterior tibial pulses palpable bilaterally. Capillary refill normal to all digits.  No cyanosis or clubbing noted. Pedal hair growth normal.  Neurologic Normal speech. Oriented to person, place, and time. Epicritic sensation to light touch grossly present bilaterally.  Dermatologic Nails bilateral hallux and second digit onychomycosis thickened elongated dystrophic mycotic nail x 4.  Slight improvement Skin within normal limits  Orthopedic: Normal joint ROM without pain or crepitus bilaterally. No visible deformities. No bony tenderness.   Radiographs: None Assessment:   1. Long-term use of high-risk medication   2. Nail fungus   3. Onychomycosis due to dermatophyte    Plan:  Patient was evaluated and treated and all questions answered.  Bilateral hallux onychomycosis~second round -Educated the patient on the etiology of onychomycosis and various treatment options associated with improving the fungal load.  I explained to the patient that there is 3 treatment options available to treat the onychomycosis including topical, p.o., laser treatment.   Patient elected to undergo p.o. options with Lamisil /terbinafine  therapy.  In order for me to start the medication therapy, I explained to the patient the importance of evaluating the liver and obtaining the liver function test.  Once the liver function test comes back normal I will start him on 5-month course of Lamisil  therapy.  Patient understood all risk and would like to proceed with Lamisil  therapy.  I have asked the patient to immediately stop the Lamisil  therapy if she has any reactions to it and call the office or go to the emergency room right away.  Patient states understanding

## 2024-01-13 NOTE — Progress Notes (Signed)
 Subjective:    Patient ID: Hannah Rocha, female    DOB: December 16, 1965, 58 y.o.   MRN: [id number]  Hannah Rocha is a very pleasant 58 y.o. female waiting with a history of hypertension, OSA, type 2 diabetes, eustachian tube dysfunction who presents today to discuss ear fullness.  Symptom onset last week with right ear pain.  Her pain is since resolved but her right ear feels full. She denies fevers, chills, cough. She does not wear ear buds.  She has been using Flonase  twice daily and taking Alavert daily.  She has been managed on Alavert for years   Review of Systems  Constitutional:  Negative for chills, fatigue and fever.  HENT:  Negative for congestion, ear pain, postnasal drip and sinus pressure.        Ear fullness  Respiratory:  Negative for cough.          Past Medical History:  Diagnosis Date   Allergy    Anemia    hx   Anxiety    CARDIAC MURMUR, HX OF 05/03/2007   Annotation: ASD Qualifier: Diagnosis of  By: Bartley MD, Lamar Mulch    Cataract    FLOATING CATARACT    COVID-19 02/07/2021   De Quervain's tenosynovitis, right 03/29/2019   Diabetes mellitus without complication (HCC)    Dysplastic nevus 01/14/2015   right spinal mid back, severe, excised 03/04/2015   Dysplastic nevus 02/08/2018   right upper back/post shoulder, moderate   Dysplastic nevus 02/08/2018   left spinal mid upper back, severe, excised 03/21/2018   Fluttering sensation of heart 06/25/2021   Generalized abdominal pain 09/19/2021   GERD (gastroesophageal reflux disease)    H/O hiatal hernia    Heart murmur    ASD repair 1999- no meds    HIATAL HERNIA 07/09/2008   Qualifier: Diagnosis of  By: Kowalk CMA (AAMA), Leisha     Hx of dysplastic nevus    multiple sites, some severe   Hyperlipidemia    IBS 06/21/2009   Qualifier: Diagnosis of  By: Jakie MD NOLIA Alm SAUNDERS    MENOPAUSAL SYNDROME 01/15/2010   Qualifier: Diagnosis of  By: Jenetta MD, Talia     Muscle tightness 09/19/2021    Nausea vomiting and diarrhea 07/03/2022   Obesity    Plant allergic contact dermatitis 10/13/2018   PONV (postoperative nausea and vomiting)    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   Annotation: REPAIRED AS CHILD Qualifier: Diagnosis of  By: Bartley MD, Lamar Mulch    Viral URI with cough 11/30/2012    Social History   Socioeconomic History   Marital status: Married    Spouse name: Not on file   Number of children: 0   Years of education: Not on file   Highest education level: 12th grade  Occupational History   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    Employer: [employer]  Tobacco Use   Smoking status: Former    Current packs/day: 0.00    Types: Cigarettes    Quit date: 02/21/1987    Years since quitting: 36.9   Smokeless tobacco: Never   Tobacco comments:    22 years ago-light smoker  Vaping Use   Vaping status: Never Used  Substance and Sexual Activity   Alcohol use: Yes    Alcohol/week: 0.0 standard drinks of alcohol    Comment: occasional - hard apple cider, shot   Drug use: No   Sexual activity: Not Currently    Partners:  Male    Birth control/protection: Surgical    Comment: tubaligation/hysterctomy  Other Topics Concern   Not on file  Social History Narrative   Daily caffeine use   Social Drivers of Health   Financial Resource Strain: Patient Declined (12/01/2023)   Overall Financial Resource Strain (CARDIA)    Difficulty of Paying Living Expenses: Patient declined  Food Insecurity: Patient Declined (12/01/2023)   Hunger Vital Sign    Worried About Running Out of Food in the Last Year: Patient declined    Ran Out of Food in the Last Year: Patient declined  Transportation Needs: Patient Declined (12/01/2023)   PRAPARE - Administrator, Civil Service (Medical): Patient declined    Lack of Transportation (Non-Medical): Patient declined  Physical Activity: Insufficiently Active (12/01/2023)   Exercise Vital Sign    Days of  Exercise per Week: 5 days    Minutes of Exercise per Session: 20 min  Stress: No Stress Concern Present (12/01/2023)   [employer] of Occupational Health - Occupational Stress Questionnaire    Feeling of Stress: Not at all  Social Connections: Unknown (12/01/2023)   Social Connection and Isolation Panel    Frequency of Communication with Friends and Family: More than three times a week    Frequency of Social Gatherings with Friends and Family: Once a week    Attends Religious Services: Patient declined    Database administrator or Organizations: Patient declined    Attends Banker Meetings: Not on file    Marital Status: Married  Catering manager Violence: Not on file    Past Surgical History:  Procedure Laterality Date   ASD REPAIR  1999   [facility]   BREAST CYST ASPIRATION Right 2013   Carpal tunnel repair Right 11/27/2022   CATARACT EXTRACTION Bilateral    Right done September 2023 and Left January 08 2022   CATARACT EXTRACTION Bilateral 12/21/2021   CLEFT LIP REPAIR     COLONOSCOPY     COLONOSCOPY WITH PROPOFOL  N/A 12/31/2022   Procedure: COLONOSCOPY WITH PROPOFOL ;  Surgeon: Aneita Gwendlyn DASEN, MD;  Location: [facility];  Service: Gastroenterology;  Laterality: N/A;   LAPAROSCOPIC ASSISTED VAGINAL HYSTERECTOMY Bilateral 04/13/2013   Procedure: LAPAROSCOPIC ASSISTED VAGINAL HYSTERECTOMY;  Surgeon: Harland JAYSON Birkenhead, MD;  Location: [facility];  Service: Gynecology;  Laterality: Bilateral;- pt states was abdominal hysterectomy    LAPAROSCOPIC TUBAL LIGATION  03/21/2012   Procedure: LAPAROSCOPIC TUBAL LIGATION;  Surgeon: Harland JAYSON Birkenhead, MD;  Location: [facility];  Service: Gynecology;  Laterality: Bilateral;   MANDIBLE FRACTURE SURGERY     Plantar Fascitis Repair Bilateral 01/2015    Family History  Problem Relation Age of Onset   Heart disease Mother    Colon polyps Mother    Diabetes Mother    Prostate cancer Father    Heart disease Father    Cancer Father        prostate    Diabetes Maternal Aunt    Diabetes Paternal Grandmother    Fibromyalgia Sister    Colon cancer Neg Hx    Breast cancer Neg Hx    Esophageal cancer Neg Hx    Rectal cancer Neg Hx    Stomach cancer Neg Hx     Allergies  Allergen Reactions   Sertraline Hcl     REACTION: vomiting, severe anxiety    Current Outpatient Medications on File Prior to Visit  Medication Sig Dispense Refill   acetaminophen  (TYLENOL ) 500 MG tablet Take 500 mg  by mouth every 6 (six) hours as needed for mild pain.      B Complex Vitamins (VITAMIN B COMPLEX PO) Take 1 tablet by mouth daily.     busPIRone  (BUSPAR ) 30 MG tablet TAKE 1 TABLET BY MOUTH EVERY MORNING 1/2 TAB AT LUNCH, AND 1/2 TAB AT BEDTIME FOR ANXIETY. 180 tablet 2   escitalopram  (LEXAPRO ) 10 MG tablet TAKE 1 TABLET (10 MG TOTAL) BY MOUTH DAILY. FOR ANXIETY. 90 tablet 2   flintstones complete (FLINTSTONES) 60 MG chewable tablet Chew 1 tablet by mouth daily.     fluticasone  (FLONASE ) 50 MCG/ACT nasal spray Place 2 sprays into both nostrils daily. 16 g 6   glipiZIDE  (GLUCOTROL  XL) 5 MG 24 hr tablet Take 1 tablet (5 mg total) by mouth daily with breakfast. for diabetes. 90 tablet 1   ibuprofen  (ADVIL ) 800 MG tablet TAKE 1 TABLET BY MOUTH EVERY 8 HOURS AS NEEDED 60 tablet 3   losartan -hydrochlorothiazide  (HYZAAR) 100-12.5 MG tablet TAKE 1 TABLET BY MOUTH EVERY DAY 15 tablet 0   metFORMIN  (GLUCOPHAGE -XR) 500 MG 24 hr tablet TAKE 2 TABLETS (1,000 MG TOTAL) BY MOUTH DAILY WITH BREAKFAST. FOR DIABETES. 180 tablet 1   pantoprazole  (PROTONIX ) 40 MG tablet Take 1 tablet (40 mg total) by mouth daily. 90 tablet 3   rosuvastatin  (CRESTOR ) 20 MG tablet TAKE 1 TABLET BY MOUTH EVERY DAY 90 tablet 0   terbinafine  (LAMISIL ) 250 MG tablet TAKE 1 TABLET BY MOUTH EVERY DAY 30 tablet 0   famotidine  (PEPCID ) 20 MG tablet TAKE 1 TABLET (20 MG) BY MOUTH AT BEDTIME AS NEEDED FOR HEARTBURN OR INDIGESTION. FOR HEARTBURN. (Patient not taking: Reported on 01/13/2024) 90 tablet 2    ibuprofen  (ADVIL ) 800 MG tablet Take 1 tablet (800 mg total) by mouth every 8 (eight) hours as needed. (Patient not taking: Reported on 01/13/2024) 60 tablet 3   mupirocin  ointment (BACTROBAN ) 2 % Apply to affected area toe twice daily until improved. (Patient not taking: Reported on 01/13/2024) 22 g 0   No current facility-administered medications on file prior to visit.    BP (!) 142/66   Pulse 86   Temp 97.6 F (36.4 C) (Temporal)   Ht 5' 4 (1.626 m)   Wt 186 lb (84.4 kg)   LMP 03/24/2013   SpO2 97%   BMI 31.93 kg/m  Objective:   Physical Exam HENT:     Right Ear: Ear canal normal. A middle ear effusion is present. There is no impacted cerumen. Tympanic membrane is not erythematous.     Left Ear: Tympanic membrane normal. There is no impacted cerumen.  Cardiovascular:     Rate and Rhythm: Normal rate.  Pulmonary:     Effort: Pulmonary effort is normal.  Neurological:     Mental Status: She is alert.     Physical Exam        Assessment & Plan:  Dysfunction of right eustachian tube Assessment & Plan: Likely allergy induced  Continue Flonase  twice daily. Switch to Xyzal or Zyrtec.  Consider prednisone  in the future if no improvement. She will update     Assessment and Plan Assessment & Plan         Hannah MARLA Gaskins, NP     History of Present Illness

## 2024-01-18 ENCOUNTER — Other Ambulatory Visit: Payer: Self-pay | Admitting: Cardiovascular Disease

## 2024-01-18 LAB — HEPATIC FUNCTION PANEL
ALT: 21 IU/L (ref 0–32)
AST: 16 IU/L (ref 0–40)
Albumin: 4.5 g/dL (ref 3.8–4.9)
Alkaline Phosphatase: 103 IU/L (ref 49–135)
Bilirubin Total: 0.2 mg/dL (ref 0.0–1.2)
Bilirubin, Direct: 0.09 mg/dL (ref 0.00–0.40)
Total Protein: 7 g/dL (ref 6.0–8.5)

## 2024-01-18 MED ORDER — TERBINAFINE HCL 250 MG PO TABS: 250.0000 mg | ORAL_TABLET | Freq: Every day | ORAL | 0 refills | Status: DC

## 2024-01-19 ENCOUNTER — Encounter: Payer: Self-pay | Admitting: Sleep Medicine

## 2024-01-19 ENCOUNTER — Ambulatory Visit: Admitting: Sleep Medicine

## 2024-01-19 VITALS — BP 120/70 | HR 64 | Temp 98.0°F | Ht 64.0 in | Wt 187.6 lb

## 2024-01-19 DIAGNOSIS — Z87891 Personal history of nicotine dependence: Secondary | ICD-10-CM

## 2024-01-19 DIAGNOSIS — I10 Essential (primary) hypertension: Secondary | ICD-10-CM

## 2024-01-19 DIAGNOSIS — G4733 Obstructive sleep apnea (adult) (pediatric): Secondary | ICD-10-CM

## 2024-01-19 NOTE — Patient Instructions (Addendum)

## 2024-01-19 NOTE — Progress Notes (Signed)
 Name:Hannah Rocha MRN: [id number] DOB: [birth year]   CHIEF COMPLAINT:  CPAP F/U   HISTORY OF PRESENT ILLNESS:  Hannah Rocha is a 58 y.o. w/ a h/o OSA, HTN and obesity who presents for CPAP follow up visit. Reports using CPAP therapy every night, which is confirmed by compliance data. She is currently using the Airfit N30i nasal mask, which is comfortable. Reports feeling more refreshed upon awakening with CPAP therapy.     EPWORTH SLEEP SCORE    09/08/2023    3:31 PM  Results of the Epworth flowsheet  Sitting and reading 0  Watching TV 1  Sitting, inactive in a public place (e.g. a theatre or a meeting) 0  As a passenger in a car for an hour without a break 0  Lying down to rest in the afternoon when circumstances permit 3  Sitting and talking to someone 0  Sitting quietly after a lunch without alcohol 0  In a car, while stopped for a few minutes in traffic 0  Total score 4    PAST MEDICAL HISTORY :   has a past medical history of Allergy, Anemia, Anxiety, CARDIAC MURMUR, HX OF (05/03/2007), Cataract, COVID-19 (02/07/2021), De Quervain's tenosynovitis, right (03/29/2019), Diabetes mellitus without complication (HCC), Dysplastic nevus (01/14/2015), Dysplastic nevus (02/08/2018), Dysplastic nevus (02/08/2018), Fluttering sensation of heart (06/25/2021), Generalized abdominal pain (09/19/2021), GERD (gastroesophageal reflux disease), H/O hiatal hernia, Heart murmur, HIATAL HERNIA (07/09/2008), dysplastic nevus, Hyperlipidemia, IBS (06/21/2009), MENOPAUSAL SYNDROME (01/15/2010), Muscle tightness (09/19/2021), Nausea vomiting and diarrhea (07/03/2022), Obesity, Plant allergic contact dermatitis (10/13/2018), PONV (postoperative nausea and vomiting), 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 (05/03/2007), and Viral URI with cough (11/30/2012).  has a past surgical history that includes ASD repair (1999); Mandible fracture surgery; Cleft lip repair; Laparoscopic tubal ligation  (03/21/2012); Laparoscopic assisted vaginal hysterectomy (Bilateral, 04/13/2013); Plantar Fascitis Repair (Bilateral, 01/2015); Colonoscopy; Breast cyst aspiration (Right, 2013); Cataract extraction (Bilateral); Cataract extraction (Bilateral, 12/21/2021); Colonoscopy with propofol  (N/A, 12/31/2022); and Carpal tunnel repair (Right, 11/27/2022). Prior to Admission medications   Medication Sig Start Date End Date Taking? Authorizing Provider  acetaminophen  (TYLENOL ) 500 MG tablet Take 500 mg by mouth every 6 (six) hours as needed for mild pain.    Yes [provider]  B Complex Vitamins (VITAMIN B COMPLEX PO) Take 1 tablet by mouth daily.   Yes [provider]  busPIRone  (BUSPAR ) 30 MG tablet TAKE 1 TABLET BY MOUTH EVERY MORNING 1/2 TAB AT LUNCH, AND 1/2 TAB AT BEDTIME FOR ANXIETY. 06/13/23  Yes Clark, Katherine K, NP  escitalopram  (LEXAPRO ) 10 MG tablet TAKE 1 TABLET (10 MG TOTAL) BY MOUTH DAILY. FOR ANXIETY. 07/11/23  Yes Clark, Katherine K, NP  famotidine  (PEPCID ) 20 MG tablet TAKE 1 TABLET (20 MG) BY MOUTH AT BEDTIME AS NEEDED FOR HEARTBURN OR INDIGESTION. FOR HEARTBURN. 06/23/23  Yes Gretta Comer POUR, NP  flintstones complete (FLINTSTONES) 60 MG chewable tablet Chew 1 tablet by mouth daily.   Yes [provider]  fluticasone  (FLONASE ) 50 MCG/ACT nasal spray Place 2 sprays into both nostrils daily. 12/14/22  Yes Tower, Laine LABOR, MD  ibuprofen  (ADVIL ) 800 MG tablet TAKE 1 TABLET BY MOUTH EVERY 8 HOURS AS NEEDED 09/10/23  Yes Fredirick Glenys RAMAN, MD  ibuprofen  (ADVIL ) 800 MG tablet Take 1 tablet (800 mg total) by mouth every 8 (eight) hours as needed. 04/05/23  Yes Fredirick Glenys RAMAN, MD  losartan -hydrochlorothiazide  (HYZAAR) 100-12.5 MG tablet Take 1 tablet by mouth daily. 09/17/23  Yes  Nishan, Peter C, MD  metFORMIN  (GLUCOPHAGE -XR) 500 MG 24 hr tablet TAKE 2 TABLETS (1,000 MG TOTAL) BY MOUTH DAILY WITH BREAKFAST. FOR DIABETES. 10/10/23  Yes Clark, Katherine K, NP  mupirocin  ointment  (BACTROBAN ) 2 % Apply to affected area toe twice daily until improved. 04/19/23  Yes Jackquline Sawyer, MD  pantoprazole  (PROTONIX ) 40 MG tablet Take 1 tablet (40 mg total) by mouth daily. 12/31/22  Yes Aneita Gwendlyn DASEN, MD  rosuvastatin  (CRESTOR ) 20 MG tablet Take 1 tablet (20 mg total) by mouth daily. 09/07/23  Yes Delford Maude BROCKS, MD  terbinafine  (LAMISIL ) 250 MG tablet TAKE 1 TABLET BY MOUTH EVERY DAY 10/01/23  Yes Clark, Katherine K, NP   Allergies  Allergen Reactions   Sertraline Hcl     REACTION: vomiting, severe anxiety    FAMILY HISTORY:  family history includes Cancer in her father; Colon polyps in her mother; Diabetes in her maternal aunt, mother, and paternal grandmother; Fibromyalgia in her sister; Heart disease in her father and mother; Prostate cancer in her father. SOCIAL HISTORY:  reports that she quit smoking about 36 years ago. Her smoking use included cigarettes. She has never used smokeless tobacco. She reports current alcohol use. She reports that she does not use drugs.   Review of Systems:  Gen:  Denies  fever, sweats, chills weight loss  HEENT: Denies blurred vision, double vision, ear pain, eye pain, hearing loss, nose bleeds, sore throat Cardiac:  No dizziness, chest pain or heaviness, chest tightness,edema, No JVD Resp:   No cough, -sputum production, -shortness of breath,-wheezing, -hemoptysis,  Gi: Denies swallowing difficulty, stomach pain, nausea or vomiting, diarrhea, constipation, bowel incontinence Gu:  Denies bladder incontinence, burning urine Ext:   Denies Joint pain, stiffness or swelling Skin: Denies  skin rash, easy bruising or bleeding or hives Endoc:  Denies polyuria, polydipsia , polyphagia or weight change Psych:   Denies depression, insomnia or hallucinations  Other:  All other systems negative  VITAL SIGNS: LMP 03/24/2013  BP 120/70   Pulse 64   Temp 98 F (36.7 C)   Ht 5' 4 (1.626 m)   Wt 187 lb 9.6 oz (85.1 kg)   LMP 03/24/2013   SpO2  97%   BMI 32.20 kg/m     Physical Examination:   General Appearance: No distress  EYES PERRLA, EOM intact.   NECK Supple, No JVD Pulmonary: normal breath sounds, No wheezing.  CardiovascularNormal S1,S2.  No m/r/g.   Abdomen: Benign, Soft, non-tender. Skin:   warm, no rashes, no ecchymosis  Extremities: normal, no cyanosis, clubbing. Neuro:without focal findings,  speech normal  PSYCHIATRIC: Mood, affect within normal limits.   ASSESSMENT AND PLAN  OSA Patient is using and benefiting from CPAP therapy. Discussed the consequences of untreated sleep apnea. Advised not to drive drowsy for safety of patient and others. Will follow up in 6 months.   HTN Stable, on current management. Following with PCP.    Patient  satisfied with Plan of action and management. All questions answered  I spent a total of 31 minutes reviewing chart data, face-to-face evaluation with the patient, counseling and coordination of care as detailed above.    Meryle Pugmire, M.D.  Sleep Medicine [facility]

## 2024-01-20 DIAGNOSIS — G5602 Carpal tunnel syndrome, left upper limb: Secondary | ICD-10-CM | POA: Diagnosis not present

## 2024-01-20 DIAGNOSIS — G5622 Lesion of ulnar nerve, left upper limb: Secondary | ICD-10-CM | POA: Diagnosis not present

## 2024-02-08 DIAGNOSIS — E119 Type 2 diabetes mellitus without complications: Secondary | ICD-10-CM | POA: Diagnosis not present

## 2024-02-08 DIAGNOSIS — I10 Essential (primary) hypertension: Secondary | ICD-10-CM | POA: Diagnosis not present

## 2024-02-08 DIAGNOSIS — G5602 Carpal tunnel syndrome, left upper limb: Secondary | ICD-10-CM | POA: Diagnosis not present

## 2024-02-10 ENCOUNTER — Other Ambulatory Visit: Payer: Self-pay | Admitting: Cardiovascular Disease

## 2024-02-14 NOTE — Telephone Encounter (Signed)
 Pt of Dr. Delford. Passed 3rd attempt. Does Dr. Delford want to refill? Please advise.

## 2024-02-15 ENCOUNTER — Telehealth: Payer: Self-pay | Admitting: Cardiovascular Disease

## 2024-02-15 NOTE — Telephone Encounter (Signed)
*  STAT* If patient is at the pharmacy, call can be transferred to refill team.   1. Which medications need to be refilled? (please list name of each medication and dose if known)   losartan -hydrochlorothiazide  (HYZAAR) 100-12.5 MG tablet    2. Which pharmacy/location (including street and city if local pharmacy) is medication to be sent to?  [id number] - [place], [place] - [address]    3. Do they need a 30 day or 90 day supply? 90   Patient has appt on 1/23

## 2024-02-16 MED ORDER — LOSARTAN POTASSIUM-HCTZ 100-12.5 MG PO TABS: 1.0000 | ORAL_TABLET | Freq: Every day | ORAL | 1 refills | Status: DC

## 2024-02-16 NOTE — Telephone Encounter (Signed)
 Pt scheduled to see Dr. Delford 04/14/24, refill sent.

## 2024-02-18 DIAGNOSIS — G4733 Obstructive sleep apnea (adult) (pediatric): Secondary | ICD-10-CM | POA: Diagnosis not present

## 2024-02-20 DIAGNOSIS — G4733 Obstructive sleep apnea (adult) (pediatric): Secondary | ICD-10-CM | POA: Diagnosis not present

## 2024-03-03 ENCOUNTER — Ambulatory Visit: Admitting: Gastroenterology

## 2024-03-03 ENCOUNTER — Encounter: Payer: Self-pay | Admitting: Gastroenterology

## 2024-03-03 VITALS — BP 146/72 | HR 82 | Ht 64.0 in | Wt 182.4 lb

## 2024-03-03 DIAGNOSIS — K219 Gastro-esophageal reflux disease without esophagitis: Secondary | ICD-10-CM

## 2024-03-03 MED ORDER — PANTOPRAZOLE SODIUM 40 MG PO TBEC: 40.0000 mg | DELAYED_RELEASE_TABLET | Freq: Every day | ORAL | 3 refills | Status: AC

## 2024-03-03 NOTE — Progress Notes (Signed)
 03/03/2024 Hannah Rocha Finder [id number]  Discussed the use of AI scribe software for clinical note transcription with the patient, who gave verbal consent to proceed.  History of Present Illness Hannah Rocha is a 58 year old female who presents for a Protonix  refill.  She was a patient of Dr. Albin, her care will be assigned to Dr. Legrand.  She has been on Protonix  for years, taking it once daily, and reports doing well on the medication without experiencing any symptoms related to her condition. She confirms being symptom-free while on Protonix .  Her prescription is typically filled for 90 days at a time, and [facility] [address] in [place].  She reports that she is not having any symptoms while taking Protonix .  UTD with colonoscopy, performed just last year.   Past Medical History:  Diagnosis Date   Allergy    Anemia    hx   Anxiety    CARDIAC MURMUR, HX OF 05/03/2007   Annotation: ASD Qualifier: Diagnosis of  By: Bartley MD, Lamar Mulch    Cataract    FLOATING CATARACT    COVID-19 02/07/2021   De Quervain's tenosynovitis, right 03/29/2019   Diabetes mellitus without complication (HCC)    Dysplastic nevus 01/14/2015   right spinal mid back, severe, excised 03/04/2015   Dysplastic nevus 02/08/2018   right upper back/post shoulder, moderate   Dysplastic nevus 02/08/2018   left spinal mid upper back, severe, excised 03/21/2018   Fluttering sensation of heart 06/25/2021   Generalized abdominal pain 09/19/2021   GERD (gastroesophageal reflux disease)    H/O hiatal hernia    Heart murmur    ASD repair 1999- no meds    HIATAL HERNIA 07/09/2008   Qualifier: Diagnosis of  By: Kowalk CMA (AAMA), Leisha     Hx of dysplastic nevus    multiple sites, some severe   Hyperlipidemia    IBS 06/21/2009   Qualifier: Diagnosis of  By: Jakie MD NOLIA Alm SAUNDERS    MENOPAUSAL SYNDROME 01/15/2010   Qualifier: Diagnosis of  By: Jenetta MD, Talia      Muscle tightness 09/19/2021   Nausea vomiting and diarrhea 07/03/2022   Obesity    Plant allergic contact dermatitis 10/13/2018   PONV (postoperative nausea and vomiting)    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   Annotation: REPAIRED AS CHILD Qualifier: Diagnosis of  By: Bartley MD, Lamar Mulch    Viral URI with cough 11/30/2012   Past Surgical History:  Procedure Laterality Date   ASD REPAIR  1999   [facility]   BREAST CYST ASPIRATION Right 2013   Carpal tunnel repair Right 11/27/2022   CATARACT EXTRACTION Bilateral    Right done September 2023 and Left January 08 2022   CATARACT EXTRACTION Bilateral 12/21/2021   CLEFT LIP REPAIR     COLONOSCOPY     COLONOSCOPY WITH PROPOFOL  N/A 12/31/2022   Procedure: COLONOSCOPY WITH PROPOFOL ;  Surgeon: Hannah Gwendlyn ONEIDA, MD;  Location: [facility];  Service: Gastroenterology;  Laterality: N/A;   LAPAROSCOPIC ASSISTED VAGINAL HYSTERECTOMY Bilateral 04/13/2013   Procedure: LAPAROSCOPIC ASSISTED VAGINAL HYSTERECTOMY;  Surgeon: Harland JAYSON Birkenhead, MD;  Location: [facility];  Service: Gynecology;  Laterality: Bilateral;- pt states was abdominal hysterectomy    LAPAROSCOPIC TUBAL LIGATION  03/21/2012   Procedure: LAPAROSCOPIC TUBAL LIGATION;  Surgeon: Harland JAYSON Birkenhead, MD;  Location: [facility];  Service: Gynecology;  Laterality: Bilateral;   MANDIBLE FRACTURE SURGERY  MINOR CARPAL TUNNEL  2025   Plantar Fascitis Repair Bilateral 01/2015    reports that she quit smoking about 37 years ago. Her smoking use included cigarettes. She has never used smokeless tobacco. She reports current alcohol use. She reports that she does not use drugs. family history includes Cancer in her father; Colon polyps in her mother; Diabetes in her maternal aunt, mother, and paternal grandmother; Fibromyalgia in her sister; Heart disease in her father and mother; Prostate cancer in her father. Allergies[1]    Outpatient Encounter Medications as of 03/03/2024   Medication Sig   acetaminophen  (TYLENOL ) 500 MG tablet Take 500 mg by mouth every 6 (six) hours as needed for mild pain.    B Complex Vitamins (VITAMIN B COMPLEX PO) Take 1 tablet by mouth daily.   busPIRone  (BUSPAR ) 30 MG tablet TAKE 1 TABLET BY MOUTH EVERY MORNING 1/2 TAB AT LUNCH, AND 1/2 TAB AT BEDTIME FOR ANXIETY.   escitalopram  (LEXAPRO ) 10 MG tablet TAKE 1 TABLET (10 MG TOTAL) BY MOUTH DAILY. FOR ANXIETY.   flintstones complete (FLINTSTONES) 60 MG chewable tablet Chew 1 tablet by mouth daily.   fluticasone  (FLONASE ) 50 MCG/ACT nasal spray Place 2 sprays into both nostrils daily.   glipiZIDE  (GLUCOTROL  XL) 5 MG 24 hr tablet Take 1 tablet (5 mg total) by mouth daily with breakfast. for diabetes.   ibuprofen  (ADVIL ) 800 MG tablet TAKE 1 TABLET BY MOUTH EVERY 8 HOURS AS NEEDED   ketoconazole  (NIZORAL ) 2 % cream APPLY TOPICALLY AS DIRECTED ONCE-TWICE A DAY TO RASH UNDER BREAST TIL CLEAR, THEN AS NEED FOR FLARES   losartan -hydrochlorothiazide  (HYZAAR) 100-12.5 MG tablet Take 1 tablet by mouth daily.   metFORMIN  (GLUCOPHAGE -XR) 500 MG 24 hr tablet TAKE 2 TABLETS (1,000 MG TOTAL) BY MOUTH DAILY WITH BREAKFAST. FOR DIABETES.   oxyCODONE  (OXY IR/ROXICODONE ) 5 MG immediate release tablet Take by mouth.   pantoprazole  (PROTONIX ) 40 MG tablet Take 1 tablet (40 mg total) by mouth daily.   rosuvastatin  (CRESTOR ) 20 MG tablet TAKE 1 TABLET BY MOUTH EVERY DAY   terbinafine  (LAMISIL ) 250 MG tablet TAKE 1 TABLET BY MOUTH EVERY DAY   [DISCONTINUED] famotidine  (PEPCID ) 20 MG tablet TAKE 1 TABLET (20 MG) BY MOUTH AT BEDTIME AS NEEDED FOR HEARTBURN OR INDIGESTION. FOR HEARTBURN. (Patient not taking: Reported on 01/19/2024)   [DISCONTINUED] ibuprofen  (ADVIL ) 800 MG tablet Take 1 tablet (800 mg total) by mouth every 8 (eight) hours as needed. (Patient not taking: Reported on 01/19/2024)   [DISCONTINUED] mupirocin  ointment (BACTROBAN ) 2 % Apply to affected area toe twice daily until improved. (Patient not taking:  Reported on 01/19/2024)   [DISCONTINUED] terbinafine  (LAMISIL ) 250 MG tablet Take 1 tablet (250 mg total) by mouth daily.   No facility-administered encounter medications on file as of 03/03/2024.     REVIEW OF SYSTEMS  : All other systems reviewed and negative except where noted in the History of Present Illness.   PHYSICAL EXAM: BP (!) 146/72   Pulse 82   Ht 5' 4 (1.626 m)   Wt 182 lb 6 oz (82.7 kg)   LMP 03/24/2013   BMI 31.30 kg/m  General: Well developed white female in no acute distress Head: Normocephalic and atraumatic Eyes:  Sclerae anicteric, conjunctiva pink. Ears: Normal auditory acuity Lungs: Clear throughout to auscultation; no W/R/R. Heart: Regular rate and rhythm; no M/R/G. Musculoskeletal: Symmetrical with no gross deformities  Skin: No lesions on visible extremities Neurological: Alert oriented x 4, grossly non-focal Psychological:  Alert and cooperative.  Normal mood and affect  Assessment & Plan Gastroesophageal reflux disease Well-controlled with Protonix  40 mg daily. No symptoms reported. - Refilled Protonix  for 90 days [facility]. - Scheduled follow-up for Protonix  refill in 18-24 months if symptoms remain controlled. - Care being assigned to Dr. Legrand.  Colonoscopy recall entered for 12/2032 at the hospital due to difficult intubation.   CC:  Gretta Comer POUR, NP       [1]  Allergies Allergen Reactions   Sertraline Hcl     REACTION: vomiting, severe anxiety

## 2024-03-03 NOTE — Patient Instructions (Signed)
We have sent the following medications to your pharmacy for you to pick up at your convenience:  Pantoprazole  

## 2024-03-05 ENCOUNTER — Other Ambulatory Visit: Payer: Self-pay | Admitting: Cardiovascular Disease

## 2024-03-05 ENCOUNTER — Other Ambulatory Visit: Payer: Self-pay | Admitting: Primary Care

## 2024-03-05 DIAGNOSIS — F411 Generalized anxiety disorder: Secondary | ICD-10-CM

## 2024-03-05 DIAGNOSIS — E785 Hyperlipidemia, unspecified: Secondary | ICD-10-CM

## 2024-03-07 ENCOUNTER — Ambulatory Visit: Admitting: Primary Care

## 2024-03-08 NOTE — Progress Notes (Signed)
 ____________________________________________________________  Attending physician addendum:  Thank you for sending this case to me. I have reviewed the entire note and agree with the plan.   Victory Brand, MD  ____________________________________________________________

## 2024-03-10 DIAGNOSIS — G5602 Carpal tunnel syndrome, left upper limb: Secondary | ICD-10-CM | POA: Diagnosis not present

## 2024-03-14 ENCOUNTER — Encounter: Payer: Self-pay | Admitting: Primary Care

## 2024-03-14 ENCOUNTER — Ambulatory Visit: Admitting: Primary Care

## 2024-03-14 ENCOUNTER — Ambulatory Visit: Payer: Self-pay | Admitting: Primary Care

## 2024-03-14 VITALS — BP 134/72 | HR 67 | Temp 97.9°F | Ht 64.0 in | Wt 184.4 lb

## 2024-03-14 DIAGNOSIS — E119 Type 2 diabetes mellitus without complications: Secondary | ICD-10-CM

## 2024-03-14 DIAGNOSIS — Z7984 Long term (current) use of oral hypoglycemic drugs: Secondary | ICD-10-CM

## 2024-03-14 DIAGNOSIS — E1165 Type 2 diabetes mellitus with hyperglycemia: Secondary | ICD-10-CM

## 2024-03-14 LAB — POCT GLYCOSYLATED HEMOGLOBIN (HGB A1C): Hemoglobin A1C: 7.9 % — AB (ref 4.0–5.6)

## 2024-03-14 MED ORDER — METFORMIN HCL ER 500 MG PO TB24: 1000.0000 mg | ORAL_TABLET | Freq: Two times a day (BID) | ORAL | 1 refills | Status: AC

## 2024-03-14 NOTE — Assessment & Plan Note (Signed)
 Deteriorated with A1C of 7.9 today.  We discussed options, continue working on diet. Increase metformin  ER to 1000 mg BID. Remain off glipizide  due to side effects of anxiety.  Foot exam today.  Follow up in 3 months.

## 2024-03-14 NOTE — Patient Instructions (Signed)
 We increased your dose of metformin  to 2 pills twice daily for diabetes.  Please schedule a physical to meet with me in 3 months.   It was a pleasure to see you today!

## 2024-03-14 NOTE — Progress Notes (Signed)
 "  Subjective:    Patient ID: Hannah Rocha, female    DOB: Aug 20, 1965, 58 y.o.   MRN: [id number]  Hannah Rocha is a very pleasant 58 y.o. female with a history of type 2 diabetes, OSA, hypertension, hyperlipidemia who presents today for follow-up diabetes.  1) Type 2 Diabetes:  Current medications include: Metformin  ER 1000 mg daily, glipizide  XL 5 mg daily.  She discontinued glipizide  about 1 month ago as it caused increased anxiety.   She is checking her blood glucose 1 times daily and is getting readings of 127-159  Last A1C: 7.5 in September 2025, Last Eye Exam: Due Last Foot Exam: Due Pneumonia Vaccination: 2019 Urine Microalbumin: Up-to-date Statin: Rosuvastatin   Dietary changes since last visit: Cutting back on fried foods. No soda in months. Mostly drinking water, unsweet tea, and coffee.    Exercise: Walking  BP Readings from Last 3 Encounters:  03/14/24 134/72  03/03/24 (!) 146/72  01/19/24 120/70   Wt Readings from Last 3 Encounters:  03/14/24 184 lb 6 oz (83.6 kg)  03/03/24 182 lb 6 oz (82.7 kg)  01/19/24 187 lb 9.6 oz (85.1 kg)       Review of Systems  Respiratory:  Negative for shortness of breath.   Cardiovascular:  Negative for chest pain.  Gastrointestinal:  Negative for abdominal pain and diarrhea.  Neurological:  Negative for numbness.         Past Medical History:  Diagnosis Date   Allergy    Anemia    hx   Anxiety    CARDIAC MURMUR, HX OF 05/03/2007   Annotation: ASD Qualifier: Diagnosis of  By: Bartley MD, Lamar Mulch    Cataract    FLOATING CATARACT    COVID-19 02/07/2021   De Quervain's tenosynovitis, right 03/29/2019   Diabetes mellitus without complication (HCC)    Dysplastic nevus 01/14/2015   right spinal mid back, severe, excised 03/04/2015   Dysplastic nevus 02/08/2018   right upper back/post shoulder, moderate   Dysplastic nevus 02/08/2018   left spinal mid upper back, severe, excised 03/21/2018   Fluttering  sensation of heart 06/25/2021   Generalized abdominal pain 09/19/2021   GERD (gastroesophageal reflux disease)    H/O hiatal hernia    Heart murmur    ASD repair 1999- no meds    HIATAL HERNIA 07/09/2008   Qualifier: Diagnosis of  By: Kowalk CMA (AAMA), Leisha     Hx of dysplastic nevus    multiple sites, some severe   Hyperlipidemia    IBS 06/21/2009   Qualifier: Diagnosis of  By: Jakie MD NOLIA Alm SAUNDERS    MENOPAUSAL SYNDROME 01/15/2010   Qualifier: Diagnosis of  By: Jenetta MD, Talia     Muscle tightness 09/19/2021   Nausea vomiting and diarrhea 07/03/2022   Obesity    Plant allergic contact dermatitis 10/13/2018   PONV (postoperative nausea and vomiting)    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   Annotation: REPAIRED AS CHILD Qualifier: Diagnosis of  By: Bartley MD, Lamar Mulch    Viral URI with cough 11/30/2012    Social History   Socioeconomic History   Marital status: Married    Spouse name: Not on file   Number of children: 0   Years of education: Not on file   Highest education level: 12th grade  Occupational History   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    Employer: [employer]  Tobacco Use   Smoking status: Former    Current  packs/day: 0.00    Types: Cigarettes    Quit date: 02/21/1987    Years since quitting: 37.0   Smokeless tobacco: Never   Tobacco comments:    22 years ago-light smoker  Vaping Use   Vaping status: Never Used  Substance and Sexual Activity   Alcohol use: Yes    Alcohol/week: 0.0 standard drinks of alcohol    Comment: occasional - hard apple cider, shot   Drug use: No   Sexual activity: Not Currently    Partners: Male    Birth control/protection: Surgical    Comment: tubaligation/hysterctomy  Other Topics Concern   Not on file  Social History Narrative   Daily caffeine use   Social Drivers of Health   Tobacco Use: Medium Risk (03/14/2024)   Patient History    Smoking Tobacco Use: Former     Smokeless Tobacco Use: Never    Passive Exposure: Not on file  Financial Resource Strain: Patient Declined (12/01/2023)   Overall Financial Resource Strain (CARDIA)    Difficulty of Paying Living Expenses: Patient declined  Food Insecurity: Patient Declined (12/01/2023)   Epic    Worried About Programme Researcher, Broadcasting/film/video in the Last Year: Patient declined    Barista in the Last Year: Patient declined  Transportation Needs: Patient Declined (12/01/2023)   Epic    Lack of Transportation (Medical): Patient declined    Lack of Transportation (Non-Medical): Patient declined  Physical Activity: Insufficiently Active (12/01/2023)   Exercise Vital Sign    Days of Exercise per Week: 5 days    Minutes of Exercise per Session: 20 min  Stress: No Stress Concern Present (12/01/2023)   [employer] of Occupational Health - Occupational Stress Questionnaire    Feeling of Stress: Not at all  Social Connections: Unknown (12/01/2023)   Social Connection and Isolation Panel    Frequency of Communication with Friends and Family: More than three times a week    Frequency of Social Gatherings with Friends and Family: Once a week    Attends Religious Services: Patient declined    Database Administrator or Organizations: Patient declined    Attends Banker Meetings: Not on file    Marital Status: Married  Intimate Partner Violence: Not on file  Depression (PHQ2-9): Low Risk (03/14/2024)   Depression (PHQ2-9)    PHQ-2 Score: 0  Alcohol Screen: Not on file  Housing: Patient Declined (12/01/2023)   Epic    Unable to Pay for Housing in the Last Year: Patient declined    Number of Times Moved in the Last Year: Not on file    Homeless in the Last Year: Patient declined  Utilities: Not on file  Health Literacy: Not on file    Past Surgical History:  Procedure Laterality Date   ASD REPAIR  1999   [facility]   BREAST CYST ASPIRATION Right 2013   Carpal tunnel repair Right 11/27/2022   CATARACT  EXTRACTION Bilateral    Right done September 2023 and Left January 08 2022   CATARACT EXTRACTION Bilateral 12/21/2021   CLEFT LIP REPAIR     COLONOSCOPY     COLONOSCOPY WITH PROPOFOL  N/A 12/31/2022   Procedure: COLONOSCOPY WITH PROPOFOL ;  Surgeon: Aneita Gwendlyn DASEN, MD;  Location: [facility];  Service: Gastroenterology;  Laterality: N/A;   LAPAROSCOPIC ASSISTED VAGINAL HYSTERECTOMY Bilateral 04/13/2013   Procedure: LAPAROSCOPIC ASSISTED VAGINAL HYSTERECTOMY;  Surgeon: Harland JAYSON Birkenhead, MD;  Location: [facility];  Service: Gynecology;  Laterality: Bilateral;- pt states  was abdominal hysterectomy    LAPAROSCOPIC TUBAL LIGATION  03/21/2012   Procedure: LAPAROSCOPIC TUBAL LIGATION;  Surgeon: Harland JAYSON Birkenhead, MD;  Location: [facility];  Service: Gynecology;  Laterality: Bilateral;   MANDIBLE FRACTURE SURGERY     MINOR CARPAL TUNNEL  2025   Plantar Fascitis Repair Bilateral 01/2015    Family History  Problem Relation Age of Onset   Heart disease Mother    Colon polyps Mother    Diabetes Mother    Prostate cancer Father    Heart disease Father    Cancer Father        prostate   Diabetes Maternal Aunt    Diabetes Paternal Grandmother    Fibromyalgia Sister    Colon cancer Neg Hx    Breast cancer Neg Hx    Esophageal cancer Neg Hx    Rectal cancer Neg Hx    Stomach cancer Neg Hx     Allergies[1]  Medications Ordered Prior to Encounter[2]  BP 134/72   Pulse 67   Temp 97.9 F (36.6 C) (Oral)   Ht 5' 4 (1.626 m)   Wt 184 lb 6 oz (83.6 kg)   LMP 03/24/2013   SpO2 96%   BMI 31.65 kg/m  Objective:   Physical Exam Cardiovascular:     Rate and Rhythm: Normal rate and regular rhythm.  Pulmonary:     Effort: Pulmonary effort is normal.     Breath sounds: Normal breath sounds.  Musculoskeletal:     Cervical back: Neck supple.  Skin:    General: Skin is warm and dry.  Neurological:     Mental Status: She is alert and oriented to person, place, and time.  Psychiatric:        Mood and  Affect: Mood normal.     Physical Exam        Assessment & Plan:  Type 2 diabetes mellitus with hyperglycemia, without long-term current use of insulin (HCC) Assessment & Plan: Deteriorated with A1C of 7.9 today.  We discussed options, continue working on diet. Increase metformin  ER to 1000 mg BID. Remain off glipizide  due to side effects of anxiety.  Foot exam today.  Follow up in 3 months.  Orders: -     POCT glycosylated hemoglobin (Hb A1C)  Type 2 diabetes mellitus without complication, without long-term current use of insulin (HCC) -     metFORMIN  HCl ER; Take 2 tablets (1,000 mg total) by mouth 2 (two) times daily with a meal. For diabetes.  Dispense: 360 tablet; Refill: 1    Assessment and Plan Assessment & Plan         Comer MARLA Gaskins, NP       [1]  Allergies Allergen Reactions   Sertraline Hcl     REACTION: vomiting, severe anxiety  [2]  Current Outpatient Medications on File Prior to Visit  Medication Sig Dispense Refill   acetaminophen  (TYLENOL ) 500 MG tablet Take 500 mg by mouth every 6 (six) hours as needed for mild pain.      B Complex Vitamins (VITAMIN B COMPLEX PO) Take 1 tablet by mouth daily.     busPIRone  (BUSPAR ) 30 MG tablet TAKE 1 TABLET BY MOUTH EVERY MORNING 1/2 TAB AT LUNCH, AND 1/2 TAB AT BEDTIME FOR ANXIETY. 180 tablet 0   escitalopram  (LEXAPRO ) 10 MG tablet TAKE 1 TABLET (10 MG TOTAL) BY MOUTH DAILY. FOR ANXIETY. 90 tablet 2   flintstones complete (FLINTSTONES) 60 MG chewable tablet Chew 1 tablet by mouth daily.  fluticasone  (FLONASE ) 50 MCG/ACT nasal spray Place 2 sprays into both nostrils daily. 16 g 6   ibuprofen  (ADVIL ) 800 MG tablet TAKE 1 TABLET BY MOUTH EVERY 8 HOURS AS NEEDED 60 tablet 3   ketoconazole  (NIZORAL ) 2 % cream APPLY TOPICALLY AS DIRECTED ONCE-TWICE A DAY TO RASH UNDER BREAST TIL CLEAR, THEN AS NEED FOR FLARES     losartan -hydrochlorothiazide  (HYZAAR) 100-12.5 MG tablet Take 1 tablet by mouth daily. 30  tablet 1   pantoprazole  (PROTONIX ) 40 MG tablet Take 1 tablet (40 mg total) by mouth daily. 90 tablet 3   rosuvastatin  (CRESTOR ) 20 MG tablet TAKE 1 TABLET BY MOUTH EVERY DAY 30 tablet 1   terbinafine  (LAMISIL ) 250 MG tablet TAKE 1 TABLET BY MOUTH EVERY DAY 30 tablet 0   No current facility-administered medications on file prior to visit.   "

## 2024-03-22 ENCOUNTER — Ambulatory Visit: Payer: Self-pay | Admitting: Primary Care

## 2024-03-22 ENCOUNTER — Encounter: Payer: Self-pay | Admitting: Primary Care

## 2024-03-22 ENCOUNTER — Ambulatory Visit: Admitting: Primary Care

## 2024-03-22 VITALS — BP 136/70 | HR 86 | Temp 99.7°F | Ht 64.0 in | Wt 182.1 lb

## 2024-03-22 DIAGNOSIS — G4733 Obstructive sleep apnea (adult) (pediatric): Secondary | ICD-10-CM | POA: Diagnosis not present

## 2024-03-22 DIAGNOSIS — R051 Acute cough: Secondary | ICD-10-CM

## 2024-03-22 LAB — POC COVID19 BINAXNOW: SARS Coronavirus 2 Ag: NEGATIVE

## 2024-03-22 LAB — POCT INFLUENZA A/B
Influenza A, POC: NEGATIVE
Influenza B, POC: NEGATIVE

## 2024-03-22 MED ORDER — HYDROCOD POLI-CHLORPHE POLI ER 10-8 MG/5ML PO SUER: 5.0000 mL | Freq: Two times a day (BID) | ORAL | 0 refills | Status: AC | PRN

## 2024-03-22 NOTE — Patient Instructions (Signed)
 Start Tussionex cough suppressant. Take 5 ml every 12 hours as needed for cough and rest. Caution this medication contains codeine which may cause drowsiness.   Cough: Delsym or Robitussin (get the off brand, works just as well) Chest Congestion: Mucinex  (plain) Nasal Congestion/Ear Pressure/Sinus Pressure: Try using Flonase  (fluticasone ) nasal spray. Instill 1 spray in each nostril twice daily. This can be purchased over the counter. Body aches, fevers, headache: Ibuprofen  (not to exceed 2400 mg in 24 hours) or Acetaminophen -Tylenol  (not to exceed 3000 mg in 24 hours) Runny Nose/Throat Drainage/Sneezing/Itchy or Watery Eyes: An antihistamine such as Zyrtec, Claritin, Xyzal, Allegra  Please call me Monday if her symptoms have not improved  It was a pleasure to see you today!

## 2024-03-22 NOTE — Progress Notes (Signed)
 "  Subjective:    Patient ID: Hannah Rocha, female    DOB: Aug 21, 1965, 58 y.o.   MRN: [id number]  Hannah Rocha is a very pleasant 58 y.o. female with a history of hypertension, OSA, type 2 diabetes, eustachian tube dysfunction who presents today to discuss cough.  Symptom onset 4 days ago with a dry cough with chest tightness. She then developed nasal drainage.   She denies ear pain, fevers, chills, body aches. She's been taking Tylenol  and Delsym cough suppressant with temporary improvement except during the night. She's not getting much sleep due to her cough.   She has been around her sister who has had similar symptoms.  She is requesting medication today to help her sleep due to cough.   Review of Systems  Constitutional:  Negative for chills, fatigue and fever.  HENT:  Positive for congestion and rhinorrhea.   Respiratory:  Positive for cough.   Neurological:  Negative for headaches.         Past Medical History:  Diagnosis Date   Allergy    Anemia    hx   Anxiety    CARDIAC MURMUR, HX OF 05/03/2007   Annotation: ASD Qualifier: Diagnosis of  By: Bartley MD, Lamar Mulch    Cataract    FLOATING CATARACT    COVID-19 02/07/2021   De Quervain's tenosynovitis, right 03/29/2019   Diabetes mellitus without complication (HCC)    Dysplastic nevus 01/14/2015   right spinal mid back, severe, excised 03/04/2015   Dysplastic nevus 02/08/2018   right upper back/post shoulder, moderate   Dysplastic nevus 02/08/2018   left spinal mid upper back, severe, excised 03/21/2018   Fluttering sensation of heart 06/25/2021   Generalized abdominal pain 09/19/2021   GERD (gastroesophageal reflux disease)    H/O hiatal hernia    Heart murmur    ASD repair 1999- no meds    HIATAL HERNIA 07/09/2008   Qualifier: Diagnosis of  By: Kowalk CMA (AAMA), Leisha     Hx of dysplastic nevus    multiple sites, some severe   Hyperlipidemia    IBS 06/21/2009   Qualifier: Diagnosis of  By:  Jakie MD NOLIA Alm SAUNDERS    MENOPAUSAL SYNDROME 01/15/2010   Qualifier: Diagnosis of  By: Jenetta MD, Talia     Muscle tightness 09/19/2021   Nausea vomiting and diarrhea 07/03/2022   Obesity    Plant allergic contact dermatitis 10/13/2018   PONV (postoperative nausea and vomiting)    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   Annotation: REPAIRED AS CHILD Qualifier: Diagnosis of  By: Bartley MD, Lamar Mulch    Viral URI with cough 11/30/2012    Social History   Socioeconomic History   Marital status: Married    Spouse name: Not on file   Number of children: 0   Years of education: Not on file   Highest education level: 12th grade  Occupational History   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    Employer: [employer]  Tobacco Use   Smoking status: Former    Current packs/day: 0.00    Types: Cigarettes    Quit date: 02/21/1987    Years since quitting: 37.1   Smokeless tobacco: Never   Tobacco comments:    22 years ago-light smoker  Vaping Use   Vaping status: Never Used  Substance and Sexual Activity   Alcohol use: Yes    Alcohol/week: 0.0 standard drinks of alcohol    Comment: occasional - hard apple  cider, shot   Drug use: No   Sexual activity: Not Currently    Partners: Male    Birth control/protection: Surgical    Comment: tubaligation/hysterctomy  Other Topics Concern   Not on file  Social History Narrative   Daily caffeine use   Social Drivers of Health   Tobacco Use: Medium Risk (03/22/2024)   Patient History    Smoking Tobacco Use: Former    Smokeless Tobacco Use: Never    Passive Exposure: Not on file  Financial Resource Strain: Patient Declined (12/01/2023)   Overall Financial Resource Strain (CARDIA)    Difficulty of Paying Living Expenses: Patient declined  Food Insecurity: Patient Declined (12/01/2023)   Epic    Worried About Programme Researcher, Broadcasting/film/video in the Last Year: Patient declined    Barista in the Last Year: Patient  declined  Transportation Needs: Patient Declined (12/01/2023)   Epic    Lack of Transportation (Medical): Patient declined    Lack of Transportation (Non-Medical): Patient declined  Physical Activity: Insufficiently Active (12/01/2023)   Exercise Vital Sign    Days of Exercise per Week: 5 days    Minutes of Exercise per Session: 20 min  Stress: No Stress Concern Present (12/01/2023)   [employer] of Occupational Health - Occupational Stress Questionnaire    Feeling of Stress: Not at all  Social Connections: Unknown (12/01/2023)   Social Connection and Isolation Panel    Frequency of Communication with Friends and Family: More than three times a week    Frequency of Social Gatherings with Friends and Family: Once a week    Attends Religious Services: Patient declined    Database Administrator or Organizations: Patient declined    Attends Banker Meetings: Not on file    Marital Status: Married  Intimate Partner Violence: Not on file  Depression (PHQ2-9): Low Risk (03/22/2024)   Depression (PHQ2-9)    PHQ-2 Score: 0  Alcohol Screen: Not on file  Housing: Patient Declined (12/01/2023)   Epic    Unable to Pay for Housing in the Last Year: Patient declined    Number of Times Moved in the Last Year: Not on file    Homeless in the Last Year: Patient declined  Utilities: Not on file  Health Literacy: Not on file    Past Surgical History:  Procedure Laterality Date   ASD REPAIR  1999   [facility]   BREAST CYST ASPIRATION Right 2013   Carpal tunnel repair Right 11/27/2022   CATARACT EXTRACTION Bilateral    Right done September 2023 and Left January 08 2022   CATARACT EXTRACTION Bilateral 12/21/2021   CLEFT LIP REPAIR     COLONOSCOPY     COLONOSCOPY WITH PROPOFOL  N/A 12/31/2022   Procedure: COLONOSCOPY WITH PROPOFOL ;  Surgeon: Aneita Gwendlyn DASEN, MD;  Location: [facility];  Service: Gastroenterology;  Laterality: N/A;   LAPAROSCOPIC ASSISTED VAGINAL HYSTERECTOMY Bilateral  04/13/2013   Procedure: LAPAROSCOPIC ASSISTED VAGINAL HYSTERECTOMY;  Surgeon: Harland JAYSON Birkenhead, MD;  Location: [facility];  Service: Gynecology;  Laterality: Bilateral;- pt states was abdominal hysterectomy    LAPAROSCOPIC TUBAL LIGATION  03/21/2012   Procedure: LAPAROSCOPIC TUBAL LIGATION;  Surgeon: Harland JAYSON Birkenhead, MD;  Location: [facility];  Service: Gynecology;  Laterality: Bilateral;   MANDIBLE FRACTURE SURGERY     MINOR CARPAL TUNNEL  2025   Plantar Fascitis Repair Bilateral 01/2015    Family History  Problem Relation Age of Onset   Heart disease Mother  Colon polyps Mother    Diabetes Mother    Prostate cancer Father    Heart disease Father    Cancer Father        prostate   Diabetes Maternal Aunt    Diabetes Paternal Grandmother    Fibromyalgia Sister    Colon cancer Neg Hx    Breast cancer Neg Hx    Esophageal cancer Neg Hx    Rectal cancer Neg Hx    Stomach cancer Neg Hx     Allergies[1]  Medications Ordered Prior to Encounter[2]  BP 136/70   Pulse 86   Temp 99.7 F (37.6 C) (Oral)   Ht 5' 4 (1.626 m)   Wt 182 lb 2 oz (82.6 kg)   LMP 03/24/2013   SpO2 97%   BMI 31.26 kg/m  Objective:   Physical Exam Constitutional:      Appearance: She is not ill-appearing.  HENT:     Right Ear: Tympanic membrane and ear canal normal.     Left Ear: Tympanic membrane and ear canal normal.     Nose: No mucosal edema.     Right Sinus: No maxillary sinus tenderness or frontal sinus tenderness.     Left Sinus: No maxillary sinus tenderness or frontal sinus tenderness.  Eyes:     Conjunctiva/sclera: Conjunctivae normal.  Cardiovascular:     Rate and Rhythm: Normal rate and regular rhythm.  Pulmonary:     Effort: Pulmonary effort is normal.     Breath sounds: Examination of the right-upper field reveals rhonchi. Examination of the right-lower field reveals rhonchi. Rhonchi present. No wheezing.  Musculoskeletal:     Cervical back: Neck supple.  Skin:    General: Skin is warm and  dry.     Physical Exam        Assessment & Plan:  Acute cough Assessment & Plan: HPI and presentation today representative of viral etiology. Respiratory exam today overall reassuring.  Negative rapid influenza and COVID-19 test today  We discussed conservative treatment including OTC products. Start Tussionex cough suppressant. Take 5 ml every 12 hours as needed for cough and rest. Caution this medication contains codeine which may cause drowsiness.   Fluids, rest  Return precautions provided  Orders: -     POC COVID-19 BinaxNow -     POCT Influenza A/B -     Hydrocod Poli-Chlorphe Poli ER; Take 5 mLs by mouth every 12 (twelve) hours as needed for cough.  Dispense: 50 mL; Refill: 0    Assessment and Plan Assessment & Plan         Comer MARLA Gaskins, NP       [1]  Allergies Allergen Reactions   Sertraline Hcl     REACTION: vomiting, severe anxiety  [2]  Current Outpatient Medications on File Prior to Visit  Medication Sig Dispense Refill   acetaminophen  (TYLENOL ) 500 MG tablet Take 500 mg by mouth every 6 (six) hours as needed for mild pain.      B Complex Vitamins (VITAMIN B COMPLEX PO) Take 1 tablet by mouth daily.     busPIRone  (BUSPAR ) 30 MG tablet TAKE 1 TABLET BY MOUTH EVERY MORNING 1/2 TAB AT LUNCH, AND 1/2 TAB AT BEDTIME FOR ANXIETY. 180 tablet 0   escitalopram  (LEXAPRO ) 10 MG tablet TAKE 1 TABLET (10 MG TOTAL) BY MOUTH DAILY. FOR ANXIETY. 90 tablet 2   flintstones complete (FLINTSTONES) 60 MG chewable tablet Chew 1 tablet by mouth daily.     fluticasone  (FLONASE ) 50  MCG/ACT nasal spray Place 2 sprays into both nostrils daily. 16 g 6   ibuprofen  (ADVIL ) 800 MG tablet TAKE 1 TABLET BY MOUTH EVERY 8 HOURS AS NEEDED 60 tablet 3   ketoconazole  (NIZORAL ) 2 % cream APPLY TOPICALLY AS DIRECTED ONCE-TWICE A DAY TO RASH UNDER BREAST TIL CLEAR, THEN AS NEED FOR FLARES     losartan -hydrochlorothiazide  (HYZAAR) 100-12.5 MG tablet Take 1 tablet by mouth  daily. 30 tablet 1   metFORMIN  (GLUCOPHAGE -XR) 500 MG 24 hr tablet Take 2 tablets (1,000 mg total) by mouth 2 (two) times daily with a meal. For diabetes. 360 tablet 1   pantoprazole  (PROTONIX ) 40 MG tablet Take 1 tablet (40 mg total) by mouth daily. 90 tablet 3   rosuvastatin  (CRESTOR ) 20 MG tablet TAKE 1 TABLET BY MOUTH EVERY DAY 30 tablet 1   terbinafine  (LAMISIL ) 250 MG tablet TAKE 1 TABLET BY MOUTH EVERY DAY 30 tablet 0   No current facility-administered medications on file prior to visit.   "

## 2024-03-22 NOTE — Assessment & Plan Note (Addendum)
 HPI and presentation today representative of viral etiology. Respiratory exam today overall reassuring.  Negative rapid influenza and COVID-19 test today  We discussed conservative treatment including OTC products. Start Tussionex cough suppressant. Take 5 ml every 12 hours as needed for cough and rest. Caution this medication contains codeine which may cause drowsiness.   Fluids, rest  Return precautions provided

## 2024-03-27 DIAGNOSIS — R051 Acute cough: Secondary | ICD-10-CM

## 2024-03-27 MED ORDER — ALBUTEROL SULFATE HFA 108 (90 BASE) MCG/ACT IN AERS: 2.0000 | INHALATION_SPRAY | Freq: Four times a day (QID) | RESPIRATORY_TRACT | 0 refills | Status: AC | PRN

## 2024-04-03 NOTE — Progress Notes (Signed)
 CARDIOLOGY CONSULT NOTE       Patient ID: Hannah Rocha MRN: [id number] DOB/AGE: [birth year] 60 y.o.  Primary Physician: Gretta Comer POUR, NP Primary Cardiologist: Delford  HPI:  59 y.o. referred by NP Gretta for history of ASD repair and palpitations. First seen by me 06/27/21 She appears to have been followed in past by Dr Bartley Surgery was in ? 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   She feels well Using CPAP which gives her a lot more energy Working on low carb diet since christmas     ROS All other systems reviewed and negative except as noted above  Past Medical History:  Diagnosis Date   Allergy    Anemia    hx   Anxiety    CARDIAC MURMUR, HX OF 05/03/2007   Annotation: ASD Qualifier: Diagnosis of  By: Bartley MD, Lamar Mulch    Cataract    FLOATING CATARACT    COVID-19 02/07/2021   De Quervain's tenosynovitis, right 03/29/2019   Diabetes mellitus without complication (HCC)    Dysplastic nevus 01/14/2015   right spinal mid back, severe, excised 03/04/2015   Dysplastic nevus 02/08/2018   right upper back/post shoulder, moderate   Dysplastic nevus 02/08/2018   left spinal mid upper back, severe, excised 03/21/2018   Fluttering sensation of heart 06/25/2021   Generalized abdominal pain 09/19/2021   GERD (gastroesophageal reflux disease)     H/O hiatal hernia    Heart murmur    ASD repair 1999- no meds    HIATAL HERNIA 07/09/2008   Qualifier: Diagnosis of  By: Kowalk CMA (AAMA), Leisha     Hx of dysplastic nevus    multiple sites, some severe   Hyperlipidemia    IBS 06/21/2009   Qualifier: Diagnosis of  By: Jakie MD NOLIA Alm SAUNDERS    MENOPAUSAL SYNDROME 01/15/2010   Qualifier: Diagnosis of  By: Jenetta MD, Talia     Muscle tightness 09/19/2021   Nausea vomiting and diarrhea 07/03/2022   Obesity    Plant allergic contact dermatitis 10/13/2018   PONV (postoperative nausea and vomiting)    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   Annotation: REPAIRED AS CHILD Qualifier: Diagnosis of  By: Bartley MD, Lamar Mulch    Viral URI with cough 11/30/2012    Family History  Problem Relation Age of Onset   Heart disease Mother    Colon polyps Mother    Diabetes Mother    Prostate cancer Father    Heart disease Father    Cancer Father        prostate   Diabetes Maternal Aunt    Diabetes Paternal Grandmother    Fibromyalgia Sister    Colon cancer Neg Hx    Breast cancer Neg Hx  Esophageal cancer Neg Hx    Rectal cancer Neg Hx    Stomach cancer Neg Hx     Social History   Socioeconomic History   Marital status: Married    Spouse name: Not on file   Number of children: 0   Years of education: Not on file   Highest education level: 12th grade  Occupational History   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    Employer: [employer]  Tobacco Use   Smoking status: Former    Current packs/day: 0.00    Types: Cigarettes    Quit date: 02/21/1987    Years since quitting: 37.1   Smokeless tobacco: Never   Tobacco comments:    22 years ago-light smoker  Vaping Use   Vaping status: Never Used  Substance and Sexual Activity   Alcohol use: Yes    Alcohol/week: 0.0 standard drinks of alcohol    Comment: occasional - hard apple cider, shot   Drug use: No   Sexual activity: Not Currently     Partners: Male    Birth control/protection: Surgical    Comment: tubaligation/hysterctomy  Other Topics Concern   Not on file  Social History Narrative   Daily caffeine use   Social Drivers of Health   Tobacco Use: Medium Risk (04/14/2024)   Patient History    Smoking Tobacco Use: Former    Smokeless Tobacco Use: Never    Passive Exposure: Not on file  Financial Resource Strain: Patient Declined (12/01/2023)   Overall Financial Resource Strain (CARDIA)    Difficulty of Paying Living Expenses: Patient declined  Food Insecurity: Patient Declined (12/01/2023)   Epic    Worried About Programme Researcher, Broadcasting/film/video in the Last Year: Patient declined    Barista in the Last Year: Patient declined  Transportation Needs: Patient Declined (12/01/2023)   Epic    Lack of Transportation (Medical): Patient declined    Lack of Transportation (Non-Medical): Patient declined  Physical Activity: Insufficiently Active (12/01/2023)   Exercise Vital Sign    Days of Exercise per Week: 5 days    Minutes of Exercise per Session: 20 min  Stress: No Stress Concern Present (12/01/2023)   [employer] of Occupational Health - Occupational Stress Questionnaire    Feeling of Stress: Not at all  Social Connections: Unknown (12/01/2023)   Social Connection and Isolation Panel    Frequency of Communication with Friends and Family: More than three times a week    Frequency of Social Gatherings with Friends and Family: Once a week    Attends Religious Services: Patient declined    Database Administrator or Organizations: Patient declined    Attends Banker Meetings: Not on file    Marital Status: Married  Intimate Partner Violence: Not on file  Depression (PHQ2-9): Low Risk (03/22/2024)   Depression (PHQ2-9)    PHQ-2 Score: 0  Alcohol Screen: Not on file  Housing: Patient Declined (12/01/2023)   Epic    Unable to Pay for Housing in the Last Year: Patient declined    Number of Times Moved in the  Last Year: Not on file    Homeless in the Last Year: Patient declined  Utilities: Not on file  Health Literacy: Not on file    Past Surgical History:  Procedure Laterality Date   ASD REPAIR  1999   [facility]   BREAST CYST ASPIRATION Right 2013   Carpal tunnel repair Right 11/27/2022   CATARACT EXTRACTION Bilateral  Right done September 2023 and Left January 08 2022   CATARACT EXTRACTION Bilateral 12/21/2021   CLEFT LIP REPAIR     COLONOSCOPY     COLONOSCOPY WITH PROPOFOL  N/A 12/31/2022   Procedure: COLONOSCOPY WITH PROPOFOL ;  Surgeon: Aneita Gwendlyn DASEN, MD;  Location: [facility];  Service: Gastroenterology;  Laterality: N/A;   LAPAROSCOPIC ASSISTED VAGINAL HYSTERECTOMY Bilateral 04/13/2013   Procedure: LAPAROSCOPIC ASSISTED VAGINAL HYSTERECTOMY;  Surgeon: Harland JAYSON Birkenhead, MD;  Location: [facility];  Service: Gynecology;  Laterality: Bilateral;- pt states was abdominal hysterectomy    LAPAROSCOPIC TUBAL LIGATION  03/21/2012   Procedure: LAPAROSCOPIC TUBAL LIGATION;  Surgeon: Harland JAYSON Birkenhead, MD;  Location: [facility];  Service: Gynecology;  Laterality: Bilateral;   MANDIBLE FRACTURE SURGERY     MINOR CARPAL TUNNEL  2025   Plantar Fascitis Repair Bilateral 01/2015      Current Outpatient Medications:    acetaminophen  (TYLENOL ) 500 MG tablet, Take 500 mg by mouth every 6 (six) hours as needed for mild pain. , Disp: , Rfl:    B Complex Vitamins (VITAMIN B COMPLEX PO), Take 1 tablet by mouth daily., Disp: , Rfl:    busPIRone  (BUSPAR ) 30 MG tablet, TAKE 1 TABLET BY MOUTH EVERY MORNING 1/2 TAB AT LUNCH, AND 1/2 TAB AT BEDTIME FOR ANXIETY., Disp: 180 tablet, Rfl: 0   escitalopram  (LEXAPRO ) 10 MG tablet, TAKE 1 TABLET (10 MG TOTAL) BY MOUTH DAILY. FOR ANXIETY., Disp: 90 tablet, Rfl: 0   flintstones complete (FLINTSTONES) 60 MG chewable tablet, Chew 1 tablet by mouth daily., Disp: , Rfl:    fluticasone  (FLONASE ) 50 MCG/ACT nasal spray, Place 2 sprays into both nostrils daily., Disp: 16 g, Rfl: 6   ibuprofen   (ADVIL ) 800 MG tablet, TAKE 1 TABLET BY MOUTH EVERY 8 HOURS AS NEEDED, Disp: 60 tablet, Rfl: 3   ketoconazole  (NIZORAL ) 2 % cream, APPLY TOPICALLY AS DIRECTED ONCE-TWICE A DAY TO RASH UNDER BREAST TIL CLEAR, THEN AS NEED FOR FLARES, Disp: , Rfl:    losartan -hydrochlorothiazide  (HYZAAR) 100-12.5 MG tablet, Take 1 tablet by mouth daily., Disp: 30 tablet, Rfl: 1   metFORMIN  (GLUCOPHAGE -XR) 500 MG 24 hr tablet, Take 2 tablets (1,000 mg total) by mouth 2 (two) times daily with a meal. For diabetes., Disp: 360 tablet, Rfl: 1   pantoprazole  (PROTONIX ) 40 MG tablet, Take 1 tablet (40 mg total) by mouth daily., Disp: 90 tablet, Rfl: 3   rosuvastatin  (CRESTOR ) 20 MG tablet, TAKE 1 TABLET BY MOUTH EVERY DAY, Disp: 30 tablet, Rfl: 1   terbinafine  (LAMISIL ) 250 MG tablet, TAKE 1 TABLET BY MOUTH EVERY DAY, Disp: 30 tablet, Rfl: 0   albuterol  (VENTOLIN  HFA) 108 (90 Base) MCG/ACT inhaler, Inhale 2 puffs into the lungs every 6 (six) hours as needed for shortness of breath., Disp: 1 each, Rfl: 0   chlorpheniramine-HYDROcodone  (TUSSIONEX) 10-8 MG/5ML, Take 5 mLs by mouth every 12 (twelve) hours as needed for cough., Disp: 50 mL, Rfl: 0    Physical Exam: Blood pressure (!) 140/70, pulse 67, height 5' 4 (1.626 m), weight 79.4 kg, last menstrual period 03/24/2013, SpO2 98%.    Affect appropriate Healthy:  appears stated age HEENT: normal Neck supple with no adenopathy JVP normal no bruits no thyromegaly Lungs clear with no wheezing and good diaphragmatic motion Heart:  S1/S2 SEM  murmur, no rub, gallop or click PMI normal Previous sternotomy    Abdomen: benighn, BS positve, no tenderness, no AAA no bruit.  No HSM or HJR Distal pulses intact with no bruits  Varicose veins  Neuro non-focal Skin warm and dry No muscular weakness   Labs:   Lab Results  Component Value Date   WBC 5.1 07/03/2022   HGB 12.2 07/03/2022   HCT 35.6 07/03/2022   MCV 89.0 07/03/2022   PLT 221 07/03/2022   No results for  input(s): NA, K, CL, CO2, BUN, CREATININE, CALCIUM , PROT, BILITOT, ALKPHOS, ALT, AST, GLUCOSE in the last 168 hours.  Invalid input(s): LABALBU No results found for: CKTOTAL, CKMB, CKMBINDEX, TROPONINI  Lab Results  Component Value Date   CHOL 110 04/30/2023   CHOL 136 04/24/2022   CHOL 132 02/06/2022   Lab Results  Component Value Date   HDL 42.10 04/30/2023   HDL 44.30 04/24/2022   HDL 44 02/06/2022   Lab Results  Component Value Date   LDLCALC 55 04/30/2023   LDLCALC 77 04/24/2022   LDLCALC 72 02/06/2022   Lab Results  Component Value Date   TRIG 67.0 04/30/2023   TRIG 74.0 04/24/2022   TRIG 79 02/06/2022   Lab Results  Component Value Date   CHOLHDL 3 04/30/2023   CHOLHDL 3 04/24/2022   CHOLHDL 3.0 02/06/2022   Lab Results  Component Value Date   LDLDIRECT 169.0 03/09/2016   LDLDIRECT 87.0 02/27/2014   LDLDIRECT 59.3 12/29/2013      Radiology: No results found.  EKG: SR rate 71 RBBB 09/28/22 04/14/2024 NSR no RBBB normal    ASSESSMENT AND PLAN:   ASD:  history of repair intact with negative bubble and normal RV/LV function by TTE 07/24/21  Palpitations:  at risk for atrial arrhythmias given history of ASD consider monitor in future if patient has more palpitations HTN:   continue Hyzaar   DM:  Discussed low carb diet.  Target hemoglobin A1c is 6.5 or less.  Continue current medications. Most recent still elevated at 7.9 on 03/14/24 Only on glucophage  F/U primary consider adding Jardiance did not tolerate glipizide   HLD:  on statin high calcium  score for age LDL 4 with normal LFTls 04/30/23  Anxiety/Depression:  continue Lexapro   RBBB:  new 09/28/22  discussed with patient Yearly ECG ECG today no RBBB present    F/U in a year Needs to see primary to follow DM, HTN, HLD closely   Signed: Maude Emmer 04/14/2024, 8:49 AM

## 2024-04-06 ENCOUNTER — Other Ambulatory Visit: Payer: Self-pay | Admitting: Primary Care

## 2024-04-06 DIAGNOSIS — F411 Generalized anxiety disorder: Secondary | ICD-10-CM

## 2024-04-14 ENCOUNTER — Encounter: Payer: Self-pay | Admitting: Cardiovascular Disease

## 2024-04-14 ENCOUNTER — Ambulatory Visit: Attending: Cardiovascular Disease | Admitting: Cardiovascular Disease

## 2024-04-14 VITALS — BP 140/70 | HR 67 | Ht 64.0 in | Wt 175.0 lb

## 2024-04-14 DIAGNOSIS — I4510 Unspecified right bundle-branch block: Secondary | ICD-10-CM | POA: Diagnosis not present

## 2024-04-14 DIAGNOSIS — Q211 Atrial septal defect, unspecified: Secondary | ICD-10-CM

## 2024-04-14 DIAGNOSIS — Z136 Encounter for screening for cardiovascular disorders: Secondary | ICD-10-CM | POA: Diagnosis not present

## 2024-04-14 DIAGNOSIS — E785 Hyperlipidemia, unspecified: Secondary | ICD-10-CM

## 2024-04-14 DIAGNOSIS — I10 Essential (primary) hypertension: Secondary | ICD-10-CM | POA: Diagnosis not present

## 2024-04-14 MED ORDER — ROSUVASTATIN CALCIUM 20 MG PO TABS: 20.0000 mg | ORAL_TABLET | Freq: Every day | ORAL | 3 refills | Status: AC

## 2024-04-14 MED ORDER — LOSARTAN POTASSIUM-HCTZ 100-12.5 MG PO TABS: 1.0000 | ORAL_TABLET | Freq: Every day | ORAL | 3 refills | Status: AC

## 2024-04-14 NOTE — Patient Instructions (Signed)
 Medication Instructions:  Your physician recommends that you continue on your current medications as directed. Please refer to the Current Medication list given to you today.  *If you need a refill on your cardiac medications before your next appointment, please call your pharmacy*  Lab Work: NONE ordered at this time of appointment   Testing/Procedures: NONE ordered at this time of appointment    Follow-Up: At [facility], you and your health needs are our priority.  As part of our continuing mission to provide you with exceptional heart care, our providers are all part of one team.  This team includes your primary Cardiologist (physician) and Advanced Practice Providers or APPs (Physician Assistants and Nurse Practitioners) who all work together to provide you with the care you need, when you need it.  Your next appointment:   1 year(s)  Provider:   Janelle Mediate, MD    We recommend signing up for the patient portal called "MyChart".  Sign up information is provided on this After Visit Summary.  MyChart is used to connect with patients for Virtual Visits (Telemedicine).  Patients are able to view lab/test results, encounter notes, upcoming appointments, etc.  Non-urgent messages can be sent to your provider as well.   To learn more about what you can do with MyChart, go to [email or web address].   Other Instructions

## 2024-04-15 ENCOUNTER — Other Ambulatory Visit: Payer: Self-pay | Admitting: Podiatry

## 2024-04-15 DIAGNOSIS — B351 Tinea unguium: Secondary | ICD-10-CM

## 2024-04-25 ENCOUNTER — Encounter: Payer: BC Managed Care – PPO | Admitting: Dermatology

## 2024-05-11 ENCOUNTER — Ambulatory Visit: Admitting: Podiatry

## 2024-05-31 ENCOUNTER — Encounter: Admitting: Dermatology

## 2024-06-13 ENCOUNTER — Encounter: Admitting: Primary Care

## 2024-07-19 ENCOUNTER — Ambulatory Visit: Admitting: Sleep Medicine
